# Patient Record
Sex: Male | Born: 1935 | Race: White | Hispanic: No | Smoking: Former smoker
Health system: Southern US, Community
[De-identification: ages and names within clinical notes are randomized; demographics above are authoritative.]

## PROBLEM LIST (undated history)

## (undated) DIAGNOSIS — J189 Pneumonia, unspecified organism: Secondary | ICD-10-CM

## (undated) DIAGNOSIS — Z8489 Family history of other specified conditions: Secondary | ICD-10-CM

## (undated) DIAGNOSIS — L409 Psoriasis, unspecified: Secondary | ICD-10-CM

## (undated) DIAGNOSIS — I5030 Unspecified diastolic (congestive) heart failure: Secondary | ICD-10-CM

## (undated) DIAGNOSIS — H9190 Unspecified hearing loss, unspecified ear: Secondary | ICD-10-CM

## (undated) DIAGNOSIS — J948 Other specified pleural conditions: Secondary | ICD-10-CM

## (undated) DIAGNOSIS — E440 Moderate protein-calorie malnutrition: Secondary | ICD-10-CM

## (undated) DIAGNOSIS — I639 Cerebral infarction, unspecified: Secondary | ICD-10-CM

## (undated) DIAGNOSIS — I739 Peripheral vascular disease, unspecified: Secondary | ICD-10-CM

## (undated) DIAGNOSIS — J449 Chronic obstructive pulmonary disease, unspecified: Secondary | ICD-10-CM

## (undated) DIAGNOSIS — N400 Enlarged prostate without lower urinary tract symptoms: Secondary | ICD-10-CM

## (undated) DIAGNOSIS — I10 Essential (primary) hypertension: Secondary | ICD-10-CM

## (undated) DIAGNOSIS — F329 Major depressive disorder, single episode, unspecified: Secondary | ICD-10-CM

## (undated) DIAGNOSIS — F32A Depression, unspecified: Secondary | ICD-10-CM

## (undated) DIAGNOSIS — I509 Heart failure, unspecified: Secondary | ICD-10-CM

## (undated) DIAGNOSIS — I951 Orthostatic hypotension: Secondary | ICD-10-CM

## (undated) DIAGNOSIS — E785 Hyperlipidemia, unspecified: Secondary | ICD-10-CM

## (undated) HISTORY — DX: Moderate protein-calorie malnutrition: E44.0

## (undated) HISTORY — DX: Heart failure, unspecified: I50.9

## (undated) HISTORY — DX: Other specified pleural conditions: J94.8

## (undated) HISTORY — DX: Chronic obstructive pulmonary disease, unspecified: J44.9

## (undated) HISTORY — PX: HERNIA REPAIR: SHX51

## (undated) HISTORY — DX: Peripheral vascular disease, unspecified: I73.9

## (undated) HISTORY — DX: Hyperlipidemia, unspecified: E78.5

## (undated) HISTORY — DX: Cerebral infarction, unspecified: I63.9

---

## 2005-10-25 ENCOUNTER — Ambulatory Visit: Payer: Self-pay | Admitting: Internal Medicine

## 2005-12-05 ENCOUNTER — Ambulatory Visit: Payer: Self-pay | Admitting: Vascular Surgery

## 2010-04-13 ENCOUNTER — Observation Stay: Payer: Self-pay | Admitting: Internal Medicine

## 2013-01-02 ENCOUNTER — Ambulatory Visit: Payer: Self-pay | Admitting: Ophthalmology

## 2013-01-02 LAB — CREATININE, SERUM
Creatinine: 0.84 mg/dL (ref 0.60–1.30)
EGFR (Non-African Amer.): >60

## 2017-06-03 DIAGNOSIS — F32A Depression, unspecified: Secondary | ICD-10-CM | POA: Insufficient documentation

## 2017-06-03 DIAGNOSIS — L409 Psoriasis, unspecified: Secondary | ICD-10-CM | POA: Insufficient documentation

## 2017-06-03 DIAGNOSIS — F329 Major depressive disorder, single episode, unspecified: Secondary | ICD-10-CM | POA: Insufficient documentation

## 2017-06-03 DIAGNOSIS — R350 Frequency of micturition: Secondary | ICD-10-CM

## 2017-06-03 DIAGNOSIS — K591 Functional diarrhea: Secondary | ICD-10-CM | POA: Insufficient documentation

## 2017-06-03 DIAGNOSIS — N401 Enlarged prostate with lower urinary tract symptoms: Secondary | ICD-10-CM | POA: Insufficient documentation

## 2017-08-14 ENCOUNTER — Emergency Department: Payer: Medicare PPO

## 2017-08-14 ENCOUNTER — Inpatient Hospital Stay: Payer: Medicare PPO

## 2017-08-14 ENCOUNTER — Inpatient Hospital Stay
Admission: EM | Admit: 2017-08-14 | Discharge: 2017-08-15 | DRG: 066 | Disposition: A | Discharge status: Home / Self Care | Payer: Medicare PPO | Attending: Internal Medicine | Admitting: Internal Medicine

## 2017-08-14 ENCOUNTER — Inpatient Hospital Stay (HOSPITAL_COMMUNITY)
Admit: 2017-08-14 | Discharge: 2017-08-14 | Disposition: A | Discharge status: Home / Self Care | Payer: Medicare PPO | Attending: Internal Medicine | Admitting: Internal Medicine

## 2017-08-14 ENCOUNTER — Other Ambulatory Visit: Payer: Self-pay

## 2017-08-14 DIAGNOSIS — I351 Nonrheumatic aortic (valve) insufficiency: Secondary | ICD-10-CM | POA: Diagnosis not present

## 2017-08-14 DIAGNOSIS — Z823 Family history of stroke: Secondary | ICD-10-CM | POA: Diagnosis not present

## 2017-08-14 DIAGNOSIS — I639 Cerebral infarction, unspecified: Secondary | ICD-10-CM | POA: Diagnosis present

## 2017-08-14 DIAGNOSIS — N400 Enlarged prostate without lower urinary tract symptoms: Secondary | ICD-10-CM | POA: Diagnosis present

## 2017-08-14 DIAGNOSIS — I679 Cerebrovascular disease, unspecified: Secondary | ICD-10-CM

## 2017-08-14 DIAGNOSIS — R29810 Facial weakness: Secondary | ICD-10-CM | POA: Diagnosis present

## 2017-08-14 DIAGNOSIS — I63232 Cerebral infarction due to unspecified occlusion or stenosis of left carotid arteries: Secondary | ICD-10-CM | POA: Diagnosis present

## 2017-08-14 DIAGNOSIS — L409 Psoriasis, unspecified: Secondary | ICD-10-CM | POA: Diagnosis present

## 2017-08-14 DIAGNOSIS — R4701 Aphasia: Secondary | ICD-10-CM | POA: Diagnosis present

## 2017-08-14 DIAGNOSIS — Z87891 Personal history of nicotine dependence: Secondary | ICD-10-CM | POA: Diagnosis not present

## 2017-08-14 DIAGNOSIS — I10 Essential (primary) hypertension: Secondary | ICD-10-CM | POA: Diagnosis present

## 2017-08-14 DIAGNOSIS — I6789 Other cerebrovascular disease: Secondary | ICD-10-CM | POA: Diagnosis not present

## 2017-08-14 DIAGNOSIS — E785 Hyperlipidemia, unspecified: Secondary | ICD-10-CM | POA: Diagnosis not present

## 2017-08-14 DIAGNOSIS — R29702 NIHSS score 2: Secondary | ICD-10-CM | POA: Diagnosis present

## 2017-08-14 DIAGNOSIS — R471 Dysarthria and anarthria: Secondary | ICD-10-CM | POA: Diagnosis present

## 2017-08-14 DIAGNOSIS — I6522 Occlusion and stenosis of left carotid artery: Secondary | ICD-10-CM | POA: Diagnosis not present

## 2017-08-14 HISTORY — DX: Benign prostatic hyperplasia without lower urinary tract symptoms: N40.0

## 2017-08-14 HISTORY — DX: Cerebral infarction, unspecified: I63.9

## 2017-08-14 HISTORY — DX: Psoriasis, unspecified: L40.9

## 2017-08-14 HISTORY — DX: Essential (primary) hypertension: I10

## 2017-08-14 LAB — PROTIME-INR
INR: 1.08
Prothrombin Time: 13.9 seconds (ref 11.4–15.2)

## 2017-08-14 LAB — COMPREHENSIVE METABOLIC PANEL
ALK PHOS: 88 U/L (ref 38–126)
ALT: 8 U/L — AB (ref 17–63)
AST: 13 U/L — AB (ref 15–41)
Albumin: 3.8 g/dL (ref 3.5–5.0)
Anion gap: 7 (ref 5–15)
BILIRUBIN TOTAL: 0.4 mg/dL (ref 0.3–1.2)
BUN: 17 mg/dL (ref 6–20)
CO2: 25 mmol/L (ref 22–32)
CREATININE: 0.86 mg/dL (ref 0.61–1.24)
Calcium: 8.9 mg/dL (ref 8.9–10.3)
Chloride: 104 mmol/L (ref 101–111)
GFR calc Af Amer: >60 mL/min (ref >60)
Glucose, Bld: 119 mg/dL — ABNORMAL HIGH (ref 65–99)
Potassium: 3.5 mmol/L (ref 3.5–5.1)
Sodium: 136 mmol/L (ref 135–145)
TOTAL PROTEIN: 7.6 g/dL (ref 6.5–8.1)

## 2017-08-14 LAB — URINE DRUG SCREEN, QUALITATIVE (ARMC ONLY)
Amphetamines, Ur Screen: NOT DETECTED
BENZODIAZEPINE, UR SCRN: NOT DETECTED
Barbiturates, Ur Screen: NOT DETECTED
CANNABINOID 50 NG, UR ~~LOC~~: NOT DETECTED
Cocaine Metabolite,Ur ~~LOC~~: NOT DETECTED
MDMA (Ecstasy)Ur Screen: NOT DETECTED
Methadone Scn, Ur: NOT DETECTED
Opiate, Ur Screen: NOT DETECTED
PHENCYCLIDINE (PCP) UR S: NOT DETECTED
Tricyclic, Ur Screen: NOT DETECTED

## 2017-08-14 LAB — DIFFERENTIAL
BASOS ABS: 0.1 10*3/uL (ref 0–0.1)
Basophils Relative: 1 %
EOS ABS: 0.1 10*3/uL (ref 0–0.7)
Eosinophils Relative: 2 %
LYMPHS ABS: 0.8 10*3/uL — AB (ref 1.0–3.6)
Lymphocytes Relative: 12 %
MONOS PCT: 9 %
Monocytes Absolute: 0.6 10*3/uL (ref 0.2–1.0)
NEUTROS ABS: 5.3 10*3/uL (ref 1.4–6.5)
Neutrophils Relative %: 76 %

## 2017-08-14 LAB — URINALYSIS, ROUTINE W REFLEX MICROSCOPIC
BILIRUBIN URINE: NEGATIVE
GLUCOSE, UA: NEGATIVE mg/dL
HGB URINE DIPSTICK: NEGATIVE
Ketones, ur: NEGATIVE mg/dL
Leukocytes, UA: NEGATIVE
Nitrite: NEGATIVE
PROTEIN: NEGATIVE mg/dL
SPECIFIC GRAVITY, URINE: 1.017 (ref 1.005–1.030)
pH: 6 (ref 5.0–8.0)

## 2017-08-14 LAB — TROPONIN I

## 2017-08-14 LAB — CBC
HEMATOCRIT: 36.9 % — AB (ref 40.0–52.0)
HEMOGLOBIN: 12.4 g/dL — AB (ref 13.0–18.0)
MCH: 30.6 pg (ref 26.0–34.0)
MCHC: 33.7 g/dL (ref 32.0–36.0)
MCV: 90.8 fL (ref 80.0–100.0)
Platelets: 296 10*3/uL (ref 150–440)
RBC: 4.06 MIL/uL — ABNORMAL LOW (ref 4.40–5.90)
RDW: 16.1 % — ABNORMAL HIGH (ref 11.5–14.5)
WBC: 6.9 10*3/uL (ref 3.8–10.6)

## 2017-08-14 LAB — APTT: APTT: 42 s — AB (ref 24–36)

## 2017-08-14 LAB — ETHANOL

## 2017-08-14 MED ORDER — ONDANSETRON HCL 4 MG/2ML IJ SOLN: 4.0000 mg | Freq: Four times a day (QID) | INTRAMUSCULAR | Status: DC | PRN

## 2017-08-14 MED ORDER — SODIUM CHLORIDE 0.9 % IV SOLN: 250.0000 mL | INTRAVENOUS | Status: DC | PRN

## 2017-08-14 MED ORDER — ASPIRIN 81 MG PO CHEW
324.0000 mg | CHEWABLE_TABLET | Freq: Once | ORAL | Status: AC
Start: 2017-08-14 — End: 2017-08-14
  Administered 2017-08-14: 324 mg via ORAL
  Filled 2017-08-14: qty 4

## 2017-08-14 MED ORDER — ENOXAPARIN SODIUM 40 MG/0.4ML ~~LOC~~ SOLN
40.0000 mg | SUBCUTANEOUS | Status: DC
  Filled 2017-08-14: qty 0.4

## 2017-08-14 MED ORDER — POLYETHYLENE GLYCOL 3350 17 G PO PACK: 17.0000 g | PACK | Freq: Every day | ORAL | Status: DC | PRN

## 2017-08-14 MED ORDER — HYDRALAZINE HCL 20 MG/ML IJ SOLN: 10.0000 mg | Freq: Four times a day (QID) | INTRAMUSCULAR | Status: DC | PRN

## 2017-08-14 MED ORDER — ACETAMINOPHEN 650 MG RE SUPP: 650.0000 mg | Freq: Four times a day (QID) | RECTAL | Status: DC | PRN

## 2017-08-14 MED ORDER — SODIUM CHLORIDE 0.9% FLUSH
3.0000 mL | Freq: Two times a day (BID) | INTRAVENOUS | Status: DC
  Administered 2017-08-14 – 2017-08-15 (×3): 3 mL via INTRAVENOUS

## 2017-08-14 MED ORDER — ASPIRIN EC 81 MG PO TBEC
81.0000 mg | DELAYED_RELEASE_TABLET | Freq: Every day | ORAL | Status: DC
  Administered 2017-08-15: 09:00:00 81 mg via ORAL
  Filled 2017-08-14: qty 1

## 2017-08-14 MED ORDER — ACETAMINOPHEN 325 MG PO TABS: 650.0000 mg | ORAL_TABLET | Freq: Four times a day (QID) | ORAL | Status: DC | PRN

## 2017-08-14 MED ORDER — ONDANSETRON HCL 4 MG PO TABS: 4.0000 mg | ORAL_TABLET | Freq: Four times a day (QID) | ORAL | Status: DC | PRN

## 2017-08-14 MED ORDER — ATORVASTATIN CALCIUM 20 MG PO TABS
40.0000 mg | ORAL_TABLET | Freq: Every day | ORAL | Status: DC
Start: 2017-08-14 — End: 2017-08-15
  Administered 2017-08-14 – 2017-08-15 (×2): 40 mg via ORAL
  Filled 2017-08-14 (×2): qty 2

## 2017-08-14 MED ORDER — ALBUTEROL SULFATE (2.5 MG/3ML) 0.083% IN NEBU: 2.5000 mg | INHALATION_SOLUTION | RESPIRATORY_TRACT | Status: DC | PRN

## 2017-08-14 MED ORDER — SODIUM CHLORIDE 0.9% FLUSH: 3.0000 mL | INTRAVENOUS | Status: DC | PRN

## 2017-08-14 NOTE — ED Provider Notes (Signed)
[facility] Emergency Department Provider Note       Time seen: ----------------------------------------- 7:25 AM on 08/14/2017 -----------------------------------------   I have reviewed the triage vital signs and the nursing notes.  HISTORY   Chief Complaint Aphasia   HPI Martin Bean is a 83 y.o. male with a history of BPH who presents to the ED for not feeling well with slurred speech and some facial drooping.  Patient states he has had difficulty tying his shoes.  He denies any numbness.  Patient has never had a problem like this before.  He notes he went to bed about 1030 last night feeling normal.  When he woke up he was having difficulty talking and family noticed right-sided facial droop.  Past Medical History:  Diagnosis Date  . Prostate enlargement     There are no active problems to display for this patient.   Past Surgical History:  Procedure Laterality Date  . HERNIA REPAIR      Allergies Patient has no allergy information on record.  Social History Social History   Tobacco Use  . Smoking status: Former Games developermoker  . Smokeless tobacco: Never Used  Substance Use Topics  . Alcohol use: No    Frequency: Never  . Drug use: No    Review of Systems Constitutional: Negative for fever. Cardiovascular: Negative for chest pain. Respiratory: Negative for shortness of breath. Gastrointestinal: Negative for abdominal pain, vomiting and diarrhea. Musculoskeletal: Negative for back pain. Skin: Negative for rash. Neurological: Positive for facial droop and difficulty speaking  All systems negative/normal/unremarkable except as stated in the HPI  ____________________________________________   PHYSICAL EXAM:  VITAL SIGNS: ED Triage Vitals  Enc Vitals Group     BP 08/14/17 0719 (!) 195/84     Pulse Rate 08/14/17 0719 64     Resp 08/14/17 0719 17     Temp 08/14/17 0722 (!) 97.4 F (36.3 C)     Temp Source 08/14/17 0719 Oral   SpO2 08/14/17 0719 100 %     Weight 08/14/17 0719 150 lb (68 kg)     Height 08/14/17 0719 5\' 11"  (1.803 m)     Head Circumference --      Peak Flow --      Pain Score --      Pain Loc --      Pain Edu? --      Excl. in GC? --     Constitutional: Alert and oriented. Well appearing and in no distress. Eyes: Conjunctivae are normal. Normal extraocular movements. ENT   Head: Normocephalic and atraumatic.   Nose: No congestion/rhinnorhea.   Mouth/Throat: Mucous membranes are moist.   Neck: No stridor. Cardiovascular: Normal rate, regular rhythm. No murmurs, rubs, or gallops. Respiratory: Normal respiratory effort without tachypnea nor retractions. Breath sounds are clear and equal bilaterally. No wheezes/rales/rhonchi. Gastrointestinal: Soft and nontender. Normal bowel sounds Musculoskeletal: Nontender with normal range of motion in extremities. No lower extremity tenderness nor edema. Neurologic: Dysarthria is noted, right-sided facial droop is noted that spares the forehead.  Otherwise strength, sensation and the remainder of the cranial nerves are intact.  Cerebellar function appears to be normal. Skin:  Skin is warm, dry and intact. No rash noted. Psychiatric: Mood and affect are normal. Speech and behavior are normal.  ____________________________________________  EKG: Interpreted by me.  Sinus rhythm the rate of 71 bpm, right bundle branch block, wide QRS, normal QT.  ____________________________________________  ED COURSE:  As part of my medical decision making,  I reviewed the following data within the electronic MEDICAL RECORD NUMBER History obtained from family if available, nursing notes, old chart and ekg, as well as notes from prior ED visits. Patient presented for likely CVA noticed upon awakening this morning, we will assess with labs and imaging as indicated at this time.   Procedures ____________________________________________   LABS (pertinent  positives/negatives)  Labs Reviewed  APTT - Abnormal; Notable for the following components:      Result Value   aPTT 42 (*)    All other components within normal limits  CBC - Abnormal; Notable for the following components:   RBC 4.06 (*)    Hemoglobin 12.4 (*)    HCT 36.9 (*)    RDW 16.1 (*)    All other components within normal limits  DIFFERENTIAL - Abnormal; Notable for the following components:   Lymphs Abs 0.8 (*)    All other components within normal limits  PROTIME-INR  COMPREHENSIVE METABOLIC PANEL  TROPONIN I  URINE DRUG SCREEN, QUALITATIVE ([facility] ONLY)  URINALYSIS, ROUTINE W REFLEX MICROSCOPIC  ETHANOL    RADIOLOGY Images were viewed by me  CT head  IMPRESSION: 1. Progressed small vessel disease since the 2014 brain MRI, including age indeterminate changes in the cerebral white matter and right thalamus. 2. No acute intracranial hemorrhage or acute cortically based infarct identified. ASPECTS is 10. 3. 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 ____________________________________________  CRITICAL CARE Performed by: Emily FilbertWilliams, Jonathan E   Total critical care time: 30 minutes  Critical care time was exclusive of separately billable procedures and treating other patients.  Critical care was necessary to treat or prevent imminent or life-threatening deterioration.  Critical care was time spent personally by me on the following activities: development of treatment plan with patient and/or surrogate as well as nursing, discussions with consultants, evaluation of patient's response to treatment, examination of patient, obtaining history from patient or surrogate, ordering and performing treatments and interventions, ordering and review of laboratory studies, ordering and review of radiographic studies, pulse oximetry and re-evaluation of patient's condition.   DIFFERENTIAL DIAGNOSIS   Ischemic versus hemorrhagic CVA, seizure  FINAL  ASSESSMENT AND PLAN  CVA   Plan: Patient had presented for o'clock symptoms noted upon awakening this morning. Patient's labs were reassuring. Patient's imaging revealed some small vessel disease but no other acute process.  On reexamination his dysarthria is improving but he has persistent right-sided facial drooping.  No other focal neurologic deficits are appreciated.  This likely represents a CVA that has resolved somewhat or a TIA.  He was given aspirin, and he will need a full stroke workup.  I will discussed with the hospitalist for admission.   Emily FilbertWilliams, Jonathan E, MD   Note: This note was generated in part or whole with voice recognition software. Voice recognition is usually quite accurate but there are transcription errors that can and very often do occur. I apologize for any typographical errors that were not detected and corrected.     Emily FilbertWilliams, Jonathan E, MD 08/14/17 [phone]

## 2017-08-14 NOTE — ED Notes (Signed)
Patient denies pain and is resting comfortably.  

## 2017-08-14 NOTE — ED Notes (Signed)
ED Provider at bedside. 

## 2017-08-14 NOTE — ED Notes (Signed)
Patient transported to Ultrasound 

## 2017-08-14 NOTE — ED Notes (Signed)
CODE STROKE CALLED TO 333 

## 2017-08-14 NOTE — ED Notes (Signed)
Patient transported to CT 

## 2017-08-14 NOTE — ED Notes (Signed)
Pt family asked about bringing food to pt. Doctor advised it was ok. RN said pt passed swallow screen.  Lm edt

## 2017-08-14 NOTE — H&P (Signed)
[facility] [place] [facility]   PATIENT NAME: Martin DiceJasper Bean    MR#:  [id number]  DATE OF BIRTH:  03/26/1930  DATE OF ADMISSION:  08/14/2017  PRIMARY CARE PHYSICIAN: [name], [name], MD   REQUESTING/REFERRING PHYSICIAN: Dr. Mayford KnifeWilliams  CHIEF COMPLAINT:   Chief Complaint  Patient presents with  . Aphasia    HISTORY OF PRESENT ILLNESS:  [name] is a 83 y.o. male with a history of BPH who presents to the ED for not feeling well with slurred speech and some facial drooping. No focal weakness  He denies any numbness.  Patient has never had a problem like this before.   Went to bed 1030 PM.  Family noticed right-sided facial droop and difficulty talking on walking up today morning  PAST MEDICAL HISTORY:   Past Medical History:  Diagnosis Date  . HTN (hypertension)   . Prostate enlargement   . Psoriasis     PAST SURGICAL HISTORY:   Past Surgical History:  Procedure Laterality Date  . HERNIA REPAIR      SOCIAL HISTORY:   Social History   Tobacco Use  . Smoking status: Former Games developermoker  . Smokeless tobacco: Never Used  Substance Use Topics  . Alcohol use: No    Frequency: Never    FAMILY HISTORY:   Family History  Problem Relation Age of Onset  . Stroke Mother     DRUG ALLERGIES:  Not on File  REVIEW OF SYSTEMS:   Review of Systems  Constitutional: Positive for malaise/fatigue. Negative for chills, fever and weight loss.  HENT: Negative for hearing loss and nosebleeds.   Eyes: Negative for blurred vision, double vision and pain.  Respiratory: Negative for cough, hemoptysis, sputum production, shortness of breath and wheezing.   Cardiovascular: Negative for chest pain, palpitations, orthopnea and leg swelling.  Gastrointestinal: Negative for abdominal pain, constipation, diarrhea, nausea and vomiting.  Genitourinary: Negative for dysuria and hematuria.  Musculoskeletal: Negative for back pain, falls and myalgias.  Skin: Negative  for rash.  Neurological: Positive for speech change and weakness. Negative for dizziness, tremors, sensory change, focal weakness, seizures and headaches.  Endo/Heme/Allergies: Does not bruise/bleed easily.  Psychiatric/Behavioral: Negative for depression and memory loss. The patient is not nervous/anxious.     MEDICATIONS AT HOME:   Prior to Admission medications   Medication Sig Start Date End Date Taking? Authorizing Provider  Apremilast (OTEZLA) 30 MG TABS Take 1 tablet by mouth 2 (two) times daily.   Yes [provider]  sertraline (ZOLOFT) 50 MG tablet Take 1 tablet by mouth daily. 08/02/17  Yes [provider]  tamsulosin (FLOMAX) 0.4 MG CAPS capsule Take 1 capsule by mouth daily. 06/03/17  Yes [provider]  triamcinolone cream (KENALOG) 0.1 % Apply 0.1 % topically as needed.    [provider]     VITAL SIGNS:  Blood pressure (!) 189/80, pulse 61, temperature (!) 97.4 F (36.3 C), resp. rate (!) 22, height 5\' 11"  (1.803 m), weight 68 kg (150 lb), SpO2 98 %.  PHYSICAL EXAMINATION:  Physical Exam  GENERAL:  83 y.o.-year-old patient lying in the bed with no acute distress.  EYES: Pupils equal, round, reactive to light and accommodation. No scleral icterus. Extraocular muscles intact.  HEENT: Head atraumatic, normocephalic. Oropharynx and nasopharynx clear. No oropharyngeal erythema, moist oral mucosa  NECK:  Supple, no jugular venous distention. No thyroid enlargement, no tenderness.  LUNGS: Normal breath sounds bilaterally, no wheezing, rales, rhonchi. No use of  accessory muscles of respiration.  CARDIOVASCULAR: S1, S2 normal. No murmurs, rubs, or gallops.  ABDOMEN: Soft, nontender, nondistended. Bowel sounds present. No organomegaly or mass.  EXTREMITIES: No pedal edema, cyanosis, or clubbing. + 2 pedal & radial pulses b/l.   NEUROLOGIC: Cranial nerves II through XII are intact except right facial droop. No focal Motor or sensory deficits  appreciated b/l PSYCHIATRIC: The patient is alert and oriented x 3. Good affect.  SKIN: No obvious rash, lesion, or ulcer.   LABORATORY PANEL:   CBC Recent Labs  Lab 08/14/17 0730  WBC 6.9  HGB 12.4*  HCT 36.9*  PLT 296   ------------------------------------------------------------------------------------------------------------------  Chemistries  Recent Labs  Lab 08/14/17 0730  NA 136  K 3.5  CL 104  CO2 25  GLUCOSE 119*  BUN 17  CREATININE 0.86  CALCIUM 8.9  AST 13*  ALT 8*  ALKPHOS 88  BILITOT 0.4   ------------------------------------------------------------------------------------------------------------------  Cardiac Enzymes Recent Labs  Lab 08/14/17 0730  TROPONINI <0.03   ------------------------------------------------------------------------------------------------------------------  RADIOLOGY:  Ct Head Code Stroke Wo Contrast  Addendum Date: 08/14/2017   ADDENDUM REPORT: 08/14/2017 08:05 ADDENDUM: Study discussed by telephone with Dr. Daryel NovemberJONATHAN WILLIAMS on 08/14/2017 at 0804 hours. Electronically Signed   By: Odessa FlemingH  Hall M.D.   On: 08/14/2017 08:05   Result Date: 08/14/2017 CLINICAL DATA:  Code stroke. [age] year old male who woke today with slurred speech and right facial droop. EXAM: CT HEAD WITHOUT CONTRAST TECHNIQUE: Contiguous axial images were obtained from the base of the skull through the vertex without intravenous contrast. COMPARISON:  Brain MRI 01/02/2013. FINDINGS: Brain: Chronic bilateral deep gray matter nuclei lacunar infarcts demonstrated in 2014. New hypodensity in the right thalamus since that time (series 2, image 12). Small chronic infarcts in both cerebellar hemispheres, more so the right, appear stable. No acute intracranial hemorrhage identified. No midline shift, mass effect, or evidence of intracranial mass lesion. No ventriculomegaly. Patchy bilateral cerebral white matter density appears progressed since 2014. No superimposed acute  cortically based infarct identified. Vascular: Calcified atherosclerosis at the skull base. No suspicious intracranial vascular hyperdensity. Skull: Negative. Sinuses/Orbits: Clear. Other: Visualized orbits and scalp soft tissues are within normal limits. ASPECTS [facility]([place] Stroke Program Early CT Score) - Ganglionic level infarction (caudate, lentiform nuclei, internal capsule, insula, M1-M3 cortex): 7 - Supraganglionic infarction (M4-M6 cortex): 3 Total score (0-10 with 10 being normal): 10 IMPRESSION: 1. Progressed small vessel disease since the 2014 brain MRI, including age indeterminate changes in the cerebral white matter and right thalamus. 2. No acute intracranial hemorrhage or acute cortically based infarct identified. ASPECTS is 10. 3. 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 Electronically Signed: By: Odessa FlemingH  Hall M.D. On: 08/14/2017 07:58     IMPRESSION AND PLAN:   * Acute CVA with right facial droop and dysarthria MRI/MRA head ECHO Carotid dopplers ASA, Lipitor Neurology/PT/OT/Speech consult Lipid panel Tele Beyond tPA window  * Hypertension Permissive HTN Not on home meds PRN meds for SBP>220/DBP>120  * DVT prophylaxis Lovenox  All the records are reviewed and case discussed with ED provider. Management plans discussed with the patient, family and they are in agreement.  CODE STATUS: FULL CODE  TOTAL TIME TAKING CARE OF THIS PATIENT: 40 minutes.   Orie FishermanSrikar R Mckennon Zwart M.D on 08/14/2017 at 9:37 AM  Between 7am to 6pm - Pager - [phone]  After 6pm go to [email or web address] - password [email or web address] [facility]  [facility]  Office  [phone]  CC: Primary care physician; [name], [name],  MD  Note: This dictation was prepared with Dragon dictation along with smaller phrase technology. Any transcriptional errors that result from this process are unintentional.

## 2017-08-14 NOTE — Evaluation (Signed)
Physical Therapy Evaluation Patient Details Name: Martin HaverJasper L Stancil MRN: [id number] DOB: 01/07/1930 Today's Date: 08/14/2017   History of Present Illness  Pt is an 83 y.o. male presenting to hospital with slurred speech and R facial droop.  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  PMH includes htn, prostate enlargement, hernia repair.  Clinical Impression  Prior to hospital admission, pt was independent with functional mobility.  Pt lives alone in 1 level home with 5-6 steps to enter with B railings.  Currently pt is independent with bed mobility, transfers, gait, and modified independent navigating 6 stairs with railing.  Pt scored 23/24 on DGI balance assessment (pt used railing for stairs) indicating pt is at low risk for falls.  B LE strength, light touch, proprioception, and tone WFL.  With smiling, mild decreased R elevation compared to L side (nursing notified; pt's daughter reports looking normal to her).  No acute PT needs identified during hospitalization or upon hospital discharge.  Will complete pt's current PT order and discharge pt from PT in house.  Please re-consult PT if pt's status changes and acute PT needs are identified.    Follow Up Recommendations No PT follow up    Equipment Recommendations  None recommended by PT    Recommendations for Other Services       Precautions / Restrictions Precautions Precautions: Fall Restrictions Weight Bearing Restrictions: No      Mobility  Bed Mobility Overal bed mobility: Independent             General bed mobility comments: Supine to/from sit without any difficulties.  Transfers Overall transfer level: Independent Equipment used: None             General transfer comment: steady strong transfers  Ambulation/Gait Ambulation/Gait assistance: Independent Ambulation Distance (Feet): 240 Feet Assistive device: None Gait Pattern/deviations: WFL(Within Functional Limits)   Gait  velocity interpretation: at or above normal speed for age/gender General Gait Details: steady ambulation  Stairs Stairs: Yes Stairs assistance: Modified independent (Device/Increase time) Stair Management: One rail Right;Alternating pattern;Forwards Number of Stairs: 6 General stair comments: steady safe stairs navigation  Wheelchair Mobility    Modified Rankin (Stroke Patients Only)       Balance Overall balance assessment: Independent                               Standardized Balance Assessment Standardized Balance Assessment : Dynamic Gait Index   Dynamic Gait Index Level Surface: Normal Change in Gait Speed: Normal Gait with Horizontal Head Turns: Normal Gait with Vertical Head Turns: Normal Gait and Pivot Turn: Normal Step Over Obstacle: Normal Step Around Obstacles: Normal Steps: Mild Impairment Total Score: 23       Pertinent Vitals/Pain Pain Assessment: No/denies pain  Vitals (HR and O2 on room air) stable and WFL throughout treatment session.  BP 164/74 post ambulation (nursing notified).    Home Living Family/patient expects to be discharged to:: Private residence Living Arrangements: Alone   Type of Home: House Home Access: Stairs to enter Entrance Stairs-Rails: Right;Left;Can reach both Entrance Stairs-Number of Steps: 5-6 Home Layout: One level Home Equipment: Grab bars - tub/shower      Prior Function Level of Independence: Independent         Comments: Pt reports stumbling about 1 month ago but no falls.  Independent with ADL's.     Hand Dominance  Extremity/Trunk Assessment   Upper Extremity Assessment Upper Extremity Assessment: Overall WFL for tasks assessed    Lower Extremity Assessment Lower Extremity Assessment: Overall WFL for tasks assessed(B LE strength 4+/5 hip flexion, knee flexion/extension, and DF.  Intact B LE proprioception and light touch.  Good B heel to shin.)    Cervical / Trunk  Assessment Cervical / Trunk Assessment: Normal  Communication   Communication: No difficulties  Cognition Arousal/Alertness: Awake/alert Behavior During Therapy: WFL for tasks assessed/performed Overall Cognitive Status: Within Functional Limits for tasks assessed                                        General Comments   Nursing cleared pt for participation in physical therapy.  Pt agreeable to PT session.  Pt's family member present during session.    Exercises     Assessment/Plan    PT Assessment Patent does not need any further PT services  PT Problem List         PT Treatment Interventions      PT Goals (Current goals can be found in the Care Plan section)  Acute Rehab PT Goals Patient Stated Goal: to go home PT Goal Formulation: With patient Time For Goal Achievement: 08/28/17 Potential to Achieve Goals: Good    Frequency     Barriers to discharge        Co-evaluation               AM-PAC PT "6 Clicks" Daily Activity  Outcome Measure Difficulty turning over in bed (including adjusting bedclothes, sheets and blankets)?: None Difficulty moving from lying on back to sitting on the side of the bed? : None Difficulty sitting down on and standing up from a chair with arms (e.g., wheelchair, bedside commode, etc,.)?: None Help needed moving to and from a bed to chair (including a wheelchair)?: None Help needed walking in hospital room?: None Help needed climbing 3-5 steps with a railing? : None 6 Click Score: 24    End of Session Equipment Utilized During Treatment: Gait belt Activity Tolerance: Patient tolerated treatment well Patient left: in bed;with call bell/phone within reach;with family/visitor present Nurse Communication: Mobility status;Precautions PT Visit Diagnosis: Muscle weakness (generalized) (M62.81);Other abnormalities of gait and mobility (R26.89)    Time: [id number] PT Time Calculation (min) (ACUTE ONLY): 25  min   Charges:   PT Evaluation $PT Eval Low Complexity: 1 Low     PT G Codes:   PT G-Codes **NOT FOR INPATIENT CLASS** Functional Assessment Tool Used: AM-PAC 6 Clicks Basic Mobility Functional Limitation: Mobility: Walking and moving around Mobility: Walking and Moving Around Current Status (M5784(G8978): 0 percent impaired, limited or restricted Mobility: Walking and Moving Around Goal Status (O9629(G8979): 0 percent impaired, limited or restricted Mobility: Walking and Moving Around Discharge Status (B2841(G8980): 0 percent impaired, limited or restricted    Hendricks LimesEmily Marajade Lei, PT 08/14/17, 4:43 PM [phone]

## 2017-08-14 NOTE — ED Triage Notes (Signed)
Pt states when he woke up this morning he didn't feel right, c/o slurred speech with some facial droop . States he had difficulty tying his shoes. Denies numbness. Pt ambulatory to triage without difficulty..Marland Kitchen

## 2017-08-15 ENCOUNTER — Inpatient Hospital Stay: Payer: Medicare PPO

## 2017-08-15 DIAGNOSIS — I639 Cerebral infarction, unspecified: Secondary | ICD-10-CM

## 2017-08-15 DIAGNOSIS — I6522 Occlusion and stenosis of left carotid artery: Secondary | ICD-10-CM

## 2017-08-15 DIAGNOSIS — I6789 Other cerebrovascular disease: Secondary | ICD-10-CM

## 2017-08-15 DIAGNOSIS — I10 Essential (primary) hypertension: Secondary | ICD-10-CM

## 2017-08-15 DIAGNOSIS — E785 Hyperlipidemia, unspecified: Secondary | ICD-10-CM

## 2017-08-15 LAB — LIPID PANEL
Cholesterol: 153 mg/dL (ref 0–200)
HDL: 42 mg/dL (ref >40)
LDL CALC: 100 mg/dL — AB (ref 0–99)
Total CHOL/HDL Ratio: 3.6 RATIO
Triglycerides: 55 mg/dL (ref <150)
VLDL: 11 mg/dL (ref 0–40)

## 2017-08-15 LAB — HEMOGLOBIN A1C
HEMOGLOBIN A1C: 6 % — AB (ref 4.8–5.6)
MEAN PLASMA GLUCOSE: 125.5 mg/dL

## 2017-08-15 LAB — ECHOCARDIOGRAM COMPLETE
Height: 71 in
Weight: 2300.8 oz

## 2017-08-15 MED ORDER — ENSURE ENLIVE PO LIQD: 237.0000 mL | Freq: Two times a day (BID) | ORAL | Status: DC

## 2017-08-15 MED ORDER — IOPAMIDOL (ISOVUE-370) INJECTION 76%
75.0000 mL | Freq: Once | INTRAVENOUS | Status: AC | PRN
Start: 2017-08-15 — End: 2017-08-15
  Administered 2017-08-15: 75 mL via INTRAVENOUS

## 2017-08-15 MED ORDER — CLOPIDOGREL BISULFATE 75 MG PO TABS: 75.0000 mg | ORAL_TABLET | Freq: Every day | ORAL | 0 refills | Status: DC

## 2017-08-15 MED ORDER — ATORVASTATIN CALCIUM 40 MG PO TABS: 40.0000 mg | ORAL_TABLET | Freq: Every day | ORAL | 0 refills | Status: DC

## 2017-08-15 MED ORDER — ASPIRIN 81 MG PO TBEC: 81.0000 mg | DELAYED_RELEASE_TABLET | Freq: Every day | ORAL | Status: DC

## 2017-08-15 MED ORDER — CLOPIDOGREL BISULFATE 75 MG PO TABS
75.0000 mg | ORAL_TABLET | Freq: Every day | ORAL | Status: DC
  Administered 2017-08-15: 18:00:00 75 mg via ORAL
  Filled 2017-08-15: qty 1

## 2017-08-15 NOTE — Progress Notes (Signed)
Pt D/C per patient orders to home with family. Tele removed. IV removed intact. VSS.

## 2017-08-15 NOTE — Progress Notes (Signed)
SLP Cancellation Note  Patient Details Name: Martin Bean MRN: [id number] DOB: 1929-08-25   Cancelled treatment:       Reason Eval/Treat Not Completed: SLP screened, no needs identified, will sign off(chart reviewed; NSG consulted; met w/ pt/family).Pt denied any difficulty swallowing and is currently on a regular diet; chews his foods well d/t edentulous status. He tolerates swallowing pills w/ water per NSG. Pt conversed at conversational level w/out deficits noted; pt and family denied any speech-language deficits and stated he no longer has any facial/lip droop - "it all returned by this time yesterday", per Dtr.   No further skilled ST services indicated as pt appears at his baseline. Pt agreed. NSG to reconsult if any change in status.    Orinda Kenner, MS, CCC-SLP Watson,Katherine 08/15/2017, 9:03 AM

## 2017-08-15 NOTE — Consult Note (Addendum)
Referring Physician: Sudini    Chief Complaint: Difficulty with speech  HPI: Martin Bean is an 83 y.o. male who reports going to bed at baseline on 12/25.  Awakened early on yesterday and noted that he was having trouble with simple tasks such as putting on his shoes, etc.  When he tried to speak with family words were unintelligible.  Patient was brought in for evaluation at that time. Initial NIHSS of 2.    Date last known well: Date: 08/13/2017 Time last known well: Time: 22:30 tPA Given: No: Outside time window  Past Medical History:  Diagnosis Date  . HTN (hypertension)   . Prostate enlargement   . Psoriasis     Past Surgical History:  Procedure Laterality Date  . HERNIA REPAIR      Family History  Problem Relation Age of Onset  . Stroke Mother    Social History:  reports that he has quit smoking. he has never used smokeless tobacco. He reports that he does not drink alcohol or use drugs.  Allergies: Not on File  Medications:  I have reviewed the patient's current medications. Prior to Admission:  Medications Prior to Admission  Medication Sig Dispense Refill Last Dose  . Apremilast (OTEZLA) 30 MG TABS Take 1 tablet by mouth 2 (two) times daily.   08/13/2017 at Unknown time  . sertraline (ZOLOFT) 50 MG tablet Take 1 tablet by mouth daily.   08/12/2017 at Unknown time  . tamsulosin (FLOMAX) 0.4 MG CAPS capsule Take 1 capsule by mouth daily.   08/13/2017 at Unknown time  . triamcinolone cream (KENALOG) 0.1 % Apply 0.1 % topically as needed.   prn at prn   Scheduled: . aspirin EC  81 mg Oral Daily  . atorvastatin  40 mg Oral q1800  . enoxaparin (LOVENOX) injection  40 mg Subcutaneous Q24H  . sodium chloride flush  3 mL Intravenous Q12H    ROS: History obtained from the patient  General ROS: negative for - chills, fatigue, fever, night sweats, weight gain or weight loss Psychological ROS: negative for - behavioral disorder, hallucinations, memory difficulties,  mood swings or suicidal ideation Ophthalmic ROS: negative for - blurry vision, double vision, eye pain or loss of vision ENT ROS: negative for - epistaxis, nasal discharge, oral lesions, sore throat, tinnitus or vertigo Allergy and Immunology ROS: negative for - hives or itchy/watery eyes Hematological and Lymphatic ROS: negative for - bleeding problems, bruising or swollen lymph nodes Endocrine ROS: negative for - galactorrhea, hair pattern changes, polydipsia/polyuria or temperature intolerance Respiratory ROS: negative for - cough, hemoptysis, shortness of breath or wheezing Cardiovascular ROS: negative for - chest pain, dyspnea on exertion, edema or irregular heartbeat Gastrointestinal ROS: negative for - abdominal pain, diarrhea, hematemesis, nausea/vomiting or stool incontinence Genito-Urinary ROS: intermittent hematuria Musculoskeletal ROS: negative for - joint swelling or muscular weakness Neurological ROS: as noted in HPI Dermatological ROS: negative for rash and skin lesion changes  Physical Examination: Blood pressure (!) 182/84, pulse (!) 59, temperature 98.3 F (36.8 C), temperature source Oral, resp. rate 15, height 5\' 11"  (1.803 m), weight 64 kg (141 lb 1 oz), SpO2 96 %.  HEENT-  Normocephalic, no lesions, without obvious abnormality.  Normal external eye and conjunctiva.  Normal TM's bilaterally.  Normal auditory canals and external ears. Normal external nose, mucus membranes and septum.  Normal pharynx. Cardiovascular- S1, S2 normal, pulses palpable throughout   Lungs- chest clear, no wheezing, rales, normal symmetric air entry Abdomen- soft, non-tender; bowel sounds  normal; no masses,  no organomegaly Extremities- no edema Lymph-no adenopathy palpable Musculoskeletal-no joint tenderness, deformity or swelling Skin-warm and dry, no hyperpigmentation, vitiligo, or suspicious lesions  Neurological Examination   Mental Status: Alert, oriented, thought content appropriate.   Speech fluent with some mild word finding difficulties.  Able to follow 3 step commands without difficulty. Cranial Nerves: II: Discs flat bilaterally; Visual fields grossly normal, pupils equal, round, reactive to light and accommodation III,IV, VI: ptosis not present, extra-ocular motions intact bilaterally V,VII: mild right facial droop, facial light touch sensation normal bilaterally VIII: hearing normal bilaterally IX,X: gag reflex present XI: bilateral shoulder shrug XII: midline tongue extension Motor: Right : Upper extremity   5/5    Left:     Upper extremity   5/5  Lower extremity   5/5     Lower extremity   5/5 Tone and bulk:normal tone throughout; no atrophy noted Sensory: Pinprick and light touch intact throughout, bilaterally Deep Tendon Reflexes: 2+ and symmetric with absent AJ's bilaterally Plantars: Right: downgoing   Left: downgoing Cerebellar: Normal finger-to-nose, normal rapid alternating movements and normal heel-to-shin testing bilaterally Gait: not tested due to safety concerns    Laboratory Studies:  Basic Metabolic Panel: Recent Labs  Lab 08/14/17 0730  NA 136  K 3.5  CL 104  CO2 25  GLUCOSE 119*  BUN 17  CREATININE 0.86  CALCIUM 8.9    Liver Function Tests: Recent Labs  Lab 08/14/17 0730  AST 13*  ALT 8*  ALKPHOS 88  BILITOT 0.4  PROT 7.6  ALBUMIN 3.8   No results for input(s): LIPASE, AMYLASE in the last 168 hours. No results for input(s): AMMONIA in the last 168 hours.  CBC: Recent Labs  Lab 08/14/17 0730  WBC 6.9  NEUTROABS 5.3  HGB 12.4*  HCT 36.9*  MCV 90.8  PLT 296    Cardiac Enzymes: Recent Labs  Lab 08/14/17 0730  TROPONINI <0.03    BNP: Invalid input(s): POCBNP  CBG: No results for input(s): GLUCAP in the last 168 hours.  Microbiology: No results found for this or any previous visit.  Coagulation Studies: Recent Labs    08/14/17 0730  LABPROT 13.9  INR 1.08    Urinalysis:  Recent Labs  Lab  08/14/17 0730  COLORURINE YELLOW*  LABSPEC 1.017  PHURINE 6.0  GLUCOSEU NEGATIVE  HGBUR NEGATIVE  BILIRUBINUR NEGATIVE  KETONESUR NEGATIVE  PROTEINUR NEGATIVE  NITRITE NEGATIVE  LEUKOCYTESUR NEGATIVE    Lipid Panel:    Component Value Date/Time   CHOL 153 08/15/2017 0619   TRIG 55 08/15/2017 0619   HDL 42 08/15/2017 0619   CHOLHDL 3.6 08/15/2017 0619   VLDL 11 08/15/2017 0619   LDLCALC 100 (H) 08/15/2017 0619    HgbA1C: No results found for: HGBA1C  Urine Drug Screen:      Component Value Date/Time   LABOPIA NONE DETECTED 08/14/2017 0730   COCAINSCRNUR NONE DETECTED 08/14/2017 0730   LABBENZ NONE DETECTED 08/14/2017 0730   AMPHETMU NONE DETECTED 08/14/2017 0730   THCU NONE DETECTED 08/14/2017 0730   LABBARB NONE DETECTED 08/14/2017 0730    Alcohol Level:  Recent Labs  Lab 08/14/17 0730  ETH <10    Other results: EKG: sinus rhythm at 71 bpm.  Imaging: Mr Shirlee Latch ZO Contrast  Result Date: 08/14/2017 CLINICAL DATA:  83 year old male with ataxia. Woke today with slurred speech and right facial droop small vessel disease evident on head CT today which had progressed compared to a 2014 brain  MRI. EXAM: MRI HEAD WITHOUT CONTRAST MRA HEAD WITHOUT CONTRAST TECHNIQUE: Multiplanar, multiecho pulse sequences of the brain and surrounding structures were obtained without intravenous contrast. Angiographic images of the head were obtained using MRA technique without contrast. COMPARISON:  noncontrast head CT 0744 hours today. Brain MRI 01/02/2013. FINDINGS: MRI HEAD FINDINGS Brain: There is a small focus of cortical restricted diffusion in the left superior frontal gyrus at the motor strip (series 100, image 46 and series 101, image 19). Associated patchy FLAIR hyperintensity (series 13, image 46). Possible subtle cortical restriction also in the left parietal lobe on series 100, image 42. No other convincing restricted diffusion. No contralateral right hemisphere or posterior  fossa diffusion restriction. Chronic lacunar infarcts in the right corona radiata, right basal ganglia and deep white matter capsule, and right thalamus (corresponding to the right thalamic CT finding today). Small chronic infarcts in both cerebellar hemispheres are stable since 2014, more pronounced on the right. Patchy bilateral cerebral white matter T2 and FLAIR hyperintensity has progressed since 2014, including several areas in the right hemisphere which most resemble chronic white matter lacune is a (series 13, image 35). No midline shift, mass effect, evidence of mass lesion, ventriculomegaly, extra-axial collection or acute intracranial hemorrhage. Cervicomedullary junction and pituitary are within normal limits. Vascular: Major intracranial vascular flow voids are stable since 2014. Mild to moderate generalized intracranial artery tortuosity. Skull and upper cervical spine: Degenerative appearing ligamentous hypertrophy about the odontoid. Otherwise negative cervical spine. Normal bone marrow signal. Sinuses/Orbits: Normal orbits soft tissues. Chronic left maxillary sinusitis. Mild ethmoid mucosal thickening is also stable since 2014. Other: Visible internal auditory structures appear normal. Mastoids are stable and well pneumatized. Scalp and face soft tissues appear negative. MRA HEAD FINDINGS Antegrade flow in the posterior circulation. Mildly dominant distal right vertebral artery. Bilateral PICA flow signal appears normal. There is mild distal vertebral artery irregularity but no significant stenosis. Patent vertebrobasilar junction. No basilar stenosis. Patent SCA origins. The left PCA origin arises vertically at the basilar tip and the left P1 segment is tortuous. There is mild bilateral PCA irregularity, but preserved distal PCA flow. A small right posterior communicating artery is evident, the left is diminutive or absent. Antegrade flow in both ICA siphons. Mild to moderate siphon irregularity  maximal in the left vertical petrous segment, but no significant siphon stenosis. Patent carotid termini. Mild motion artifact at the MCA and ACA origins which remain patent. Visible ACA branches are normal aside from tortuosity. The right MCA M1 segment is tortuous. Right MCA bifurcation or trifurcation and visible right MCA branches are within normal limits. The left MCA M1 segment is patent. The left MCA bifurcation appears ectatic but patent. No left MCA branch occlusion is identified. IMPRESSION: 1. Two small acute cortical infarcts are identified in the posterior Left MCA territory, one is at the left motor strip which could explain right-sided weakness. No associated hemorrhage or mass effect. 2. Intracranial MRA reveals anterior and posterior circulation atherosclerosis, but there is no high-grade stenosis and no left MCA branch occlusion is identified. 3. Underlying chronic small vessel disease with progression in the right thalamus and the bilateral cerebral white matter since 2014. Electronically Signed   By: Odessa FlemingH  Hall M.D.   On: 08/14/2017 12:45   Mr Brain Wo Contrast  Result Date: 08/14/2017 CLINICAL DATA:  83 year old male with ataxia. Woke today with slurred speech and right facial droop small vessel disease evident on head CT today which had progressed compared to a 2014 brain  MRI. EXAM: MRI HEAD WITHOUT CONTRAST MRA HEAD WITHOUT CONTRAST TECHNIQUE: Multiplanar, multiecho pulse sequences of the brain and surrounding structures were obtained without intravenous contrast. Angiographic images of the head were obtained using MRA technique without contrast. COMPARISON:  noncontrast head CT 0744 hours today. Brain MRI 01/02/2013. FINDINGS: MRI HEAD FINDINGS Brain: There is a small focus of cortical restricted diffusion in the left superior frontal gyrus at the motor strip (series 100, image 46 and series 101, image 19). Associated patchy FLAIR hyperintensity (series 13, image 46). Possible subtle  cortical restriction also in the left parietal lobe on series 100, image 42. No other convincing restricted diffusion. No contralateral right hemisphere or posterior fossa diffusion restriction. Chronic lacunar infarcts in the right corona radiata, right basal ganglia and deep white matter capsule, and right thalamus (corresponding to the right thalamic CT finding today). Small chronic infarcts in both cerebellar hemispheres are stable since 01-19-13, more pronounced on the right. Patchy bilateral cerebral white matter T2 and FLAIR hyperintensity has progressed since 01/19/2013, including several areas in the right hemisphere which most resemble chronic white matter lacune is a (series 13, image 35). No midline shift, mass effect, evidence of mass lesion, ventriculomegaly, extra-axial collection or acute intracranial hemorrhage. Cervicomedullary junction and pituitary are within normal limits. Vascular: Major intracranial vascular flow voids are stable since 01-19-2013. Mild to moderate generalized intracranial artery tortuosity. Skull and upper cervical spine: Degenerative appearing ligamentous hypertrophy about the odontoid. Otherwise negative cervical spine. Normal bone marrow signal. Sinuses/Orbits: Normal orbits soft tissues. Chronic left maxillary sinusitis. Mild ethmoid mucosal thickening is also stable since 01/19/2013. Other: Visible internal auditory structures appear normal. Mastoids are stable and well pneumatized. Scalp and face soft tissues appear negative. MRA HEAD FINDINGS Antegrade flow in the posterior circulation. Mildly dominant distal right vertebral artery. Bilateral PICA flow signal appears normal. There is mild distal vertebral artery irregularity but no significant stenosis. Patent vertebrobasilar junction. No basilar stenosis. Patent SCA origins. The left PCA origin arises vertically at the basilar tip and the left P1 segment is tortuous. There is mild bilateral PCA irregularity, but preserved distal PCA flow. A  small right posterior communicating artery is evident, the left is diminutive or absent. Antegrade flow in both ICA siphons. Mild to moderate siphon irregularity maximal in the left vertical petrous segment, but no significant siphon stenosis. Patent carotid termini. Mild motion artifact at the MCA and ACA origins which remain patent. Visible ACA branches are normal aside from tortuosity. The right MCA M1 segment is tortuous. Right MCA bifurcation or trifurcation and visible right MCA branches are within normal limits. The left MCA M1 segment is patent. The left MCA bifurcation appears ectatic but patent. No left MCA branch occlusion is identified. IMPRESSION: 1. Two small acute cortical infarcts are identified in the posterior Left MCA territory, one is at the left motor strip which could explain right-sided weakness. No associated hemorrhage or mass effect. 2. Intracranial MRA reveals anterior and posterior circulation atherosclerosis, but there is no high-grade stenosis and no left MCA branch occlusion is identified. 3. Underlying chronic small vessel disease with progression in the right thalamus and the bilateral cerebral white matter since 01/19/2013. Electronically Signed   By: Odessa Fleming M.D.   On: 08/14/2017 12:45   US Carotid Bilateral  Result Date: 08/14/2017 CLINICAL DATA:  Cerebrovascular disease. Right-sided facial droop. Former smoker. EXAM: BILATERAL CAROTID DUPLEX ULTRASOUND TECHNIQUE: Wallace Cullens scale imaging, color Doppler and duplex ultrasound were performed of bilateral carotid and vertebral arteries in  the neck. COMPARISON:  None. FINDINGS: Criteria: Quantification of carotid stenosis is based on velocity parameters that correlate the residual internal carotid diameter with NASCET-based stenosis levels, using the diameter of the distal internal carotid lumen as the denominator for stenosis measurement. The following velocity measurements were obtained: RIGHT ICA:  82/18 cm/sec CCA:  72/10 cm/sec SYSTOLIC  ICA/CCA RATIO:  1.1 DIASTOLIC ICA/CCA RATIO:  1.7 ECA:  145 cm/sec LEFT ICA:  140/27 cm/sec CCA:  87/14 cm/sec SYSTOLIC ICA/CCA RATIO:  1.6 DIASTOLIC ICA/CCA RATIO:  1.9 ECA:  115 cm/sec RIGHT CAROTID ARTERY: There is a moderate amount of atherosclerotic plaque within the right carotid bulb (images 15 and 16), extending to involve the origin and proximal aspects of the right internal carotid artery (image 23), not resulting in elevated peak systolic velocities within the interrogated course the right internal carotid artery to suggest a hemodynamically significant stenosis. RIGHT VERTEBRAL ARTERY:  Antegrade flow LEFT CAROTID ARTERY: There is a minimal amount of intimal wall thickening involving the mid and distal aspects of the left common carotid artery (images 40 and 44). There is a moderate to large amount of atherosclerotic plaque within the left carotid bulb (images 47 and 48), extending to involve the origin and proximal aspects of the left internal carotid artery (image 55), resulting in elevated peak systolic velocities within the proximal and mid aspects of the left internal carotid artery. Greatest acquired peak systolic velocity within mid left ICA measures 140 cm/sec - image 60. LEFT VERTEBRAL ARTERY:  Antegrade Flow IMPRESSION: 1. Moderate to large amount of left-sided atherosclerotic plaque results in elevated peak systolic velocities within the left internal carotid artery compatible with the 50-69% luminal narrowing range. Further evaluation with CTA could be performed as clinically indicated. 2. Moderate amount of right-sided atherosclerotic plaque, not resulting in a hemodynamically significant stenosis. Electronically Signed   By: Simonne Come M.D.   On: 08/14/2017 11:20   Ct Head Code Stroke Wo Contrast  Addendum Date: 08/14/2017   ADDENDUM REPORT: 08/14/2017 08:05 ADDENDUM: Study discussed by telephone with Dr. Daryel November on 08/14/2017 at 0804 hours. Electronically Signed   By: Odessa Fleming M.D.   On: 08/14/2017 08:05   Result Date: 08/14/2017 CLINICAL DATA:  Code stroke. 83 year old male who woke today with slurred speech and right facial droop. EXAM: CT HEAD WITHOUT CONTRAST TECHNIQUE: Contiguous axial images were obtained from the base of the skull through the vertex without intravenous contrast. COMPARISON:  Brain MRI 01/02/2013. FINDINGS: Brain: Chronic bilateral deep gray matter nuclei lacunar infarcts demonstrated in 2014. New hypodensity in the right thalamus since that time (series 2, image 12). Small chronic infarcts in both cerebellar hemispheres, more so the right, appear stable. No acute intracranial hemorrhage identified. No midline shift, mass effect, or evidence of intracranial mass lesion. No ventriculomegaly. Patchy bilateral cerebral white matter density appears progressed since 2014. No superimposed acute cortically based infarct identified. Vascular: Calcified atherosclerosis at the skull base. No suspicious intracranial vascular hyperdensity. Skull: Negative. Sinuses/Orbits: Clear. Other: Visualized orbits and scalp soft tissues are within normal limits. ASPECTS [facility] St[facility] Early CT Score) - Ganglionic level infarction (caudate, lentiform nuclei, internal capsule, insula, M1-M3 cortex): 7 - Supraganglionic infarction (M4-M6 cortex): 3 Total score (0-10 with 10 being normal): 10 IMPRESSION: 1. Progressed small vessel disease since the 2014 brain MRI, including age indeterminate changes in the cerebral white matter and right thalamus. 2. No acute intracranial hemorrhage or acute cortically based infarct identified. ASPECTS is 10. 3. Chronic small  infarcts in the cerebellar hemispheres appear stable. Electronically Signed: By: Odessa FlemingH  Hall M.D. On: 08/14/2017 07:58    Assessment: 83 y.o. male presenting with difficulty with speech and facial droop. Symptoms have improved significantly.  MRI of the brain reviewed and shows two small, acute infarcts in the the  posterior left MCA territory.  Patient on no antiplatelet therapy at home.  Etiology felt to be embolic.  Carotid dopplers shows left ICA stenosis at 50-69% and right less so but with moderate plaque.  LDL 100.    Stroke Risk Factors - hypertension  Plan: 1. HgbA1c 2. CTA of the neck 3. PT consult, OT consult, Speech consult 4. Echocardiogram pending 5. Prophylactic therapy-ASA 81mg  and Plavix 75mg  daily for the next 2 months with change to ASA alone after that time.   6. Telemetry monitoring 7. Frequent neuro checks   Thana FarrLeslie Shanaiya Bene, MD Neurology [phone] 08/15/2017, 11:43 AM

## 2017-08-15 NOTE — Discharge Instructions (Signed)
Heart healthy diet. °Activity as tolerated. °

## 2017-08-15 NOTE — Evaluation (Signed)
Occupational Therapy Evaluation Patient Details Name: Martin HaverJasper L Bean MRN: [id number] DOB: 07/21/1930 Today's Date: 08/15/2017    History of Present Illness Pt is an 83 y.o. male presenting to hospital with slurred speech and R facial droop.  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  PMH includes htn, prostate enlargement, hernia repair.   Clinical Impression   Pt seen for OT evaluation this date. Prior to admission, pt was independent, living alone, no falls in past 12 months. Currently, pt presents at baseline independence with functional mobility and ADL tasks. Family in room endorse pt is "back to normal" in terms of speech/cognition. No strength/sensory/visual/cognitive/coordination deficits noted during assessment. Pt/family verbalized understanding of education provided in signs/symptoms of a stroke and medication management. Encouraged pt/family to speak with RN/MD regarding additional questions about medications and risk factors for stroke. No additional skilled OT needs at this time. Will sign off. RNCM notified of recommendations.    Follow Up Recommendations  No OT follow up    Equipment Recommendations  None recommended by OT    Recommendations for Other Services       Precautions / Restrictions Precautions Precautions: Fall Restrictions Weight Bearing Restrictions: No      Mobility Bed Mobility Overal bed mobility: Independent                Transfers Overall transfer level: Independent Equipment used: None                  Balance Overall balance assessment: Independent                                         ADL either performed or assessed with clinical judgement   ADL Overall ADL's : At baseline;Independent                                             Vision Baseline Vision/History: Wears glasses Wears Glasses: At all times Patient Visual Report: No change from  baseline Vision Assessment?: No apparent visual deficits     Perception     Praxis      Pertinent Vitals/Pain Pain Assessment: No/denies pain     Hand Dominance Right   Extremity/Trunk Assessment Upper Extremity Assessment Upper Extremity Assessment: Overall WFL for tasks assessed(no sensory/coordination/strength deficits)   Lower Extremity Assessment Lower Extremity Assessment: Overall WFL for tasks assessed(no sensory/coordination/strength deficits)   Cervical / Trunk Assessment Cervical / Trunk Assessment: Normal   Communication Communication Communication: No difficulties   Cognition Arousal/Alertness: Awake/alert Behavior During Therapy: WFL for tasks assessed/performed Overall Cognitive Status: Within Functional Limits for tasks assessed                                     General Comments       Exercises Other Exercises Other Exercises: pt/family verbalized understanding of education provided in signs/symptoms of a stroke Other Exercises: pt/family verbalized understanding of education provided in medication mgt   Shoulder Instructions      Home Living Family/patient expects to be discharged to:: Private residence Living Arrangements: Alone   Type of Home: House Home Access: Stairs to enter Entergy CorporationEntrance Stairs-Number of Steps: 5-6 Entrance Stairs-Rails: Right;Left;Can reach  both Home Layout: One level     Bathroom Shower/Tub: Tub/shower unit;Door   Foot LockerBathroom Toilet: Standard     Home Equipment: Grab bars - tub/shower          Prior Functioning/Environment Level of Independence: Independent        Comments: Pt reports stumbling about 1 month ago but no falls.  Independent with ADL's.        OT Problem List:        OT Treatment/Interventions:      OT Goals(Current goals can be found in the care plan section) Acute Rehab OT Goals Patient Stated Goal: to go home OT Goal Formulation: All assessment and education complete, DC  therapy  OT Frequency:     Barriers to D/C:            Co-evaluation              AM-PAC PT "6 Clicks" Daily Activity     Outcome Measure Help from another person eating meals?: None Help from another person taking care of personal grooming?: None Help from another person toileting, which includes using toliet, bedpan, or urinal?: None Help from another person bathing (including washing, rinsing, drying)?: None Help from another person to put on and taking off regular upper body clothing?: None Help from another person to put on and taking off regular lower body clothing?: None 6 Click Score: 24   End of Session    Activity Tolerance: Patient tolerated treatment well Patient left: in bed;with call bell/phone within reach;with family/visitor present;with bed alarm set  OT Visit Diagnosis: Other abnormalities of gait and mobility (R26.89)                Time: [id number] OT Time Calculation (min): 17 min Charges:  OT General Charges $OT Visit: 1 Visit OT Evaluation $OT Eval Low Complexity: 1 Low OT Treatments $Self Care/Home Management : 8-22 mins G-Codes: OT G-codes **NOT FOR INPATIENT CLASS** Functional Assessment Tool Used: Clinical judgement;AM-PAC 6 Clicks Daily Activity Functional Limitation: Self care Self Care Current Status (O1308(G8987): 0 percent impaired, limited or restricted Self Care Goal Status (M5784(G8988): 0 percent impaired, limited or restricted Self Care Discharge Status (O9629(G8989): 0 percent impaired, limited or restricted   Richrd PrimeJamie Stiller, MPH, MS, OTR/L ascom [phone] 08/15/17, 9:47 AM

## 2017-08-15 NOTE — Progress Notes (Signed)
Initial Nutrition Assessment  DOCUMENTATION CODES:   Non-severe (moderate) malnutrition in context of chronic illness  INTERVENTION:  Provide Ensure Enlive po BID, each supplement provides 350 kcal and 20 grams of protein.  NUTRITION DIAGNOSIS:   Moderate Malnutrition related to chronic illness(weight loss after starting on Otezla) as evidenced by moderate fat depletion, mild muscle depletion.  GOAL:   Patient will meet greater than or equal to 90% of their needs  MONITOR:   PO intake, Supplement acceptance, Labs, Weight trends, I & O's  REASON FOR ASSESSMENT:   Malnutrition Screening Tool    ASSESSMENT:   83 year old male with PMHx of HTN, psoriasis, hx hernia repair who presented with difficulty with speech and facial droop, but symptoms have improved. Found to have two small, acute infarcts in posterior left MCA territory.   Patient reports that he has been having weight loss since he started taking Otezla for his psoriasis. He reports he discussed it with his doctor and that is a side effect of the medication. He reports his appetite is unchanged from baseline. He continues to eat well. Patient is eating 85-100% of meals here. Patient is edentulous. He does not have any dentures. He reports he just chooses softer meats and foods he can chew and does not want his diet downgraded.  He reports his UBW was 170 lbs. He has lost 28.9 lbs (17% body weight) over an unknown time period. Patient not sure exactly when he started losing weight and very limited weight history in chart.  Meal Completion: 85-100%  Medications reviewed and none pertinent.  Labs reviewed: Glucose 119.  NUTRITION - FOCUSED PHYSICAL EXAM:    Most Recent Value  Orbital Region  Moderate depletion  Upper Arm Region  Moderate depletion  Thoracic and Lumbar Region  Mild depletion  Buccal Region  Moderate depletion  Temple Region  Mild depletion  Clavicle Bone Region  Mild depletion  Clavicle and Acromion  Bone Region  Mild depletion  Scapular Bone Region  Mild depletion  Dorsal Hand  No depletion  Patellar Region  No depletion  Anterior Thigh Region  No depletion  Posterior Calf Region  No depletion  Edema (RD Assessment)  None  Hair  Reviewed  Eyes  Reviewed  Mouth  Reviewed [edentulous]  Skin  Reviewed  Nails  Reviewed     Diet Order:  Diet Heart Room service appropriate? Yes; Fluid consistency: Thin  EDUCATION NEEDS:   No education needs have been identified at this time  Skin:  Skin Assessment: Reviewed RN Assessment  Last BM:  08/14/2017  Height:   Ht Readings from Last 1 Encounters:  08/14/17 5\' 11"  (1.803 m)    Weight:   Wt Readings from Last 1 Encounters:  08/15/17 141 lb 1 oz (64 kg)    Ideal Body Weight:  78.2 kg  BMI:  Body mass index is 19.67 kg/m.  Estimated Nutritional Needs:   Kcal:  1625-1895 (MSJ x 1.2-1.4)  Protein:  75-90 grams (1.2-1.4 grams/kg)  Fluid:  1.6 L/day (25 mL/kg)  Helane RimaLeanne Quame Spratlin, MS, RD, LDN Office: [phone] Pager: [phone] After Hours/Weekend Pager: [phone]

## 2017-08-15 NOTE — Consult Note (Signed)
[facility] Vascular Consult Note  MRN : [id number]  Martin Bean is a 83 y.o. (14-Apr-1930) male who presents with chief complaint of  Chief Complaint  Patient presents with  . Aphasia   History of Present Illness:  Consulted by Dr. Toney Rakes for left carotid stenosis with CVA.   The patient is an 83 year old male with a past medical history of hypertension and past tobacco use (states he has quit for approximately two years) who presented to the [facility] ED with aphasia, right sided facial droop and difficulty walking.  The patient was asymptomatic on the evening of August 13, 2017 however woke up early the next morning experiencing these symptoms.  At that time, the patient sought medical attention at [facility] ED.  The patient was admitted and started on aspirin, statin and Plavix.  The patient states his symptoms slowly resolved over the next 24 hours.  At this time, he denies any amaurosis fugax, aphasia, or focal neurological deficits.  The patient was asymptomatic before experiencing these initial symptoms.  The patient denies any fever, nausea vomiting.  The patient underwent an ultrasound of the neck on 08/14/17: 1. Moderate to large amount of left-sided atherosclerotic plaque results in elevated peak systolic velocities within the left internal carotid artery compatible with the 50-69% luminal narrowing range. Further evaluation with CTA could be performed as clinically indicated. 2. Moderate amount of right-sided atherosclerotic plaque, not resulting in a hemodynamically significant stenosis.  CTA of the neck on 08/15/17:  1. Atherosclerosis resulting in short segment 50% stenosis LEFT ICA. 2. Severe stenosis LEFT vertebral artery origin. Patent vertebral artery's with mild to moderate stenoses bilateral V2 segments. 3. Debris within bilateral main bronchi consistent with aspiration. Aortic  Atherosclerosis (ICD10-I70.0).  Current Facility-Administered Medications  Medication Dose Route Frequency Provider Last Rate Last Dose  . 0.9 %  sodium chloride infusion  250 mL Intravenous PRN Sudini, Wardell Heath, MD      . acetaminophen (TYLENOL) tablet 650 mg  650 mg Oral Q6H PRN Milagros Loll, MD       Or  . acetaminophen (TYLENOL) suppository 650 mg  650 mg Rectal Q6H PRN Sudini, Srikar, MD      . albuterol (PROVENTIL) (2.5 MG/3ML) 0.083% nebulizer solution 2.5 mg  2.5 mg Nebulization Q2H PRN Sudini, Srikar, MD      . aspirin EC tablet 81 mg  81 mg Oral Daily Milagros Loll, MD   81 mg at 08/15/17 0848  . atorvastatin (LIPITOR) tablet 40 mg  40 mg Oral q1800 Milagros Loll, MD   40 mg at 08/14/17 1710  . clopidogrel (PLAVIX) tablet 75 mg  75 mg Oral Daily Sudini, Srikar, MD      . enoxaparin (LOVENOX) injection 40 mg  40 mg Subcutaneous Q24H Sudini, Wardell Heath, MD      . Melene Muller ON 08/16/2017] feeding supplement (ENSURE ENLIVE) (ENSURE ENLIVE) liquid 237 mL  237 mL Oral BID BM Sudini, Srikar, MD      . hydrALAZINE (APRESOLINE) injection 10 mg  10 mg Intravenous Q6H PRN Sudini, Srikar, MD      . ondansetron (ZOFRAN) tablet 4 mg  4 mg Oral Q6H PRN Sudini, Wardell Heath, MD       Or  . ondansetron (ZOFRAN) injection 4 mg  4 mg Intravenous Q6H PRN Sudini, Srikar, MD      . polyethylene glycol (MIRALAX / GLYCOLAX) packet 17 g  17 g Oral Daily PRN Milagros Loll, MD      .  sodium chloride flush (NS) 0.9 % injection 3 mL  3 mL Intravenous Q12H Milagros LollSudini, Srikar, MD   3 mL at 08/15/17 0848  . sodium chloride flush (NS) 0.9 % injection 3 mL  3 mL Intravenous PRN Milagros LollSudini, Srikar, MD       Past Medical History:  Diagnosis Date  . HTN (hypertension)   . Prostate enlargement   . Psoriasis    Past Surgical History:  Procedure Laterality Date  . HERNIA REPAIR     Social History Social History   Tobacco Use  . Smoking status: Former Games developermoker  . Smokeless tobacco: Never Used  Substance Use Topics  . Alcohol  use: No    Frequency: Never  . Drug use: No   Family History Family History  Problem Relation Age of Onset  . Stroke Mother   Patient denies any family history of peripheral artery disease, bleeding clotting disorders or renal disease.  REVIEW OF SYSTEMS (Negative unless checked)  Constitutional: [] Weight loss  [] Fever  [] Chills Cardiac: [] Chest pain   [] Chest pressure   [] Palpitations   [] Shortness of breath when laying flat   [] Shortness of breath at rest   [] Shortness of breath with exertion. Vascular:  [] Pain in legs with walking   [] Pain in legs at rest   [] Pain in legs when laying flat   [] Claudication   [] Pain in feet when walking  [] Pain in feet at rest  [] Pain in feet when laying flat   [] History of DVT   [] Phlebitis   [] Swelling in legs   [] Varicose veins   [] Non-healing ulcers Pulmonary:   [] Uses home oxygen   [] Productive cough   [] Hemoptysis   [] Wheeze  [] COPD   [] Asthma Neurologic:  [] Dizziness  [] Blackouts   [] Seizures   [x] History of stroke   [x] History of TIA  [x] Aphasia   [] Temporary blindness   [] Dysphagia   [] Weakness or numbness in arms   [] Weakness or numbness in legs Musculoskeletal:  [] Arthritis   [] Joint swelling   [] Joint pain   [] Low back pain Hematologic:  [] Easy bruising  [] Easy bleeding   [] Hypercoagulable state   [] Anemic  [] Hepatitis Gastrointestinal:  [] Blood in stool   [] Vomiting blood  [] Gastroesophageal reflux/heartburn   [] Difficulty swallowing. Genitourinary:  [] Chronic kidney disease   [] Difficult urination  [] Frequent urination  [] Burning with urination   [] Blood in urine Skin:  [] Rashes   [] Ulcers   [] Wounds Psychological:  [] History of anxiety   []  History of major depression.  Physical Examination  Vitals:   08/15/17 0500 08/15/17 0528 08/15/17 0749 08/15/17 1443  BP:  (!) 161/60 (!) 182/84 (!) 157/69  Pulse:  64 (!) 59 62  Resp:  15  14  Temp:  97.7 F (36.5 C) 98.3 F (36.8 C)   TempSrc:  Oral Oral   SpO2:  93% 96% 95%  Weight: 141 lb 1  oz (64 kg)     Height:       Body mass index is 19.67 kg/m. Gen:  WD/WN, NAD Head: [place]/AT, No temporalis wasting. Prominent temp pulse not noted. Ear/Nose/Throat: Hearing grossly intact, nares w/o erythema or drainage, oropharynx w/o Erythema/Exudate Eyes: Sclera non-icteric, conjunctiva clear Neck: Trachea midline.  No JVD.  Left carotid bruit noted on exam. Pulmonary:  Good air movement, respirations not labored, equal bilaterally.  Cardiac: RRR, normal S1, S2. Vascular:  Vessel Right Left  Radial Palpable Palpable  Ulnar Palpable Palpable  Brachial Palpable Palpable  Carotid Palpable, without bruit Palpable, without bruit  Aorta Not palpable  N/A  Femoral Palpable Palpable  Popliteal Palpable Palpable  PT Palpable Palpable  DP Palpable Palpable   Gastrointestinal: soft, non-tender/non-distended. No guarding/reflex.  Musculoskeletal: M/S 5/5 throughout.  Extremities without ischemic changes.  No deformity or atrophy. No edema. Neurologic: Sensation grossly intact in extremities.  Symmetrical.  Speech is fluent. Motor exam as listed above. Psychiatric: Judgment intact, Mood & affect appropriate for pt's clinical situation. Dermatologic: No rashes or ulcers noted.  No cellulitis or open wounds. Lymph : No Cervical, Axillary, or Inguinal lymphadenopathy.  CBC Lab Results  Component Value Date   WBC 6.9 08/14/2017   HGB 12.4 (L) 08/14/2017   HCT 36.9 (L) 08/14/2017   MCV 90.8 08/14/2017   PLT 296 08/14/2017   BMET    Component Value Date/Time   NA 136 08/14/2017 0730   K 3.5 08/14/2017 0730   CL 104 08/14/2017 0730   CO2 25 08/14/2017 0730   GLUCOSE 119 (H) 08/14/2017 0730   BUN 17 08/14/2017 0730   CREATININE 0.86 08/14/2017 0730   CREATININE 0.84 01/02/2013 0923   CALCIUM 8.9 08/14/2017 0730   GFRNONAA >60 08/14/2017 0730   GFRNONAA >60 01/02/2013 0923   GFRAA >60 08/14/2017 0730   GFRAA >60 01/02/2013 0923   Estimated Creatinine Clearance: 56.8 mL/min (by C-G  formula based on SCr of 0.86 mg/dL).  COAG Lab Results  Component Value Date   INR 1.08 08/14/2017   Radiology Ct Angio Neck W Or Wo Contrast  Result Date: 08/15/2017 CLINICAL DATA:  Woke today with speech difficulties and limited functioning. Follow-up stroke. History of hypertension. EXAM: CT ANGIOGRAPHY NECK TECHNIQUE: Multidetector CT imaging of the neck was performed using the standard protocol during bolus administration of intravenous contrast. Multiplanar CT image reconstructions and MIPs were obtained to evaluate the vascular anatomy. Carotid stenosis measurements (when applicable) are obtained utilizing NASCET criteria, using the distal internal carotid diameter as the denominator. CONTRAST:  75mL ISOVUE-370 IOPAMIDOL (ISOVUE-370) INJECTION 76% COMPARISON:  MRA of the head August 14, 2017 and carotid ultrasound August 14, 2017 FINDINGS: AORTIC ARCH: Normal appearance of the thoracic arch, normal branch pattern. Moderate intimal thickening calcific atherosclerosis. The origins of the innominate, left Common carotid artery and subclavian artery are widely patent. RIGHT CAROTID SYSTEM: Common carotid artery is widely patent, mild atherosclerosis. Moderate eccentric calcific atherosclerosis carotid bifurcation without hemodynamically significant stenosis by NASCET criteria. Patent cervical internal carotid artery with mild calcific atherosclerosis. LEFT CAROTID SYSTEM: Mild stenosis are motion artifact LEFT Common carotid artery origin. Mild intimal thickening LEFT Common carotid artery resulting in minimal tandem stenosis. Calcific atherosclerosis resulting in 5 mm segment 50% stenosis by NASCET criteria within 1 cm of the origin. Patent cervical internal carotid artery with mild calcific atherosclerosis. VERTEBRAL ARTERIES:RIGHT vertebral artery is dominant. Severe stenosis LEFT vertebral artery origin due to calcific atherosclerosis. Bilateral vertebral artery's are patent. Moderate intimal  thickening calcific atherosclerosis resulting in tandem mild-to-moderate stenoses bilateral V2 segments. SKELETON: No acute osseous process though bone windows have not been submitted. Patient is edentulous. Nose advanced degenerative changes cervical spine for age. OTHER NECK: Soft tissues of the neck are nonacute though, not tailored for evaluation. UPPER CHEST: Included lung apices are clear. Minimal apical bullous changes. Subcentimeter mediastinal lymph nodes, no specific follow-up. Debris layering in the LEFT greater than RIGHT main bronchus. IMPRESSION: 1. Atherosclerosis resulting in short segment 50% stenosis LEFT ICA. 2. Severe stenosis LEFT vertebral artery origin. Patent vertebral artery's with mild to moderate stenoses bilateral V2 segments. 3. Debris within bilateral  main bronchi consistent with aspiration. Aortic Atherosclerosis (ICD10-I70.0). Electronically Signed   By: Awilda Metro M.D.   On: 08/15/2017 14:08   Mr Maxine Glenn Head Wo Contrast  Result Date: 08/14/2017 CLINICAL DATA:  83 year old male with ataxia. Woke today with slurred speech and right facial droop small vessel disease evident on head CT today which had progressed compared to a 2014 brain MRI. EXAM: MRI HEAD WITHOUT CONTRAST MRA HEAD WITHOUT CONTRAST TECHNIQUE: Multiplanar, multiecho pulse sequences of the brain and surrounding structures were obtained without intravenous contrast. Angiographic images of the head were obtained using MRA technique without contrast. COMPARISON:  noncontrast head CT 0744 hours today. Brain MRI 01/02/2013. FINDINGS: MRI HEAD FINDINGS Brain: There is a small focus of cortical restricted diffusion in the left superior frontal gyrus at the motor strip (series 100, image 46 and series 101, image 19). Associated patchy FLAIR hyperintensity (series 13, image 46). Possible subtle cortical restriction also in the left parietal lobe on series 100, image 42. No other convincing restricted diffusion. No  contralateral right hemisphere or posterior fossa diffusion restriction. Chronic lacunar infarcts in the right corona radiata, right basal ganglia and deep white matter capsule, and right thalamus (corresponding to the right thalamic CT finding today). Small chronic infarcts in both cerebellar hemispheres are stable since 01-20-2013, more pronounced on the right. Patchy bilateral cerebral white matter T2 and FLAIR hyperintensity has progressed since 01/20/2013, including several areas in the right hemisphere which most resemble chronic white matter lacune is a (series 13, image 35). No midline shift, mass effect, evidence of mass lesion, ventriculomegaly, extra-axial collection or acute intracranial hemorrhage. Cervicomedullary junction and pituitary are within normal limits. Vascular: Major intracranial vascular flow voids are stable since Jan 20, 2013. Mild to moderate generalized intracranial artery tortuosity. Skull and upper cervical spine: Degenerative appearing ligamentous hypertrophy about the odontoid. Otherwise negative cervical spine. Normal bone marrow signal. Sinuses/Orbits: Normal orbits soft tissues. Chronic left maxillary sinusitis. Mild ethmoid mucosal thickening is also stable since 2013/01/20. Other: Visible internal auditory structures appear normal. Mastoids are stable and well pneumatized. Scalp and face soft tissues appear negative. MRA HEAD FINDINGS Antegrade flow in the posterior circulation. Mildly dominant distal right vertebral artery. Bilateral PICA flow signal appears normal. There is mild distal vertebral artery irregularity but no significant stenosis. Patent vertebrobasilar junction. No basilar stenosis. Patent SCA origins. The left PCA origin arises vertically at the basilar tip and the left P1 segment is tortuous. There is mild bilateral PCA irregularity, but preserved distal PCA flow. A small right posterior communicating artery is evident, the left is diminutive or absent. Antegrade flow in both ICA  siphons. Mild to moderate siphon irregularity maximal in the left vertical petrous segment, but no significant siphon stenosis. Patent carotid termini. Mild motion artifact at the MCA and ACA origins which remain patent. Visible ACA branches are normal aside from tortuosity. The right MCA M1 segment is tortuous. Right MCA bifurcation or trifurcation and visible right MCA branches are within normal limits. The left MCA M1 segment is patent. The left MCA bifurcation appears ectatic but patent. No left MCA branch occlusion is identified. IMPRESSION: 1. Two small acute cortical infarcts are identified in the posterior Left MCA territory, one is at the left motor strip which could explain right-sided weakness. No associated hemorrhage or mass effect. 2. Intracranial MRA reveals anterior and posterior circulation atherosclerosis, but there is no high-grade stenosis and no left MCA branch occlusion is identified. 3. Underlying chronic small vessel disease with progression in the right  thalamus and the bilateral cerebral white matter since 22-Jan-2013. Electronically Signed   By: Odessa Fleming M.D.   On: 08/14/2017 12:45   Mr Brain Wo Contrast  Result Date: 08/14/2017 CLINICAL DATA:  83 year old male with ataxia. Woke today with slurred speech and right facial droop small vessel disease evident on head CT today which had progressed compared to a 2014 brain MRI. EXAM: MRI HEAD WITHOUT CONTRAST MRA HEAD WITHOUT CONTRAST TECHNIQUE: Multiplanar, multiecho pulse sequences of the brain and surrounding structures were obtained without intravenous contrast. Angiographic images of the head were obtained using MRA technique without contrast. COMPARISON:  noncontrast head CT 0744 hours today. Brain MRI 01/02/2013. FINDINGS: MRI HEAD FINDINGS Brain: There is a small focus of cortical restricted diffusion in the left superior frontal gyrus at the motor strip (series 100, image 46 and series 101, image 19). Associated patchy FLAIR hyperintensity  (series 13, image 46). Possible subtle cortical restriction also in the left parietal lobe on series 100, image 42. No other convincing restricted diffusion. No contralateral right hemisphere or posterior fossa diffusion restriction. Chronic lacunar infarcts in the right corona radiata, right basal ganglia and deep white matter capsule, and right thalamus (corresponding to the right thalamic CT finding today). Small chronic infarcts in both cerebellar hemispheres are stable since 01-22-2013, more pronounced on the right. Patchy bilateral cerebral white matter T2 and FLAIR hyperintensity has progressed since Jan 22, 2013, including several areas in the right hemisphere which most resemble chronic white matter lacune is a (series 13, image 35). No midline shift, mass effect, evidence of mass lesion, ventriculomegaly, extra-axial collection or acute intracranial hemorrhage. Cervicomedullary junction and pituitary are within normal limits. Vascular: Major intracranial vascular flow voids are stable since 01-22-13. Mild to moderate generalized intracranial artery tortuosity. Skull and upper cervical spine: Degenerative appearing ligamentous hypertrophy about the odontoid. Otherwise negative cervical spine. Normal bone marrow signal. Sinuses/Orbits: Normal orbits soft tissues. Chronic left maxillary sinusitis. Mild ethmoid mucosal thickening is also stable since January 22, 2013. Other: Visible internal auditory structures appear normal. Mastoids are stable and well pneumatized. Scalp and face soft tissues appear negative. MRA HEAD FINDINGS Antegrade flow in the posterior circulation. Mildly dominant distal right vertebral artery. Bilateral PICA flow signal appears normal. There is mild distal vertebral artery irregularity but no significant stenosis. Patent vertebrobasilar junction. No basilar stenosis. Patent SCA origins. The left PCA origin arises vertically at the basilar tip and the left P1 segment is tortuous. There is mild bilateral PCA  irregularity, but preserved distal PCA flow. A small right posterior communicating artery is evident, the left is diminutive or absent. Antegrade flow in both ICA siphons. Mild to moderate siphon irregularity maximal in the left vertical petrous segment, but no significant siphon stenosis. Patent carotid termini. Mild motion artifact at the MCA and ACA origins which remain patent. Visible ACA branches are normal aside from tortuosity. The right MCA M1 segment is tortuous. Right MCA bifurcation or trifurcation and visible right MCA branches are within normal limits. The left MCA M1 segment is patent. The left MCA bifurcation appears ectatic but patent. No left MCA branch occlusion is identified. IMPRESSION: 1. Two small acute cortical infarcts are identified in the posterior Left MCA territory, one is at the left motor strip which could explain right-sided weakness. No associated hemorrhage or mass effect. 2. Intracranial MRA reveals anterior and posterior circulation atherosclerosis, but there is no high-grade stenosis and no left MCA branch occlusion is identified. 3. Underlying chronic small vessel disease with progression in the right  thalamus and the bilateral cerebral white matter since 2014. Electronically Signed   By: Odessa Fleming M.D.   On: 08/14/2017 12:45   US Carotid Bilateral  Result Date: 08/14/2017 CLINICAL DATA:  Cerebrovascular disease. Right-sided facial droop. Former smoker. EXAM: BILATERAL CAROTID DUPLEX ULTRASOUND TECHNIQUE: Wallace Cullens scale imaging, color Doppler and duplex ultrasound were performed of bilateral carotid and vertebral arteries in the neck. COMPARISON:  None. FINDINGS: Criteria: Quantification of carotid stenosis is based on velocity parameters that correlate the residual internal carotid diameter with NASCET-based stenosis levels, using the diameter of the distal internal carotid lumen as the denominator for stenosis measurement. The following velocity measurements were obtained: RIGHT  ICA:  82/18 cm/sec CCA:  72/10 cm/sec SYSTOLIC ICA/CCA RATIO:  1.1 DIASTOLIC ICA/CCA RATIO:  1.7 ECA:  145 cm/sec LEFT ICA:  140/27 cm/sec CCA:  87/14 cm/sec SYSTOLIC ICA/CCA RATIO:  1.6 DIASTOLIC ICA/CCA RATIO:  1.9 ECA:  115 cm/sec RIGHT CAROTID ARTERY: There is a moderate amount of atherosclerotic plaque within the right carotid bulb (images 15 and 16), extending to involve the origin and proximal aspects of the right internal carotid artery (image 23), not resulting in elevated peak systolic velocities within the interrogated course the right internal carotid artery to suggest a hemodynamically significant stenosis. RIGHT VERTEBRAL ARTERY:  Antegrade flow LEFT CAROTID ARTERY: There is a minimal amount of intimal wall thickening involving the mid and distal aspects of the left common carotid artery (images 40 and 44). There is a moderate to large amount of atherosclerotic plaque within the left carotid bulb (images 47 and 48), extending to involve the origin and proximal aspects of the left internal carotid artery (image 55), resulting in elevated peak systolic velocities within the proximal and mid aspects of the left internal carotid artery. Greatest acquired peak systolic velocity within mid left ICA measures 140 cm/sec - image 60. LEFT VERTEBRAL ARTERY:  Antegrade Flow IMPRESSION: 1. Moderate to large amount of left-sided atherosclerotic plaque results in elevated peak systolic velocities within the left internal carotid artery compatible with the 50-69% luminal narrowing range. Further evaluation with CTA could be performed as clinically indicated. 2. Moderate amount of right-sided atherosclerotic plaque, not resulting in a hemodynamically significant stenosis. Electronically Signed   By: Simonne Come M.D.   On: 08/14/2017 11:20   Ct Head Code Stroke Wo Contrast  Addendum Date: 08/14/2017   ADDENDUM REPORT: 08/14/2017 08:05 ADDENDUM: Study discussed by telephone with Dr. Daryel November on 08/14/2017  at 0804 hours. Electronically Signed   By: Odessa Fleming M.D.   On: 08/14/2017 08:05   Result Date: 08/14/2017 CLINICAL DATA:  Code stroke. 83 year old male who woke today with slurred speech and right facial droop. EXAM: CT HEAD WITHOUT CONTRAST TECHNIQUE: Contiguous axial images were obtained from the base of the skull through the vertex without intravenous contrast. COMPARISON:  Brain MRI 01/02/2013. FINDINGS: Brain: Chronic bilateral deep gray matter nuclei lacunar infarcts demonstrated in 2014. New hypodensity in the right thalamus since that time (series 2, image 12). Small chronic infarcts in both cerebellar hemispheres, more so the right, appear stable. No acute intracranial hemorrhage identified. No midline shift, mass effect, or evidence of intracranial mass lesion. No ventriculomegaly. Patchy bilateral cerebral white matter density appears progressed since 2014. No superimposed acute cortically based infarct identified. Vascular: Calcified atherosclerosis at the skull base. No suspicious intracranial vascular hyperdensity. Skull: Negative. Sinuses/Orbits: Clear. Other: Visualized orbits and scalp soft tissues are within normal limits. ASPECTS [facility] Stroke Program Early CT Score) - Ganglionic  level infarction (caudate, lentiform nuclei, internal capsule, insula, M1-M3 cortex): 7 - Supraganglionic infarction (M4-M6 cortex): 3 Total score (0-10 with 10 being normal): 10 IMPRESSION: 1. Progressed small vessel disease since the 2014 brain MRI, including age indeterminate changes in the cerebral white matter and right thalamus. 2. No acute intracranial hemorrhage or acute cortically based infarct identified. ASPECTS is 10. 3. 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 Electronically Signed: By: Odessa FlemingH  Hall M.D. On: 08/14/2017 07:58   Assessment/Plan The patient is an 83 year old male with a past medical history of hypertension and past tobacco use who [facility] with aphasia, right facial droop and weakness.  He was found to have a CVA - Stable  1. Carotid stenosis: Patient with non-hemodynamically significant stenosis to the right internal carotid artery.  Patient with approximately 75% stenosis of the left internal carotid artery.  In the setting of a CVA with significant stenosis will plan on a left carotid endarterectomy in approximately 2 weeks.  Images were reviewed with Dr. Wyn Quakerew. The patient will need cardiac clearance.  I had a long discussion with the patient, his daughter and another family member in regard to the procedure, risks and benefits.  We discussed medical management with aspirin, Plavix and statin their importance.  At this time, the patient and his family members would like to proceed. 2. Hypertension: Encouraged good control as its slows the progression of atherosclerotic disease 3. Hyperlipidemia: Encouraged good control as its slows the progression of atherosclerotic disease.  Patient is currently on aspirin, Plavix and a statin.  Discussed with Dr. Weldon Inchesew  Elleah Hemsley A Ahmadou Bolz, PA-C  08/15/2017 4:33 PM  This note was created with Dragon medical transcription system.  Any error is purely unintentional.

## 2017-08-19 ENCOUNTER — Telehealth (INDEPENDENT_AMBULATORY_CARE_PROVIDER_SITE_OTHER): Payer: Self-pay

## 2017-08-19 NOTE — Telephone Encounter (Signed)
Spoke with the patient regarding scheduling him for surgery, and he stated that he had a lot of doctor's appts scheduled and for me to call him back next week to discuss scheduling him for surgery.

## 2017-08-26 NOTE — Discharge Summary (Signed)
[facility] [place] [facility]   PATIENT NAME: Martin Bean    MR#:  [id number]  DATE OF BIRTH:  [birth year]  DATE OF ADMISSION:  08/14/2017 ADMITTING PHYSICIAN: Milagros Loll, MD  DATE OF DISCHARGE: 08/15/2017  7:03 PM  PRIMARY CARE PHYSICIAN: Gracelyn Nurse, MD   ADMISSION DIAGNOSIS:  CVD (cerebrovascular disease) [I67.9] Facial droop [R29.810] Dysarthria [R47.1]  DISCHARGE DIAGNOSIS:  Active Problems:   CVA (cerebral vascular accident) (HCC)   SECONDARY DIAGNOSIS:   Past Medical History:  Diagnosis Date  . HTN (hypertension)   . Prostate enlargement   . Psoriasis      ADMITTING HISTORY  HISTORY OF PRESENT ILLNESS:  Martin Schmieg Kimreyis a 84 y.o.malewith a history of BPHwho presents to the ED for not feeling well with slurred speech and some facial drooping. No focal weakness He denies any numbness.Patient has never had a problem like this before.  Went to bed 1030 PM. Family noticed right-sided facial droop and difficulty talking on walking up today morning     HOSPITAL COURSE:   * Acute CVA with right facial droop and dysarthria MRI/MRA head ECHO- Normal Carotid dopplers with left severe carotid stenosis ASA, Plavix, Lipitor Neurology/PT/OT/Speech consult - symptoms resolve completely and patient returned back to baseline Tele with no afib Beyond tPA window. Seen by neurology  * Left severe IC stenosis Seen by vascular and advised OP f/u in 1-2 weeks for intervention On ASA, Plavix  * Elevated BP without HTN Improving on its own. No meds started. F/U with PCP. Start if needed after 2-3 days  Stable for discharge home    CONSULTS OBTAINED:  Treatment Team:  Thana Farr, MD  DRUG ALLERGIES:  Not on File  DISCHARGE MEDICATIONS:   Allergies as of 08/15/2017   Not on File     Medication List    TAKE these medications   aspirin 81 MG EC tablet Take 1 tablet (81 mg total) by mouth daily.    atorvastatin 40 MG tablet Commonly known as:  LIPITOR Take 1 tablet (40 mg total) by mouth daily at 6 PM.   clopidogrel 75 MG tablet Commonly known as:  PLAVIX Take 1 tablet (75 mg total) by mouth daily.   OTEZLA 30 MG Tabs Generic drug:  Apremilast Take 1 tablet by mouth 2 (two) times daily.   sertraline 50 MG tablet Commonly known as:  ZOLOFT Take 1 tablet by mouth daily.   tamsulosin 0.4 MG Caps capsule Commonly known as:  FLOMAX Take 1 capsule by mouth daily.   triamcinolone cream 0.1 % Commonly known as:  KENALOG Apply 0.1 % topically as needed.       Today   VITAL SIGNS:  Blood pressure (!) 183/74, pulse 63, temperature 98.3 F (36.8 C), temperature source Oral, resp. rate 14, height 5\' 11"  (1.803 m), weight 64 kg (141 lb 1 oz), SpO2 97 %.  I/O:  No intake or output data in the 24 hours ending 08/26/17 1617  PHYSICAL EXAMINATION:  Physical Exam  GENERAL:  84 y.o.-year-old patient lying in the bed with no acute distress.  LUNGS: Normal breath sounds bilaterally, no wheezing, rales,rhonchi or crepitation. No use of accessory muscles of respiration.  CARDIOVASCULAR: S1, S2 normal. No murmurs, rubs, or gallops.  ABDOMEN: Soft, non-tender, non-distended. Bowel sounds present. No organomegaly or mass.  NEUROLOGIC: Moves all 4 extremities. PSYCHIATRIC: The patient is alert and oriented x 3.  SKIN: No obvious rash, lesion, or ulcer.   DATA REVIEW:  CBC No results for input(s): WBC, HGB, HCT, PLT in the last 168 hours.  Chemistries  No results for input(s): NA, K, CL, CO2, GLUCOSE, BUN, CREATININE, CALCIUM, MG, AST, ALT, ALKPHOS, BILITOT in the last 168 hours.  Invalid input(s): GFRCGP  Cardiac Enzymes No results for input(s): TROPONINI in the last 168 hours.  Microbiology Results  No results found for this or any previous visit.  RADIOLOGY:  No results found.  Follow up with PCP in 1 week.  Management plans discussed with the patient, family and  they are in agreement.  CODE STATUS:  Code Status History    Date Active Date Inactive Code Status Order ID Comments User Context   08/14/2017 09:35 08/15/2017 22:14 Full Code [id number]  Milagros LollSudini, Lonie Newsham, MD ED    Advance Directive Documentation     Most Recent Value  Type of Advance Directive  Healthcare Power of Attorney  Pre-existing out of facility DNR order (yellow form or pink MOST form)  No data  "MOST" Form in Place?  No data      TOTAL TIME TAKING CARE OF THIS PATIENT ON DAY OF DISCHARGE: more than 30 minutes.   Molinda BailiffSrikar R Angad Nabers M.D on 08/26/2017 at 4:17 PM  Between 7am to 6pm - Pager - [phone]  After 6pm go to [email or web address] - password [email or web address] [facility]  [facility]  Office  [phone]  CC: Primary care physician; [name], [name], MD  Note: This dictation was prepared with Dragon dictation along with smaller phrase technology. Any transcriptional errors that result from this process are unintentional.

## 2017-09-04 DIAGNOSIS — I6522 Occlusion and stenosis of left carotid artery: Secondary | ICD-10-CM | POA: Insufficient documentation

## 2017-09-10 ENCOUNTER — Encounter (INDEPENDENT_AMBULATORY_CARE_PROVIDER_SITE_OTHER): Payer: Self-pay

## 2017-09-18 ENCOUNTER — Other Ambulatory Visit (INDEPENDENT_AMBULATORY_CARE_PROVIDER_SITE_OTHER): Payer: Self-pay | Admitting: Vascular Surgery

## 2017-09-24 ENCOUNTER — Encounter
Admission: RE | Admit: 2017-09-24 | Discharge: 2017-09-24 | Disposition: A | Discharge status: Home / Self Care | Payer: Medicare PPO | Source: Ambulatory Visit | Attending: Vascular Surgery | Admitting: Vascular Surgery

## 2017-09-24 ENCOUNTER — Other Ambulatory Visit: Payer: Self-pay

## 2017-09-24 DIAGNOSIS — Z01812 Encounter for preprocedural laboratory examination: Secondary | ICD-10-CM | POA: Insufficient documentation

## 2017-09-24 DIAGNOSIS — I639 Cerebral infarction, unspecified: Secondary | ICD-10-CM | POA: Insufficient documentation

## 2017-09-24 HISTORY — DX: Pneumonia, unspecified organism: J18.9

## 2017-09-24 HISTORY — DX: Family history of other specified conditions: Z84.89

## 2017-09-24 LAB — PROTIME-INR
INR: 1.09
Prothrombin Time: 14 seconds (ref 11.4–15.2)

## 2017-09-24 LAB — BASIC METABOLIC PANEL
ANION GAP: 12 (ref 5–15)
BUN: 15 mg/dL (ref 6–20)
CO2: 24 mmol/L (ref 22–32)
Calcium: 9 mg/dL (ref 8.9–10.3)
Chloride: 93 mmol/L — ABNORMAL LOW (ref 101–111)
Creatinine, Ser: 0.99 mg/dL (ref 0.61–1.24)
GFR calc Af Amer: >60 mL/min (ref >60)
Glucose, Bld: 115 mg/dL — ABNORMAL HIGH (ref 65–99)
POTASSIUM: 4 mmol/L (ref 3.5–5.1)
Sodium: 129 mmol/L — ABNORMAL LOW (ref 135–145)

## 2017-09-24 LAB — TYPE AND SCREEN
ABO/RH(D): O POS
ANTIBODY SCREEN: NEGATIVE

## 2017-09-24 LAB — CBC WITH DIFFERENTIAL/PLATELET
BASOS ABS: 0.1 10*3/uL (ref 0–0.1)
BASOS PCT: 1 %
EOS PCT: 3 %
Eosinophils Absolute: 0.2 10*3/uL (ref 0–0.7)
HCT: 36 % — ABNORMAL LOW (ref 40.0–52.0)
Hemoglobin: 11.9 g/dL — ABNORMAL LOW (ref 13.0–18.0)
LYMPHS PCT: 8 %
Lymphs Abs: 0.6 10*3/uL — ABNORMAL LOW (ref 1.0–3.6)
MCH: 29.9 pg (ref 26.0–34.0)
MCHC: 33.2 g/dL (ref 32.0–36.0)
MCV: 90.2 fL (ref 80.0–100.0)
Monocytes Absolute: 0.5 10*3/uL (ref 0.2–1.0)
Monocytes Relative: 7 %
Neutro Abs: 6 10*3/uL (ref 1.4–6.5)
Neutrophils Relative %: 81 %
PLATELETS: 280 10*3/uL (ref 150–440)
RBC: 3.99 MIL/uL — AB (ref 4.40–5.90)
RDW: 15.3 % — ABNORMAL HIGH (ref 11.5–14.5)
WBC: 7.4 10*3/uL (ref 3.8–10.6)

## 2017-09-24 LAB — SURGICAL PCR SCREEN
MRSA, PCR: NEGATIVE
Staphylococcus aureus: NEGATIVE

## 2017-09-24 LAB — APTT: APTT: 43 s — AB (ref 24–36)

## 2017-09-24 NOTE — Patient Instructions (Signed)
Your procedure is scheduled on: Wed. 10/02/17 Report to Day Surgery. To find out your arrival time please call [phone] between 1PM - 3PM on Tues 10/01/17.  Remember: Instructions that are not followed completely may result in serious medical risk, up to and including death, or upon the discretion of your surgeon and anesthesiologist your surgery may need to be rescheduled.     _X__ 1. Do not eat food after midnight the night before your procedure.                 No gum chewing or hard candies. You may drink clear liquids up to 2 hours                 before you are scheduled to arrive for your surgery- DO not drink clear                 liquids within 2 hours of the start of your surgery.                 Clear Liquids include:  water, apple juice without pulp, clear carbohydrate                 drink such as Clearfast of Gartorade, Black Coffee or Tea (Do not add                 anything to coffee or tea).  __X__2.  On the morning of surgery brush your teeth with toothpaste and water, you may rinse your mouth with mouthwash if you wish.  Do not swallow any              toothpaste of mouthwash.     _X__ 3.  No Alcohol for 24 hours before or after surgery.   ___ 4.  Do Not Smoke or use e-cigarettes For 24 Hours Prior to Your Surgery.                 Do not use any chewable tobacco products for at least 6 hours prior to                 surgery.  ____  5.  Bring all medications with you on the day of surgery if instructed.   __x__  6.  Notify your doctor if there is any change in your medical condition      (cold, fever, infections).     Do not wear jewelry, make-up, hairpins, clips or nail polish. Do not wear lotions, powders, or perfumes. You may wear deodorant. Do not shave 48 hours prior to surgery. Men may shave face and neck. Do not bring valuables to the hospital.    [facility] not [facility] for any belongings or valuables.  Contacts, dentures  or bridgework may not be worn into surgery. Leave your suitcase in the car. After surgery it may be brought to your room. For patients admitted to the hospital, discharge time is determined by your treatment team.   Patients discharged the day of surgery will not be allowed to drive home.   Please read over the following fact sheets that you were given:   MRSA Information   __x__ Take these medicines the morning of surgery with A SIP OF WATER:    1. nione  2.   3.   4.  5.  6.  ____ Fleet Enema (as directed)   __x__ Use CHG Soap as directed  ____ Use inhalers on the day of surgery  ____ Stop metformin 2 days prior to surgery    ____ Take 1/2 of usual insulin dose the night before surgery. No insulin the morning          of surgery.   _x___ Stop Plavix on 09/27/17 Friday  Continue Aspirin  ____ Stop Anti-inflammatories on    ____ Stop supplements until after surgery.    ____ Bring C-Pap to the hospital.

## 2017-10-01 MED ORDER — CEFAZOLIN SODIUM-DEXTROSE 2-4 GM/100ML-% IV SOLN
2.0000 g | INTRAVENOUS | Status: AC
  Administered 2017-10-02: 2 g via INTRAVENOUS

## 2017-10-02 ENCOUNTER — Inpatient Hospital Stay: Payer: Medicare PPO | Admitting: Registered Nurse

## 2017-10-02 ENCOUNTER — Inpatient Hospital Stay
Admission: RE | Admit: 2017-10-02 | Discharge: 2017-10-03 | DRG: 039 | Disposition: A | Discharge status: Home / Self Care | Payer: Medicare PPO | Source: Ambulatory Visit | Attending: Vascular Surgery | Admitting: Vascular Surgery

## 2017-10-02 ENCOUNTER — Encounter
Admission: RE | Disposition: A | Discharge status: Home / Self Care | Payer: Self-pay | Source: Ambulatory Visit | Attending: Vascular Surgery

## 2017-10-02 ENCOUNTER — Other Ambulatory Visit: Payer: Self-pay

## 2017-10-02 DIAGNOSIS — Z87891 Personal history of nicotine dependence: Secondary | ICD-10-CM | POA: Diagnosis not present

## 2017-10-02 DIAGNOSIS — Z823 Family history of stroke: Secondary | ICD-10-CM

## 2017-10-02 DIAGNOSIS — I739 Peripheral vascular disease, unspecified: Secondary | ICD-10-CM

## 2017-10-02 DIAGNOSIS — I6522 Occlusion and stenosis of left carotid artery: Principal | ICD-10-CM | POA: Diagnosis present

## 2017-10-02 DIAGNOSIS — M1990 Unspecified osteoarthritis, unspecified site: Secondary | ICD-10-CM | POA: Diagnosis present

## 2017-10-02 DIAGNOSIS — N400 Enlarged prostate without lower urinary tract symptoms: Secondary | ICD-10-CM | POA: Diagnosis present

## 2017-10-02 DIAGNOSIS — Z8673 Personal history of transient ischemic attack (TIA), and cerebral infarction without residual deficits: Secondary | ICD-10-CM

## 2017-10-02 DIAGNOSIS — I779 Disorder of arteries and arterioles, unspecified: Secondary | ICD-10-CM

## 2017-10-02 DIAGNOSIS — I10 Essential (primary) hypertension: Secondary | ICD-10-CM | POA: Diagnosis present

## 2017-10-02 HISTORY — PX: ENDARTERECTOMY: SHX5162

## 2017-10-02 HISTORY — DX: Disorder of arteries and arterioles, unspecified: I77.9

## 2017-10-02 LAB — GLUCOSE, CAPILLARY: Glucose-Capillary: 163 mg/dL — ABNORMAL HIGH (ref 65–99)

## 2017-10-02 LAB — ABO/RH: ABO/RH(D): O POS

## 2017-10-02 SURGERY — ENDARTERECTOMY, CAROTID
Anesthesia: General | Laterality: Left | Wound class: Clean

## 2017-10-02 MED ORDER — ONDANSETRON HCL 4 MG/2ML IJ SOLN: 4.0000 mg | Freq: Four times a day (QID) | INTRAMUSCULAR | Status: DC | PRN

## 2017-10-02 MED ORDER — ATORVASTATIN CALCIUM 20 MG PO TABS
40.0000 mg | ORAL_TABLET | Freq: Every day | ORAL | Status: DC
  Administered 2017-10-02: 40 mg via ORAL
  Filled 2017-10-02: qty 2

## 2017-10-02 MED ORDER — ORAL CARE MOUTH RINSE: 15.0000 mL | Freq: Two times a day (BID) | OROMUCOSAL | Status: DC

## 2017-10-02 MED ORDER — ROCURONIUM BROMIDE 100 MG/10ML IV SOLN
INTRAVENOUS | Status: DC | PRN
  Administered 2017-10-02: 40 mg via INTRAVENOUS

## 2017-10-02 MED ORDER — FENTANYL CITRATE (PF) 100 MCG/2ML IJ SOLN
INTRAMUSCULAR | Status: AC
  Administered 2017-10-02: 25 ug via INTRAVENOUS
  Filled 2017-10-02: qty 2

## 2017-10-02 MED ORDER — DOCUSATE SODIUM 100 MG PO CAPS
100.0000 mg | ORAL_CAPSULE | Freq: Every day | ORAL | Status: DC
  Administered 2017-10-03: 100 mg via ORAL
  Filled 2017-10-02: qty 1

## 2017-10-02 MED ORDER — HEPARIN SODIUM (PORCINE) 10000 UNIT/ML IJ SOLN
INTRAMUSCULAR | Status: AC
  Filled 2017-10-02: qty 1

## 2017-10-02 MED ORDER — PROPOFOL 500 MG/50ML IV EMUL
INTRAVENOUS | Status: AC
  Filled 2017-10-02: qty 100

## 2017-10-02 MED ORDER — CHLORHEXIDINE GLUCONATE CLOTH 2 % EX PADS: 6.0000 | MEDICATED_PAD | Freq: Once | CUTANEOUS | Status: DC

## 2017-10-02 MED ORDER — FAMOTIDINE IN NACL 20-0.9 MG/50ML-% IV SOLN
20.0000 mg | Freq: Two times a day (BID) | INTRAVENOUS | Status: DC
  Administered 2017-10-02 – 2017-10-03 (×3): 20 mg via INTRAVENOUS
  Filled 2017-10-02 (×3): qty 50

## 2017-10-02 MED ORDER — PROMETHAZINE HCL 25 MG/ML IJ SOLN: 6.2500 mg | INTRAMUSCULAR | Status: DC | PRN

## 2017-10-02 MED ORDER — CEFAZOLIN SODIUM-DEXTROSE 2-4 GM/100ML-% IV SOLN
INTRAVENOUS | Status: AC
  Filled 2017-10-02: qty 100

## 2017-10-02 MED ORDER — CLOPIDOGREL BISULFATE 75 MG PO TABS
75.0000 mg | ORAL_TABLET | Freq: Every day | ORAL | Status: DC
  Administered 2017-10-02: 75 mg via ORAL
  Filled 2017-10-02 (×2): qty 1

## 2017-10-02 MED ORDER — HEPARIN SODIUM (PORCINE) 1000 UNIT/ML IJ SOLN
INTRAMUSCULAR | Status: DC | PRN
  Administered 2017-10-02: 5000 [IU] via INTRAVENOUS

## 2017-10-02 MED ORDER — PROPOFOL 10 MG/ML IV BOLUS
INTRAVENOUS | Status: DC | PRN
  Administered 2017-10-02: 100 mg via INTRAVENOUS

## 2017-10-02 MED ORDER — EVICEL 2 ML EX KIT
PACK | CUTANEOUS | Status: AC
  Filled 2017-10-02: qty 1

## 2017-10-02 MED ORDER — CHLORHEXIDINE GLUCONATE 0.12 % MT SOLN
15.0000 mL | Freq: Two times a day (BID) | OROMUCOSAL | Status: DC
Start: 2017-10-02 — End: 2017-10-03
  Administered 2017-10-03: 15 mL via OROMUCOSAL
  Filled 2017-10-02: qty 15

## 2017-10-02 MED ORDER — DEXAMETHASONE SODIUM PHOSPHATE 10 MG/ML IJ SOLN
INTRAMUSCULAR | Status: AC
  Filled 2017-10-02: qty 1

## 2017-10-02 MED ORDER — FAMOTIDINE 20 MG PO TABS
20.0000 mg | ORAL_TABLET | Freq: Once | ORAL | Status: AC
  Administered 2017-10-02: 20 mg via ORAL

## 2017-10-02 MED ORDER — LIDOCAINE HCL 1 % IJ SOLN
INTRAMUSCULAR | Status: DC | PRN
  Administered 2017-10-02: 10 mL

## 2017-10-02 MED ORDER — EPHEDRINE SULFATE 50 MG/ML IJ SOLN
INTRAMUSCULAR | Status: DC | PRN
  Administered 2017-10-02: 10 mg via INTRAVENOUS

## 2017-10-02 MED ORDER — FENTANYL CITRATE (PF) 100 MCG/2ML IJ SOLN
25.0000 ug | INTRAMUSCULAR | Status: DC | PRN
  Administered 2017-10-02 (×3): 25 ug via INTRAVENOUS

## 2017-10-02 MED ORDER — EVICEL 2 ML EX KIT
PACK | CUTANEOUS | Status: DC | PRN
  Administered 2017-10-02: 2 mL

## 2017-10-02 MED ORDER — NITROGLYCERIN IN D5W 200-5 MCG/ML-% IV SOLN: 5.0000 ug/min | INTRAVENOUS | Status: DC

## 2017-10-02 MED ORDER — MAGNESIUM SULFATE 2 GM/50ML IV SOLN: 2.0000 g | Freq: Every day | INTRAVENOUS | Status: DC | PRN

## 2017-10-02 MED ORDER — SODIUM CHLORIDE 0.9 % IV SOLN
INTRAVENOUS | Status: DC | PRN
  Administered 2017-10-02: 20 ug/min via INTRAVENOUS

## 2017-10-02 MED ORDER — SODIUM CHLORIDE 0.9 % IJ SOLN
INTRAMUSCULAR | Status: AC
  Filled 2017-10-02: qty 50

## 2017-10-02 MED ORDER — ONDANSETRON HCL 4 MG/2ML IJ SOLN
INTRAMUSCULAR | Status: AC
  Filled 2017-10-02: qty 2

## 2017-10-02 MED ORDER — HYDROCHLOROTHIAZIDE 12.5 MG PO CAPS
12.5000 mg | ORAL_CAPSULE | Freq: Every day | ORAL | Status: DC
  Administered 2017-10-02 – 2017-10-03 (×2): 12.5 mg via ORAL
  Filled 2017-10-02 (×2): qty 1

## 2017-10-02 MED ORDER — LIDOCAINE HCL (CARDIAC) 20 MG/ML IV SOLN
INTRAVENOUS | Status: DC | PRN
  Administered 2017-10-02: 60 mg via INTRAVENOUS

## 2017-10-02 MED ORDER — MEPERIDINE HCL 50 MG/ML IJ SOLN: 6.2500 mg | INTRAMUSCULAR | Status: DC | PRN

## 2017-10-02 MED ORDER — METOPROLOL TARTRATE 5 MG/5ML IV SOLN: 2.0000 mg | INTRAVENOUS | Status: DC | PRN

## 2017-10-02 MED ORDER — PHENOL 1.4 % MT LIQD
1.0000 | OROMUCOSAL | Status: DC | PRN
  Filled 2017-10-02: qty 177

## 2017-10-02 MED ORDER — HYDRALAZINE HCL 20 MG/ML IJ SOLN
INTRAMUSCULAR | Status: AC
  Administered 2017-10-02: 5 mg via INTRAVENOUS
  Filled 2017-10-02: qty 1

## 2017-10-02 MED ORDER — LIDOCAINE 20MG/ML (2%) 15 ML SYRINGE OPTIME
INTRAMUSCULAR | Status: DC | PRN
  Administered 2017-10-02: 1.5 mg/kg/h via INTRAVENOUS

## 2017-10-02 MED ORDER — ALUM & MAG HYDROXIDE-SIMETH 200-200-20 MG/5ML PO SUSP
15.0000 mL | ORAL | Status: DC | PRN
  Filled 2017-10-02: qty 30

## 2017-10-02 MED ORDER — ONDANSETRON HCL 4 MG/2ML IJ SOLN
INTRAMUSCULAR | Status: DC | PRN
  Administered 2017-10-02: 4 mg via INTRAVENOUS

## 2017-10-02 MED ORDER — REMIFENTANIL HCL 1 MG IV SOLR
INTRAVENOUS | Status: DC | PRN
  Administered 2017-10-02: .1 ug/kg/min via INTRAVENOUS

## 2017-10-02 MED ORDER — POTASSIUM CHLORIDE CRYS ER 20 MEQ PO TBCR: 20.0000 meq | EXTENDED_RELEASE_TABLET | Freq: Every day | ORAL | Status: DC | PRN

## 2017-10-02 MED ORDER — DEXAMETHASONE SODIUM PHOSPHATE 10 MG/ML IJ SOLN
INTRAMUSCULAR | Status: DC | PRN
  Administered 2017-10-02: 5 mg via INTRAVENOUS

## 2017-10-02 MED ORDER — APREMILAST 30 MG PO TABS: 30.0000 mg | ORAL_TABLET | Freq: Every day | ORAL | Status: DC

## 2017-10-02 MED ORDER — OXYCODONE HCL 5 MG PO TABS: 5.0000 mg | ORAL_TABLET | Freq: Once | ORAL | Status: DC | PRN

## 2017-10-02 MED ORDER — GUAIFENESIN-DM 100-10 MG/5ML PO SYRP
15.0000 mL | ORAL_SOLUTION | ORAL | Status: DC | PRN
  Filled 2017-10-02: qty 15

## 2017-10-02 MED ORDER — FENTANYL CITRATE (PF) 100 MCG/2ML IJ SOLN
INTRAMUSCULAR | Status: DC | PRN
  Administered 2017-10-02: 50 ug via INTRAVENOUS

## 2017-10-02 MED ORDER — TAMSULOSIN HCL 0.4 MG PO CAPS
0.4000 mg | ORAL_CAPSULE | Freq: Every day | ORAL | Status: DC
  Administered 2017-10-02: 0.4 mg via ORAL
  Filled 2017-10-02: qty 1

## 2017-10-02 MED ORDER — ACETAMINOPHEN 650 MG RE SUPP: 325.0000 mg | RECTAL | Status: DC | PRN

## 2017-10-02 MED ORDER — OXYCODONE HCL 5 MG/5ML PO SOLN: 5.0000 mg | Freq: Once | ORAL | Status: DC | PRN

## 2017-10-02 MED ORDER — SODIUM CHLORIDE 0.9 % IV SOLN
INTRAVENOUS | Status: DC
  Administered 2017-10-02 – 2017-10-03 (×2): via INTRAVENOUS

## 2017-10-02 MED ORDER — REMIFENTANIL HCL 1 MG IV SOLR
INTRAVENOUS | Status: AC
Start: 2017-10-02 — End: 2017-10-02
  Filled 2017-10-02: qty 1000

## 2017-10-02 MED ORDER — ASPIRIN EC 81 MG PO TBEC
81.0000 mg | DELAYED_RELEASE_TABLET | Freq: Every day | ORAL | Status: DC
  Administered 2017-10-02: 81 mg via ORAL
  Filled 2017-10-02: qty 1

## 2017-10-02 MED ORDER — SODIUM CHLORIDE 0.9 % IV SOLN
1.5000 g | Freq: Two times a day (BID) | INTRAVENOUS | Status: AC
  Administered 2017-10-02 – 2017-10-03 (×2): 1.5 g via INTRAVENOUS
  Filled 2017-10-02 (×2): qty 1.5

## 2017-10-02 MED ORDER — MORPHINE SULFATE (PF) 2 MG/ML IV SOLN: 2.0000 mg | INTRAVENOUS | Status: DC | PRN

## 2017-10-02 MED ORDER — KETAMINE HCL 50 MG/ML IJ SOLN
INTRAMUSCULAR | Status: AC
  Filled 2017-10-02: qty 10

## 2017-10-02 MED ORDER — PROPOFOL 10 MG/ML IV BOLUS
INTRAVENOUS | Status: AC
  Filled 2017-10-02: qty 20

## 2017-10-02 MED ORDER — HYDRALAZINE HCL 20 MG/ML IJ SOLN
5.0000 mg | INTRAMUSCULAR | Status: DC | PRN
  Administered 2017-10-02: 5 mg via INTRAVENOUS
  Filled 2017-10-02 (×2): qty 0.25
  Filled 2017-10-02: qty 1

## 2017-10-02 MED ORDER — LABETALOL HCL 5 MG/ML IV SOLN
10.0000 mg | INTRAVENOUS | Status: DC | PRN
  Filled 2017-10-02: qty 4

## 2017-10-02 MED ORDER — LACTATED RINGERS IV SOLN
INTRAVENOUS | Status: DC
  Administered 2017-10-02: 10:00:00 via INTRAVENOUS

## 2017-10-02 MED ORDER — ESMOLOL HCL-SODIUM CHLORIDE 2000 MG/100ML IV SOLN
25.0000 ug/kg/min | INTRAVENOUS | Status: DC
  Filled 2017-10-02: qty 100

## 2017-10-02 MED ORDER — ACETAMINOPHEN 325 MG PO TABS
325.0000 mg | ORAL_TABLET | ORAL | Status: DC | PRN
  Administered 2017-10-03: 325 mg via ORAL
  Filled 2017-10-02: qty 2

## 2017-10-02 MED ORDER — SODIUM CHLORIDE 0.9 % IV SOLN: 500.0000 mL | Freq: Once | INTRAVENOUS | Status: DC | PRN

## 2017-10-02 MED ORDER — NITROGLYCERIN 0.2 MG/ML ON CALL CATH LAB
INTRAVENOUS | Status: DC | PRN
  Administered 2017-10-02: 40 ug via INTRAVENOUS
  Administered 2017-10-02 (×2): 20 ug via INTRAVENOUS

## 2017-10-02 MED ORDER — ROCURONIUM BROMIDE 50 MG/5ML IV SOLN
INTRAVENOUS | Status: AC
  Filled 2017-10-02: qty 1

## 2017-10-02 MED ORDER — SUGAMMADEX SODIUM 200 MG/2ML IV SOLN
INTRAVENOUS | Status: DC | PRN
  Administered 2017-10-02: 125 mg via INTRAVENOUS

## 2017-10-02 MED ORDER — OXYCODONE-ACETAMINOPHEN 5-325 MG PO TABS
1.0000 | ORAL_TABLET | ORAL | Status: DC | PRN
  Administered 2017-10-02: 2 via ORAL
  Filled 2017-10-02: qty 2

## 2017-10-02 MED ORDER — LIDOCAINE HCL (PF) 1 % IJ SOLN
INTRAMUSCULAR | Status: AC
  Filled 2017-10-02: qty 30

## 2017-10-02 MED ORDER — SUCCINYLCHOLINE CHLORIDE 20 MG/ML IJ SOLN
INTRAMUSCULAR | Status: AC
  Filled 2017-10-02: qty 1

## 2017-10-02 MED ORDER — LIDOCAINE HCL (PF) 2 % IJ SOLN
INTRAMUSCULAR | Status: AC
  Filled 2017-10-02: qty 10

## 2017-10-02 MED ORDER — FENTANYL CITRATE (PF) 100 MCG/2ML IJ SOLN
INTRAMUSCULAR | Status: AC
  Filled 2017-10-02: qty 2

## 2017-10-02 MED ORDER — SERTRALINE HCL 50 MG PO TABS
50.0000 mg | ORAL_TABLET | Freq: Every day | ORAL | Status: DC
  Administered 2017-10-02: 50 mg via ORAL
  Filled 2017-10-02: qty 1

## 2017-10-02 MED ORDER — FAMOTIDINE 20 MG PO TABS
ORAL_TABLET | ORAL | Status: AC
  Administered 2017-10-02: 20 mg via ORAL
  Filled 2017-10-02: qty 1

## 2017-10-02 MED ORDER — HYDROCERIN EX CREA
1.0000 "application " | TOPICAL_CREAM | Freq: Three times a day (TID) | CUTANEOUS | Status: DC | PRN
  Filled 2017-10-02: qty 113

## 2017-10-02 MED ORDER — MIDAZOLAM HCL 2 MG/2ML IJ SOLN
INTRAMUSCULAR | Status: AC
  Filled 2017-10-02: qty 2

## 2017-10-02 SURGICAL SUPPLY — 60 items
BAG DECANTER FOR FLEXI CONT (MISCELLANEOUS) ×2 IMPLANT
BLADE SURG 15 STRL LF DISP TIS (BLADE) ×1 IMPLANT
BLADE SURG 15 STRL SS (BLADE) ×1
BLADE SURG SZ11 CARB STEEL (BLADE) ×2 IMPLANT
BOOT SUTURE AID YELLOW STND (SUTURE) ×2 IMPLANT
BRUSH SCRUB EZ  4% CHG (MISCELLANEOUS) ×1
BRUSH SCRUB EZ 4% CHG (MISCELLANEOUS) ×1 IMPLANT
CANISTER SUCT 1200ML W/VALVE (MISCELLANEOUS) ×2 IMPLANT
DERMABOND ADVANCED (GAUZE/BANDAGES/DRESSINGS) ×1
DERMABOND ADVANCED .7 DNX12 (GAUZE/BANDAGES/DRESSINGS) ×1 IMPLANT
DRAPE INCISE IOBAN 66X45 STRL (DRAPES) ×2 IMPLANT
DRAPE LAPAROTOMY 77X122 PED (DRAPES) ×2 IMPLANT
DRAPE SHEET LG 3/4 BI-LAMINATE (DRAPES) ×2 IMPLANT
DRSG TEGADERM 4X4.75 (GAUZE/BANDAGES/DRESSINGS) IMPLANT
DRSG TELFA 3X8 NADH (GAUZE/BANDAGES/DRESSINGS) IMPLANT
DURAPREP 26ML APPLICATOR (WOUND CARE) ×2 IMPLANT
ELECT CAUTERY BLADE 6.4 (BLADE) ×2 IMPLANT
ELECT REM PT RETURN 9FT ADLT (ELECTROSURGICAL) ×2
ELECTRODE REM PT RTRN 9FT ADLT (ELECTROSURGICAL) ×1 IMPLANT
GLOVE BIO SURGEON STRL SZ7 (GLOVE) ×6 IMPLANT
GLOVE INDICATOR 7.5 STRL GRN (GLOVE) ×2 IMPLANT
GOWN STRL REUS W/ TWL LRG LVL3 (GOWN DISPOSABLE) ×2 IMPLANT
GOWN STRL REUS W/ TWL XL LVL3 (GOWN DISPOSABLE) ×2 IMPLANT
GOWN STRL REUS W/TWL LRG LVL3 (GOWN DISPOSABLE) ×2
GOWN STRL REUS W/TWL XL LVL3 (GOWN DISPOSABLE) ×2
HEMOSTAT SURGICEL 2X3 (HEMOSTASIS) ×2 IMPLANT
IV NS 250ML (IV SOLUTION) ×1
IV NS 250ML BAXH (IV SOLUTION) ×1 IMPLANT
KIT TURNOVER KIT A (KITS) ×2 IMPLANT
LABEL OR SOLS (LABEL) ×2 IMPLANT
LOOP RED MAXI  1X406MM (MISCELLANEOUS) ×2
LOOP VESSEL MAXI 1X406 RED (MISCELLANEOUS) ×2 IMPLANT
LOOP VESSEL MINI 0.8X406 BLUE (MISCELLANEOUS) ×1 IMPLANT
LOOPS BLUE MINI 0.8X406MM (MISCELLANEOUS) ×1
NEEDLE FILTER BLUNT 18X 1/2SAF (NEEDLE) ×1
NEEDLE FILTER BLUNT 18X1 1/2 (NEEDLE) ×1 IMPLANT
NEEDLE HYPO 25X1 1.5 SAFETY (NEEDLE) ×2 IMPLANT
NS IRRIG 1000ML POUR BTL (IV SOLUTION) ×2 IMPLANT
PACK BASIN MAJOR ARMC (MISCELLANEOUS) ×2 IMPLANT
PATCH CAROTID ECM VASC 1X10 (Prosthesis & Implant Heart) ×2 IMPLANT
PENCIL ELECTRO HAND CTR (MISCELLANEOUS) IMPLANT
SHUNT W TPORT 9FR PRUITT F3 (SHUNT) ×2 IMPLANT
SUT MNCRL 4-0 (SUTURE) ×1
SUT MNCRL 4-0 27XMFL (SUTURE) ×1
SUT PROLENE 6 0 BV (SUTURE) ×8 IMPLANT
SUT PROLENE 7 0 BV 1 (SUTURE) ×4 IMPLANT
SUT SILK 2 0 (SUTURE) ×1
SUT SILK 2-0 18XBRD TIE 12 (SUTURE) ×1 IMPLANT
SUT SILK 3 0 (SUTURE) ×1
SUT SILK 3-0 18XBRD TIE 12 (SUTURE) ×1 IMPLANT
SUT SILK 4 0 (SUTURE) ×1
SUT SILK 4-0 18XBRD TIE 12 (SUTURE) ×1 IMPLANT
SUT VIC AB 3-0 SH 27 (SUTURE) ×2
SUT VIC AB 3-0 SH 27X BRD (SUTURE) ×2 IMPLANT
SUTURE MNCRL 4-0 27XMF (SUTURE) ×1 IMPLANT
SYR 10ML LL (SYRINGE) ×4 IMPLANT
SYR 20CC LL (SYRINGE) ×2 IMPLANT
TOWEL OR 17X26 4PK STRL BLUE (TOWEL DISPOSABLE) ×2 IMPLANT
TRAY FOLEY W/METER SILVER 16FR (SET/KITS/TRAYS/PACK) ×2 IMPLANT
TUBING CONNECTING 10 (TUBING) IMPLANT

## 2017-10-02 NOTE — Op Note (Signed)
[place] [facility]   OPERATIVE NOTE  PROCEDURE:   1.  Left carotid endarterectomy with CorMatrix arterial patch reconstruction  PRE-OPERATIVE DIAGNOSIS: 1.  Left carotid stenosis 2. Stroke about 6 weeks ago  POST-OPERATIVE DIAGNOSIS: same as above   SURGEON: Festus BarrenJason Kashmir Leedy, MD  ASSISTANT(S): none  ANESTHESIA: general  ESTIMATED BLOOD LOSS: 25 cc  FINDING(S): 1.  Left carotid plaque.  SPECIMEN(S):  Carotid plaque (sent to Pathology)  INDICATIONS:   Martin Bean is a 84 y.o. male who presents with previous stroke and significant left carotid stenosis.  I discussed with the patient the risks, benefits, and alternatives to carotid endarterectomy.  I discussed the differences between carotid stenting and carotid endarterectomy. I discussed the procedural details of carotid endarterectomy with the patient.  The patient is aware that the risks of carotid endarterectomy include but are not limited to: bleeding, infection, stroke, myocardial infarction, death, cranial nerve injuries both temporary and permanent, neck hematoma, possible airway compromise, labile blood pressure post-operatively, cerebral hyperperfusion syndrome, and possible need for additional interventions in the future. The patient is aware of the risks and agrees to proceed forward with the procedure.  DESCRIPTION: After full informed written consent was obtained from the patient, the patient was brought back to the operating room and placed supine upon the operating table.  Prior to induction, the patient received IV antibiotics.  After obtaining adequate anesthesia, the patient was placed into a modified beach chair position with a shoulder roll in place and the patient's neck slightly hyperextended and rotated away from the surgical site.  The patient was prepped in the standard fashion for a carotid endarterectomy.  I made an incision anterior to the sternocleidomastoid muscle and dissected down through the  subcutaneous tissue.  The platysmas was opened with electrocautery.  Then I dissected down to the internal jugular vein and facial vein.  The facial vein is ligated and divided between 2-0 silk ties.  This was dissected posteriorly until I obtained visualization of the common carotid artery.  This was dissected out and then a vessel loop was placed around the common carotid artery.  I then dissected in a periadventitial fashion along the common carotid artery up to the bifurcation.  I then identified the external carotid artery and the superior thyroid artery.  I placed a vessel loop around the superior thyroid artery, and I also dissected out the external carotid artery and placed a vessel loop around it. In the process of this dissection, the hypoglossal nerve was identified and protected from harm.  I then dissected out the internal carotid artery until I identified an area in the internal carotid artery clearly above the stenosis.  I dissected slightly distal to this area, and placed a vessel loop around the artery.  At this point, we gave the patient 5000 units of intravenous heparin.  After this was allowed to circulate for several minutes, I pulled up control on the vessel loops to clamp the internal carotid artery, external carotid artery, superior thyroid artery, and then the common carotid artery.  I then made an arteriotomy in the common carotid artery with a 11 blade, and extended the arteriotomy with a Potts scissor down into the common carotid artery, then I carried the arteriotomy through the bifurcation into the internal carotid artery until I reached an area that was not diseased.  At this point, I took the [place] shunt that previously been prepared and I inserted it into the internal carotid artery first, and  then into the common carotid artery taking care to flush and de-air prior to release of control. At this point, I started the endarterectomy in the common carotid artery with a  Penfield elevator and carried this dissection down into the common carotid artery circumferentially.  Then I transected the plaque at a segment where it was adherent and transected the plaque with Potts scissors.  I then carried this dissection up into the external carotid artery.  The plaque was extracted by unclamping the external carotid artery and performing an eversion endarterectomy.  The dissection was then carried into the internal carotid artery where a nice feathered end point was created with gentle traction.  I passed the plaque off the field as a specimen. At this point I removed all loose flecks and remaining disease possible.  At this point, I was satisfied that the minimal remaining disease was densely adherent to the wall and wall integrity was intact. The distal endpoint was tacked down with three 7-0 Prolene sutures.  I then fashioned a CorMatrix arterial patch for the artery and sewed it in place with two running stitch of 6-0 Prolene.  I started at the distal endpoint and ran one half the length of the arteriotomy.  I then cut and beveled the patch to an appropriate length to match the arteriotomy.  I started the second 6-0 Prolene at the proximal end point.  The medial suture line was completed and the lateral suture line was run approximately one quarter the length of the arteriotomy.  Prior to completing this patch angioplasty, I removed the shunt first from the internal carotid artery, from which there was excellent backbleeding, and clamped it.  Then I removed the shunt from the common carotid artery, from which there was excellent antegrade bleeding, and then clamped it.  At this point, I allowed the external carotid artery to backbleed, which was excellent.  Then I instilled heparinized saline in this patched artery and then completed the patch angioplasty in the usual fashion.  First, I released the clamp on the external carotid artery, then I released it on the common carotid artery.   After waiting a few seconds, I then released it on the internal carotid artery. Several minutes of pressure were held and 6-0 Prolene patch sutures were used as need for hemostasis.  At this point, I placed Surgicel and Evicel topical hemostatic agents.  There was no more active bleeding in the surgical site.  The sternocleidomastoid space was closed with three interrupted 3-0 Vicryl sutures. I then reapproximated the platysma muscle with a running stitch of 3-0 Vicryl.  The skin was then closed with a running subcuticular 4-0 Monocryl.  The skin was then cleaned, dried and Dermabond was used to reinforce the skin closure.  The patient awakened and was taken to the recovery room in stable condition, following commands and moving all four extremities without any apparent deficits.    COMPLICATIONS: none  CONDITION: stable  Festus Barren  10/02/2017, 12:37 PM    This note was created with Dragon Medical transcription system. Any errors in dictation are purely unintentional.

## 2017-10-02 NOTE — H&P (Signed)
[facility] Admission History & Physical  MRN : [id number]  Martin Bean is a 84 y.o. (1930/05/29) male who presents with chief complaint of No chief complaint on file. Marland Kitchen  History of Present Illness: Patient presents today for left carotid endarterectomy for moderate to high-grade stenosis with stroke approximately 6 weeks ago my interpretation of his CT angiogram demonstrated closer to a 75% stenosis of the left carotid artery and he had an MRI positive for stroke on the left side.  He has basically completely recovered at this point.  Speech issues were his biggest complaints initially and they resolved within a matter of days.  The right-sided weakness is also resolved.  He has had no new focal neurologic symptoms since his event. no fevers or chills.  No other complaints.  Current Facility-Administered Medications  Medication Dose Route Frequency Provider Last Rate Last Dose  . ceFAZolin (ANCEF) 2-4 GM/100ML-% IVPB           . ceFAZolin (ANCEF) IVPB 2g/100 mL premix  2 g Intravenous On Call to OR Stegmayer, Ranae Plumber, PA-C      . Chlorhexidine Gluconate Cloth 2 % PADS 6 each  6 each Topical Once Stegmayer, Kimberly A, PA-C       And  . Chlorhexidine Gluconate Cloth 2 % PADS 6 each  6 each Topical Once Stegmayer, Kimberly A, PA-C      . lactated ringers infusion   Intravenous Continuous Yevette Edwards, MD 50 mL/hr at 10/02/17 0945    . lactated ringers infusion   Intravenous Continuous Yevette Edwards, MD 50 mL/hr at 10/02/17 0945      Past Medical History:  Diagnosis Date  . Family history of adverse reaction to anesthesia   . HTN (hypertension)   . Pneumonia   . Prostate enlargement   . Psoriasis     Past Surgical History:  Procedure Laterality Date  . HERNIA REPAIR      Social History Social History   Tobacco Use  . Smoking status: Former Smoker    Types: Cigarettes    Last attempt to quit: 09/24/2013    Years since quitting: 4.0  . Smokeless  tobacco: Never Used  Substance Use Topics  . Alcohol use: No    Frequency: Never  . Drug use: No    Family History Family History  Problem Relation Age of Onset  . Stroke Mother   No bleeding disorders, clotting disorders, autoimmune diseases, or porphyria  No Known Allergies   REVIEW OF SYSTEMS (Negative unless checked)  Constitutional: [] Weight loss  [] Fever  [] Chills Cardiac: [] Chest pain   [] Chest pressure   [] Palpitations   [] Shortness of breath when laying flat   [] Shortness of breath at rest   [] Shortness of breath with exertion. Vascular:  [] Pain in legs with walking   [] Pain in legs at rest   [] Pain in legs when laying flat   [] Claudication   [] Pain in feet when walking  [] Pain in feet at rest  [] Pain in feet when laying flat   [] History of DVT   [] Phlebitis   [] Swelling in legs   [] Varicose veins   [] Non-healing ulcers Pulmonary:   [] Uses home oxygen   [] Productive cough   [] Hemoptysis   [] Wheeze  [] COPD   [] Asthma Neurologic:  [] Dizziness  [] Blackouts   [] Seizures   [x] History of stroke   [] History of TIA  [] Aphasia   [] Temporary blindness   [] Dysphagia   [] Weakness or numbness in arms   []   Weakness or numbness in legs Musculoskeletal:  [x] Arthritis   [] Joint swelling   [] Joint pain   [] Low back pain Hematologic:  [] Easy bruising  [] Easy bleeding   [] Hypercoagulable state   [] Anemic  [] Hepatitis Gastrointestinal:  [] Blood in stool   [] Vomiting blood  [] Gastroesophageal reflux/heartburn   [] Difficulty swallowing. Genitourinary:  [] Chronic kidney disease   [] Difficult urination  [x] Frequent urination  [] Burning with urination   [] Blood in urine Skin:  [] Rashes   [] Ulcers   [] Wounds Psychological:  [] History of anxiety   []  History of major depression.  Physical Examination  Vitals:   10/02/17 0923  BP: (!) 153/63  Pulse: 73  Resp: 16  Temp: (!) 97 F (36.1 C)  TempSrc: Temporal  SpO2: 98%  Weight: 67.1 kg (148 lb)   Body mass index is 20.94 kg/m. Gen: WD/WN, NAD.  Appears younger than stated age. Head: [place]/AT, No temporalis wasting. Ear/Nose/Throat: Hearing grossly intact, nares w/o erythema or drainage, oropharynx w/o Erythema/Exudate,  Eyes: Conjunctiva clear, sclera non-icteric Neck: Trachea midline.  No JVD.  Pulmonary:  Good air movement, respirations not labored, no use of accessory muscles.  Cardiac: RRR, normal S1, S2. Vascular:  Vessel Right Left  Radial Palpable Palpable          Carotid Palpable, without bruit Palpable, with bruit                       Gastrointestinal: soft, non-tender/non-distended. No guarding/reflex.  Musculoskeletal: M/S 5/5 throughout.  Extremities without ischemic changes.  No deformity or atrophy.  Neurologic: Sensation grossly intact in extremities.  Symmetrical.  Speech is fluent. Motor exam as listed above. Psychiatric: Judgment intact, Mood & affect appropriate for pt's clinical situation. Dermatologic: No rashes or ulcers noted.  No cellulitis or open wounds.      CBC Lab Results  Component Value Date   WBC 7.4 09/24/2017   HGB 11.9 (L) 09/24/2017   HCT 36.0 (L) 09/24/2017   MCV 90.2 09/24/2017   PLT 280 09/24/2017    BMET    Component Value Date/Time   NA 129 (L) 09/24/2017 1132   K 4.0 09/24/2017 1132   CL 93 (L) 09/24/2017 1132   CO2 24 09/24/2017 1132   GLUCOSE 115 (H) 09/24/2017 1132   BUN 15 09/24/2017 1132   CREATININE 0.99 09/24/2017 1132   CREATININE 0.84 01/02/2013 0923   CALCIUM 9.0 09/24/2017 1132   GFRNONAA >60 09/24/2017 1132   GFRNONAA >60 01/02/2013 0923   GFRAA >60 09/24/2017 1132   GFRAA >60 01/02/2013 0923   Estimated Creatinine Clearance: 51.8 mL/min (by C-G formula based on SCr of 0.99 mg/dL).  COAG Lab Results  Component Value Date   INR 1.09 09/24/2017   INR 1.08 08/14/2017    Radiology No results found.    Assessment/Plan 1.  Left carotid artery stenosis with stroke approximately 6 weeks ago.  Patient has basically completely recovered at this  point without focal residual deficits.  Left carotid endarterectomy to be performed today for stroke risk reduction going forward.  Risks and benefits including stroke, heart attack, bleeding, and infection discussed and patient is agreeable. 2.  Hypertension.  Stable on outpatient medications and blood pressure control important in reducing the progression of atherosclerotic disease. On appropriate oral medications.     Festus BarrenJason Dew, MD  10/02/2017 9:56 AM

## 2017-10-02 NOTE — Anesthesia Post-op Follow-up Note (Signed)
Anesthesia QCDR form completed.        

## 2017-10-02 NOTE — Anesthesia Procedure Notes (Signed)
Arterial Line Insertion Start/End2/13/2019 10:28 AM, 10/02/2017 10:33 AM Performed by: Alver FisherPenwarden, [name], MD, Malva CoganBeane, Catherine, CRNA, attending  Patient location: OR. Preanesthetic checklist: patient identified, IV checked, site marked, risks and benefits discussed, surgical consent, monitors and equipment checked, pre-op evaluation, timeout performed and anesthesia consent Lidocaine 1% used for infiltration Left, radial was placed Catheter size: 20 Fr Hand hygiene performed  and maximum sterile barriers used   Attempts: 1 Procedure performed without using ultrasound guided technique. Following insertion, dressing applied. Post procedure assessment: normal and unchanged

## 2017-10-02 NOTE — Transfer of Care (Signed)
Immediate Anesthesia Transfer of Care Note  Patient: Martin HaverJasper L Omahoney  Procedure(s) Performed: ENDARTERECTOMY CAROTID (Left )  Patient Location: PACU  Anesthesia Type:General  Level of Consciousness: awake, alert  and oriented  Airway & Oxygen Therapy: Patient Spontanous Breathing and Patient connected to face mask oxygen  Post-op Assessment: Report given to RN and Post -op Vital signs reviewed and stable  Post vital signs: Reviewed and stable  Last Vitals:  Vitals:   10/02/17 0923 10/02/17 1252  BP: (!) 153/63 (!) 148/89  Pulse: 73 81  Resp: 16 19  Temp: (!) 36.1 C 36.6 C  SpO2: 98%     Last Pain:  Vitals:   10/02/17 0923  TempSrc: Temporal         Complications: No apparent anesthesia complications

## 2017-10-02 NOTE — Progress Notes (Signed)
Pt has remained alert and oriented with minimal c/o aching to his Left lateral neck surgical incision; otherwise no complaints. Skin glue to incision. Pt remains in NSR/SB with a R radial Arterial Line- appropriate wave form, positional. BP has slowly risen, sys 150s- pt sched hydrochlorothiazide given.  No nausea noted on clear liquid diet.  Pt has remained on RA. SpO2 96%. Lung sounds clear with little rhonchi.  Family has been updated at bedside.

## 2017-10-02 NOTE — Anesthesia Procedure Notes (Signed)
Procedure Name: Intubation Date/Time: 10/02/2017 10:32 AM Performed by: Hedda Slade, CRNA Pre-anesthesia Checklist: Patient identified, Patient being monitored, Timeout performed, Emergency Drugs available and Suction available Patient Re-evaluated:Patient Re-evaluated prior to induction Oxygen Delivery Method: Circle system utilized Preoxygenation: Pre-oxygenation with 100% oxygen Induction Type: IV induction Ventilation: Mask ventilation without difficulty Laryngoscope Size: Mac and 4 Grade View: Grade I Tube type: Oral Tube size: 7.5 mm Number of attempts: 1 Airway Equipment and Method: Stylet Placement Confirmation: ETT inserted through vocal cords under direct vision,  positive ETCO2 and breath sounds checked- equal and bilateral Secured at: 22 cm Tube secured with: Tape Dental Injury: Teeth and Oropharynx as per pre-operative assessment

## 2017-10-02 NOTE — Anesthesia Preprocedure Evaluation (Signed)
Anesthesia Evaluation  Patient identified by MRN, date of birth, ID band Patient awake    Reviewed: Allergy & Precautions, NPO status , Patient's Chart, lab work & pertinent test results  History of Anesthesia Complications (+) AWARENESS UNDER ANESTHESIA and history of anesthetic complications  Airway Mallampati: III  TM Distance: >3 FB Neck ROM: Full    Dental  (+) Edentulous Upper, Edentulous Lower   Pulmonary neg sleep apnea, neg COPD, former smoker,    breath sounds clear to auscultation- rhonchi (-) wheezing      Cardiovascular hypertension, Pt. on medications (-) CAD, (-) Past MI, (-) Cardiac Stents and (-) CABG  Rhythm:Regular Rate:Normal - Systolic murmurs and - Diastolic murmurs    Neuro/Psych CVA, No Residual Symptoms negative psych ROS   GI/Hepatic negative GI ROS, Neg liver ROS,   Endo/Other  negative endocrine ROSneg diabetes  Renal/GU negative Renal ROS     Musculoskeletal negative musculoskeletal ROS (+)   Abdominal (+) - obese,   Peds  Hematology negative hematology ROS (+)   Anesthesia Other Findings Past Medical History: No date: Family history of adverse reaction to anesthesia No date: HTN (hypertension) No date: Pneumonia No date: Prostate enlargement No date: Psoriasis   Reproductive/Obstetrics                             Anesthesia Physical Anesthesia Plan  ASA: III  Anesthesia Plan: General   Post-op Pain Management:    Induction: Intravenous  PONV Risk Score and Plan: 1 and Ondansetron and Dexamethasone  Airway Management Planned: Oral ETT  Additional Equipment: Arterial line  Intra-op Plan:   Post-operative Plan: Extubation in OR  Informed Consent: I have reviewed the patients History and Physical, chart, labs and discussed the procedure including the risks, benefits and alternatives for the proposed anesthesia with the patient or authorized  representative who has indicated his/her understanding and acceptance.   Dental advisory given  Plan Discussed with: CRNA and Anesthesiologist  Anesthesia Plan Comments:         Anesthesia Quick Evaluation

## 2017-10-03 ENCOUNTER — Other Ambulatory Visit: Payer: Self-pay

## 2017-10-03 ENCOUNTER — Encounter: Payer: Self-pay | Admitting: Vascular Surgery

## 2017-10-03 LAB — BASIC METABOLIC PANEL
Anion gap: 9 (ref 5–15)
BUN: 19 mg/dL (ref 6–20)
CHLORIDE: 100 mmol/L — AB (ref 101–111)
CO2: 23 mmol/L (ref 22–32)
Calcium: 8.5 mg/dL — ABNORMAL LOW (ref 8.9–10.3)
Creatinine, Ser: 0.85 mg/dL (ref 0.61–1.24)
Glucose, Bld: 129 mg/dL — ABNORMAL HIGH (ref 65–99)
POTASSIUM: 4.3 mmol/L (ref 3.5–5.1)
SODIUM: 132 mmol/L — AB (ref 135–145)

## 2017-10-03 LAB — CBC
HCT: 28.3 % — ABNORMAL LOW (ref 40.0–52.0)
HEMOGLOBIN: 9.6 g/dL — AB (ref 13.0–18.0)
MCH: 30.3 pg (ref 26.0–34.0)
MCHC: 33.7 g/dL (ref 32.0–36.0)
MCV: 89.7 fL (ref 80.0–100.0)
PLATELETS: 220 10*3/uL (ref 150–440)
RBC: 3.16 MIL/uL — AB (ref 4.40–5.90)
RDW: 15 % — ABNORMAL HIGH (ref 11.5–14.5)
WBC: 9.7 10*3/uL (ref 3.8–10.6)

## 2017-10-03 LAB — SURGICAL PATHOLOGY

## 2017-10-03 NOTE — Discharge Instructions (Signed)
You may shower as of Friday. Please do not drive until our first follow up in our office.

## 2017-10-03 NOTE — Anesthesia Postprocedure Evaluation (Signed)
Anesthesia Post Note  Patient: Martin Bean  Procedure(s) Performed: ENDARTERECTOMY CAROTID (Left )  Patient location during evaluation: ICU Anesthesia Type: General Level of consciousness: awake, awake and alert and oriented Pain management: pain level controlled Vital Signs Assessment: post-procedure vital signs reviewed and stable Respiratory status: spontaneous breathing Cardiovascular status: blood pressure returned to baseline and stable Postop Assessment: no headache and no backache Anesthetic complications: no     Last Vitals:  Vitals:   10/03/17 0700 10/03/17 0800  BP: 114/61 (!) 134/53  Pulse: (!) 57 (!) 48  Resp: 14   Temp:  36.8 C  SpO2: 95% 98%    Last Pain:  Vitals:   10/03/17 0800  TempSrc: Oral  PainSc:                  Vernie MurdersPope,  Codie Hainer G

## 2017-10-03 NOTE — Anesthesia Post-op Follow-up Note (Signed)
Anesthesia QCDR form completed.        

## 2017-10-03 NOTE — Progress Notes (Signed)
Discharge instructions reviewed with patient and daughters. Questions from patient and family answered. Pt and family verbalized understanding. IVs removed. VS WNL. Pt taken to care via wheelchair by volunteer.

## 2017-10-03 NOTE — Progress Notes (Signed)
Paged Dr. Wyn Quakerew regarding patients HR in the low 40's. BP remains stable and patient continues to have no symptoms. Verbal order given to start a dopamine if HR drops to 35 or lower.

## 2017-10-03 NOTE — Discharge Summary (Signed)
[facility]    Discharge Summary   Patient ID:  Martin Bean MRN: [id number] DOB/AGE: [birth year] 84 y.o.  Admit date: 10/02/2017 Discharge date: 10/03/2017 Date of Surgery: 10/02/2017 Surgeon: Surgeon(s): Wyn Quaker Marlow Baars, MD  Admission Diagnosis: CAROTID ARTERY STENOSIS  Discharge Diagnoses:  CAROTID ARTERY STENOSIS  Secondary Diagnoses: Past Medical History:  Diagnosis Date  . Family history of adverse reaction to anesthesia   . HTN (hypertension)   . Pneumonia   . Prostate enlargement   . Psoriasis    Procedure(s): ENDARTERECTOMY CAROTID  Discharged Condition: good  HPI:  The patient is an 84 year old male who was initially seen as a consult at [facility] and found to have previous stroke and significant left carotid stenosis.  On October 02, 2017, the patient underwent a left carotid endarterectomy with core matrix arterial patch reconstruction.  The patient tolerated the procedure without issue was transferred to the ICU without complication.  The patient's bladder surgery was unremarkable.  Patient was discharged home the next afternoon.  During the patient's brief inpatient stay, his diet was advanced, his Foley was removed, he was ambulating independently and his pain was controlled with p.o. pain medication.  Hospital Course:  [name] is a 84 y.o. male is S/P Left  Procedure(s): ENDARTERECTOMY CAROTID  Extubated: POD # 0  Physical exam:  Alert and oriented x3, no acute distress Cardiovascular: Regular rate and rhythm Neck: Incision clean dry and intact.  No signs of swelling.  No signs of drainage.  Trachea midline. Pulmonary: Clear to auscultation bilaterally Abdomen: Soft, nontender, nondistended, positive bowel sounds Vascular: Bilateral lower extremity warm and soft Neuro: Tongue midline.  Upper extremity strength 5 out of 5.  Lower extremity strength 5 out of 5.    Post-op wounds clean, dry,  intact or healing well  Pt. Ambulating, voiding and taking PO diet without difficulty.  Pt pain controlled with PO pain meds.  Labs as below  Complications: None  Consults: None  Significant Diagnostic Studies: CBC Lab Results  Component Value Date   WBC 9.7 10/03/2017   HGB 9.6 (L) 10/03/2017   HCT 28.3 (L) 10/03/2017   MCV 89.7 10/03/2017   PLT 220 10/03/2017    BMET    Component Value Date/Time   NA 132 (L) 10/03/2017 0610   K 4.3 10/03/2017 0610   CL 100 (L) 10/03/2017 0610   CO2 23 10/03/2017 0610   GLUCOSE 129 (H) 10/03/2017 0610   BUN 19 10/03/2017 0610   CREATININE 0.85 10/03/2017 0610   CREATININE 0.84 01/02/2013 0923   CALCIUM 8.5 (L) 10/03/2017 0610   GFRNONAA >60 10/03/2017 0610   GFRNONAA >60 01/02/2013 0923   GFRAA >60 10/03/2017 0610   GFRAA >60 01/02/2013 0923   COAG Lab Results  Component Value Date   INR 1.09 09/24/2017   INR 1.08 08/14/2017     Disposition:  Discharge to :Home  Allergies as of 10/03/2017   No Known Allergies     Medication List    TAKE these medications   aspirin 81 MG EC tablet Take 1 tablet (81 mg total) by mouth daily. What changed:  when to take this   atorvastatin 40 MG tablet Commonly known as:  LIPITOR Take 1 tablet (40 mg total) by mouth daily at 6 PM.   cetaphil cream Apply 1 application topically 3 (three) times daily as needed (for psoriasis.).   clopidogrel 75 MG tablet Commonly known as:  PLAVIX Take 1  tablet (75 mg total) by mouth daily. What changed:  when to take this   hydrochlorothiazide 12.5 MG capsule Commonly known as:  MICROZIDE Take 12.5 mg by mouth daily.   OTEZLA 30 MG Tabs Generic drug:  Apremilast Take 30 mg by mouth daily with supper.   sertraline 50 MG tablet Commonly known as:  ZOLOFT Take 50 mg by mouth at bedtime.   tamsulosin 0.4 MG Caps capsule Commonly known as:  FLOMAX Take 0.4 mg by mouth daily at 2 PM.   triamcinolone cream 0.1 % Commonly known as:   KENALOG Apply 1 application topically 2 (two) times daily as needed (for psoriasis.).      Verbal and written Discharge instructions given to the patient. Wound care per Discharge AVS Follow-up Information    Kaylie Ritter, Ranae PlumberKimberly A, PA-C In 1 week.   Specialty:  Physician Assistant Why:  First Postop Check. Incision Check.- Wed Feb 20th at 300 pm with Danne BaxterKim  Contact information: [address] [place] [address] [phone]          Signed: Tonette LedererKIMBERLY A Henli Hey, PA-C  10/03/2017, 2:49 PM

## 2017-10-07 ENCOUNTER — Encounter: Payer: Self-pay | Admitting: Vascular Surgery

## 2017-10-09 ENCOUNTER — Ambulatory Visit (INDEPENDENT_AMBULATORY_CARE_PROVIDER_SITE_OTHER): Payer: Medicare PPO | Admitting: Vascular Surgery

## 2017-10-09 ENCOUNTER — Encounter (INDEPENDENT_AMBULATORY_CARE_PROVIDER_SITE_OTHER): Payer: Self-pay | Admitting: Vascular Surgery

## 2017-10-09 VITALS — BP 135/71 | HR 73 | Resp 16 | Wt 145.4 lb

## 2017-10-09 DIAGNOSIS — I6522 Occlusion and stenosis of left carotid artery: Secondary | ICD-10-CM

## 2017-10-09 DIAGNOSIS — I10 Essential (primary) hypertension: Secondary | ICD-10-CM | POA: Insufficient documentation

## 2017-10-09 DIAGNOSIS — E785 Hyperlipidemia, unspecified: Secondary | ICD-10-CM | POA: Insufficient documentation

## 2017-10-09 HISTORY — DX: Hyperlipidemia, unspecified: E78.5

## 2017-10-09 NOTE — Progress Notes (Signed)
Subjective:    Patient ID: Martin Bean, male    DOB: [birth year], 84 y.o.   MRN: [id number] Chief Complaint  Patient presents with  . Follow-up    1week Left CEA   The patient presents for his first postoperative follow-up.  The patient is status post a left carotid endarterectomy on February 13th 2019.  The patient was seen with his daughter.  The patient has experienced an uneventful postoperative course.  The patient denies any issues with his incision.  The patient denies any significant pain or swelling to the neck. The patient denies any amaurosis fugax or focal neurological symptoms.  The patient denies any fever, nausea or vomiting.   Review of Systems  Constitutional: Negative.   HENT: Negative.   Eyes: Negative.   Respiratory: Negative.   Cardiovascular: Negative.   Gastrointestinal: Negative.   Endocrine: Negative.   Genitourinary: Negative.   Musculoskeletal: Negative.   Skin: Negative.   Allergic/Immunologic: Negative.   Neurological: Negative.   Hematological: Negative.   Psychiatric/Behavioral: Negative.       Objective:   Physical Exam  Constitutional: He is oriented to person, place, and time. He appears well-developed and well-nourished. No distress.  HENT:  Head: Normocephalic and atraumatic.  Eyes: Conjunctivae are normal.  Neck: Normal range of motion.  No carotid bruits auscultated on exam. No drainage or swelling noted. Incision is healing well.  Cardiovascular: Normal rate, regular rhythm, normal heart sounds and intact distal pulses.  Pulses:      Radial pulses are 2+ on the right side, and 2+ on the left side.  Pulmonary/Chest: Effort normal and breath sounds normal.  Musculoskeletal: Normal range of motion. He exhibits no edema.  Neurological: He is alert and oriented to person, place, and time.  Skin: Skin is warm and dry. He is not diaphoretic.  Psychiatric: He has a normal mood and affect. His behavior is normal. Judgment and thought  content normal.  Vitals reviewed.  BP 135/71 (BP Location: Right Arm)   Pulse 73   Resp 16   Wt 145 lb 6.4 oz (66 kg)   BMI 20.28 kg/m   Past Medical History:  Diagnosis Date  . Family history of adverse reaction to anesthesia   . HTN (hypertension)   . Pneumonia   . Prostate enlargement   . Psoriasis    Social History   Socioeconomic History  . Marital status: Widowed    Spouse name: Not on file  . Number of children: Not on file  . Years of education: Not on file  . Highest education level: Not on file  Social Needs  . Financial resource strain: Not on file  . Food insecurity - worry: Not on file  . Food insecurity - inability: Not on file  . Transportation needs - medical: Not on file  . Transportation needs - non-medical: Not on file  Occupational History  . Not on file  Tobacco Use  . Smoking status: Former Smoker    Types: Cigarettes    Last attempt to quit: 09/24/2013    Years since quitting: 4.0  . Smokeless tobacco: Never Used  Substance and Sexual Activity  . Alcohol use: No    Frequency: Never  . Drug use: No  . Sexual activity: Not on file  Other Topics Concern  . Not on file  Social History Narrative  . Not on file   Past Surgical History:  Procedure Laterality Date  . ENDARTERECTOMY Left 10/02/2017   Procedure: ENDARTERECTOMY  CAROTID;  Surgeon: Annice Needyew, Jason S, MD;  Location: [facility] ORS;  Service: Vascular;  Laterality: Left;  . HERNIA REPAIR     Family History  Problem Relation Age of Onset  . Stroke Mother    No Known Allergies     Assessment & Plan:  The patient presents for his first postoperative follow-up.  The patient is status post a left carotid endarterectomy on February 13th 2019.  The patient was seen with his daughter.  The patient has experienced an uneventful postoperative course.  The patient denies any issues with his incision.  The patient denies any significant pain or swelling to the neck. The patient denies any amaurosis fugax  or focal neurological symptoms.  The patient denies any fever, nausea or vomiting.  1. Essential hypertension - Stable Encouraged good control as its slows the progression of atherosclerotic disease  2. Hyperlipidemia, unspecified hyperlipidemia type - Stable Encouraged good control as its slows the progression of atherosclerotic disease  3. Stenosis of left carotid artery s/p left carotid endarterectomy - New Patient presents for his first postoperative follow-up Patient's postoperative course has been unremarkable Patient's physical exam is unremarkable Patient is doing well postoperatively Patient is to follow-up in 1 month and undergo a carotid duplex  - VAS US CAROTID; Future  Current Outpatient Medications on File Prior to Visit  Medication Sig Dispense Refill  . Apremilast (OTEZLA) 30 MG TABS Take 30 mg by mouth daily with supper.     Marland Kitchen. aspirin EC 81 MG EC tablet Take 1 tablet (81 mg total) by mouth daily. (Patient taking differently: Take 81 mg by mouth at bedtime. )    . atorvastatin (LIPITOR) 40 MG tablet Take 1 tablet (40 mg total) by mouth daily at 6 PM. 30 tablet 0  . cetaphil (CETAPHIL) cream Apply 1 application topically 3 (three) times daily as needed (for psoriasis.).    Marland Kitchen. clopidogrel (PLAVIX) 75 MG tablet Take 1 tablet (75 mg total) by mouth daily. (Patient taking differently: Take 75 mg by mouth at bedtime. ) 30 tablet 0  . hydrochlorothiazide (MICROZIDE) 12.5 MG capsule Take 12.5 mg by mouth daily.    . sertraline (ZOLOFT) 50 MG tablet Take 50 mg by mouth at bedtime.     . tamsulosin (FLOMAX) 0.4 MG CAPS capsule Take 0.4 mg by mouth daily at 2 PM.     . triamcinolone cream (KENALOG) 0.1 % Apply 1 application topically 2 (two) times daily as needed (for psoriasis.).      No current facility-administered medications on file prior to visit.    There are no Patient Instructions on file for this visit. No Follow-up on file.  Brayleigh Rybacki A Zachory Mangual, PA-C

## 2017-10-10 ENCOUNTER — Telehealth: Payer: Self-pay

## 2017-10-10 NOTE — Telephone Encounter (Signed)
Flagged on EMMI report for not knowing who to call about changes in his condition.   Called and spoke with patient.  He informed me that he followed up with his surgeon on 2/20 and knows who to follow up with now for any concerns.  No further concerns at this time.

## 2017-11-01 ENCOUNTER — Encounter: Payer: Self-pay | Admitting: Emergency Medicine

## 2017-11-01 ENCOUNTER — Emergency Department: Payer: Medicare PPO

## 2017-11-01 ENCOUNTER — Other Ambulatory Visit: Payer: Self-pay

## 2017-11-01 ENCOUNTER — Inpatient Hospital Stay: Payer: Medicare PPO

## 2017-11-01 ENCOUNTER — Inpatient Hospital Stay
Admission: EM | Admit: 2017-11-01 | Discharge: 2017-11-06 | DRG: 199 | Disposition: A | Discharge status: Home Health | Payer: Medicare PPO | Attending: Internal Medicine | Admitting: Internal Medicine

## 2017-11-01 DIAGNOSIS — J189 Pneumonia, unspecified organism: Secondary | ICD-10-CM | POA: Diagnosis present

## 2017-11-01 DIAGNOSIS — J948 Other specified pleural conditions: Secondary | ICD-10-CM

## 2017-11-01 DIAGNOSIS — N400 Enlarged prostate without lower urinary tract symptoms: Secondary | ICD-10-CM | POA: Diagnosis present

## 2017-11-01 DIAGNOSIS — D62 Acute posthemorrhagic anemia: Secondary | ICD-10-CM | POA: Diagnosis present

## 2017-11-01 DIAGNOSIS — J9601 Acute respiratory failure with hypoxia: Secondary | ICD-10-CM | POA: Diagnosis present

## 2017-11-01 DIAGNOSIS — S2241XA Multiple fractures of ribs, right side, initial encounter for closed fracture: Secondary | ICD-10-CM | POA: Diagnosis present

## 2017-11-01 DIAGNOSIS — S272XXA Traumatic hemopneumothorax, initial encounter: Secondary | ICD-10-CM | POA: Diagnosis present

## 2017-11-01 DIAGNOSIS — J9811 Atelectasis: Secondary | ICD-10-CM | POA: Diagnosis present

## 2017-11-01 DIAGNOSIS — Z7902 Long term (current) use of antithrombotics/antiplatelets: Secondary | ICD-10-CM | POA: Diagnosis not present

## 2017-11-01 DIAGNOSIS — E785 Hyperlipidemia, unspecified: Secondary | ICD-10-CM | POA: Diagnosis present

## 2017-11-01 DIAGNOSIS — E440 Moderate protein-calorie malnutrition: Secondary | ICD-10-CM

## 2017-11-01 DIAGNOSIS — Z681 Body mass index (BMI) 19 or less, adult: Secondary | ICD-10-CM

## 2017-11-01 DIAGNOSIS — L409 Psoriasis, unspecified: Secondary | ICD-10-CM | POA: Diagnosis present

## 2017-11-01 DIAGNOSIS — W010XXA Fall on same level from slipping, tripping and stumbling without subsequent striking against object, initial encounter: Secondary | ICD-10-CM | POA: Diagnosis present

## 2017-11-01 DIAGNOSIS — S27329A Contusion of lung, unspecified, initial encounter: Secondary | ICD-10-CM | POA: Diagnosis present

## 2017-11-01 DIAGNOSIS — E871 Hypo-osmolality and hyponatremia: Secondary | ICD-10-CM | POA: Diagnosis present

## 2017-11-01 DIAGNOSIS — Z9889 Other specified postprocedural states: Secondary | ICD-10-CM

## 2017-11-01 DIAGNOSIS — N179 Acute kidney failure, unspecified: Secondary | ICD-10-CM | POA: Diagnosis present

## 2017-11-01 DIAGNOSIS — Z79899 Other long term (current) drug therapy: Secondary | ICD-10-CM | POA: Diagnosis not present

## 2017-11-01 DIAGNOSIS — I10 Essential (primary) hypertension: Secondary | ICD-10-CM | POA: Diagnosis present

## 2017-11-01 DIAGNOSIS — E860 Dehydration: Secondary | ICD-10-CM | POA: Diagnosis present

## 2017-11-01 DIAGNOSIS — Z7982 Long term (current) use of aspirin: Secondary | ICD-10-CM | POA: Diagnosis not present

## 2017-11-01 DIAGNOSIS — J942 Hemothorax: Secondary | ICD-10-CM

## 2017-11-01 DIAGNOSIS — J939 Pneumothorax, unspecified: Secondary | ICD-10-CM

## 2017-11-01 DIAGNOSIS — J90 Pleural effusion, not elsewhere classified: Secondary | ICD-10-CM

## 2017-11-01 DIAGNOSIS — W19XXXA Unspecified fall, initial encounter: Secondary | ICD-10-CM

## 2017-11-01 DIAGNOSIS — Z8673 Personal history of transient ischemic attack (TIA), and cerebral infarction without residual deficits: Secondary | ICD-10-CM | POA: Diagnosis not present

## 2017-11-01 DIAGNOSIS — S270XXA Traumatic pneumothorax, initial encounter: Secondary | ICD-10-CM

## 2017-11-01 DIAGNOSIS — R0602 Shortness of breath: Secondary | ICD-10-CM | POA: Diagnosis present

## 2017-11-01 DIAGNOSIS — R079 Chest pain, unspecified: Secondary | ICD-10-CM

## 2017-11-01 HISTORY — DX: Other specified pleural conditions: J94.8

## 2017-11-01 HISTORY — DX: Unspecified diastolic (congestive) heart failure: I50.30

## 2017-11-01 LAB — CBC WITH DIFFERENTIAL/PLATELET
BASOS ABS: 0 10*3/uL (ref 0–0.1)
Basophils Relative: 0 %
Eosinophils Absolute: 0 10*3/uL (ref 0–0.7)
Eosinophils Relative: 0 %
HEMATOCRIT: 27.4 % — AB (ref 40.0–52.0)
HEMOGLOBIN: 9 g/dL — AB (ref 13.0–18.0)
LYMPHS PCT: 4 %
Lymphs Abs: 0.6 10*3/uL — ABNORMAL LOW (ref 1.0–3.6)
MCH: 29.7 pg (ref 26.0–34.0)
MCHC: 32.9 g/dL (ref 32.0–36.0)
MCV: 90.1 fL (ref 80.0–100.0)
Monocytes Absolute: 0.7 10*3/uL (ref 0.2–1.0)
Monocytes Relative: 5 %
NEUTROS ABS: 13.5 10*3/uL — AB (ref 1.4–6.5)
NEUTROS PCT: 91 %
Platelets: 234 10*3/uL (ref 150–440)
RBC: 3.05 MIL/uL — AB (ref 4.40–5.90)
RDW: 15.2 % — ABNORMAL HIGH (ref 11.5–14.5)
WBC: 14.8 10*3/uL — AB (ref 3.8–10.6)

## 2017-11-01 LAB — BASIC METABOLIC PANEL
ANION GAP: 12 (ref 5–15)
BUN: 28 mg/dL — ABNORMAL HIGH (ref 6–20)
CO2: 24 mmol/L (ref 22–32)
Calcium: 8.7 mg/dL — ABNORMAL LOW (ref 8.9–10.3)
Chloride: 95 mmol/L — ABNORMAL LOW (ref 101–111)
Creatinine, Ser: 1.44 mg/dL — ABNORMAL HIGH (ref 0.61–1.24)
GFR calc Af Amer: 50 mL/min — ABNORMAL LOW (ref >60)
GFR, EST NON AFRICAN AMERICAN: 43 mL/min — AB (ref >60)
GLUCOSE: 176 mg/dL — AB (ref 65–99)
POTASSIUM: 4.1 mmol/L (ref 3.5–5.1)
Sodium: 131 mmol/L — ABNORMAL LOW (ref 135–145)

## 2017-11-01 LAB — INFLUENZA PANEL BY PCR (TYPE A & B)
INFLAPCR: NEGATIVE
Influenza B By PCR: NEGATIVE

## 2017-11-01 LAB — PROTIME-INR
INR: 1.2
Prothrombin Time: 15.1 seconds (ref 11.4–15.2)

## 2017-11-01 LAB — TROPONIN I: Troponin I: <0.03 ng/mL (ref <0.03)

## 2017-11-01 LAB — BRAIN NATRIURETIC PEPTIDE: B NATRIURETIC PEPTIDE 5: 64 pg/mL (ref 0.0–100.0)

## 2017-11-01 MED ORDER — METHYLPREDNISOLONE SODIUM SUCC 40 MG IJ SOLR
40.0000 mg | Freq: Four times a day (QID) | INTRAMUSCULAR | Status: DC
  Administered 2017-11-01 – 2017-11-02 (×3): 40 mg via INTRAVENOUS
  Filled 2017-11-01 (×3): qty 1

## 2017-11-01 MED ORDER — ADULT MULTIVITAMIN W/MINERALS CH
1.0000 | ORAL_TABLET | Freq: Every day | ORAL | Status: DC
  Administered 2017-11-01 – 2017-11-06 (×6): 1 via ORAL
  Filled 2017-11-01 (×6): qty 1

## 2017-11-01 MED ORDER — SENNOSIDES-DOCUSATE SODIUM 8.6-50 MG PO TABS: 1.0000 | ORAL_TABLET | Freq: Every evening | ORAL | Status: DC | PRN

## 2017-11-01 MED ORDER — SODIUM CHLORIDE 0.9 % IV SOLN: 250.0000 mL | INTRAVENOUS | Status: DC | PRN

## 2017-11-01 MED ORDER — HYDROCERIN EX CREA
1.0000 "application " | TOPICAL_CREAM | Freq: Three times a day (TID) | CUTANEOUS | Status: DC | PRN
  Filled 2017-11-01: qty 113

## 2017-11-01 MED ORDER — ENOXAPARIN SODIUM 40 MG/0.4ML ~~LOC~~ SOLN
40.0000 mg | SUBCUTANEOUS | Status: DC
  Administered 2017-11-01 – 2017-11-02 (×2): 40 mg via SUBCUTANEOUS
  Filled 2017-11-01 (×2): qty 0.4

## 2017-11-01 MED ORDER — ASPIRIN 81 MG PO TBEC: 81.0000 mg | DELAYED_RELEASE_TABLET | Freq: Every day | ORAL | Status: DC

## 2017-11-01 MED ORDER — ASPIRIN EC 81 MG PO TBEC
81.0000 mg | DELAYED_RELEASE_TABLET | Freq: Every day | ORAL | Status: DC
  Administered 2017-11-01 – 2017-11-06 (×6): 81 mg via ORAL
  Filled 2017-11-01 (×6): qty 1

## 2017-11-01 MED ORDER — FENTANYL CITRATE (PF) 100 MCG/2ML IJ SOLN
50.0000 ug | Freq: Once | INTRAMUSCULAR | Status: AC
  Administered 2017-11-01: 50 ug via INTRAVENOUS
  Filled 2017-11-01: qty 2

## 2017-11-01 MED ORDER — TAMSULOSIN HCL 0.4 MG PO CAPS
0.4000 mg | ORAL_CAPSULE | Freq: Every day | ORAL | Status: DC
  Administered 2017-11-01 – 2017-11-05 (×5): 0.4 mg via ORAL
  Filled 2017-11-01 (×5): qty 1

## 2017-11-01 MED ORDER — ENSURE ENLIVE PO LIQD
237.0000 mL | Freq: Two times a day (BID) | ORAL | Status: DC
  Administered 2017-11-01 – 2017-11-05 (×6): 237 mL via ORAL

## 2017-11-01 MED ORDER — IPRATROPIUM-ALBUTEROL 0.5-2.5 (3) MG/3ML IN SOLN: 3.0000 mL | Freq: Four times a day (QID) | RESPIRATORY_TRACT | Status: DC | PRN

## 2017-11-01 MED ORDER — SODIUM CHLORIDE 0.9% FLUSH
3.0000 mL | Freq: Two times a day (BID) | INTRAVENOUS | Status: DC
  Administered 2017-11-01 – 2017-11-06 (×10): 3 mL via INTRAVENOUS

## 2017-11-01 MED ORDER — SERTRALINE HCL 50 MG PO TABS
50.0000 mg | ORAL_TABLET | Freq: Every day | ORAL | Status: DC
  Administered 2017-11-01 – 2017-11-05 (×5): 50 mg via ORAL
  Filled 2017-11-01 (×5): qty 1

## 2017-11-01 MED ORDER — ONDANSETRON HCL 4 MG PO TABS: 4.0000 mg | ORAL_TABLET | Freq: Four times a day (QID) | ORAL | Status: DC | PRN

## 2017-11-01 MED ORDER — ONDANSETRON HCL 4 MG/2ML IJ SOLN
4.0000 mg | Freq: Four times a day (QID) | INTRAMUSCULAR | Status: DC | PRN
  Administered 2017-11-05: 4 mg via INTRAVENOUS
  Filled 2017-11-01: qty 2

## 2017-11-01 MED ORDER — SODIUM CHLORIDE 0.9 % IV SOLN
1.0000 g | Freq: Once | INTRAVENOUS | Status: AC
  Administered 2017-11-01: 1 g via INTRAVENOUS
  Filled 2017-11-01: qty 10

## 2017-11-01 MED ORDER — AZITHROMYCIN 500 MG IV SOLR
500.0000 mg | Freq: Once | INTRAVENOUS | Status: AC
  Administered 2017-11-01: 500 mg via INTRAVENOUS
  Filled 2017-11-01 (×2): qty 500

## 2017-11-01 MED ORDER — APREMILAST 30 MG PO TABS
30.0000 mg | ORAL_TABLET | Freq: Every day | ORAL | Status: DC
  Filled 2017-11-01 (×3): qty 1

## 2017-11-01 MED ORDER — TRIAMCINOLONE ACETONIDE 0.1 % EX CREA
1.0000 "application " | TOPICAL_CREAM | Freq: Two times a day (BID) | CUTANEOUS | Status: DC | PRN
  Filled 2017-11-01: qty 15

## 2017-11-01 MED ORDER — ATORVASTATIN CALCIUM 20 MG PO TABS
40.0000 mg | ORAL_TABLET | Freq: Every day | ORAL | Status: DC
  Administered 2017-11-01 – 2017-11-05 (×5): 40 mg via ORAL
  Filled 2017-11-01 (×5): qty 2

## 2017-11-01 MED ORDER — SODIUM CHLORIDE 0.9% FLUSH
3.0000 mL | INTRAVENOUS | Status: DC | PRN
Start: 2017-11-01 — End: 2017-11-06

## 2017-11-01 MED ORDER — ACETAMINOPHEN 650 MG RE SUPP
650.0000 mg | Freq: Four times a day (QID) | RECTAL | Status: DC | PRN
Start: 2017-11-01 — End: 2017-11-06

## 2017-11-01 MED ORDER — HYDROCODONE-ACETAMINOPHEN 5-325 MG PO TABS
1.0000 | ORAL_TABLET | ORAL | Status: DC | PRN
  Administered 2017-11-01 – 2017-11-03 (×3): 1 via ORAL
  Administered 2017-11-03: 2 via ORAL
  Administered 2017-11-04: 1 via ORAL
  Administered 2017-11-05 – 2017-11-06 (×3): 2 via ORAL
  Filled 2017-11-01 (×2): qty 2
  Filled 2017-11-01 (×4): qty 1
  Filled 2017-11-01 (×2): qty 2

## 2017-11-01 MED ORDER — ACETAMINOPHEN 325 MG PO TABS: 650.0000 mg | ORAL_TABLET | Freq: Four times a day (QID) | ORAL | Status: DC | PRN

## 2017-11-01 MED ORDER — HYDROCHLOROTHIAZIDE 25 MG PO TABS
12.5000 mg | ORAL_TABLET | Freq: Every day | ORAL | Status: DC
  Administered 2017-11-01 – 2017-11-02 (×2): 12.5 mg via ORAL
  Filled 2017-11-01 (×2): qty 1

## 2017-11-01 NOTE — ED Triage Notes (Signed)
Pt to ED via [facility] from home for fall yesterday. Pt is c/o right rib pain from fall. Pt also having shortness of breath and dizziness when standing. Pt has audible rales. Pt states that he has had a cough for "a while". Pt is currently in NAD. Per EMS pt was given 1 duoneb in transport, pt reported feeling better after treatment.

## 2017-11-01 NOTE — ED Notes (Signed)
Lab sent at this time. Report called to Brett Canales RN on 2C. Pt is in transit now to unit.

## 2017-11-01 NOTE — ED Notes (Signed)
Pt O2 sats dropped to 88% on room air. Pt was placed on 2 liters O2 via [place] with improvement in sats to 97%. EDP aware.

## 2017-11-01 NOTE — Plan of Care (Signed)
  Health Behavior/Discharge Planning: Ability to manage health-related needs will improve 11/01/2017 1524 - Progressing by Raynald BlendImhoff, Anallely Rosell M, RN Note Patient currently getting the second of three ordered CXR's at the moment to evaluate pneumothorax. Will continue to monitor patient's respiratory status. Jari FavreSteven M [facility]

## 2017-11-01 NOTE — ED Notes (Signed)
Pt offered urinal, but pt refused

## 2017-11-01 NOTE — Progress Notes (Addendum)
Initial Nutrition Assessment  DOCUMENTATION CODES:   Non-severe (moderate) malnutrition in context of chronic illness  INTERVENTION:   Ensure Enlive po BID, each supplement provides 350 kcal and 20 grams of protein  MVI daily  Magic cup TID with meals, each supplement provides 290 kcal and 9 grams of protein  Liberalize diet   NUTRITION DIAGNOSIS:   Moderate Malnutrition related to other (see comment)(chronic poor appetite, advanced age) as evidenced by moderate fat depletion, moderate muscle depletion.  GOAL:   Patient will meet greater than or equal to 90% of their needs  MONITOR:   PO intake, Supplement acceptance, Labs, Weight trends, I & O's, Skin  REASON FOR ASSESSMENT:   Malnutrition Screening Tool    ASSESSMENT:   84 y.o. male with a known history of hypertension, prostate hypertrophy, psoriasis presented to the emergency room for fall. Pt found to have rib fractures and hydropneumothorax    Met with pt and family in room today. Pt reports good appetite and oral intake pta but pt's family in room was shaking their heads that he does not eat well. Pt reports that he does drink some Ensure but per family very little. Pt reports his UBW used to be around 175lbs but he reports weight loss r/t his psoriasis medications. Pt does not have any teeth and prefers a dysphagia 3 diet. Per chart, pt has lost 7lbs(5%) over the past month; this is significant given the time frame. RD will order supplements and MVI to help pt meet his estimated needs. Pt eating 95% of meals today.    Medications reviewed and include: apremilast, aspirin, lovenox, azithromycin  Labs reviewed: Na 131(L), Cl 95(L), BUN 28(H), creat 1.44(H), Ca 8.7(L) Wbc- 14.8(H), Hgb 9.0(L), Hct 27.4(L)  NUTRITION - FOCUSED PHYSICAL EXAM:    Most Recent Value  Orbital Region  Mild depletion  Upper Arm Region  Moderate depletion  Thoracic and Lumbar Region  Moderate depletion  Buccal Region  Moderate depletion   Temple Region  Moderate depletion  Clavicle Bone Region  Severe depletion  Clavicle and Acromion Bone Region  Severe depletion  Scapular Bone Region  Moderate depletion  Dorsal Hand  Moderate depletion  Patellar Region  Moderate depletion  Anterior Thigh Region  Moderate depletion  Posterior Calf Region  Moderate depletion  Edema (RD Assessment)  None  Hair  Reviewed  Eyes  Reviewed  Mouth  Reviewed  Skin  Reviewed  Nails  Reviewed       Diet Order:  DIET DYS 3 Room service appropriate? Yes; Fluid consistency: Thin  EDUCATION NEEDS:   Education needs have been addressed  Skin:  Reviewed RN Assessment  Last BM:  3/15- type 4  Height:   Ht Readings from Last 1 Encounters:  11/01/17 5' 11"  (1.803 m)    Weight:   Wt Readings from Last 1 Encounters:  11/01/17 138 lb 8 oz (62.8 kg)    Ideal Body Weight:  78.2 kg  BMI:  Body mass index is 19.32 kg/m.  Estimated Nutritional Needs:   Kcal:  1700-2000kcal/day   Protein:  94-107g/day   Fluid:  >1.6L/day   Martin Distance MS, RD, LDN Pager #[phone] After Hours Pager: [phone]

## 2017-11-01 NOTE — Progress Notes (Signed)
Gave patient an incentive spirometer and instructed on usage and ordered frequency. Will continue to encourage use. Jari FavreSteven M [facility]

## 2017-11-01 NOTE — H&P (Signed)
[facility] [place] at [facility]   PATIENT NAME: Martin Bean Dorough    MR#:  [id number]  DATE OF BIRTH:  07/01/1930  DATE OF ADMISSION:  11/01/2017  PRIMARY CARE PHYSICIAN: [name], [name], MD   REQUESTING/REFERRING PHYSICIAN:   CHIEF COMPLAINT:   Chief Complaint  Patient presents with  . Shortness of Breath  . Fall    HISTORY OF PRESENT ILLNESS: Martin Bean Stamos  is a 84 y.o. male with a known history of hypertension, prostate hypertrophy, psoriasis presented to the emergency room for fall.  Patient yesterday evening at home lost balance and fell down on the right side.  No history of any head injury.  Has pain in the right-sided rib cage.  No loss of consciousness, and any witnessed seizures patient also had some difficulty breathing was put on oxygen via nasal cannula after arrival in the emergency room.Marland Kitchen. Was worked up with chest x-ray which revealed fractures of the ribs on the right side along with small hydropneumothorax less than 5%.  Oxygen saturation is stable greater than 93%.  No fever and chills.  He was worked up with CT head which showed no acute abnormality.  PAST MEDICAL HISTORY:   Past Medical History:  Diagnosis Date  . Family history of adverse reaction to anesthesia   . HTN (hypertension)   . Pneumonia   . Prostate enlargement   . Psoriasis     PAST SURGICAL HISTORY:  Past Surgical History:  Procedure Laterality Date  . ENDARTERECTOMY Left 10/02/2017   Procedure: ENDARTERECTOMY CAROTID;  Surgeon: Annice Needyew, Jason S, MD;  Location: [facility] ORS;  Service: Vascular;  Laterality: Left;  . HERNIA REPAIR      SOCIAL HISTORY:  Social History   Tobacco Use  . Smoking status: Former Smoker    Types: Cigarettes    Last attempt to quit: 09/24/2013    Years since quitting: 4.1  . Smokeless tobacco: Never Used  Substance Use Topics  . Alcohol use: No    Frequency: Never    FAMILY HISTORY:  Family History  Problem Relation Age of Onset  . Stroke  Mother     DRUG ALLERGIES: No Known Allergies  REVIEW OF SYSTEMS:   CONSTITUTIONAL: No fever, fatigue or weakness.  EYES: No blurred or double vision.  EARS, NOSE, AND THROAT: No tinnitus or ear pain.  RESPIRATORY: No cough, has shortness of breath,  No wheezing or hemoptysis.  CARDIOVASCULAR: No chest pain, orthopnea, edema. Right rib cage pain  GASTROINTESTINAL: No nausea, vomiting, diarrhea or abdominal pain.  GENITOURINARY: No dysuria, hematuria.  ENDOCRINE: No polyuria, nocturia,  HEMATOLOGY: No anemia, easy bruising or bleeding SKIN: No rash or lesion. MUSCULOSKELETAL: No joint pain or arthritis.   NEUROLOGIC: No tingling, numbness, weakness.  PSYCHIATRY: No anxiety or depression.   MEDICATIONS AT HOME:  Prior to Admission medications   Medication Sig Start Date End Date Taking? Authorizing Provider  Apremilast (OTEZLA) 30 MG TABS Take 30 mg by mouth daily with supper.    Yes [provider]  aspirin EC 81 MG EC tablet Take 1 tablet (81 mg total) by mouth daily. Patient taking differently: Take 81 mg by mouth at bedtime.  08/16/17  Yes Milagros LollSudini, Srikar, MD  atorvastatin (LIPITOR) 40 MG tablet Take 1 tablet (40 mg total) by mouth daily at 6 PM. 08/15/17  Yes Sudini, Srikar, MD  cetaphil (CETAPHIL) cream Apply 1 application topically 3 (three) times daily as needed (for psoriasis.).   Yes [provider]  hydrochlorothiazide (HYDRODIURIL) 12.5 MG tablet Take 12.5 mg by mouth daily.   Yes [provider]  sertraline (ZOLOFT) 50 MG tablet Take 50 mg by mouth at bedtime.  08/02/17  Yes [provider]  tamsulosin (FLOMAX) 0.4 MG CAPS capsule Take 0.4 mg by mouth daily at 2 PM.  06/03/17  Yes [provider]  triamcinolone cream (KENALOG) 0.1 % Apply 1 application topically 2 (two) times daily as needed (for psoriasis.).    Yes [provider]  clopidogrel (PLAVIX) 75 MG tablet Take 1 tablet (75 mg total) by mouth daily. Patient  taking differently: Take 75 mg by mouth at bedtime.  08/15/17   Milagros Loll, MD      PHYSICAL EXAMINATION:   VITAL SIGNS: Blood pressure 123/72, pulse 75, temperature 98.2 F (36.8 C), temperature source Oral, resp. rate 17, SpO2 97 %.  GENERAL:  84 y.o.-year-old patient lying in the bed with no acute distress.  EYES: Pupils equal, round, reactive to light and accommodation. No scleral icterus. Extraocular muscles intact.  HEENT: Head atraumatic, normocephalic. Oropharynx and nasopharynx clear.  NECK:  Supple, no jugular venous distention. No thyroid enlargement, no tenderness.  LUNGS: Decreased breath sounds right side, no wheezing, ,right sided crepitation heard. No use of accessory muscles of respiration.  Right rib cage pain CARDIOVASCULAR: S1, S2 normal. No murmurs, rubs, or gallops.  ABDOMEN: Soft, nontender, nondistended. Bowel sounds present. No organomegaly or mass.  EXTREMITIES: No pedal edema, cyanosis, or clubbing.  NEUROLOGIC: Cranial nerves II through XII are intact. Muscle strength 5/5 in all extremities. Sensation intact. Gait not checked.  PSYCHIATRIC: The patient is alert and oriented x 3.  SKIN: No obvious rash, lesion, or ulcer.   LABORATORY PANEL:   CBC Recent Labs  Lab 11/01/17 0946  WBC 14.8*  HGB 9.0*  HCT 27.4*  PLT 234  MCV 90.1  MCH 29.7  MCHC 32.9  RDW 15.2*  LYMPHSABS 0.6*  MONOABS 0.7  EOSABS 0.0  BASOSABS 0.0   ------------------------------------------------------------------------------------------------------------------  Chemistries  Recent Labs  Lab 11/01/17 0946  NA 131*  K 4.1  CL 95*  CO2 24  GLUCOSE 176*  BUN 28*  CREATININE 1.44*  CALCIUM 8.7*   ------------------------------------------------------------------------------------------------------------------ CrCl cannot be calculated (Unknown ideal  weight.). ------------------------------------------------------------------------------------------------------------------ No results for input(s): TSH, T4TOTAL, T3FREE, THYROIDAB in the last 72 hours.  Invalid input(s): FREET3   Coagulation profile No results for input(s): INR, PROTIME in the last 168 hours. ------------------------------------------------------------------------------------------------------------------- No results for input(s): DDIMER in the last 72 hours. -------------------------------------------------------------------------------------------------------------------  Cardiac Enzymes Recent Labs  Lab 11/01/17 0946  TROPONINI <0.03   ------------------------------------------------------------------------------------------------------------------ Invalid input(s): POCBNP  ---------------------------------------------------------------------------------------------------------------  Urinalysis    Component Value Date/Time   COLORURINE YELLOW (A) 08/14/2017 0730   APPEARANCEUR CLEAR (A) 08/14/2017 0730   LABSPEC 1.017 08/14/2017 0730   PHURINE 6.0 08/14/2017 0730   GLUCOSEU NEGATIVE 08/14/2017 0730   HGBUR NEGATIVE 08/14/2017 0730   BILIRUBINUR NEGATIVE 08/14/2017 0730   KETONESUR NEGATIVE 08/14/2017 0730   PROTEINUR NEGATIVE 08/14/2017 0730   NITRITE NEGATIVE 08/14/2017 0730   LEUKOCYTESUR NEGATIVE 08/14/2017 0730     RADIOLOGY: Dg Chest 2 View  Result Date: 11/01/2017 CLINICAL DATA:  Larey Seat yesterday. Right-sided rib pain. Cough and shortness of breath. EXAM: CHEST - 2 VIEW COMPARISON:  04/14/2010 FINDINGS: Heart size is normal. Chronic aortic atherosclerosis. Hydropneumothorax or hemo pneumothorax on the right. Amount of pleural air is 5% or less. Amount of pleural fluid is moderate, layering dependently with dependent pulmonary atelectasis. Multiple right  lower lateral rib fractures affecting the ninth, tenth and eleventh ribs at least. No sign of  spinal fracture. IMPRESSION: Multiple rib fractures in the lower right lateral rib region, probably ribs 9, 10 and 11 at least. Hydropneumothorax on the right. Small amount of pleural air, less than 5%. Dependent pulmonary atelectasis. Electronically Signed   By: Paulina Fusi M.D.   On: 11/01/2017 10:30   Ct Head Wo Contrast  Result Date: 11/01/2017 CLINICAL DATA:  Dizziness, fall. EXAM: CT HEAD WITHOUT CONTRAST TECHNIQUE: Contiguous axial images were obtained from the base of the skull through the vertex without intravenous contrast. COMPARISON:  CT scan of August 14, 2017. FINDINGS: Brain: Mild diffuse cortical atrophy is noted. Mild chronic ischemic white matter disease is noted. Old right cerebellar infarction is noted. No mass effect or midline shift is noted. Ventricular size is within normal limits. There is no evidence of mass lesion, hemorrhage or acute infarction. Vascular: No hyperdense vessel or unexpected calcification. Skull: Normal. Negative for fracture or focal lesion. Sinuses/Orbits: Left maxillary sinusitis is noted. Other: None. IMPRESSION: Mild diffuse cortical atrophy. Mild chronic ischemic white matter disease. Left maxillary sinusitis. No acute intracranial abnormality seen. Electronically Signed   By: Lupita Raider, M.D.   On: 11/01/2017 10:21    EKG: Orders placed or performed during the hospital encounter of 11/01/17  . ED EKG  . ED EKG  . EKG 12-Lead  . EKG 12-Lead    IMPRESSION AND PLAN:  84 year old male patient with history of hypertension, prostate hypertrophy, psoriasis presented to the emergency room with difficulty breathing and right rib cage pain after a fall.  1.Right-sided hydropneumothorax  oxygen via nasal cannula  serial chest x-rays , pulmonary consult  2.Right rib fractures Pain management with oral Percocet  3.Hypertension Resume thiazide diuretic for hypertension  4.Hyperlipidemia continue atorvastatin  5.DVT prophylaxis subcu Lovenox  40 mg daily  6.Hyponatremia Monitor electrolytes    All the records are reviewed and case discussed with ED provider. Management plans discussed with the patient, family and they are in agreement.  CODE STATUS:FULL CODE Code Status History    Date Active Date Inactive Code Status Order ID Comments User Context   10/02/2017 15:25 10/03/2017 17:56 Full Code [id number]  Annice Needy, MD Inpatient   08/14/2017 09:35 08/15/2017 22:14 Full Code [id number]  Milagros Loll, MD ED       TOTAL TIME TAKING CARE OF THIS PATIENT: 50 minutes.    Ihor Austin M.D on 11/01/2017 at 11:48 AM  Between 7am to 6pm - Pager - [phone]  After 6pm go to [email or web address] - password [email or web address] [facility]  [place] [facility]  Office  [phone]  CC: Primary care physician; Gracelyn Nurse, MD

## 2017-11-01 NOTE — Consult Note (Addendum)
[facility] Critical Care Medicine Consultation   ASSESSMENT/PLAN   Status post fall with right rib fractures. Patient most likely suffered from pulmonary contusion, most likely has pneumothorax. Chest x-ray reveals small apical pneumothorax along with right basilar effusion. Patient has been on aspirin and Plavix secondary to recent endarterectomy. He is feeling well at this time. Discussed with him about further imaging, possible drainage along with bronchodilators, incentive spirometry inadequate pain control. He does not wish to take bronchodilators, and at this point is not interested in aspirate,although patient may need IR directed aspiration at some point.  Stressed the importance of pain control with deep inspiration to prevent basilar atelectasis. Will obtain additional imaging studies to include CT scan and follow-up chest x-ray in the morning. We'll place on Solu-Medrol and will write for bronchodilators on an as-needed basis if he wishes to take some    INTAKE / OUTPUT:  Intake/Output Summary (Last 24 hours) at 11/01/2017 1623 Last data filed at 11/01/2017 1358 Gross per 24 hour  Intake 240 ml  Output -  Net 240 ml   re ---------------------------------------  ---------------------------------------   Name: Martin Bean MRN: [id number] DOB: 11/15/1929    ADMISSION DATE:  11/01/2017 CONSULTATION DATE: 11/01/2017  REFERRING MD :  Hospitalist  CHIEF COMPLAINT:  Status post fall   HISTORY OF PRESENT ILLNESS: Martin Bean is a very pleasant 84 year old Caucasian gentleman with a past medical history remarkable for hypertension, prostate hypertrophy, psoriasis, recent diagnosis of carotid stenosis, CVA, status post carotid endarterectomy on aspirin and Plavix, suffered a fall. States that he may have hit his ribs on a chair, suffered 2 broken ribs, small apical pneumothorax and some effusion noted on the right.Presently he is resting comfortably, on 2 L nasal cannula, in no  distress states he feels fine. He has a history of lifelong smoking, approximately 1 pack per day, quit 3 years ago. States he chronically wheezes but does not want to take any medication for it.  PAST MEDICAL HISTORY :  Past Medical History:  Diagnosis Date  . Family history of adverse reaction to anesthesia   . HTN (hypertension)   . Pneumonia   . Prostate enlargement   . Psoriasis    Past Surgical History:  Procedure Laterality Date  . ENDARTERECTOMY Left 10/02/2017   Procedure: ENDARTERECTOMY CAROTID;  Surgeon: Annice Bean, Martin S, MD;  Location: [facility] ORS;  Service: Vascular;  Laterality: Left;  . HERNIA REPAIR     Prior to Admission medications   Medication Sig Start Date End Date Taking? Authorizing Provider  Apremilast (OTEZLA) 30 MG TABS Take 30 mg by mouth daily with supper.    Yes [provider]  aspirin EC 81 MG EC tablet Take 1 tablet (81 mg total) by mouth daily. Patient taking differently: Take 81 mg by mouth at bedtime.  08/16/17  Yes Milagros LollSudini, Srikar, MD  atorvastatin (LIPITOR) 40 MG tablet Take 1 tablet (40 mg total) by mouth daily at 6 PM. 08/15/17  Yes Sudini, Srikar, MD  cetaphil (CETAPHIL) cream Apply 1 application topically 3 (three) times daily as needed (for psoriasis.).   Yes [provider]  hydrochlorothiazide (HYDRODIURIL) 12.5 MG tablet Take 12.5 mg by mouth daily.   Yes [provider]  sertraline (ZOLOFT) 50 MG tablet Take 50 mg by mouth at bedtime.  08/02/17  Yes [provider]  tamsulosin (FLOMAX) 0.4 MG CAPS capsule Take 0.4 mg by mouth daily at 2 PM.  06/03/17  Yes [provider]  triamcinolone cream (  KENALOG) 0.1 % Apply 1 application topically 2 (two) times daily as needed (for psoriasis.).    Yes [provider]  clopidogrel (PLAVIX) 75 MG tablet Take 1 tablet (75 mg total) by mouth daily. Patient taking differently: Take 75 mg by mouth at bedtime.  08/15/17   Milagros Loll, MD   No Known  Allergies  FAMILY HISTORY:  Family History  Problem Relation Age of Onset  . Stroke Mother    SOCIAL HISTORY:  reports that he quit smoking about 4 years ago. His smoking use included cigarettes. he has never used smokeless tobacco. He reports that he does not drink alcohol or use drugs.  REVIEW OF SYSTEMS:    The remainder of systems were reviewed and were found to be negative other than what is documented in the HPI.    VITAL SIGNS: Temp:  [97.4 F (36.3 C)-98.2 F (36.8 C)] 97.4 F (36.3 C) (03/15 1228) Pulse Rate:  [75-88] 85 (03/15 1228) Resp:  [16-21] 21 (03/15 1228) BP: (119-175)/(67-72) 175/71 (03/15 1228) SpO2:  [96 %-99 %] 99 % (03/15 1228) Weight:  [62.8 kg (138 lb 8 oz)] 62.8 kg (138 lb 8 oz) (03/15 1235) HEMODYNAMICS:  INTAKE / OUTPUT:  Intake/Output Summary (Last 24 hours) at 11/01/2017 1623 Last data filed at 11/01/2017 1358 Gross per 24 hour  Intake 240 ml  Output -  Net 240 ml    Physical Examination:   VS: BP (!) 175/71 (BP Location: Right Arm)   Pulse 85   Temp (!) 97.4 F (36.3 C) (Oral)   Resp (!) 21   Ht 5\' 11"  (1.803 m)   Wt 62.8 kg (138 lb 8 oz)   SpO2 99%   BMI 19.32 kg/m   General Appearance: No distress  Neuro:without focal findings, mental status, speech normal,. HEENT: PERRLA, EOM intact, no ptosis, no other lesions noticed;  Pulmonary: expiratory rhonchi noted bilaterally. Diminished breath sounds of his right costophrenic angle with focal pain to palpation of his rightcostal margin. CardiovascularNormal S1,S2.  No m/r/g.    Abdomen: Benign, Soft, non-tender, No masses, hepatosplenomegaly, No lymphadenopathy Renal:  No costovertebral tenderness  Extremities: normal, no cyanosis, clubbing, no edema, warm with normal capillary refill.    LABS: Reviewed   LABORATORY PANEL:   CBC Recent Labs  Lab 11/01/17 0946  WBC 14.8*  HGB 9.0*  HCT 27.4*  PLT 234    Chemistries  Recent Labs  Lab 11/01/17 0946  NA 131*  K 4.1   CL 95*  CO2 24  GLUCOSE 176*  BUN 28*  CREATININE 1.44*  CALCIUM 8.7*    No results for input(s): GLUCAP in the last 168 hours. No results for input(s): PHART, PCO2ART, PO2ART in the last 168 hours. No results for input(s): AST, ALT, ALKPHOS, BILITOT, ALBUMIN in the last 168 hours.  Cardiac Enzymes Recent Labs  Lab 11/01/17 0946  TROPONINI <0.03    RADIOLOGY:  Dg Chest 2 View  Result Date: 11/01/2017 CLINICAL DATA:  84 year old male status post fall yesterday. Right lower rib fractures and small hydropneumothorax demonstrated on chest radiographs earlier today. EXAM: CHEST - 2 VIEW COMPARISON:  1006 hours today, and earlier. FINDINGS: Comminuted and mildly displaced right lower rib fractures re-demonstrated. A small right pneumothorax persists at the lung apex. There is a moderate size right pleural effusion. These appear stable from earlier today. Stable cardiac size and mediastinal contours. Stable left lung. Calcified aortic atherosclerosis. Visualized tracheal air column is within normal limits. Osteopenia. Stable visible bowel gas  pattern. IMPRESSION: 1. Comminuted right lower rib fractures. A moderate right pleural effusion (likely Hemothorax) and small superimposed right apical pneumothorax appears stable since 1006 hours today. 2. No new cardiopulmonary abnormality identified. Electronically Signed   By: Odessa Fleming M.D.   On: 11/01/2017 15:32   Dg Chest 2 View  Result Date: 11/01/2017 CLINICAL DATA:  Larey Seat yesterday. Right-sided rib pain. Cough and shortness of breath. EXAM: CHEST - 2 VIEW COMPARISON:  04/14/2010 FINDINGS: Heart size is normal. Chronic aortic atherosclerosis. Hydropneumothorax or hemo pneumothorax on the right. Amount of pleural air is 5% or less. Amount of pleural fluid is moderate, layering dependently with dependent pulmonary atelectasis. Multiple right lower lateral rib fractures affecting the ninth, tenth and eleventh ribs at least. No sign of spinal fracture.  IMPRESSION: Multiple rib fractures in the lower right lateral rib region, probably ribs 9, 10 and 11 at least. Hydropneumothorax on the right. Small amount of pleural air, less than 5%. Dependent pulmonary atelectasis. Electronically Signed   By: Paulina Fusi M.D.   On: 11/01/2017 10:30   Ct Head Wo Contrast  Result Date: 11/01/2017 CLINICAL DATA:  Dizziness, fall. EXAM: CT HEAD WITHOUT CONTRAST TECHNIQUE: Contiguous axial images were obtained from the base of the skull through the vertex without intravenous contrast. COMPARISON:  CT scan of August 14, 2017. FINDINGS: Brain: Mild diffuse cortical atrophy is noted. Mild chronic ischemic white matter disease is noted. Old right cerebellar infarction is noted. No mass effect or midline shift is noted. Ventricular size is within normal limits. There is no evidence of mass lesion, hemorrhage or acute infarction. Vascular: No hyperdense vessel or unexpected calcification. Skull: Normal. Negative for fracture or focal lesion. Sinuses/Orbits: Left maxillary sinusitis is noted. Other: None. IMPRESSION: Mild diffuse cortical atrophy. Mild chronic ischemic white matter disease. Left maxillary sinusitis. No acute intracranial abnormality seen. Electronically Signed   By: Lupita Raider, M.D.   On: 11/01/2017 10:21    Tora Kindred, DO  11/01/2017, 4:23 PM

## 2017-11-01 NOTE — Progress Notes (Signed)
Pulmonology consult should have already been called by admitting physician as this was an urgent consult in the E.R. However, did page Dr. Tobi BastosPyreddy just to confirm. Awaiting possible response. Jari FavreSteven M Pueblo [facility] LLCmhoff

## 2017-11-01 NOTE — Progress Notes (Signed)
Advanced care plan.  Purpose of the Encounter: CODE STATUS  Parties in Attendance: patient and family members  Patient's Decision Capacity: Good  Subjective/Patient's story: Presented for fall and pain in the right rib cage    Objective/Medical story : Has right rib fractures and hydropneumothorax    Goals of care determination: Discussed cardiac resuscitation and intubation and mechanical ventilation if need arises. Patient wants everything done.    CODE STATUS: FULL CODE   Time spent discussing advanced care planning: 16 minutes

## 2017-11-01 NOTE — ED Provider Notes (Addendum)
[facility] Emergency Department Provider Note  ____________________________________________   I have reviewed the triage vital signs and the nursing notes. Where available I have reviewed prior notes and, if possible and indicated, outside hospital notes.    HISTORY  Chief Complaint Shortness of Breath and Fall    HPI Martin Bean is a 84 y.o. male who presents today complaining of cough and rib pain.  Patient has been coughing according to family for several days at least.  He states "a little bit" family feels this a lot.  In any event, he had a fall yesterday.  He states he got up and felt lightheaded and fell.  He is adamant he did not significantly hit his head although family notes that he did bump his head, he did not pass out, but he did bang the right side of his rib cage and has pain in his right ribs which makes it more painful to cough.  He states as a result he has been somewhat short of breath.  He has not had any fever.  He did get a DuoNeb from EMS on the way in which made him feel little bit better, otherwise no antecedent treatment.  He denies leg swelling or chest pain aside from the various particular spot that hurts where he fell. He is not on oxygen at home, when he arrived his room air sats were in the mid 80s.  No history of heart failure.  Positive history of pneumonia in the past.    Past Medical History:  Diagnosis Date  . Family history of adverse reaction to anesthesia   . HTN (hypertension)   . Pneumonia   . Prostate enlargement   . Psoriasis     Patient Active Problem List   Diagnosis Date Noted  . Essential hypertension 10/09/2017  . Hyperlipidemia 10/09/2017  . Carotid arterial disease (HCC) 10/02/2017  . CVA (cerebral vascular accident) (HCC) 08/14/2017    Past Surgical History:  Procedure Laterality Date  . ENDARTERECTOMY Left 10/02/2017   Procedure: ENDARTERECTOMY CAROTID;  Surgeon: Annice Needy, MD;  Location:  [facility] ORS;  Service: Vascular;  Laterality: Left;  . HERNIA REPAIR      Prior to Admission medications   Medication Sig Start Date End Date Taking? Authorizing Provider  Apremilast (OTEZLA) 30 MG TABS Take 30 mg by mouth daily with supper.     [provider]  aspirin EC 81 MG EC tablet Take 1 tablet (81 mg total) by mouth daily. Patient taking differently: Take 81 mg by mouth at bedtime.  08/16/17   Milagros Loll, MD  atorvastatin (LIPITOR) 40 MG tablet Take 1 tablet (40 mg total) by mouth daily at 6 PM. 08/15/17   Sudini, Wardell Heath, MD  cetaphil (CETAPHIL) cream Apply 1 application topically 3 (three) times daily as needed (for psoriasis.).    [provider]  clopidogrel (PLAVIX) 75 MG tablet Take 1 tablet (75 mg total) by mouth daily. Patient taking differently: Take 75 mg by mouth at bedtime.  08/15/17   Milagros Loll, MD  hydrochlorothiazide (MICROZIDE) 12.5 MG capsule Take 12.5 mg by mouth daily.    [provider]  sertraline (ZOLOFT) 50 MG tablet Take 50 mg by mouth at bedtime.  08/02/17   [provider]  tamsulosin (FLOMAX) 0.4 MG CAPS capsule Take 0.4 mg by mouth daily at 2 PM.  06/03/17   [provider]  triamcinolone cream (KENALOG) 0.1 % Apply 1 application topically 2 (two) times  daily as needed (for psoriasis.).     [provider]    Allergies Patient has no known allergies.  Family History  Problem Relation Age of Onset  . Stroke Mother     Social History Social History   Tobacco Use  . Smoking status: Former Smoker    Types: Cigarettes    Last attempt to quit: 09/24/2013    Years since quitting: 4.1  . Smokeless tobacco: Never Used  Substance Use Topics  . Alcohol use: No    Frequency: Never  . Drug use: No    Review of Systems Constitutional: No fever/chills Eyes: No visual changes. ENT: No sore throat. No stiff neck no neck pain Cardiovascular: See HPI regarding chest pain. Respiratory: See HPI  regarding shortness of breath. Gastrointestinal:   no vomiting.  No diarrhea.  No constipation. Genitourinary: Negative for dysuria. Musculoskeletal: Negative lower extremity swelling Skin: Negative for rash. Neurological: Negative for severe headaches, focal weakness or numbness.   ____________________________________________   PHYSICAL EXAM:  VITAL SIGNS: ED Triage Vitals  Enc Vitals Group     BP 11/01/17 0917 119/67     Pulse Rate 11/01/17 0917 88     Resp 11/01/17 0917 16     Temp --      Temp src --      SpO2 11/01/17 0914 96 %     Weight --      Height --      Head Circumference --      Peak Flow --      Pain Score 11/01/17 0918 4     Pain Loc --      Pain Edu? --      Excl. in GC? --     Constitutional: Alert and oriented unsure of the exact date.  Chronically ill-appearing gentleman in no acute distress at this moment but with some mild respiratory compromise Eyes: Conjunctivae are normal Head: Atraumatic HEENT: No congestion/rhinnorhea. Mucous membranes are moist.  Oropharynx non-erythematous Neck:   Nontender with no meningismus, no masses, no stridor Cardiovascular: Normal rate, regular rhythm. Grossly normal heart sounds.  Good peripheral circulation. Chest: Tender to palpation in the right chest wall no crepitus no flail chest no obvious rib fracture palpated, there is specifically no tenderness to palpation in the caudal region underneath the right rib cage with his liver is Respiratory: No increased work of breathing, diffuse rhonchi appreciated, Abdominal: Soft and nontender. No distention. No guarding no rebound Back:  There is no focal tenderness or step off.  there is no midline tenderness there are no lesions noted. there is no CVA tenderness Musculoskeletal: No lower extremity tenderness, no upper extremity tenderness. No joint effusions, no DVT signs strong distal pulses no edema Neurologic:  Normal speech and language. No gross focal neurologic  deficits are appreciated.  Skin:  Skin is warm, dry and intact.  Skin tear noted to the back of the hand, there is also slight abrasion to the bridge of the nose.Marland Kitchen Psychiatric: Mood and affect are normal. Speech and behavior are normal.  ____________________________________________   LABS (all labs ordered are listed, but only abnormal results are displayed)  Labs Reviewed  CULTURE, BLOOD (ROUTINE X 2)  CULTURE, BLOOD (ROUTINE X 2)  CBC WITH DIFFERENTIAL/PLATELET  BASIC METABOLIC PANEL  TROPONIN I  BRAIN NATRIURETIC PEPTIDE  INFLUENZA PANEL BY PCR (TYPE A & B)    Pertinent labs  results that were available during my care of the patient were reviewed by me  and considered in my medical decision making (see chart for details). ____________________________________________  EKG  I personally interpreted any EKGs ordered by me or triage Sinus rhythm rate 74 bpm no acute ST elevation or depression, flipped T waves noted right bundle branch noted, normal axis, mildly limited EKG by baseline artifact ____________________________________________  RADIOLOGY  Pertinent labs & imaging results that were available during my care of the patient were reviewed by me and considered in my medical decision making (see chart for details). If possible, patient and/or family made aware of any abnormal findings.  No results found. ____________________________________________    PROCEDURES  Procedure(s) performed: None  Procedures  Critical Care performed: CRITICAL CARE Performed by: Jeanmarie Plant   Total critical care time: 38 minutes  Critical care time was exclusive of separately billable procedures and treating other patients.  Critical care was necessary to treat or prevent imminent or life-threatening deterioration.  Critical care was time spent personally by me on the following activities: development of treatment plan with patient and/or surrogate as well as nursing, discussions  with consultants, evaluation of patient's response to treatment, examination of patient, obtaining history from patient or surrogate, ordering and performing treatments and interventions, ordering and review of laboratory studies, ordering and review of radiographic studies, pulse oximetry and re-evaluation of patient's condition.   ____________________________________________   INITIAL IMPRESSION / ASSESSMENT AND PLAN / ED COURSE  Pertinent labs & imaging results that were available during my care of the patient were reviewed by me and considered in my medical decision making (see chart for details).  Patient here after a fall which was occasioned by being lightheaded, we will obtain CT the head I do not see any evidence of obvious hip or other injury of any significance.  He does have some skin tears. , Patient has diffuse rhonchorous breath sounds, and a new oxygen requirement, most likely this is a pneumonia, this is probably actually why he fell.  Will obtain chest x-ray.  He does have rib pain which is other fracture or bruised rib.  No evidence of injury to the spleen underneath. Blood cultures and blood work will be obtained, we will keep the patient on oxygen,  Abdomen is benign  Patient likely will require admission.  ----------------------------------------- 10:38 AM on 11/01/2017 -----------------------------------------  Patient with a tiny pneumothorax which after 24 hours probably does not require acute chest tube.  There is an effusion on that side as well but again after 24 hours of not certain that a chest tube in a 84 year old male would be of great utility in terms of increasing any of his pulmonary status.  We are giving him pain medication, I will give him empiric antibiotics as I do believe clinically he has a lung infection prior to this which is only going to get worse from the splinting he has been doing, he will need to be admitted.      ----------------------------------------- 11:09 AM on 11/01/2017 -----------------------------------------  Patient in no acute distress at this time resting comfortably on oxygen.  He did fall 2020 4 hours ago.  He has a 5% pneumothorax according to radiology and possibly a small effusion possibly trauma related.  I have personally looked at the x-ray.  Even that he is 24 hours out and the pneumothorax is still trivial, I think it is likely that a chest tube will not help him especially given his hemoglobin is stable and again this is 24 hours from the accident.  There is significant  morbidity to placing a chest tube in any 84-year-old male, I did discuss all the risk benefits and alternatives with the family, I did express to him I am happy to put it in but at this time I think it would be better offer him perhaps not to have it done.  Patient and family agree they would prefer not to have this procedure performed if it can be avoided in this elderly gentleman.  I think the chances of him making him significantly better are slim and there are significant morbidities to it.  They did understand that if he decompensates he may require that tube placed later and they are comfortable with that.  I have talked as well to the hospitalist doctor, who agrees with management and will admit. ____________________________________________   FINAL CLINICAL IMPRESSION(S) / ED DIAGNOSES  Final diagnoses:  None      This chart was dictated using voice recognition software.  Despite best efforts to proofread,  errors can occur which can change meaning.      Jeanmarie PlantMcShane, Fronia Depass A, MD 11/01/17 [id number]    Jeanmarie PlantMcShane, Dalaya Suppa A, MD 11/01/17 1039    Jeanmarie PlantMcShane, Lailanie Hasley A, MD 11/01/17 1111    Jeanmarie PlantMcShane, Jamae Tison A, MD 11/01/17 1121

## 2017-11-02 ENCOUNTER — Inpatient Hospital Stay: Payer: Medicare PPO

## 2017-11-02 ENCOUNTER — Encounter: Payer: Self-pay | Admitting: Internal Medicine

## 2017-11-02 DIAGNOSIS — E440 Moderate protein-calorie malnutrition: Secondary | ICD-10-CM

## 2017-11-02 HISTORY — DX: Moderate protein-calorie malnutrition: E44.0

## 2017-11-02 LAB — BASIC METABOLIC PANEL
ANION GAP: 11 (ref 5–15)
BUN: 36 mg/dL — ABNORMAL HIGH (ref 6–20)
CHLORIDE: 96 mmol/L — AB (ref 101–111)
CO2: 25 mmol/L (ref 22–32)
Calcium: 8.9 mg/dL (ref 8.9–10.3)
Creatinine, Ser: 1.47 mg/dL — ABNORMAL HIGH (ref 0.61–1.24)
GFR calc non Af Amer: 42 mL/min — ABNORMAL LOW (ref >60)
GFR, EST AFRICAN AMERICAN: 48 mL/min — AB (ref >60)
GLUCOSE: 182 mg/dL — AB (ref 65–99)
Potassium: 4.8 mmol/L (ref 3.5–5.1)
Sodium: 132 mmol/L — ABNORMAL LOW (ref 135–145)

## 2017-11-02 LAB — CBC WITH DIFFERENTIAL/PLATELET
BASOS ABS: 0 10*3/uL (ref 0–0.1)
Basophils Relative: 0 %
Eosinophils Absolute: 0 10*3/uL (ref 0–0.7)
Eosinophils Relative: 0 %
HEMATOCRIT: 24.8 % — AB (ref 40.0–52.0)
HEMOGLOBIN: 8.1 g/dL — AB (ref 13.0–18.0)
LYMPHS ABS: 0.5 10*3/uL — AB (ref 1.0–3.6)
Lymphocytes Relative: 2 %
MCH: 29.2 pg (ref 26.0–34.0)
MCHC: 32.6 g/dL (ref 32.0–36.0)
MCV: 89.5 fL (ref 80.0–100.0)
Monocytes Absolute: 0.3 10*3/uL (ref 0.2–1.0)
Monocytes Relative: 2 %
NEUTROS ABS: 18.5 10*3/uL — AB (ref 1.4–6.5)
Neutrophils Relative %: 96 %
Platelets: 207 10*3/uL (ref 150–440)
RBC: 2.78 MIL/uL — AB (ref 4.40–5.90)
RDW: 15 % — ABNORMAL HIGH (ref 11.5–14.5)
WBC: 19.3 10*3/uL — AB (ref 3.8–10.6)

## 2017-11-02 LAB — PROTIME-INR
INR: 1.24
Prothrombin Time: 15.5 seconds — ABNORMAL HIGH (ref 11.4–15.2)

## 2017-11-02 MED ORDER — SODIUM CHLORIDE 0.9 % IV SOLN
1.0000 g | INTRAVENOUS | Status: DC
  Administered 2017-11-02 – 2017-11-06 (×5): 1 g via INTRAVENOUS
  Filled 2017-11-02 (×5): qty 10

## 2017-11-02 MED ORDER — SODIUM CHLORIDE 0.9 % IV SOLN
INTRAVENOUS | Status: AC
  Administered 2017-11-02 – 2017-11-03 (×2): via INTRAVENOUS

## 2017-11-02 MED ORDER — SODIUM CHLORIDE 0.9 % IV SOLN
500.0000 mg | INTRAVENOUS | Status: DC
  Administered 2017-11-02 – 2017-11-04 (×3): 500 mg via INTRAVENOUS
  Filled 2017-11-02 (×3): qty 500

## 2017-11-02 NOTE — Plan of Care (Signed)
Pt is A&Ox4. VSS . RA . IVF initiated per order. IV antibiotics given per order. Family at bedside. NSR on monitor. No complaints thus far. Will continue to monitor and report to oncoming RN .  Progressing Education: Knowledge of General Education information will improve 11/02/2017 1333 - Progressing by Jodie EchevariaWhite, Delbert Darley L, RN Health Behavior/Discharge Planning: Ability to manage health-related needs will improve 11/02/2017 1333 - Progressing by Jodie EchevariaWhite, Shaina Gullatt L, RN Clinical Measurements: Ability to maintain clinical measurements within normal limits will improve 11/02/2017 1333 - Progressing by Jodie EchevariaWhite, Demarrius Guerrero L, RN Will remain free from infection 11/02/2017 1333 - Progressing by Jodie EchevariaWhite, Brittane Grudzinski L, RN Diagnostic test results will improve 11/02/2017 1333 - Progressing by Jodie EchevariaWhite, Anorah Trias L, RN Respiratory complications will improve 11/02/2017 1333 - Progressing by Jodie EchevariaWhite, Kevork Joyce L, RN Cardiovascular complication will be avoided 11/02/2017 1333 - Progressing by Jodie EchevariaWhite, Hazen Brumett L, RN Activity: Risk for activity intolerance will decrease 11/02/2017 1333 - Progressing by Jodie EchevariaWhite, Minervia Osso L, RN Nutrition: Adequate nutrition will be maintained 11/02/2017 1333 - Progressing by Jodie EchevariaWhite, Nielle Duford L, RN Coping: Level of anxiety will decrease 11/02/2017 1333 - Progressing by Jodie EchevariaWhite, Valdez Brannan L, RN Elimination: Will not experience complications related to bowel motility 11/02/2017 1333 - Progressing by Jodie EchevariaWhite, Chon Buhl L, RN Will not experience complications related to urinary retention 11/02/2017 1333 - Progressing by Jodie EchevariaWhite, Kadedra Vanaken L, RN Pain Managment: General experience of comfort will improve 11/02/2017 1333 - Progressing by Jodie EchevariaWhite, Zaviyar Rahal L, RN Safety: Ability to remain free from injury will improve 11/02/2017 1333 - Progressing by Jodie EchevariaWhite, Jrue Yambao L, RN Skin Integrity: Risk for impaired skin integrity will decrease 11/02/2017 1333 - Progressing by Jodie EchevariaWhite, Kyia Rhude L, RN

## 2017-11-02 NOTE — Progress Notes (Signed)
[employer] - [place] [facility]   PATIENT NAME: Martin Bean    MR#:  [id number]  DATE OF BIRTH:  03/06/1930  SUBJECTIVE:   Patient with minimal rib pain deneis SOB  REVIEW OF SYSTEMS:    Review of Systems  Constitutional: Negative for fever, chills weight loss HENT: Negative for ear pain, nosebleeds, congestion, facial swelling, rhinorrhea, neck pain, neck stiffness and ear discharge.   Respiratory: Negative for cough, shortness of breath, wheezing  Cardiovascular: Negative for chest pain, palpitations and leg swelling.  Gastrointestinal: Negative for heartburn, abdominal pain, vomiting, diarrhea or consitpation Genitourinary: Negative for dysuria, urgency, frequency, hematuria Musculoskeletal: Negative for back pain or joint pain Neurological: Negative for dizziness, seizures, syncope, focal weakness,  numbness and headaches.  Hematological: Does not bruise/bleed easily.  Psychiatric/Behavioral: Negative for hallucinations, confusion, dysphoric mood    Tolerating Diet: yes      DRUG ALLERGIES:  No Known Allergies  VITALS:  Blood pressure 138/62, pulse 68, temperature 98.3 F (36.8 C), temperature source Oral, resp. rate 18, height 5\' 11"  (1.803 m), weight 62.8 kg (138 lb 8 oz), SpO2 98 %.  PHYSICAL EXAMINATION:  Constitutional: Appears well-developed and well-nourished. No distress. HENT: Normocephalic. Marland Kitchen. Oropharynx is clear and moist.  Eyes: Conjunctivae and EOM are normal. PERRLA, no scleral icterus.  Neck: Normal ROM. Neck supple. No JVD. No tracheal deviation. CVS: RRR, S1/S2 +, no murmurs, no gallops, no carotid bruit.  Pulmonary:  Normal respiratory effort with decreased breath sounds right base abdominal: Soft. BS +,  no distension, tenderness, rebound or guarding.  Musculoskeletal: Normal range of motion. No edema and no tenderness.  Neuro: Alert. CN 2-12 grossly intact. No focal deficits. Skin: Skin is warm and dry. No rash  noted. Psychiatric: Normal mood and affect.      LABORATORY PANEL:   CBC Recent Labs  Lab 11/02/17 0714  WBC 19.3*  HGB 8.1*  HCT 24.8*  PLT 207   ------------------------------------------------------------------------------------------------------------------  Chemistries  Recent Labs  Lab 11/02/17 0714  NA 132*  K 4.8  CL 96*  CO2 25  GLUCOSE 182*  BUN 36*  CREATININE 1.47*  CALCIUM 8.9   ------------------------------------------------------------------------------------------------------------------  Cardiac Enzymes Recent Labs  Lab 11/01/17 0946  TROPONINI <0.03   ------------------------------------------------------------------------------------------------------------------  RADIOLOGY:  Dg Chest 2 View  Result Date: 11/02/2017 CLINICAL DATA:  Known right rib fracture and hemothorax. EXAM: CHEST - 2 VIEW COMPARISON:  11/01/2017 FINDINGS: Stable less than 5% right apical pneumothorax. Stable opacity at the right base likely combination of pleural fluid and airspace consolidation. Tiny left pleural effusion. No left pneumothorax. Borderline cardiomegaly. IMPRESSION: Stable less than 5% right apical pneumothorax Stable right basilar opacity. Electronically Signed   By: Jolaine ClickArthur  Hoss M.D.   On: 11/02/2017 09:16   Dg Chest 2 View  Result Date: 11/01/2017 CLINICAL DATA:  84 year old male status post fall yesterday. Right lower rib fractures and small hydropneumothorax demonstrated on chest radiographs earlier today. EXAM: CHEST - 2 VIEW COMPARISON:  1006 hours today, and earlier. FINDINGS: Comminuted and mildly displaced right lower rib fractures re-demonstrated. A small right pneumothorax persists at the lung apex. There is a moderate size right pleural effusion. These appear stable from earlier today. Stable cardiac size and mediastinal contours. Stable left lung. Calcified aortic atherosclerosis. Visualized tracheal air column is within normal limits. Osteopenia.  Stable visible bowel gas pattern. IMPRESSION: 1. Comminuted right lower rib fractures. A moderate right pleural effusion (likely Hemothorax) and small superimposed right apical pneumothorax appears stable since  1006 hours today. 2. No new cardiopulmonary abnormality identified. Electronically Signed   By: Odessa Fleming M.D.   On: 11/01/2017 15:32   Dg Chest 2 View  Result Date: 11/01/2017 CLINICAL DATA:  Larey Seat yesterday. Right-sided rib pain. Cough and shortness of breath. EXAM: CHEST - 2 VIEW COMPARISON:  04/14/2010 FINDINGS: Heart size is normal. Chronic aortic atherosclerosis. Hydropneumothorax or hemo pneumothorax on the right. Amount of pleural air is 5% or less. Amount of pleural fluid is moderate, layering dependently with dependent pulmonary atelectasis. Multiple right lower lateral rib fractures affecting the ninth, tenth and eleventh ribs at least. No sign of spinal fracture. IMPRESSION: Multiple rib fractures in the lower right lateral rib region, probably ribs 9, 10 and 11 at least. Hydropneumothorax on the right. Small amount of pleural air, less than 5%. Dependent pulmonary atelectasis. Electronically Signed   By: Paulina Fusi M.D.   On: 11/01/2017 10:30   Ct Head Wo Contrast  Result Date: 11/01/2017 CLINICAL DATA:  Dizziness, fall. EXAM: CT HEAD WITHOUT CONTRAST TECHNIQUE: Contiguous axial images were obtained from the base of the skull through the vertex without intravenous contrast. COMPARISON:  CT scan of August 14, 2017. FINDINGS: Brain: Mild diffuse cortical atrophy is noted. Mild chronic ischemic white matter disease is noted. Old right cerebellar infarction is noted. No mass effect or midline shift is noted. Ventricular size is within normal limits. There is no evidence of mass lesion, hemorrhage or acute infarction. Vascular: No hyperdense vessel or unexpected calcification. Skull: Normal. Negative for fracture or focal lesion. Sinuses/Orbits: Left maxillary sinusitis is noted. Other: None.  IMPRESSION: Mild diffuse cortical atrophy. Mild chronic ischemic white matter disease. Left maxillary sinusitis. No acute intracranial abnormality seen. Electronically Signed   By: Lupita Raider, M.D.   On: 11/01/2017 10:21   Ct Chest Wo Contrast  Result Date: 11/02/2017 CLINICAL DATA:  Fall yesterday EXAM: CT CHEST WITHOUT CONTRAST TECHNIQUE: Multidetector CT imaging of the chest was performed following the standard protocol without IV contrast. COMPARISON:  None. FINDINGS: Cardiovascular: Prominent atherosclerotic calcifications in the aortic arch and great vessels. There is no evidence of intramural hematoma or aortic aneurysm. Moderate LAD territory coronary artery calcification. Mediastinum/Nodes: No pericardial effusion. No abnormal mediastinal adenopathy. No evidence of mediastinal hemorrhage. Thyroid is within normal limits. Esophagus is unremarkable. A tiny amount of gas adjacent to the right side of the trachea is noted. Medial and apical right pneumothorax is favored over pneumomediastinum. There is otherwise no evidence of pneumomediastinum. Lungs/Pleura: A moderate right pleural effusion is present with mixed low-density and high-density areas. This is consistent with an involving hemothorax. There is associated compressive atelectasis in the posterior right lung. A 5% anterior right pneumothorax is present. The right lower lobe lobar bronchial airway becomes occluded. See image 86 of series 3 there is mild thickening of the central and secondary airways towards the right lung base. There is similar thickening of the central airways in the left lung primarily affecting the lower lobe and lingula. Mild patchy density at the posterior left lung base. No definite left pleural effusion. No left pneumothorax. Tiny blebs at the left apex. Upper Abdomen: No acute abnormality. Musculoskeletal: Multiple posterior, lateral and lower right rib fractures are present. There are minimally displaced. There is  emphysema within the chest wall soft tissues related to the rib fractures. No obvious left-sided rib fractures. Minimal T12 wedge compression deformity has a chronic appearance. No definite acute vertebral fracture. No sternal fracture. IMPRESSION: Multiple right rib fractures is  associated with a 5% right pneumothorax and a moderate-sized right hemothorax. Right lower lobe bronchial airways occluded and may contain blood or debris. Minimal patchy airspace disease at the posterior left lung base. Aortic Atherosclerosis (ICD10-I70.0). Electronically Signed   By: Jolaine Click M.D.   On: 11/02/2017 09:26     ASSESSMENT AND PLAN:    84 year old male with history of essential hypertension and BPH who presented after a fall with shortness of breath.   1.  Moderate sized right hemothorax with atelectasis and small apical hydropneumothorax: Plan for interventional radiology to perform ultrasound-guided drainage of right pleural space Continue incentive spirometer Appreciate pulmonary consultation Continue oxygen for pneumothorax   2.  Pneumonia from atelectasis: Continue Rocephin and azithromycin  3.  Multiple right rib fractures: Continue pain management Physical therapy consultation requested for tomorrow  4.  Hyponatremia in the setting of pneumonia and hemothorax Stop HCTZ Gentle hydration Follow BMP  5.  Essential hypertension: Due to low sodium I have discontinued HCTZ Follow blood pressure  6.  BPH: Continue tamsulosin  7.  Acute kidney injury in the setting of rib fractures and dehydration Stop HCTZ Start IV fluids BMP in a.m.  8.  Acute on chronic anemia due to hydropneumothorax CBC for a.m. No indication for blood transfusion today  9.  Leukocytosis: This may be related to steroids that were given for underlying pneumonia Restart Rocephin and azithromycin Stop IV steroids as there is no indication at this time Repeat CBC in a.m.    Management plans discussed with  the patient and he is in agreement.  CODE STATUS: FULL  TOTAL TIME TAKING CARE OF THIS PATIENT: 30 minutes.     POSSIBLE D/C 2 days, DEPENDING ON CLINICAL CONDITION.   Elnora Quizon M.D on 11/02/2017 at 10:19 AM  Between 7am to 6pm - Pager - [phone] After 6pm go to [email or web address] - password [employer]  [facility]  Office  [phone]  CC: Primary care physician; Gracelyn Nurse, MD  Note: This dictation was prepared with Dragon dictation along with smaller phrase technology. Any transcriptional errors that result from this process are unintentional.

## 2017-11-02 NOTE — Plan of Care (Signed)
  Pain Managment: General experience of comfort will improve 11/02/2017 0127 - Progressing by Stefan Churchogers, Linton Stolp M, RN   Safety: Ability to remain free from injury will improve 11/02/2017 0127 - Progressing by Stefan Churchogers, Taisei Bonnette M, RN

## 2017-11-02 NOTE — Progress Notes (Addendum)
Pulmonary/critical care  Mr. Martin Bean states he feels well today. Had a good night last night. No change in shortness of breath.  Vital signs noted Cardiovascular: Regular rate and rhythm Pulmonary: Diminished breath sounds in the right lung base. Positive aeration in the rightanterior and upper lung fields Abdominal: Positive bowel sounds Extremities: Martin Bean cyanosis or edema noted  CT scan of the chest performed reveals moderate sized right hemothorax along with some compressive atelectasis in the right lung base with a small apical hydropneumothorax.  Would recommend interventional radiology ultrasound-guided drainage of right pleural space, to avoid compressive atelectasis and trapped lung. No further extension of pleural fluid or actively bleeding assessed by stability of CXR.  Discussed with patient who is agreeable. We'll place consult to IR  [name], D.O.

## 2017-11-02 NOTE — Plan of Care (Signed)
  Clinical Measurements: Respiratory complications will improve 11/02/2017 2043 - Progressing by Kalman JewelsBallentine, Dorris Pierre, RN Cardiovascular complication will be avoided 11/02/2017 2043 - Progressing by Kalman JewelsBallentine, Edd Reppert, RN

## 2017-11-02 NOTE — Consult Note (Signed)
Pharmacy Antibiotic Note  Martin Bean is a 84 y.o. male admitted on 11/01/2017 with pneumonia.  Pharmacy has been consulted for Ceftriaxone dosing. Patient is also receiving azithromycin.   Plan: Start ceftriaxone 1g IV every 24 hours  Height: 5\' 11"  (180.3 cm) Weight: 138 lb 8 oz (62.8 kg) IBW/kg (Calculated) : 75.3  Temp (24hrs), Avg:98 F (36.7 C), Min:97.4 F (36.3 C), Max:98.6 F (37 C)  Recent Labs  Lab 11/01/17 0946 11/02/17 0714  WBC 14.8* 19.3*  CREATININE 1.44* 1.47*    Estimated Creatinine Clearance: 32.6 mL/min (A) (by C-G formula based on SCr of 1.47 mg/dL (H)).    No Known Allergies  Antimicrobials this admission: 3/15 ceftriaxone >>  3/15 azithromycin >>   Dose adjustments this admission:  Microbiology results: 3/15 BCx: pending  Thank you for allowing pharmacy to be a part of this patient's care.  Gardner CandleSheema M Saharsh Sterling, PharmD, BCPS Clinical Pharmacist 11/02/2017 10:27 AM

## 2017-11-03 ENCOUNTER — Inpatient Hospital Stay: Payer: Medicare PPO

## 2017-11-03 LAB — CBC
HCT: 20.5 % — ABNORMAL LOW (ref 40.0–52.0)
HCT: 28.3 % — ABNORMAL LOW (ref 40.0–52.0)
HEMOGLOBIN: 6.6 g/dL — AB (ref 13.0–18.0)
Hemoglobin: 9.3 g/dL — ABNORMAL LOW (ref 13.0–18.0)
MCH: 29 pg (ref 26.0–34.0)
MCH: 29 pg (ref 26.0–34.0)
MCHC: 32.4 g/dL (ref 32.0–36.0)
MCHC: 32.7 g/dL (ref 32.0–36.0)
MCV: 89.3 fL (ref 80.0–100.0)
MCV: 88.6 fL (ref 80.0–100.0)
PLATELETS: 197 10*3/uL (ref 150–440)
Platelets: 186 10*3/uL (ref 150–440)
RBC: 2.29 MIL/uL — ABNORMAL LOW (ref 4.40–5.90)
RBC: 3.2 MIL/uL — ABNORMAL LOW (ref 4.40–5.90)
RDW: 15.2 % — ABNORMAL HIGH (ref 11.5–14.5)
RDW: 15.5 % — ABNORMAL HIGH (ref 11.5–14.5)
WBC: 15.7 10*3/uL — AB (ref 3.8–10.6)
WBC: 16.8 10*3/uL — AB (ref 3.8–10.6)

## 2017-11-03 LAB — BASIC METABOLIC PANEL
ANION GAP: 8 (ref 5–15)
BUN: 39 mg/dL — ABNORMAL HIGH (ref 6–20)
CHLORIDE: 99 mmol/L — AB (ref 101–111)
CO2: 26 mmol/L (ref 22–32)
Calcium: 8.5 mg/dL — ABNORMAL LOW (ref 8.9–10.3)
Creatinine, Ser: 1.18 mg/dL (ref 0.61–1.24)
GFR calc Af Amer: >60 mL/min (ref >60)
GFR, EST NON AFRICAN AMERICAN: 54 mL/min — AB (ref >60)
GLUCOSE: 127 mg/dL — AB (ref 65–99)
POTASSIUM: 4.9 mmol/L (ref 3.5–5.1)
SODIUM: 133 mmol/L — AB (ref 135–145)

## 2017-11-03 LAB — HEMOGLOBIN: HEMOGLOBIN: 9.3 g/dL — AB (ref 13.0–18.0)

## 2017-11-03 MED ORDER — IPRATROPIUM-ALBUTEROL 0.5-2.5 (3) MG/3ML IN SOLN
3.0000 mL | RESPIRATORY_TRACT | Status: DC
  Administered 2017-11-03 (×2): 3 mL via RESPIRATORY_TRACT
  Filled 2017-11-03 (×2): qty 3

## 2017-11-03 MED ORDER — CHLORHEXIDINE GLUCONATE 0.12 % MT SOLN
15.0000 mL | Freq: Two times a day (BID) | OROMUCOSAL | Status: DC
  Administered 2017-11-03 – 2017-11-06 (×6): 15 mL via OROMUCOSAL
  Filled 2017-11-03 (×6): qty 15

## 2017-11-03 MED ORDER — ORAL CARE MOUTH RINSE
15.0000 mL | Freq: Two times a day (BID) | OROMUCOSAL | Status: DC
  Administered 2017-11-03 – 2017-11-05 (×2): 15 mL via OROMUCOSAL

## 2017-11-03 MED ORDER — IPRATROPIUM-ALBUTEROL 0.5-2.5 (3) MG/3ML IN SOLN
3.0000 mL | Freq: Four times a day (QID) | RESPIRATORY_TRACT | Status: DC
  Administered 2017-11-03 – 2017-11-06 (×10): 3 mL via RESPIRATORY_TRACT
  Filled 2017-11-03 (×10): qty 3

## 2017-11-03 MED ORDER — FUROSEMIDE 10 MG/ML IJ SOLN
20.0000 mg | Freq: Once | INTRAMUSCULAR | Status: AC
  Administered 2017-11-03: 20 mg via INTRAVENOUS
  Filled 2017-11-03: qty 2

## 2017-11-03 MED ORDER — SODIUM CHLORIDE 0.9 % IV SOLN
Freq: Once | INTRAVENOUS | Status: AC
  Administered 2017-11-03: 12:00:00 via INTRAVENOUS

## 2017-11-03 MED ORDER — METHYLPREDNISOLONE SODIUM SUCC 40 MG IJ SOLR
40.0000 mg | Freq: Four times a day (QID) | INTRAMUSCULAR | Status: DC
  Administered 2017-11-03 – 2017-11-06 (×12): 40 mg via INTRAVENOUS
  Filled 2017-11-03 (×12): qty 1

## 2017-11-03 NOTE — Progress Notes (Signed)
Pt s/p 1 unit of PRBC, Dr. Juliene PinaMody ordered 2 units, per Blood bank their protocol is if Hg above 6,  patient needs to be reassessed by physician to see if pt needs a second unit of blood. Hg ordered. Dr. Juliene PinaMody made aware of protocol. Per Dr. Juliene PinaMody wants patient to received a second unit of PRBC. Will continue to monitor.

## 2017-11-03 NOTE — Progress Notes (Signed)
PT Cancellation Note  Patient Details Name: Martin Bean MRN: [id number] DOB: 07-29-1930   Cancelled Treatment:    Reason Eval/Treat Not Completed: Medical issues which prohibited therapy(=Chart reviewed, RN consulted. Pt is s/p 1u PRBC, awaiting CBC, and RN reports he is likely to need a second unit. Will hold PT eval until medically appriopriate. )  3:34 PM, 11/03/17 Rosamaria Lints, PT, DPT Physical Therapist - [facility]  [phone] (ASCOM)     Aalyssa Elderkin C 11/03/2017, 3:34 PM

## 2017-11-03 NOTE — Progress Notes (Signed)
[facility] - [place] [facility]   PATIENT NAME: Martin Bean    MR#:  [id number]  DATE OF BIRTH:  June 26, 1930  SUBJECTIVE:   Patient had some SOB this am CXR today stable but hgb dropped Denies CP or lightheadness  REVIEW OF SYSTEMS:    Review of Systems  Constitutional: Negative for fever, chills weight loss HENT: Negative for ear pain, nosebleeds, congestion, facial swelling, rhinorrhea, neck pain, neck stiffness and ear discharge.   Respiratory: Negative for cough, some SOB this am, NOwheezing  Cardiovascular: Negative for chest pain, palpitations and leg swelling.  Gastrointestinal: Negative for heartburn, abdominal pain, vomiting, diarrhea or consitpation Genitourinary: Negative for dysuria, urgency, frequency, hematuria Musculoskeletal: Negative for back pain or joint pain Neurological: Negative for dizziness, seizures, syncope, focal weakness,  numbness and headaches.  Hematological: Does not bruise/bleed easily.  Psychiatric/Behavioral: Negative for hallucinations, confusion, dysphoric mood    Tolerating Diet: yes      DRUG ALLERGIES:  No Known Allergies  VITALS:  Blood pressure (!) 141/57, pulse 63, temperature (!) 97.4 F (36.3 C), temperature source Oral, resp. rate 18, height 5\' 11"  (1.803 m), weight 65.5 kg (144 lb 8 oz), SpO2 94 %.  PHYSICAL EXAMINATION:  Constitutional: Appears well-developed and well-nourished. No distress. HENT: Normocephalic. Marland Kitchen Oropharynx is clear and moist.  Eyes: Conjunctivae and EOM are normal. PERRLA, no scleral icterus.  Neck: Normal ROM. Neck supple. No JVD. No tracheal deviation. CVS: RRR, S1/S2 +, no murmurs, no gallops, no carotid bruit.  Pulmonary:  Normal respiratory effort with rhonchii right side  Abdominal: Soft. BS +,  no distension, tenderness, rebound or guarding.  Musculoskeletal: Normal range of motion. No edema and no tenderness.  Neuro: Alert. CN 2-12 grossly intact. No focal  deficits. Skin: Skin is warm and dry. No rash noted. Psychiatric: Normal mood and affect.      LABORATORY PANEL:   CBC Recent Labs  Lab 11/03/17 0554  WBC 15.7*  HGB 6.6*  HCT 20.5*  PLT 186   ------------------------------------------------------------------------------------------------------------------  Chemistries  Recent Labs  Lab 11/03/17 0554  NA 133*  K 4.9  CL 99*  CO2 26  GLUCOSE 127*  BUN 39*  CREATININE 1.18  CALCIUM 8.5*   ------------------------------------------------------------------------------------------------------------------  Cardiac Enzymes Recent Labs  Lab 11/01/17 0946  TROPONINI <0.03   ------------------------------------------------------------------------------------------------------------------  RADIOLOGY:  Dg Chest 2 View  Result Date: 11/02/2017 CLINICAL DATA:  Known right rib fracture and hemothorax. EXAM: CHEST - 2 VIEW COMPARISON:  11/01/2017 FINDINGS: Stable less than 5% right apical pneumothorax. Stable opacity at the right base likely combination of pleural fluid and airspace consolidation. Tiny left pleural effusion. No left pneumothorax. Borderline cardiomegaly. IMPRESSION: Stable less than 5% right apical pneumothorax Stable right basilar opacity. Electronically Signed   By: Jolaine Click M.D.   On: 11/02/2017 09:16   Dg Chest 2 View  Result Date: 11/01/2017 CLINICAL DATA:  84 year old male status post fall yesterday. Right lower rib fractures and small hydropneumothorax demonstrated on chest radiographs earlier today. EXAM: CHEST - 2 VIEW COMPARISON:  1006 hours today, and earlier. FINDINGS: Comminuted and mildly displaced right lower rib fractures re-demonstrated. A small right pneumothorax persists at the lung apex. There is a moderate size right pleural effusion. These appear stable from earlier today. Stable cardiac size and mediastinal contours. Stable left lung. Calcified aortic atherosclerosis. Visualized tracheal  air column is within normal limits. Osteopenia. Stable visible bowel gas pattern. IMPRESSION: 1. Comminuted right lower rib fractures. A moderate right pleural effusion (  likely Hemothorax) and small superimposed right apical pneumothorax appears stable since 1006 hours today. 2. No new cardiopulmonary abnormality identified. Electronically Signed   By: Odessa Fleming M.D.   On: 11/01/2017 15:32   Ct Chest Wo Contrast  Result Date: 11/02/2017 CLINICAL DATA:  Fall yesterday EXAM: CT CHEST WITHOUT CONTRAST TECHNIQUE: Multidetector CT imaging of the chest was performed following the standard protocol without IV contrast. COMPARISON:  None. FINDINGS: Cardiovascular: Prominent atherosclerotic calcifications in the aortic arch and great vessels. There is no evidence of intramural hematoma or aortic aneurysm. Moderate LAD territory coronary artery calcification. Mediastinum/Nodes: No pericardial effusion. No abnormal mediastinal adenopathy. No evidence of mediastinal hemorrhage. Thyroid is within normal limits. Esophagus is unremarkable. A tiny amount of gas adjacent to the right side of the trachea is noted. Medial and apical right pneumothorax is favored over pneumomediastinum. There is otherwise no evidence of pneumomediastinum. Lungs/Pleura: A moderate right pleural effusion is present with mixed low-density and high-density areas. This is consistent with an involving hemothorax. There is associated compressive atelectasis in the posterior right lung. A 5% anterior right pneumothorax is present. The right lower lobe lobar bronchial airway becomes occluded. See image 86 of series 3 there is mild thickening of the central and secondary airways towards the right lung base. There is similar thickening of the central airways in the left lung primarily affecting the lower lobe and lingula. Mild patchy density at the posterior left lung base. No definite left pleural effusion. No left pneumothorax. Tiny blebs at the left apex.  Upper Abdomen: No acute abnormality. Musculoskeletal: Multiple posterior, lateral and lower right rib fractures are present. There are minimally displaced. There is emphysema within the chest wall soft tissues related to the rib fractures. No obvious left-sided rib fractures. Minimal T12 wedge compression deformity has a chronic appearance. No definite acute vertebral fracture. No sternal fracture. IMPRESSION: Multiple right rib fractures is associated with a 5% right pneumothorax and a moderate-sized right hemothorax. Right lower lobe bronchial airways occluded and may contain blood or debris. Minimal patchy airspace disease at the posterior left lung base. Aortic Atherosclerosis (ICD10-I70.0). Electronically Signed   By: Jolaine Click M.D.   On: 11/02/2017 09:26   Dg Chest Port 1 View  Result Date: 11/03/2017 CLINICAL DATA:  Patient with known small right apical pneumothorax. EXAM: PORTABLE CHEST 1 VIEW COMPARISON:  Chest CT 11/02/2017; chest radiograph 11/02/2017. FINDINGS: Monitoring leads overlie the patient. Stable cardiomegaly. Aortic atherosclerosis. Persistent moderate pleural effusion with underlying opacities. Tiny right apical pneumothorax, similar to slightly decreased when compared to prior. Thoracic spine degenerative changes. IMPRESSION: Stable to slight interval decrease in size of tiny right apical pneumothorax. Moderate right pleural effusion and underlying opacities. Electronically Signed   By: Annia Belt M.D.   On: 11/03/2017 10:42     ASSESSMENT AND PLAN:    84 year old male with history of essential hypertension and BPH who presented after a fall with shortness of breath.   1.  Acute hypoxic respiratory failure in the setting of moderate sized right hemothorax with atelectasis and small apical hydropneumothorax: Plan for interventional radiology to perform ultrasound-guided drainage of right pleural space Needs cardiothoracic surgery consult due to low hemoglobin Continue  incentive spirometer Continue O2 Appreciate pulmonary consultation Continue oxygen for pneumothorax Repeat chest x-ray and CBC this evening  2.  Acute blood loss anemia due to hemothorax Patient consented for blood transfusion Transfuse 2 units  3.  Pneumonia from atelectasis: Continue Rocephin and azithromycin  4.  Multiple right  rib fractures: Continue pain management Physical therapy consultation  5.  Hyponatremia in the setting of pneumonia and hemothorax HCTZ discontinued Sodium improved  6.  Essential hypertension: Due to low sodium I have discontinued HCTZ Follow blood pressure  7  BPH: Continue tamsulosin  8.  Acute kidney injury in the setting of rib fractures and dehydration Improved with IV fluids  9.  Leukocytosis: This may be related to steroids that were given for underlying pneumonia Continue Rocephin and azithromycin for pneumonia  D/w dr Lonn Georgiaconforti Paged dr Thelma Bargeoaks but he is not on call today   Management plans discussed with the patient and family and they are in agreement.  CODE STATUS: FULL  TOTAL TIME TAKING CARE OF THIS PATIENT: 29  minutes.     POSSIBLE D/C 2 days, DEPENDING ON CLINICAL CONDITION.   Andrea Ferrer M.D on 11/03/2017 at 11:11 AM  Between 7am to 6pm - Pager - [phone] After 6pm go to [email or web address] - password [email or web address]  [facility]  Office  [phone]  CC: Primary care physician; [name], [name], MD  Note: This dictation was prepared with Dragon dictation along with smaller phrase technology. Any transcriptional errors that result from this process are unintentional.

## 2017-11-03 NOTE — Progress Notes (Signed)
Pt hemoglobin came back to 9.3 after receiving one unit of PRBC. Dr. Juliene PinaMody notified. Per Dr. Juliene PinaMody repeat labs at 1900 and will decide if patient needs a second unit. Will continue to monitor.

## 2017-11-03 NOTE — Progress Notes (Signed)
Pulmonary/critical care attending  Mr. Bess KindsKimrey states he is doing okay. He had some right costal pain last night and also has complained of some expiratory rattling. He denies any hemoptysis, has had no difficulty with hemodynamics. His hemoglobin this morning did note a decrease to 6.6. Repeat stat portable chest x-ray did not show any progression of pleural effusion and patient is to be transfused. Consult to cardiothoracic surgery has been put in. Will repeat CBC and chest x-ray later on this evening to make sure no progression Will add Solu-Medrol and bronchodilators.  Tora KindredJohn Kadence Mikkelson, D.O.

## 2017-11-03 NOTE — Progress Notes (Signed)
Pulmonary/critical care addendum  Follow-up hemoglobin was 9.3 and follow-up chest x-ray does not reveal any progression of effusion.  Tora KindredJohn Cadence Minton, D.O.

## 2017-11-04 ENCOUNTER — Inpatient Hospital Stay: Payer: Medicare PPO

## 2017-11-04 DIAGNOSIS — J942 Hemothorax: Secondary | ICD-10-CM

## 2017-11-04 LAB — GLUCOSE, PLEURAL OR PERITONEAL FLUID: Glucose, Fluid: 140 mg/dL

## 2017-11-04 LAB — TYPE AND SCREEN
ABO/RH(D): O POS
ANTIBODY SCREEN: NEGATIVE
Unit division: 0

## 2017-11-04 LAB — CBC
HCT: 25.4 % — ABNORMAL LOW (ref 40.0–52.0)
Hemoglobin: 9 g/dL — ABNORMAL LOW (ref 13.0–18.0)
MCH: 30.8 pg (ref 26.0–34.0)
MCHC: 35.6 g/dL (ref 32.0–36.0)
MCV: 86.6 fL (ref 80.0–100.0)
PLATELETS: 198 10*3/uL (ref 150–440)
RBC: 2.93 MIL/uL — ABNORMAL LOW (ref 4.40–5.90)
RDW: 14.9 % — ABNORMAL HIGH (ref 11.5–14.5)
WBC: 13.6 10*3/uL — ABNORMAL HIGH (ref 3.8–10.6)

## 2017-11-04 LAB — BODY FLUID CELL COUNT WITH DIFFERENTIAL
EOS FL: 0 %
Lymphs, Fluid: 0 %
MONOCYTE-MACROPHAGE-SEROUS FLUID: 0 %
NEUTROPHIL FLUID: 100 %
OTHER CELLS FL: 0 %
WBC FLUID: 2829 uL

## 2017-11-04 LAB — BASIC METABOLIC PANEL
Anion gap: 8 (ref 5–15)
BUN: 34 mg/dL — AB (ref 6–20)
CO2: 27 mmol/L (ref 22–32)
CREATININE: 1.01 mg/dL (ref 0.61–1.24)
Calcium: 8.8 mg/dL — ABNORMAL LOW (ref 8.9–10.3)
Chloride: 98 mmol/L — ABNORMAL LOW (ref 101–111)
GFR calc Af Amer: >60 mL/min (ref >60)
GLUCOSE: 196 mg/dL — AB (ref 65–99)
POTASSIUM: 4.5 mmol/L (ref 3.5–5.1)
SODIUM: 133 mmol/L — AB (ref 135–145)

## 2017-11-04 LAB — PROTEIN, PLEURAL OR PERITONEAL FLUID: TOTAL PROTEIN, FLUID: 4.5 g/dL

## 2017-11-04 LAB — BPAM RBC
BLOOD PRODUCT EXPIRATION DATE: 201903202359
ISSUE DATE / TIME: 201903171147
UNIT TYPE AND RH: 9500

## 2017-11-04 MED ORDER — AZITHROMYCIN 250 MG PO TABS
250.0000 mg | ORAL_TABLET | Freq: Every day | ORAL | Status: AC
  Administered 2017-11-05: 250 mg via ORAL
  Filled 2017-11-04: qty 1

## 2017-11-04 NOTE — Progress Notes (Signed)
[employer] [facility] - [place] at [facility]   PATIENT NAME: Martin Bean    MR#:  [id number]  DATE OF BIRTH:  10/17/1929  SUBJECTIVE:   Patient doing well this morning. No shortness of breath this morning. REVIEW OF SYSTEMS:    Review of Systems  Constitutional: Negative for fever, chills weight loss HENT: Negative for ear pain, nosebleeds, congestion, facial swelling, rhinorrhea, neck pain, neck stiffness and ear discharge.   Respiratory: Negative for cough, no shortness of breath  NO wheezing  Cardiovascular: Negative for chest pain, palpitations and leg swelling.  Gastrointestinal: Negative for heartburn, abdominal pain, vomiting, diarrhea or consitpation Genitourinary: Negative for dysuria, urgency, frequency, hematuria Musculoskeletal: Negative for back pain or joint pain Neurological: Negative for dizziness, seizures, syncope, focal weakness,  numbness and headaches.  Hematological: Does not bruise/bleed easily.  Psychiatric/Behavioral: Negative for hallucinations, confusion, dysphoric mood    Tolerating Diet: yes      DRUG ALLERGIES:  No Known Allergies  VITALS:  Blood pressure (!) 189/83, pulse 77, temperature (!) 97.5 F (36.4 C), temperature source Oral, resp. rate (!) 26, height 5\' 11"  (1.803 m), weight 65.9 kg (145 lb 4.8 oz), SpO2 97 %.  PHYSICAL EXAMINATION:  Constitutional: Appears well-developed and well-nourished. No distress. HENT: Normocephalic. Marland Kitchen. Oropharynx is clear and moist.  Eyes: Conjunctivae and EOM are normal. PERRLA, no scleral icterus.  Neck: Normal ROM. Neck supple. No JVD. No tracheal deviation. CVS: RRR, S1/S2 +, no murmurs, no gallops, no carotid bruit.  Pulmonary:  Normal respiratory effort with rhonchii right side  Abdominal: Soft. BS +,  no distension, tenderness, rebound or guarding.  Musculoskeletal: Normal range of motion. No edema and no tenderness.  Neuro: Alert. CN 2-12 grossly intact. No focal deficits. Skin: Skin  is warm and dry. No rash noted. Psychiatric: Normal mood and affect.      LABORATORY PANEL:   CBC Recent Labs  Lab 11/04/17 0443  WBC 13.6*  HGB 9.0*  HCT 25.4*  PLT 198   ------------------------------------------------------------------------------------------------------------------  Chemistries  Recent Labs  Lab 11/04/17 0443  NA 133*  K 4.5  CL 98*  CO2 27  GLUCOSE 196*  BUN 34*  CREATININE 1.01  CALCIUM 8.8*   ------------------------------------------------------------------------------------------------------------------  Cardiac Enzymes Recent Labs  Lab 11/01/17 0946  TROPONINI <0.03   ------------------------------------------------------------------------------------------------------------------  RADIOLOGY:  Dg Chest 1 View  Result Date: 11/04/2017 CLINICAL DATA:  Hemothorax EXAM: CHEST  1 VIEW COMPARISON:  11/03/2017 FINDINGS: Moderate to large right effusion unchanged in appearance. Stable cardiopericardial silhouette with moderate aortic atherosclerosis at the arch. Stable appearance of the bony thorax without new osseous abnormality. IMPRESSION: Moderate to large right effusion with probable compressive and passive atelectasis at the right lung base. Findings are stable in appearance and unchanged. Electronically Signed   By: Tollie Ethavid  Kwon M.D.   On: 11/04/2017 03:05   Dg Chest 1 View  Result Date: 11/03/2017 CLINICAL DATA:  84 year old male with history of hemothorax. EXAM: CHEST  1 VIEW COMPARISON:  Chest x-ray 11/03/2017. FINDINGS: Moderate to large right pleural effusion. Areas of probable passive atelectasis at the base of the right lung. Left lung is well aerated. No evidence of pulmonary edema. Heart size is normal. Upper mediastinal contours are within normal limits. Aortic atherosclerosis. IMPRESSION: 1. Moderate to large right pleural effusion and probable areas of passive atelectasis in the base of the right lung, unchanged compared to the  earlier study. 2. Aortic atherosclerosis. Electronically Signed   By: Trudie Reedaniel  Entrikin M.D.   On:  11/03/2017 21:54   Dg Chest Port 1 View  Result Date: 11/03/2017 CLINICAL DATA:  Patient with known small right apical pneumothorax. EXAM: PORTABLE CHEST 1 VIEW COMPARISON:  Chest CT 11/02/2017; chest radiograph 11/02/2017. FINDINGS: Monitoring leads overlie the patient. Stable cardiomegaly. Aortic atherosclerosis. Persistent moderate pleural effusion with underlying opacities. Tiny right apical pneumothorax, similar to slightly decreased when compared to prior. Thoracic spine degenerative changes. IMPRESSION: Stable to slight interval decrease in size of tiny right apical pneumothorax. Moderate right pleural effusion and underlying opacities. Electronically Signed   By: Annia Belt M.D.   On: 11/03/2017 10:42     ASSESSMENT AND PLAN:    84 year old male with history of essential hypertension and BPH who presented after a fall with shortness of breath.   1.  Acute hypoxic respiratory failure in the setting of moderate sized right hemothorax with atelectasis and small apical hydropneumothorax: Plan for interventional radiology to perform ultrasound-guided drainage of right pleural space today Discussed with Dr Thelma Barge, plan for TPA later this week after IR drainage.  Continue incentive spirometer Appreciate pulmonary consultation Continue oxygen for pneumothorax Repeat chest x-ray shows stable effusion   2.  Acute blood loss anemia due to hemothorax Patient received 1 unit PRBC  3.  Pneumonia from atelectasis: Continue Rocephin and azithromycin D3/5  4.  Multiple right rib fractures: Continue pain management Physical therapy consultationrequested  5.  Hyponatremia in the setting of pneumonia and hemothorax HCTZ has beendiscontinued Sodium has been stable.   6.  Essential hypertension: Due to low sodium I have discontinued HCTZ Follow blood pressure  7  BPH: Continue tamsulosin  8.   Acute kidney injury in the setting of rib fractures and dehydration Improved with IV fluids  9.  Leukocytosis: This may be related to steroids that were given for underlying pneumonia Continue Rocephin and azithromycin for pneumonia  Discussed with Dr.Oaks.  Management plans discussed with the patient and family and they are in agreement.  CODE STATUS: FULL  TOTAL TIME TAKING CARE OF THIS PATIENT: 25  minutes.     POSSIBLE D/C 2 days, DEPENDING ON CLINICAL CONDITION.   Aida Lemaire M.D on 11/04/2017 at 9:29 AM  Between 7am to 6pm - Pager - [phone] After 6pm go to [email or web address] - password [employer]  [facility] [place] [facility]  Office  [phone]  CC: Primary care physician; Gracelyn Nurse, MD  Note: This dictation was prepared with Dragon dictation along with smaller phrase technology. Any transcriptional errors that result from this process are unintentional.

## 2017-11-04 NOTE — Progress Notes (Signed)
Physical Therapy Evaluation Patient Details Name: Martin Bean MRN: [id number] DOB: 11-03-1929 Today's Date: 11/04/2017   History of Present Illness  Martin Bean  is a 84 y.o. male with a known history of hypertension, prostate hypertrophy, psoriasis presented to the emergency room for fall.  Patient yesterday evening at home lost balance and fell down on the right side.  No history of any head injury.  Has pain in the right-sided rib cage.  No loss of consciousness, and any witnessed seizures patient also had some difficulty breathing was put on oxygen via nasal cannula after arrival in the emergency room.Marland Kitchen Was worked up with chest x-ray which revealed fractures of the ribs on the right side along with small hydropneumothorax less than 5%.  Oxygen saturation is stable greater than 93%.  No fever and chills.  He was worked up with CT head which showed no acute abnormality. Pt also with hemopneumothorax and is now s/p thoracentesis. Cardiothoracic surgery consult complete.   Clinical Impression  Pt admitted with above diagnosis. Pt currently with functional limitations due to the deficits listed below (see PT Problem List). Pt requires assistance to come upright to sitting due to rib pain. HOB elevated and use of bed rails. Pt provided cues for safe hand placement during transfers. Once upright in standing pt is stable without UE support on rolling walker but chooses to use walker. Vital obtained during position changes and orthostatic vitals are positive for hypotension from sitting to standing. RN notified. Pt is able to ambulate slowly to door and back to bed. Pt uses rolling walker to stabilize. SaO2 remains in the low 90's on 2L/min supplemental O2 via [place]. Pt reports fatigue and states he is unable to ambulate farther at this time. At this time recommend SNF placement at discharge. Will continue to follow and update recommendations as appropriate. Pt will benefit from PT services to address deficits  in strength, balance, and mobility in order to return to full function at home.        Follow Up Recommendations SNF    Equipment Recommendations  None recommended by PT    Recommendations for Other Services       Precautions / Restrictions Precautions Precautions: Fall Restrictions Weight Bearing Restrictions: No      Mobility  Bed Mobility Overal bed mobility: Needs Assistance Bed Mobility: Supine to Sit     Supine to sit: Mod assist     General bed mobility comments: Pt requires assistance to come upright to sitting due to rib pain. HOB elevated and use of bed rails  Transfers Overall transfer level: Needs assistance Equipment used: Rolling walker (2 wheeled) Transfers: Sit to/from Stand Sit to Stand: Min guard         General transfer comment: Pt provided cues for safe hand placement. Once upright in standing pt is stable without UE support on rolling walker  Ambulation/Gait Ambulation/Gait assistance: Min guard Ambulation Distance (Feet): 25 Feet Assistive device: Rolling walker (2 wheeled)       General Gait Details: Pt is able to ambulate slowly to door and back to bed. Pt uses rolling walker to stabilize. SaO2 remains in the low 90's on 2L/min supplemental O2 via [place]. Pt reports fatigue and states he is unable to ambulate farther at this time.   Stairs            Wheelchair Mobility    Modified Rankin (Stroke Patients Only)       Balance Overall balance assessment: Needs assistance  Sitting-balance support: No upper extremity supported Sitting balance-Leahy Scale: Good     Standing balance support: No upper extremity supported Standing balance-Leahy Scale: Fair                               Pertinent Vitals/Pain Pain Assessment: 0-10 Pain Score: 3  Pain Location: Rib pain Pain Intervention(s): Monitored during session    Home Living Family/patient expects to be discharged to:: Private residence Living Arrangements:  Alone Available Help at Discharge: Family Type of Home: House Home Access: Stairs to enter Entrance Stairs-Rails: Can reach both Entrance Stairs-Number of Steps: 4 Home Layout: One level Home Equipment: Grab bars - tub/shower;Walker - 2 wheels;Shower seat;Cane - single point      Prior Function Level of Independence: Independent         Comments: Pt states that he is independent with ADLs/IADLs. He was driving up until relatively recently     Hand Dominance   Dominant Hand: Right    Extremity/Trunk Assessment   Upper Extremity Assessment Upper Extremity Assessment: Overall WFL for tasks assessed    Lower Extremity Assessment Lower Extremity Assessment: Generalized weakness       Communication   Communication: No difficulties  Cognition Arousal/Alertness: Awake/alert Behavior During Therapy: WFL for tasks assessed/performed Overall Cognitive Status: Within Functional Limits for tasks assessed                                        General Comments      Exercises     Assessment/Plan    PT Assessment Patient needs continued PT services  PT Problem List Decreased strength;Decreased activity tolerance;Decreased balance;Decreased mobility;Cardiopulmonary status limiting activity       PT Treatment Interventions DME instruction;Gait training;Stair training;Therapeutic activities;Therapeutic exercise;Balance training;Neuromuscular re-education;Patient/family education    PT Goals (Current goals can be found in the Care Plan section)  Acute Rehab PT Goals Patient Stated Goal: Return to prior function at home PT Goal Formulation: With patient/family Time For Goal Achievement: 11/18/17 Potential to Achieve Goals: Good    Frequency Min 2X/week   Barriers to discharge Decreased caregiver support Pt lives alone    Co-evaluation               AM-PAC PT "6 Clicks" Daily Activity  Outcome Measure Difficulty turning over in bed (including  adjusting bedclothes, sheets and blankets)?: Unable Difficulty moving from lying on back to sitting on the side of the bed? : Unable Difficulty sitting down on and standing up from a chair with arms (e.g., wheelchair, bedside commode, etc,.)?: A Little Help needed moving to and from a bed to chair (including a wheelchair)?: A Little Help needed walking in hospital room?: A Little Help needed climbing 3-5 steps with a railing? : A Little 6 Click Score: 14    End of Session Equipment Utilized During Treatment: Gait belt Activity Tolerance: Patient tolerated treatment well Patient left: in bed;with call bell/phone within reach;with bed alarm set;with family/visitor present Nurse Communication: Mobility status PT Visit Diagnosis: Unsteadiness on feet (R26.81);Muscle weakness (generalized) (M62.81);History of falling (Z91.81);Difficulty in walking, not elsewhere classified (R26.2)    Time: [id number] PT Time Calculation (min) (ACUTE ONLY): 48 min   Charges:   PT Evaluation $PT Eval Moderate Complexity: 1 Mod PT Treatments $Therapeutic Activity: 8-22 mins   PT G Codes:  Lynnea Maizes PT, DPT    Raylyn Speckman 11/04/2017, 8:37 PM

## 2017-11-04 NOTE — Care Management (Addendum)
Patient lives alone and up until 6 months ago, patient very active and able to drive.  There has been a what seems to be a "pretty sudden" decline in patient's functional status .  He currently does have have any services in the home.  has a fall at home which has resulted right rib fractures with right sided hydropneumothorax.  Had thoracentesis with removal of 1.2 liters.  PT consult is in progress. Family states to CM that they feel patient should go to skilled nursing facility. 02 is acute

## 2017-11-04 NOTE — Procedures (Signed)
Pre procedural Dx: Symptomatic Pleural effusion Post procedural Dx: Same  Successful US guided rigth sided thoracentesis yielding 1.2 L of bloody pleural fluid.   Samples sent to lab for analysis.  EBL: None  Complications: None immediate.  Katherina RightJay Aviendha Azbell, MD Pager #: [phone]

## 2017-11-04 NOTE — Progress Notes (Signed)
Patient ID: Martin HaverJasper L Mars, male   DOB: 03/25/1930, 84 y.o.   MRN: [id number]  Chief Complaint  Patient presents with  . Shortness of Breath  . Fall    Referred By Dr. Maxcine HamSital Moody Reason for Referral right-sided hemothorax with rib fractures  HPI Location, Quality, Duration, Severity, Timing, Context, Modifying Factors, Associated Signs and Symptoms.  Martin Bean is a 84 y.o. male.  He has had several episodes of dizziness over the last several months and recently had an episode where he became dizzy but could not sit down in time and fell against a chair.  After he fell he was brought to the emergency room where chest x-ray was done showing a large right-sided pleural effusion.  He then underwent a CT scan showing multiple rib fractures on the lower aspect of his right hemithorax.  There was a heterogeneous right pleural effusion and small right pneumothorax.  He states that he just recently underwent a ultrasound-guided thoracentesis and removed 1.2 L of fluid.  He thinks that he feels better after that has been completed.  A chest x-ray is currently pending.  He was conscious throughout his entire event.  He did not lose consciousness and there was no head injury at the time.  This is the first episode.   Past Medical History:  Diagnosis Date  . Diastolic heart failure (HCC)   . Family history of adverse reaction to anesthesia   . HTN (hypertension)   . Pneumonia   . Prostate enlargement   . Psoriasis     Past Surgical History:  Procedure Laterality Date  . ENDARTERECTOMY Left 10/02/2017   Procedure: ENDARTERECTOMY CAROTID;  Surgeon: Annice Needyew, Jason S, MD;  Location: [facility] ORS;  Service: Vascular;  Laterality: Left;  . HERNIA REPAIR      Family History  Problem Relation Age of Onset  . Stroke Mother     Social History Social History   Tobacco Use  . Smoking status: Former Smoker    Types: Cigarettes    Last attempt to quit: 09/24/2013    Years since quitting: 4.1  .  Smokeless tobacco: Never Used  Substance Use Topics  . Alcohol use: No    Frequency: Never  . Drug use: No    No Known Allergies  Current Facility-Administered Medications  Medication Dose Route Frequency Provider Last Rate Last Dose  . 0.9 %  sodium chloride infusion  250 mL Intravenous PRN Pyreddy, Vivien RotaPavan, MD      . acetaminophen (TYLENOL) tablet 650 mg  650 mg Oral Q6H PRN Pyreddy, Vivien RotaPavan, MD       Or  . acetaminophen (TYLENOL) suppository 650 mg  650 mg Rectal Q6H PRN Pyreddy, Pavan, MD      . Apremilast TABS 30 mg  30 mg Oral Q supper Pyreddy, Pavan, MD      . aspirin EC tablet 81 mg  81 mg Oral Daily Pyreddy, Vivien RotaPavan, MD   81 mg at 11/03/17 1032  . atorvastatin (LIPITOR) tablet 40 mg  40 mg Oral q1800 Ihor AustinPyreddy, Pavan, MD   40 mg at 11/03/17 1645  . azithromycin (ZITHROMAX) 500 mg in sodium chloride 0.9 % 250 mL IVPB  500 mg Intravenous Q24H Adrian SaranMody, Sital, MD   Stopped at 11/03/17 1210  . cefTRIAXone (ROCEPHIN) 1 g in sodium chloride 0.9 % 100 mL IVPB  1 g Intravenous Q24H Gardner CandleHallaji, Sheema M, RPH   Stopped at 11/03/17 1148  . chlorhexidine (PERIDEX) 0.12 % solution 15 mL  15  mL Mouth Rinse BID Adrian Saran, MD   15 mL at 11/03/17 2205  . feeding supplement (ENSURE ENLIVE) (ENSURE ENLIVE) liquid 237 mL  237 mL Oral BID BM Pyreddy, Pavan, MD   237 mL at 11/03/17 1453  . hydrocerin (EUCERIN) cream 1 application  1 application Topical TID PRN Ihor Austin, MD      . HYDROcodone-acetaminophen (NORCO/VICODIN) 5-325 MG per tablet 1-2 tablet  1-2 tablet Oral Q4H PRN Ihor Austin, MD   2 tablet at 11/03/17 1453  . ipratropium-albuterol (DUONEB) 0.5-2.5 (3) MG/3ML nebulizer solution 3 mL  3 mL Nebulization Q6H PRN Conforti, John, DO      . ipratropium-albuterol (DUONEB) 0.5-2.5 (3) MG/3ML nebulizer solution 3 mL  3 mL Nebulization Q6H Mody, Sital, MD   3 mL at 11/04/17 0811  . MEDLINE mouth rinse  15 mL Mouth Rinse q12n4p Mody, Sital, MD   15 mL at 11/03/17 1325  . methylPREDNISolone sodium  succinate (SOLU-MEDROL) 40 mg/mL injection 40 mg  40 mg Intravenous Q6H Conforti, John, DO   40 mg at 11/04/17 0530  . multivitamin with minerals tablet 1 tablet  1 tablet Oral Daily Ihor Austin, MD   1 tablet at 11/03/17 1032  . ondansetron (ZOFRAN) tablet 4 mg  4 mg Oral Q6H PRN Pyreddy, Vivien Rota, MD       Or  . ondansetron (ZOFRAN) injection 4 mg  4 mg Intravenous Q6H PRN Pyreddy, Pavan, MD      . senna-docusate (Senokot-S) tablet 1 tablet  1 tablet Oral QHS PRN Pyreddy, Vivien Rota, MD      . sertraline (ZOLOFT) tablet 50 mg  50 mg Oral QHS Ihor Austin, MD   50 mg at 11/03/17 2205  . sodium chloride flush (NS) 0.9 % injection 3 mL  3 mL Intravenous Q12H Ihor Austin, MD   3 mL at 11/03/17 2207  . sodium chloride flush (NS) 0.9 % injection 3 mL  3 mL Intravenous PRN Pyreddy, Vivien Rota, MD      . tamsulosin (FLOMAX) capsule 0.4 mg  0.4 mg Oral Q1400 Pyreddy, Vivien Rota, MD   0.4 mg at 11/03/17 1453  . triamcinolone cream (KENALOG) 0.1 % 1 application  1 application Topical BID PRN Pyreddy, Vivien Rota, MD          Review of Systems A complete review of systems was asked and was negative except for the following positive findings shortness of breath which has improved following thoracentesis  Blood pressure (!) 159/68, pulse 79, temperature (!) 97.5 F (36.4 C), temperature source Oral, resp. rate (!) 26, height 5\' 11"  (1.803 m), weight 145 lb 4.8 oz (65.9 kg), SpO2 93 %.  Physical Exam CONSTITUTIONAL:  Pleasant, well-developed, well-nourished, and in no acute distress. EYES: Pupils equal and reactive to light, Sclera non-icteric EARS, NOSE, MOUTH AND THROAT:  The oropharynx was clear.  Dentition is good repair.  Oral mucosa pink and moist. LYMPH NODES:  Lymph nodes in the neck and axillae were normal RESPIRATORY:  Lungs were clear.  Normal respiratory effort without pathologic use of accessory muscles of respiration CARDIOVASCULAR: Heart was regular without murmurs.  There were no carotid bruits. GI:  The abdomen was soft, nontender, and nondistended. There were no palpable masses. There was no hepatosplenomegaly. There were normal bowel sounds in all quadrants. GU:  Rectal deferred.   MUSCULOSKELETAL:  Normal muscle strength and tone.  No clubbing or cyanosis.   SKIN:  There were no pathologic skin lesions.  There were no nodules on palpation.  There  was some subcutaneous emphysema over the right lateral chest wall NEUROLOGIC:  Sensation is normal.  Cranial nerves are grossly intact. PSYCH:  Oriented to person, place and time.  Mood and affect are normal.  Data Reviewed CT scan and chest x-rays  I have personally reviewed the patient's imaging, laboratory findings and medical records.    Assessment    Multiple right-sided rib fractures with pleural effusion (hemothorax    Plan    I agree with the current plan to perform a thoracentesis which is just been completed.  We will check his post thoracentesis chest x-ray.  Hopefully he will not need any additional intervention.  I will continue to follow the patient with you.       Hulda Marin, MD 11/04/2017, 1:01 PM

## 2017-11-04 NOTE — Progress Notes (Signed)
PT Hold Note  Patient Details Name: Martin HaverJasper L Bean MRN: [id number] DOB: 08/17/1930   Evaluation Hold:    Reason Eval/Treat Not Completed: Medical issues which prohibited therapy. Chart reviewed and spoke with RN. Pt is scheduled for thoracentesis today but time is yet to be determined. Consult for cardiothoracic surgery still pending. Will hold PT evaluation until after thoracentesis and ideally after cardiothoracic surgery consult is complete to determine plan of care. Will continue to follow.  Sharalyn InkJason D Lennin Osmond PT, DPT   [name] 11/04/2017, 8:32 AM

## 2017-11-05 ENCOUNTER — Inpatient Hospital Stay: Payer: Medicare PPO

## 2017-11-05 LAB — CBC
HEMATOCRIT: 25 % — AB (ref 40.0–52.0)
Hemoglobin: 8.1 g/dL — ABNORMAL LOW (ref 13.0–18.0)
MCH: 28.9 pg (ref 26.0–34.0)
MCHC: 32.5 g/dL (ref 32.0–36.0)
MCV: 89.2 fL (ref 80.0–100.0)
Platelets: 223 10*3/uL (ref 150–440)
RBC: 2.81 MIL/uL — ABNORMAL LOW (ref 4.40–5.90)
RDW: 15.2 % — AB (ref 11.5–14.5)
WBC: 14.3 10*3/uL — ABNORMAL HIGH (ref 3.8–10.6)

## 2017-11-05 LAB — PREPARE RBC (CROSSMATCH)

## 2017-11-05 LAB — PH, BODY FLUID: pH, Body Fluid: 7.3

## 2017-11-05 NOTE — Progress Notes (Signed)
Physical Therapy Treatment Patient Details Name: Martin Bean MRN: [id number] DOB: 04/08/1930 Today's Date: 11/05/2017    History of Present Illness Martin Bean  is a 84 y.o. male with a known history of hypertension, prostate hypertrophy, psoriasis presented to the emergency room for fall.  Patient yesterday evening at home lost balance and fell down on the right side.  No history of any head injury.  Has pain in the right-sided rib cage.  No loss of consciousness, and any witnessed seizures patient also had some difficulty breathing was put on oxygen via nasal cannula after arrival in the emergency room.Marland Kitchen. Was worked up with chest x-ray which revealed fractures of the ribs on the right side along with small hydropneumothorax less than 5%.  Oxygen saturation is stable greater than 93%.  No fever and chills.  He was worked up with CT head which showed no acute abnormality. Pt also with hemopneumothorax and is now s/p thoracentesis. Cardiothoracic surgery consult complete.     PT Comments    Pt in bed requesting to use bathroom.  To edge of bed with supervision and rail.  He was able to stand and ambulate to bathroom then continued around nursing unit with walker and min guard.  Remained up in recliner at end of session.  Pt has made significant progress since evaluation.  Pt with no LOB or buckling but still presents with general weakness limiting gait.  Daughter in with pt and observed session.    Pt stated he has RW and cane at home.  Stated he does not use either regularly but at times feels safer with cane (homemade walking stick).  Encouraged pt to use walker upon discharge until strength improves and daughter voiced agreement and understanding.  Pt remains somewhat resistant to regular use of AD.     Follow Up Recommendations  Home health PT;Supervision for mobility/OOB;Supervision/Assistance - 24 hour     Equipment Recommendations       Recommendations for Other Services        Precautions / Restrictions Precautions Precautions: Fall Restrictions Weight Bearing Restrictions: No    Mobility  Bed Mobility Overal bed mobility: Needs Assistance Bed Mobility: Supine to Sit     Supine to sit: Min guard     General bed mobility comments: in recliner  Transfers Overall transfer level: Needs assistance Equipment used: Rolling walker (2 wheeled) Transfers: Sit to/from Stand Sit to Stand: Min guard            Ambulation/Gait   Ambulation Distance (Feet): 160 Feet Assistive device: Rolling walker (2 wheeled) Gait Pattern/deviations: Step-through pattern;Decreased step length - right;Decreased step length - left;Narrow base of support         Stairs            Wheelchair Mobility    Modified Rankin (Stroke Patients Only)       Balance Overall balance assessment: Needs assistance Sitting-balance support: No upper extremity supported Sitting balance-Leahy Scale: Good     Standing balance support: No upper extremity supported Standing balance-Leahy Scale: Fair                              Cognition Arousal/Alertness: Awake/alert Behavior During Therapy: WFL for tasks assessed/performed Overall Cognitive Status: Within Functional Limits for tasks assessed  Exercises Other Exercises Other Exercises: to bathroom to void and BM.  ind self care in standing without LOB    General Comments        Pertinent Vitals/Pain Pain Assessment: 0-10 Pain Score: 2  Pain Location:  rib pain "I feel pretty good today" Pain Descriptors / Indicators: Sore Pain Intervention(s): Limited activity within patient's tolerance;Monitored during session    Home Living                      Prior Function            PT Goals (current goals can now be found in the care plan section) Progress towards PT goals: Progressing toward goals    Frequency    Min 2X/week       PT Plan Discharge plan needs to be updated.    Co-evaluation              AM-PAC PT "6 Clicks" Daily Activity  Outcome Measure  Difficulty turning over in bed (including adjusting bedclothes, sheets and blankets)?: A Little Difficulty moving from lying on back to sitting on the side of the bed? : A Little Difficulty sitting down on and standing up from a chair with arms (e.g., wheelchair, bedside commode, etc,.)?: A Little Help needed moving to and from a bed to chair (including a wheelchair)?: A Little Help needed walking in hospital room?: A Little Help needed climbing 3-5 steps with a railing? : A Little 6 Click Score: 18    End of Session Equipment Utilized During Treatment: Gait belt Activity Tolerance: Patient tolerated treatment well Patient left: in chair;with chair alarm set;with call bell/phone within reach;with family/visitor present         Time: 1610-9604 PT Time Calculation (min) (ACUTE ONLY): 16 min  Charges:  $Gait Training: 8-22 mins                    G Codes:      Danielle Dess, PTA 11/05/17, 2:58 PM

## 2017-11-05 NOTE — Progress Notes (Signed)
[employer] [facility] [place] [facility]   PATIENT NAME: Martin Bean    MR#:  [id number]  DATE OF BIRTH:  [birth year]  SUBJECTIVE:   Patient had 1.2 L of bloody fluid removed by thoracentesis yesterday. This morning he is complaining of right-sided pain it is worse when he is laying in bed.   REVIEW OF SYSTEMS:    Review of Systems  Constitutional: Negative for fever, chills weight loss HENT: Negative for ear pain, nosebleeds, congestion, facial swelling, rhinorrhea, neck pain, neck stiffness and ear discharge.   Respiratory: Negative for cough, no shortness of breath  NO wheezing  Cardiovascular: Right-sided chest pain Gastrointestinal: Negative for heartburn, abdominal pain, vomiting, diarrhea or consitpation Genitourinary: Negative for dysuria, urgency, frequency, hematuria Musculoskeletal: Negative for back pain or joint pain Neurological: Negative for dizziness, seizures, syncope, focal weakness,  numbness and headaches.  Hematological: Does not bruise/bleed easily.  Psychiatric/Behavioral: Negative for hallucinations, confusion, dysphoric mood    Tolerating Diet: yes      DRUG ALLERGIES:  No Known Allergies  VITALS:  Blood pressure (!) 166/67, pulse 70, temperature 97.9 F (36.6 C), resp. rate 18, height 5\' 11"  (1.803 m), weight 63.4 kg (139 lb 11.2 oz), SpO2 100 %.  PHYSICAL EXAMINATION:  Constitutional: Appears well-developed and well-nourished. No distress. HENT: Normocephalic. Marland Kitchen Oropharynx is clear and moist.  Eyes: Conjunctivae and EOM are normal. PERRLA, no scleral icterus.  Neck: Normal ROM. Neck supple. No JVD. No tracheal deviation. CVS: RRR, S1/S2 +, no murmurs, no gallops, no carotid bruit.  Pulmonary:  Normal respiratory effort with mild crackles right base  Abdominal: Soft. BS +,  no distension, tenderness, rebound or guarding.  Musculoskeletal: Normal range of motion. No edema and no tenderness.  Neuro: Alert. CN 2-12 grossly intact.  No focal deficits. Skin: Skin is warm and dry. No rash noted. Psychiatric: Normal mood and affect.      LABORATORY PANEL:   CBC Recent Labs  Lab 11/05/17 0613  WBC 14.3*  HGB 8.1*  HCT 25.0*  PLT 223   ------------------------------------------------------------------------------------------------------------------  Chemistries  Recent Labs  Lab 11/04/17 0443  NA 133*  K 4.5  CL 98*  CO2 27  GLUCOSE 196*  BUN 34*  CREATININE 1.01  CALCIUM 8.8*   ------------------------------------------------------------------------------------------------------------------  Cardiac Enzymes Recent Labs  Lab 11/01/17 0946  TROPONINI <0.03   ------------------------------------------------------------------------------------------------------------------  RADIOLOGY:  Dg Chest 1 View  Result Date: 11/05/2017 CLINICAL DATA:  Follow-up right pneumothorax EXAM: CHEST  1 VIEW COMPARISON:  11/04/17 FINDINGS: Cardiac shadow is stable. Aortic calcifications are again seen. Minimal residual right pleural effusion is again noted. No pneumothorax is seen. Persistent right basilar atelectasis is seen. No acute bony abnormality is seen. Stable rib fractures are noted. IMPRESSION: Minimal residual right pleural effusion and right basilar atelectasis. No pneumothorax is noted. Right rib fractures are again noted and stable. Electronically Signed   By: Alcide Clever M.D.   On: 11/05/2017 08:40   Dg Chest 1 View  Result Date: 11/04/2017 CLINICAL DATA:  Hemothorax EXAM: CHEST  1 VIEW COMPARISON:  11/03/2017 FINDINGS: Moderate to large right effusion unchanged in appearance. Stable cardiopericardial silhouette with moderate aortic atherosclerosis at the arch. Stable appearance of the bony thorax without new osseous abnormality. IMPRESSION: Moderate to large right effusion with probable compressive and passive atelectasis at the right lung base. Findings are stable in appearance and unchanged.  Electronically Signed   By: Tollie Eth M.D.   On: 11/04/2017 03:05   Dg Chest 1  View  Result Date: 11/03/2017 CLINICAL DATA:  84 year old male with history of hemothorax. EXAM: CHEST  1 VIEW COMPARISON:  Chest x-ray 11/03/2017. FINDINGS: Moderate to large right pleural effusion. Areas of probable passive atelectasis at the base of the right lung. Left lung is well aerated. No evidence of pulmonary edema. Heart size is normal. Upper mediastinal contours are within normal limits. Aortic atherosclerosis. IMPRESSION: 1. Moderate to large right pleural effusion and probable areas of passive atelectasis in the base of the right lung, unchanged compared to the earlier study. 2. Aortic atherosclerosis. Electronically Signed   By: Trudie Reedaniel  Entrikin M.D.   On: 11/03/2017 21:54   Dg Chest Port 1 View  Result Date: 11/04/2017 CLINICAL DATA:  History of right-sided rib fractures with associated right-sided hemothorax, post right-sided thoracentesis EXAM: PORTABLE CHEST 1 VIEW COMPARISON:  Chest radiograph-earlier same day; chest CT - 11/02/2017 FINDINGS: Grossly unchanged cardiac silhouette and mediastinal contours with atherosclerotic plaque within the thoracic aorta. Interval reduction in persistent small/trace right-sided effusion post thoracentesis. No pneumothorax. Improved aeration of the right lung base with persistent bibasilar opacities, likely atelectasis. No new focal airspace opacities. No evidence of edema. A pacer lead overlies the peripheral aspect the right upper lung. Known right-sided rib fractures not well demonstrated on the present examination IMPRESSION: 1. Interval reduction in persistent small/trace of sided effusion post thoracentesis. No pneumothorax. 2. Improved aeration of right lung base with persistent right basilar opacities, likely atelectasis. Electronically Signed   By: Simonne ComeJohn  Watts M.D.   On: 11/04/2017 12:25   Dg Chest Port 1 View  Result Date: 11/03/2017 CLINICAL DATA:  Patient  with known small right apical pneumothorax. EXAM: PORTABLE CHEST 1 VIEW COMPARISON:  Chest CT 11/02/2017; chest radiograph 11/02/2017. FINDINGS: Monitoring leads overlie the patient. Stable cardiomegaly. Aortic atherosclerosis. Persistent moderate pleural effusion with underlying opacities. Tiny right apical pneumothorax, similar to slightly decreased when compared to prior. Thoracic spine degenerative changes. IMPRESSION: Stable to slight interval decrease in size of tiny right apical pneumothorax. Moderate right pleural effusion and underlying opacities. Electronically Signed   By: Annia Beltrew  Davis M.D.   On: 11/03/2017 10:42   [place] Thoracentesis Asp Pleural Space W/img Guide  Result Date: 11/04/2017 INDICATION: History of recent fall suffering several right-sided rib fractures with associated development of right-sided hemothorax. Please perform ultrasound-guided thoracentesis for diagnostic and therapeutic purposes. EXAM: US THORACENTESIS ASP PLEURAL SPACE W/IMG GUIDE COMPARISON:  Chest radiograph - earlier same day; chest CT - 11/02/2017 MEDICATIONS: None. COMPLICATIONS: None immediate. TECHNIQUE: Informed written consent was obtained from the patient after a discussion of the risks, benefits and alternatives to treatment. A timeout was performed prior to the initiation of the procedure. Initial ultrasound scanning demonstrates a moderate to large sized right-sided anechoic pleural effusion. The lower chest was prepped and draped in the usual sterile fashion. 1% lidocaine was used for local anesthesia. An ultrasound image was saved for documentation purposes. An 8 Fr Safe-T-Centesis catheter was introduced. The thoracentesis was performed. The catheter was removed and a dressing was applied. The patient tolerated the procedure well without immediate post procedural complication. The patient was escorted to have an upright chest radiograph. FINDINGS: A total of approximately 1.2 liters of bloody fluid was removed.  Requested samples were sent to the laboratory. IMPRESSION: Successful ultrasound-guided right sided thoracentesis yielding 1.2 liters of bloody pleural fluid. Electronically Signed   By: Simonne ComeJohn  Watts M.D.   On: 11/04/2017 12:26     ASSESSMENT AND PLAN:    84 year old male with  history of essential hypertension and BPH who presented after a fall with shortness of breath.   1.  Acute hypoxic respiratory failure in the setting of moderate sized right hemothorax with atelectasis and small apical hydropneumothorax: Patient underwent thoracentesis will 1.2 L removed on March 18. Chest x-ray shows improvement in pleural effusion. Today complaining of right-sided pain likely due to atelectasis I will order chest x-ray Cardiothoracic surgery consultation is much appreciated. Continue incentive spirometer   2.  Acute blood loss anemia due to hemothorax Patient as receivedreceived 1 unit PRBC Hemoglobin at 8 this morning. No indication for transfusion and will repeat CBC in a.m.  3.  Pneumonia from atelectasis: Continue Rocephin and azithromycin D /5 Continue incentive spirometer   4.  Multiple right rib fractures: Continue pain management Physical therapy consultation Is recommending skilled nursing facility upon discharge  5.  Hyponatremia in the setting of pneumonia and hemothorax HCTZ has been discontinued Sodium has been stable.   6.  Essential hypertension: Due to low sodium I have discontinued HCTZ Following blood pressure  7  BPH: Continue tamsulosin  8.  Acute kidney injury in the setting of rib fractures and dehydration Improved with IV fluids  9.  Leukocytosis: This may be related to steroids that were given for underlying pneumonia Continue Rocephin and azithromycin for pneumonia    Management plans discussed with the patient and family and they are in agreement.  CODE STATUS: FULL  TOTAL TIME TAKING CARE OF THIS PATIENT: 24  minutes.   Physical therapy is  recommended skilled nursing facility upon discharge Social worker consult placed  POSSIBLE D/C tomorrow, DEPENDING ON CLINICAL CONDITION.   Martin Bean M.D on 11/05/2017 at 8:43 AM  Between 7am to 6pm - Pager - [phone] After 6pm go to [email or web address] - password [employer]  [facility]  Office  [phone]  CC: Primary care physician; Gracelyn Nurse, MD  Note: This dictation was prepared with Dragon dictation along with smaller phrase technology. Any transcriptional errors that result from this process are unintentional.

## 2017-11-05 NOTE — Progress Notes (Signed)
He underwent a right-sided thoracentesis yesterday which he tolerated well.  Today he was able to walk in the halls.  He seems much more stable on his feet today.  He has no specific complaints.  I have independently reviewed his chest x-ray from today.  There is blunting of the right costophrenic angle but otherwise looks good.  I do not appreciate a pneumothorax on it.  I did discuss these findings with the patient's family and reviewed with them the results.  They are interested in the patient attending a rehabilitation center for ongoing therapy.  I would be happy to follow-up with him as an outpatient.

## 2017-11-05 NOTE — Plan of Care (Signed)
  Progressing  Clinical Measurements: Ability to maintain clinical measurements within normal limits will improve 11/05/2017 1832 - Progressing by Edison PaceJackson, Maryclare Nydam D, RN Respiratory complications will improve 11/05/2017 1832 - Progressing by Edison PaceJackson, Declyn Offield D, RN Activity: Risk for activity intolerance will decrease 11/05/2017 1832 - Progressing by Jerene DillingJackson, Acen Craun D, RN Pain Managment: General experience of comfort will improve 11/05/2017 1832 - Progressing by Jerene DillingJackson, Antinette Keough D, RN Safety: Ability to remain free from injury will improve 11/05/2017 1832 - Progressing by Jerene DillingJackson, Imajean Mcdermid D, RN

## 2017-11-05 NOTE — Progress Notes (Signed)
Nutrition Follow Up Note   DOCUMENTATION CODES:   Non-severe (moderate) malnutrition in context of chronic illness  INTERVENTION:   Ensure Enlive po BID, each supplement provides 350 kcal and 20 grams of protein  MVI daily  Magic cup TID with meals, each supplement provides 290 kcal and 9 grams of protein  Dysphagia 3 diet   NUTRITION DIAGNOSIS:   Moderate Malnutrition related to other (see comment)(chronic poor appetite, advanced age) as evidenced by moderate fat depletion, moderate muscle depletion.  GOAL:   Patient will meet greater than or equal to 90% of their needs  -progressing  MONITOR:   PO intake, Supplement acceptance, Labs, Weight trends, I & O's, Skin  ASSESSMENT:   84 y.o. male with a known history of hypertension, prostate hypertrophy, psoriasis presented to the emergency room for fall. Pt found to have rib fractures and hydropneumothorax    Pt s/p paracentesis 3/18 with 1.2L fluid removal  Pt's appetite improving; eating 50-100% of meals and drinking some Ensure. Per chart, pt weight stable since admit. Continue supplements and MVI.     Medications reviewed and include: apremilast, aspirin, solu-medrol, MVI, ceftriaxone  Labs reviewed: Na 133(L), Cl 98(L), BUN 34(H), Ca 8.8(L) Wbc- 14.3(H), Hgb 8.1(L), Hct 25.0(L) cbgs- 127, 196 x 24hrs  Diet Order:  DIET DYS 3 Room service appropriate? Yes; Fluid consistency: Thin  EDUCATION NEEDS:   Education needs have been addressed  Skin:  Reviewed RN Assessment  Last BM:  3/15- type 4  Height:   Ht Readings from Last 1 Encounters:  11/01/17 5\' 11"  (1.803 m)    Weight:   Wt Readings from Last 1 Encounters:  11/05/17 139 lb 11.2 oz (63.4 kg)    Ideal Body Weight:  78.2 kg  BMI:  Body mass index is 19.48 kg/m.  Estimated Nutritional Needs:   Kcal:  1700-2000kcal/day   Protein:  94-107g/day   Fluid:  >1.6L/day   Betsey Holidayasey Greysen Devino MS, RD, LDN Pager #[phone]- [phone] After Hours Pager:  [phone]

## 2017-11-06 LAB — CULTURE, BLOOD (ROUTINE X 2)
Culture: NO GROWTH
Culture: NO GROWTH

## 2017-11-06 LAB — CBC
HCT: 24.8 % — ABNORMAL LOW (ref 40.0–52.0)
Hemoglobin: 8.2 g/dL — ABNORMAL LOW (ref 13.0–18.0)
MCH: 29.7 pg (ref 26.0–34.0)
MCHC: 33.3 g/dL (ref 32.0–36.0)
MCV: 89.3 fL (ref 80.0–100.0)
PLATELETS: 235 10*3/uL (ref 150–440)
RBC: 2.77 MIL/uL — AB (ref 4.40–5.90)
RDW: 15.3 % — ABNORMAL HIGH (ref 11.5–14.5)
WBC: 13.8 10*3/uL — AB (ref 3.8–10.6)

## 2017-11-06 MED ORDER — HYDROCODONE-ACETAMINOPHEN 5-325 MG PO TABS: 1.0000 | ORAL_TABLET | ORAL | 0 refills | Status: DC | PRN

## 2017-11-06 MED ORDER — ENSURE ENLIVE PO LIQD: 237.0000 mL | Freq: Two times a day (BID) | ORAL | 12 refills | Status: DC

## 2017-11-06 MED ORDER — CARVEDILOL 3.125 MG PO TABS: 3.1250 mg | ORAL_TABLET | Freq: Two times a day (BID) | ORAL | 11 refills | Status: DC

## 2017-11-06 NOTE — Care Management Important Message (Signed)
Important Message  Patient Details  Name: Sarita HaverJasper L Naeve MRN: [id number] Date of Birth: 04/28/1930   Medicare Important Message Given:  Yes  There is no initial IM notice scanned into Epic. Notice signed, one given to patient and the other to HIM for scanning  Eber HongGreene, Anayia Eugene R, RN 11/06/2017, 11:06 AM

## 2017-11-06 NOTE — Plan of Care (Signed)
  Progressing Clinical Measurements: Diagnostic test results will improve 11/06/2017 1126 - Progressing by Edison PaceJackson, Wentworth Edelen D, RN Respiratory complications will improve 11/06/2017 1126 - Progressing by Jerene DillingJackson, Kehaulani Fruin D, RN Cardiovascular complication will be avoided 11/06/2017 1126 - Progressing by Jerene DillingJackson, Lahari Suttles D, RN Activity: Risk for activity intolerance will decrease 11/06/2017 1126 - Progressing by Jerene DillingJackson, Becker Christopher D, RN Elimination: Will not experience complications related to bowel motility 11/06/2017 1126 - Progressing by Jerene DillingJackson, Desarae Placide D, RN Pain Managment: General experience of comfort will improve 11/06/2017 1126 - Progressing by Jerene DillingJackson, Shereen Marton D, RN

## 2017-11-06 NOTE — Clinical Social Work Note (Signed)
CSW received referral for SNF.  Case discussed with case manager and plan is to discharge home with home health.  CSW to sign off please re-consult if social work needs arise.  Gavino Fouch R. Kaidan Harpster, MSW, LCSWA [phone]  

## 2017-11-06 NOTE — Care Management (Signed)
Physical therapy has upgraded  discharge disposition to home with home health.  Obtained order for  RN PT Aide and social work.  Requested social work due to need for community resources- possible additional  in home assist, meals on wheel.  Provided med alert brochure. Home health agency preference is [facility].  Referral called and accepted.  Family to transport home.  Has a walker and cane in the home

## 2017-11-06 NOTE — Discharge Summary (Signed)
[employer] [facility] [place] [facility]   PATIENT NAME: Martin Bean    MR#:  [id number]  DATE OF BIRTH:  November 13, 1929  DATE OF ADMISSION:  11/01/2017 ADMITTING PHYSICIAN: Ihor Austin, MD  DATE OF DISCHARGE: 11/06/2017  PRIMARY CARE PHYSICIAN: Gracelyn Nurse, MD    ADMISSION DIAGNOSIS:  Pneumothorax [J93.9] Traumatic pneumothorax, initial encounter [S27.0XXA] Fall, initial encounter [id number]] Closed fracture of multiple ribs of right side, initial encounter [S22.41XA]  DISCHARGE DIAGNOSIS:  Active Problems:   Hemopneumothorax   Malnutrition of moderate degree   Hemothorax   SECONDARY DIAGNOSIS:   Past Medical History:  Diagnosis Date  . Diastolic heart failure (HCC)   . Family history of adverse reaction to anesthesia   . HTN (hypertension)   . Pneumonia   . Prostate enlargement   . Psoriasis     HOSPITAL COURSE:  84 year old male with history of essential hypertension and BPH who presented after a fall with shortness of breath.   1.  Acute hypoxic respiratory failure in the setting of moderate sized right hemothorax with atelectasis and small apical hydropneumothorax: Patient underwent thoracentesis will 1.2 L removed on March 18. Chest x-ray shows improvement in pleural effusion. He was seen by Dr Thelma Barge and does not need surgery at this time. He will follow up with Dr Thelma Barge. Continue incentive spirometer   2.  Acute blood loss anemia due to hemothorax Patient received 1 unit PRBC and Hemoglobin is stable.   3.  Pneumonia from atelectasis: HE has completed treatment with  Rocephin and azithromycin  4.  Multiple right rib fractures: Continue pain management PRN. Physical therapy consultation Is recommending HH at discharge  5.  Hyponatremia in the setting of pneumonia and hemothorax HCTZ has been discontinued Sodium has been stable.   6.  Essential hypertension: Due to low sodium I have discontinued HCTZ He was discharged on  Coreg due to persistent elevated BP.   7  BPH: Continue tamsulosin  8.  Acute kidney injury in the setting of rib fractures and dehydration This has improved with IV fluids  9.  Leukocytosis: This may be related to steroids that were given for underlying pneumonia    DISCHARGE CONDITIONS AND DIET:   Stable Regular diet  CONSULTS OBTAINED:    DRUG ALLERGIES:  No Known Allergies  DISCHARGE MEDICATIONS:   Allergies as of 11/06/2017   No Known Allergies     Medication List    STOP taking these medications   aspirin 81 MG EC tablet   clopidogrel 75 MG tablet Commonly known as:  PLAVIX     TAKE these medications   atorvastatin 40 MG tablet Commonly known as:  LIPITOR Take 1 tablet (40 mg total) by mouth daily at 6 PM.   cetaphil cream Apply 1 application topically 3 (three) times daily as needed (for psoriasis.).   feeding supplement (ENSURE ENLIVE) Liqd Take 237 mLs by mouth 2 (two) times daily between meals.   hydrochlorothiazide 12.5 MG tablet Commonly known as:  HYDRODIURIL Take 12.5 mg by mouth daily.   HYDROcodone-acetaminophen 5-325 MG tablet Commonly known as:  NORCO/VICODIN Take 1-2 tablets by mouth every 4 (four) hours as needed for moderate pain.   OTEZLA 30 MG Tabs Generic drug:  Apremilast Take 30 mg by mouth daily with supper.   sertraline 50 MG tablet Commonly known as:  ZOLOFT Take 50 mg by mouth at bedtime.   tamsulosin 0.4 MG Caps capsule Commonly known as:  FLOMAX Take 0.4 mg by mouth  daily at 2 PM.   triamcinolone cream 0.1 % Commonly known as:  KENALOG Apply 1 application topically 2 (two) times daily as needed (for psoriasis.).         Today   CHIEF COMPLAINT:  Doing well this am   VITAL SIGNS:  Blood pressure (!) 169/85, pulse 73, temperature 97.9 F (36.6 C), temperature source Oral, resp. rate 18, height 5\' 11"  (1.803 m), weight 64.2 kg (141 lb 8 oz), SpO2 94 %.   REVIEW OF SYSTEMS:  Review of Systems   Constitutional: Negative.  Negative for chills, fever and malaise/fatigue.  HENT: Negative.  Negative for ear discharge, ear pain, hearing loss, nosebleeds and sore throat.   Eyes: Negative.  Negative for blurred vision and pain.  Respiratory: Negative.  Negative for cough, hemoptysis, shortness of breath and wheezing.   Cardiovascular: Negative.  Negative for chest pain, palpitations and leg swelling.  Gastrointestinal: Negative.  Negative for abdominal pain, blood in stool, diarrhea, nausea and vomiting.  Genitourinary: Negative.  Negative for dysuria.  Musculoskeletal: Negative.  Negative for back pain.  Skin: Negative.   Neurological: Negative for dizziness, tremors, speech change, focal weakness, seizures and headaches.  Endo/Heme/Allergies: Negative.  Does not bruise/bleed easily.  Psychiatric/Behavioral: Negative.  Negative for depression, hallucinations and suicidal ideas.     PHYSICAL EXAMINATION:  GENERAL:  84 y.o.-year-old patient lying in the bed with no acute distress.  NECK:  Supple, no jugular venous distention. No thyroid enlargement, no tenderness.  LUNGS: Normal breath sounds bilaterally, no wheezing, rales,rhonchi  No use of accessory muscles of respiration.  CARDIOVASCULAR: S1, S2 normal. No murmurs, rubs, or gallops.  ABDOMEN: Soft, non-tender, non-distended. Bowel sounds present. No organomegaly or mass.  EXTREMITIES: No pedal edema, cyanosis, or clubbing.  PSYCHIATRIC: The patient is alert and oriented x 3.  SKIN: No obvious rash, lesion, or ulcer.   DATA REVIEW:   CBC Recent Labs  Lab 11/06/17 0453  WBC 13.8*  HGB 8.2*  HCT 24.8*  PLT 235    Chemistries  Recent Labs  Lab 11/04/17 0443  NA 133*  K 4.5  CL 98*  CO2 27  GLUCOSE 196*  BUN 34*  CREATININE 1.01  CALCIUM 8.8*    Cardiac Enzymes Recent Labs  Lab 11/01/17 0946  TROPONINI <0.03    Microbiology Results  @[email or web address] @  RADIOLOGY:  Dg Chest 1 View  Result Date:  11/05/2017 CLINICAL DATA:  Follow-up right pneumothorax EXAM: CHEST  1 VIEW COMPARISON:  11/04/17 FINDINGS: Cardiac shadow is stable. Aortic calcifications are again seen. Minimal residual right pleural effusion is again noted. No pneumothorax is seen. Persistent right basilar atelectasis is seen. No acute bony abnormality is seen. Stable rib fractures are noted. IMPRESSION: Minimal residual right pleural effusion and right basilar atelectasis. No pneumothorax is noted. Right rib fractures are again noted and stable. Electronically Signed   By: Alcide Clever M.D.   On: 11/05/2017 08:40   Dg Chest Port 1 View  Result Date: 11/04/2017 CLINICAL DATA:  History of right-sided rib fractures with associated right-sided hemothorax, post right-sided thoracentesis EXAM: PORTABLE CHEST 1 VIEW COMPARISON:  Chest radiograph-earlier same day; chest CT - 11/02/2017 FINDINGS: Grossly unchanged cardiac silhouette and mediastinal contours with atherosclerotic plaque within the thoracic aorta. Interval reduction in persistent small/trace right-sided effusion post thoracentesis. No pneumothorax. Improved aeration of the right lung base with persistent bibasilar opacities, likely atelectasis. No new focal airspace opacities. No evidence of edema. A pacer lead overlies the peripheral aspect the right upper  lung. Known right-sided rib fractures not well demonstrated on the present examination IMPRESSION: 1. Interval reduction in persistent small/trace of sided effusion post thoracentesis. No pneumothorax. 2. Improved aeration of right lung base with persistent right basilar opacities, likely atelectasis. Electronically Signed   By: Simonne ComeJohn  Watts M.D.   On: 11/04/2017 12:25   [place] Thoracentesis Asp Pleural Space W/img Guide  Result Date: 11/04/2017 INDICATION: History of recent fall suffering several right-sided rib fractures with associated development of right-sided hemothorax. Please perform ultrasound-guided thoracentesis for  diagnostic and therapeutic purposes. EXAM: US THORACENTESIS ASP PLEURAL SPACE W/IMG GUIDE COMPARISON:  Chest radiograph - earlier same day; chest CT - 11/02/2017 MEDICATIONS: None. COMPLICATIONS: None immediate. TECHNIQUE: Informed written consent was obtained from the patient after a discussion of the risks, benefits and alternatives to treatment. A timeout was performed prior to the initiation of the procedure. Initial ultrasound scanning demonstrates a moderate to large sized right-sided anechoic pleural effusion. The lower chest was prepped and draped in the usual sterile fashion. 1% lidocaine was used for local anesthesia. An ultrasound image was saved for documentation purposes. An 8 Fr Safe-T-Centesis catheter was introduced. The thoracentesis was performed. The catheter was removed and a dressing was applied. The patient tolerated the procedure well without immediate post procedural complication. The patient was escorted to have an upright chest radiograph. FINDINGS: A total of approximately 1.2 liters of bloody fluid was removed. Requested samples were sent to the laboratory. IMPRESSION: Successful ultrasound-guided right sided thoracentesis yielding 1.2 liters of bloody pleural fluid. Electronically Signed   By: Simonne ComeJohn  Watts M.D.   On: 11/04/2017 12:26      Allergies as of 11/06/2017   No Known Allergies     Medication List    STOP taking these medications   aspirin 81 MG EC tablet   clopidogrel 75 MG tablet Commonly known as:  PLAVIX     TAKE these medications   atorvastatin 40 MG tablet Commonly known as:  LIPITOR Take 1 tablet (40 mg total) by mouth daily at 6 PM.   cetaphil cream Apply 1 application topically 3 (three) times daily as needed (for psoriasis.).   feeding supplement (ENSURE ENLIVE) Liqd Take 237 mLs by mouth 2 (two) times daily between meals.   hydrochlorothiazide 12.5 MG tablet Commonly known as:  HYDRODIURIL Take 12.5 mg by mouth daily.    HYDROcodone-acetaminophen 5-325 MG tablet Commonly known as:  NORCO/VICODIN Take 1-2 tablets by mouth every 4 (four) hours as needed for moderate pain.   OTEZLA 30 MG Tabs Generic drug:  Apremilast Take 30 mg by mouth daily with supper.   sertraline 50 MG tablet Commonly known as:  ZOLOFT Take 50 mg by mouth at bedtime.   tamsulosin 0.4 MG Caps capsule Commonly known as:  FLOMAX Take 0.4 mg by mouth daily at 2 PM.   triamcinolone cream 0.1 % Commonly known as:  KENALOG Apply 1 application topically 2 (two) times daily as needed (for psoriasis.).           Management plans discussed with the patient and he is in agreement. [facility]  Patient should follow up with PCP  CODE STATUS:     Code Status Orders  (From admission, onward)        Start     Ordered   11/01/17 1226  Full code  Continuous     11/01/17 1225    Code Status History    Date Active Date Inactive Code Status Order  ID Comments User Context   10/02/2017 15:25 10/03/2017 17:56 Full Code [id number]  Annice Needy, MD Inpatient   08/14/2017 09:35 08/15/2017 22:14 Full Code [id number]  Milagros Loll, MD ED    Advance Directive Documentation     Most Recent Value  Type of Advance Directive  Healthcare Power of Attorney, Living will  Pre-existing out of facility DNR order (yellow form or pink MOST form)  No data  "MOST" Form in Place?  No data      TOTAL TIME TAKING CARE OF THIS PATIENT: 38 minutes.    Note: This dictation was prepared with Dragon dictation along with smaller phrase technology. Any transcriptional errors that result from this process are unintentional.  Mohamadou Maciver M.D on 11/06/2017 at 11:11 AM  Between 7am to 6pm - Pager - [phone] After 6pm go to [email or web address] - password [email or web address]  [facility]  Office  [phone]  CC: Primary care physician; Gracelyn Nurse, MD

## 2017-11-07 LAB — BODY FLUID CULTURE
Culture: NO GROWTH
Gram Stain: NONE SEEN

## 2017-11-08 ENCOUNTER — Telehealth (INDEPENDENT_AMBULATORY_CARE_PROVIDER_SITE_OTHER): Payer: Self-pay

## 2017-11-08 NOTE — Telephone Encounter (Signed)
Called stating that the patient had a bad fall recently and he was hospitalized with a cracked rib. At the time of his hospital stay, they tool the patient off of his blood thinner.   She would like to know if this is temporary, or should the patient resume the blood thinners today or when?

## 2017-11-11 NOTE — Telephone Encounter (Signed)
Called back to let Ms. Walker know that per Dr. Wyn Quakerew, it's okay for Martin Bean to just take one Aspirin 81mg  daily until he follows up here in the office, and at that point they can revisit the patients' medications pertaining to the blood thinners to see if he would need to restart those. Had to leave a message.

## 2017-11-12 ENCOUNTER — Encounter (INDEPENDENT_AMBULATORY_CARE_PROVIDER_SITE_OTHER): Payer: Medicare PPO

## 2017-11-12 ENCOUNTER — Ambulatory Visit (INDEPENDENT_AMBULATORY_CARE_PROVIDER_SITE_OTHER): Payer: Medicare PPO | Admitting: Vascular Surgery

## 2017-11-13 ENCOUNTER — Other Ambulatory Visit: Payer: Self-pay

## 2017-11-14 ENCOUNTER — Other Ambulatory Visit: Payer: Self-pay

## 2017-11-14 DIAGNOSIS — Z9889 Other specified postprocedural states: Secondary | ICD-10-CM

## 2017-11-14 NOTE — Progress Notes (Signed)
Chest xray order placed at this time.

## 2017-11-15 ENCOUNTER — Ambulatory Visit
Admission: RE | Admit: 2017-11-15 | Discharge: 2017-11-15 | Disposition: A | Discharge status: Home / Self Care | Payer: Medicare PPO | Source: Ambulatory Visit | Attending: Cardiothoracic Surgery | Admitting: Cardiothoracic Surgery

## 2017-11-15 ENCOUNTER — Encounter: Payer: Self-pay | Admitting: Cardiothoracic Surgery

## 2017-11-15 ENCOUNTER — Ambulatory Visit
Admission: RE | Admit: 2017-11-15 | Discharge: 2017-11-15 | Disposition: A | Discharge status: Home / Self Care | Payer: Medicare PPO | Source: Ambulatory Visit | Attending: Radiology | Admitting: Radiology

## 2017-11-15 ENCOUNTER — Ambulatory Visit (INDEPENDENT_AMBULATORY_CARE_PROVIDER_SITE_OTHER): Payer: Medicare PPO | Admitting: Cardiothoracic Surgery

## 2017-11-15 VITALS — BP 129/72 | HR 58 | Temp 97.8°F | Ht 70.0 in | Wt 149.0 lb

## 2017-11-15 DIAGNOSIS — J9811 Atelectasis: Secondary | ICD-10-CM | POA: Diagnosis not present

## 2017-11-15 DIAGNOSIS — X58XXXA Exposure to other specified factors, initial encounter: Secondary | ICD-10-CM | POA: Insufficient documentation

## 2017-11-15 DIAGNOSIS — Z09 Encounter for follow-up examination after completed treatment for conditions other than malignant neoplasm: Secondary | ICD-10-CM | POA: Diagnosis not present

## 2017-11-15 DIAGNOSIS — J90 Pleural effusion, not elsewhere classified: Secondary | ICD-10-CM

## 2017-11-15 DIAGNOSIS — S2241XA Multiple fractures of ribs, right side, initial encounter for closed fracture: Secondary | ICD-10-CM | POA: Diagnosis not present

## 2017-11-15 DIAGNOSIS — Z9889 Other specified postprocedural states: Secondary | ICD-10-CM | POA: Insufficient documentation

## 2017-11-15 NOTE — Procedures (Signed)
Ultrasound-guided therapeutic right thoracentesis performed yielding 900 cc of dark bloody fluid. No immediate complications. Follow-up chest x-ray pending.

## 2017-11-15 NOTE — Progress Notes (Signed)
  Patient ID: Martin Bean, male   DOB: 12/01/1929, 84 y.o.   MRN: [id number]  HISTORY: This patient returns today in follow-up.  He was discharged after suffering multiple right-sided rib fractures and a pleural effusion.  He was discharged from the hospital about a week ago to home.  He states that he is able to get up from walk in the home with his walker.  He does get short of breath however.  He has no other complaints today.   Vitals:   11/15/17 0856  BP: 129/72  Pulse: (!) 58  Temp: 97.8 F (36.6 C)  SpO2: 96%     EXAM:    Resp: Lungs are clear but with markedly diminished breath sounds at the right base.  No respiratory distress, normal effort. Heart:  Regular with short systolic murmurs Abd:  Abdomen is soft, non distended and non tender. No masses are palpable.  There is no rebound and no guarding.  Neurological: Alert and oriented to person, place, and time. Coordination normal.  Skin: Skin is warm and dry. No rash noted. No diaphoretic. No erythema. No pallor.  Psychiatric: Normal mood and affect. Normal behavior. Judgment and thought content normal.    ASSESSMENT: I have independently reviewed the patient's chest x-ray.  Chest x-ray today shows a moderate size right pleural effusion.   PLAN:   I had a long discussion today with the patient and his daughter.  I have recommended that he undergo ultrasound-guided thoracentesis.  We will attempt to get that arranged for today.  Otherwise this will be performed next week and I will follow-up with him in 1 week.    Hulda Marinimothy Daliana Leverett, MD

## 2017-11-18 ENCOUNTER — Other Ambulatory Visit: Payer: Self-pay

## 2017-11-18 DIAGNOSIS — J90 Pleural effusion, not elsewhere classified: Secondary | ICD-10-CM

## 2017-11-20 ENCOUNTER — Telehealth: Payer: Self-pay | Admitting: Cardiothoracic Surgery

## 2017-11-20 NOTE — Telephone Encounter (Signed)
Patients daughter is calling said Dr. Laural BenesJohnson did a chest xray today on her dad, patients daughter said due to Dr. Laural BenesJohnson doing a chest xray per you and Dr. Thelma Bargeaks her father didn't need to come in for this appointment. Please advise.

## 2017-11-21 NOTE — Telephone Encounter (Signed)
Spoke with patient's daughter Alvis LemmingsDawn and she stated that yesterday in the afternoon they went to see Dr. Laural BenesJohnson (Patient's PCP). [name] stated that Dr. Laural BenesJohnson heard her dad's lungs and that it sounded like he had fluid in his lungs. He then ordered for patient to have a chest X-Ray but we do not have results yet. I told her that we would want to see her dad tomorrow just in case he needs a thoracentesis again. [name] stated that she will bring him tomorrow morning. [name] had no further questions.

## 2017-11-22 ENCOUNTER — Ambulatory Visit (INDEPENDENT_AMBULATORY_CARE_PROVIDER_SITE_OTHER): Payer: Medicare PPO | Admitting: Cardiothoracic Surgery

## 2017-11-22 ENCOUNTER — Encounter: Payer: Self-pay | Admitting: Cardiothoracic Surgery

## 2017-11-22 VITALS — BP 118/68 | HR 66 | Temp 97.9°F | Ht 71.0 in | Wt 150.0 lb

## 2017-11-22 DIAGNOSIS — J90 Pleural effusion, not elsewhere classified: Secondary | ICD-10-CM | POA: Diagnosis not present

## 2017-11-22 NOTE — Progress Notes (Signed)
  Patient ID: Martin Bean, male   DOB: 10/02/1929, 84 y.o.   MRN: [id number]  HISTORY: Martin Bean comes in in follow-up of his right pleural effusion.  He was seen here last week and underwent a thoracentesis of 900 cc.  His post thoracentesis chest x-ray showed only minimal pleural effusion remaining.  He states that he went to see his primary care physician at the [facility] 2 days ago where a repeat chest x-ray was performed.  This revealed a small pleural effusion.  I was able to obtain those x-rays and reviewed those.  The patient tells me that he does not feel any worse today than he did immediately after his last thoracentesis although his daughter who accompanies him today thinks that he may be more short of breath now than he was right after his thoracentesis.  In any event he does not complain of any significant pain at the present time.  He is not short of breath right now.  Despite his advanced age he still was pretty active and was able to walk in here.   Vitals:   11/22/17 0919  BP: 118/68  Pulse: 66  Temp: 97.9 F (36.6 C)  SpO2: 94%     EXAM:    Resp: Lungs are clear with the exception of decreased breath sounds at the right base.  No respiratory distress, normal effort. Heart:  Regular without murmurs Abd:  Abdomen is soft, non distended and non tender. No masses are palpable.  There is no rebound and no guarding.  Neurological: Alert and oriented to person, place, and time. Coordination normal.  Skin: Skin is warm and dry. No rash noted. No diaphoretic. No erythema. No pallor.  There is a large ecchymotic area over the lower aspect of the right hemithorax  Psychiatric: Normal mood and affect. Normal behavior. Judgment and thought content normal.    ASSESSMENT: I have independently reviewed his chest x-ray.  There is a small pleural effusion.   PLAN:   At the present time the patient does not appear to be significantly impaired by his pleural effusion.  After  extensive discussion with he and his daughter we have elected to have him follow-up with his primary care physician next week.  He does not want to come back to see me if we are not going to provide any surgical services.  I explained to him that I would be happy to see him if he were to need anything further done.    Hulda Marinimothy Lynard Postlewait, MD

## 2017-11-22 NOTE — Patient Instructions (Addendum)
Please follow up with Dr. Letitia LibraJohnston in a week.

## 2017-12-03 ENCOUNTER — Ambulatory Visit (INDEPENDENT_AMBULATORY_CARE_PROVIDER_SITE_OTHER): Payer: Self-pay | Admitting: Internal Medicine

## 2017-12-03 DIAGNOSIS — R0602 Shortness of breath: Secondary | ICD-10-CM

## 2017-12-03 DIAGNOSIS — I272 Pulmonary hypertension, unspecified: Secondary | ICD-10-CM

## 2017-12-13 ENCOUNTER — Ambulatory Visit (INDEPENDENT_AMBULATORY_CARE_PROVIDER_SITE_OTHER): Payer: Medicare PPO

## 2017-12-13 ENCOUNTER — Ambulatory Visit (INDEPENDENT_AMBULATORY_CARE_PROVIDER_SITE_OTHER): Payer: Medicare PPO | Admitting: Vascular Surgery

## 2017-12-13 ENCOUNTER — Encounter (INDEPENDENT_AMBULATORY_CARE_PROVIDER_SITE_OTHER): Payer: Self-pay | Admitting: Vascular Surgery

## 2017-12-13 VITALS — BP 156/68 | HR 55 | Resp 16 | Wt 142.0 lb

## 2017-12-13 DIAGNOSIS — I6522 Occlusion and stenosis of left carotid artery: Secondary | ICD-10-CM

## 2017-12-13 DIAGNOSIS — I10 Essential (primary) hypertension: Secondary | ICD-10-CM

## 2017-12-13 DIAGNOSIS — E785 Hyperlipidemia, unspecified: Secondary | ICD-10-CM

## 2017-12-13 NOTE — Progress Notes (Signed)
Subjective:    Patient ID: Martin HaverJasper L Bean, male    DOB: 07/08/1930, 84 y.o.   MRN: [id number] Chief Complaint  Patient presents with  . Follow-up    1 month Carotid U/S   Patient presents for his second postoperative follow-up.  The patient is status post a left carotid endarterectomy on October 02, 2017.  The patient was seen with his daughter.  The patient continues to have an uneventful postoperative course with the exception of some "swallowing difficulties".  The daughter notes that a few weeks ago the patient was having some issues swallowing liquid.  The patient states that this has improved.  The patient denies any coughing or choking after ingesting liquid or solid food.  Patient denies any shortness of breath or hoarseness.  The patient has discussed this with his primary care physician who did not feel a referral to ENT/gastroenterology was warranted.  The patient underwent a bilateral carotid duplex which was notable for a patent left carotid endarterectomy with no hemodynamically significant stenosis.  Right internal carotid artery with 40 to 59% stenosis.  Bilateral vertebral arteries demonstrate antegrade flow.  Normal flow to hemodynamics were seen in the bilateral subclavian arteries.  Review of Systems  Constitutional: Negative.   HENT: Negative.   Eyes: Negative.   Respiratory: Negative.   Cardiovascular: Negative.   Gastrointestinal: Negative.   Endocrine: Negative.   Genitourinary: Negative.   Musculoskeletal: Negative.   Skin: Negative.   Allergic/Immunologic: Negative.   Neurological: Negative.   Hematological: Negative.   Psychiatric/Behavioral: Negative.       Objective:   Physical Exam  Constitutional: He is oriented to person, place, and time. He appears well-developed and well-nourished. No distress.  HENT:  Head: Normocephalic and atraumatic.  Right Ear: External ear normal.  Left Ear: External ear normal.  Eyes: Pupils are equal, round, and reactive  to light. Conjunctivae and EOM are normal.  Neck: Normal range of motion.  Left endarterectomy site: Lung well.  No carotid bruit noted. No right carotid bruit noted.  Cardiovascular: Normal rate, regular rhythm, normal heart sounds and intact distal pulses.  Pulses:      Radial pulses are 2+ on the right side, and 2+ on the left side.  Pulmonary/Chest: Effort normal and breath sounds normal.  Musculoskeletal: Normal range of motion. He exhibits no edema.  Neurological: He is alert and oriented to person, place, and time.  Skin: Skin is warm and dry. He is not diaphoretic.  Psychiatric: He has a normal mood and affect. His behavior is normal. Judgment and thought content normal.  Vitals reviewed.  BP (!) 156/68 (BP Location: Right Arm, Patient Position: Sitting)   Pulse (!) 55   Resp 16   Wt 142 lb (64.4 kg)   BMI 19.80 kg/m   Past Medical History:  Diagnosis Date  . Carotid arterial disease (HCC) 10/02/2017  . CVA (cerebral vascular accident) (HCC) 08/14/2017  . Diastolic heart failure (HCC)   . Family history of adverse reaction to anesthesia   . HTN (hypertension)   . Hydropneumothorax 11/01/2017  . Hyperlipidemia 10/09/2017  . Malnutrition of moderate degree 11/02/2017  . Pneumonia   . Prostate enlargement   . Psoriasis    Social History   Socioeconomic History  . Marital status: Widowed    Spouse name: Not on file  . Number of children: Not on file  . Years of education: Not on file  . Highest education level: Not on file  Occupational History  .  Occupation: retired  Engineer, production  . Financial resource strain: Not on file  . Food insecurity:    Worry: Not on file    Inability: Not on file  . Transportation needs:    Medical: Not on file    Non-medical: Not on file  Tobacco Use  . Smoking status: Former Smoker    Types: Cigarettes    Last attempt to quit: 09/24/2013    Years since quitting: 4.2  . Smokeless tobacco: Never Used  Substance and Sexual Activity  .  Alcohol use: No    Frequency: Never  . Drug use: No  . Sexual activity: Not on file  Lifestyle  . Physical activity:    Days per week: Not on file    Minutes per session: Not on file  . Stress: Not on file  Relationships  . Social connections:    Talks on phone: Not on file    Gets together: Not on file    Attends religious service: Not on file    Active member of club or organization: Not on file    Attends meetings of clubs or organizations: Not on file    Relationship status: Not on file  . Intimate partner violence:    Fear of current or ex partner: Not on file    Emotionally abused: Not on file    Physically abused: Not on file    Forced sexual activity: Not on file  Other Topics Concern  . Not on file  Social History Narrative  . Not on file   Past Surgical History:  Procedure Laterality Date  . ENDARTERECTOMY Left 10/02/2017   Procedure: ENDARTERECTOMY CAROTID;  Surgeon: Annice Needy, MD;  Location: [facility] ORS;  Service: Vascular;  Laterality: Left;  . HERNIA REPAIR     Family History  Problem Relation Age of Onset  . Stroke Mother    No Known Allergies     Assessment & Plan:  Patient presents for his second postoperative follow-up.  The patient is status post a left carotid endarterectomy on October 02, 2017.  The patient was seen with his daughter.  The patient continues to have an uneventful postoperative course with the exception of some "swallowing difficulties".  The daughter notes that a few weeks ago the patient was having some issues swallowing liquid.  The patient states that this has improved.  The patient denies any coughing or choking after ingesting liquid or solid food.  Patient denies any shortness of breath or hoarseness.  The patient has discussed this with his primary care physician who did not feel a referral to ENT/gastroenterology was warranted.  The patient underwent a bilateral carotid duplex which was notable for a patent left carotid  endarterectomy with no hemodynamically significant stenosis.  Right internal carotid artery with 40 to 59% stenosis.  Bilateral vertebral arteries demonstrate antegrade flow.  Normal flow to hemodynamics were seen in the bilateral subclavian arteries.  1. Stenosis of left carotid artery - Stable Patient presents for second postoperative follow-up Patient with a patent left carotid endarterectomy site with no evidence of restenosis Patient was having some dysphasia few weeks ago after a fall and a brief stay in the hospital.  The patient notes that this has improved.  I had a long discussion with the patient and his daughter that if he continues to have issues swallowing it is very important that he contact our office and I will refer him to gastroenterology to assess his swallowing.  We discussed the  risk of aspiration pneumonia.  The patient expresses his understanding and at this point does not want to move forward with any referrals. It is okay for the patient to move forward and have cataract surgery I have discussed with the patient at length the risk factors for and pathogenesis of atherosclerotic disease and encouraged a healthy diet, regular exercise regimen and blood pressure / glucose control.  Patient was instructed to contact our office in the interim with problems such as arm / leg weakness or numbness, speech / swallowing difficulty or temporary monocular blindness. The patient expresses their understanding.   - VAS US CAROTID; Future  2. Hyperlipidemia, unspecified hyperlipidemia type - Stable Encouraged good control as its slows the progression of atherosclerotic disease  3. Essential hypertension - Stable Encouraged good control as its slows the progression of atherosclerotic disease  Current Outpatient Medications on File Prior to Visit  Medication Sig Dispense Refill  . aspirin EC 81 MG tablet Take by mouth.    Marland Kitchen atorvastatin (LIPITOR) 40 MG tablet Take 1 tablet (40 mg total)  by mouth daily at 6 PM. 30 tablet 0  . carvedilol (COREG) 3.125 MG tablet Take 1 tablet (3.125 mg total) by mouth 2 (two) times daily. (Patient not taking: Reported on 11/22/2017) 60 tablet 11  . clopidogrel (PLAVIX) 75 MG tablet Take by mouth.    . feeding supplement, ENSURE ENLIVE, (ENSURE ENLIVE) LIQD Take 237 mLs by mouth 2 (two) times daily between meals. 237 mL 12  . HYDROcodone-acetaminophen (NORCO/VICODIN) 5-325 MG tablet Take 1-2 tablets by mouth every 4 (four) hours as needed for moderate pain. 30 tablet 0  . sertraline (ZOLOFT) 50 MG tablet Take 50 mg by mouth at bedtime.     . tamsulosin (FLOMAX) 0.4 MG CAPS capsule Take 0.4 mg by mouth daily at 2 PM.      No current facility-administered medications on file prior to visit.    There are no Patient Instructions on file for this visit. No follow-ups on file.  Tzion Wedel A Priscila Bean, PA-C

## 2018-02-18 ENCOUNTER — Encounter: Payer: Self-pay | Admitting: *Deleted

## 2018-02-25 ENCOUNTER — Encounter
Admission: RE | Disposition: A | Discharge status: Home / Self Care | Payer: Self-pay | Source: Ambulatory Visit | Attending: Ophthalmology

## 2018-02-25 ENCOUNTER — Ambulatory Visit
Admission: RE | Admit: 2018-02-25 | Discharge: 2018-02-25 | Disposition: A | Discharge status: Home / Self Care | Payer: Medicare PPO | Source: Ambulatory Visit | Attending: Ophthalmology | Admitting: Ophthalmology

## 2018-02-25 ENCOUNTER — Encounter: Payer: Self-pay | Admitting: Anesthesiology

## 2018-02-25 ENCOUNTER — Ambulatory Visit: Payer: Medicare PPO | Admitting: Anesthesiology

## 2018-02-25 DIAGNOSIS — Z79899 Other long term (current) drug therapy: Secondary | ICD-10-CM | POA: Diagnosis not present

## 2018-02-25 DIAGNOSIS — Z7982 Long term (current) use of aspirin: Secondary | ICD-10-CM | POA: Diagnosis not present

## 2018-02-25 DIAGNOSIS — F329 Major depressive disorder, single episode, unspecified: Secondary | ICD-10-CM | POA: Diagnosis not present

## 2018-02-25 DIAGNOSIS — H2511 Age-related nuclear cataract, right eye: Secondary | ICD-10-CM | POA: Diagnosis not present

## 2018-02-25 DIAGNOSIS — E7800 Pure hypercholesterolemia, unspecified: Secondary | ICD-10-CM | POA: Insufficient documentation

## 2018-02-25 DIAGNOSIS — N400 Enlarged prostate without lower urinary tract symptoms: Secondary | ICD-10-CM | POA: Insufficient documentation

## 2018-02-25 DIAGNOSIS — Z87891 Personal history of nicotine dependence: Secondary | ICD-10-CM | POA: Insufficient documentation

## 2018-02-25 HISTORY — PX: CATARACT EXTRACTION W/PHACO: SHX586

## 2018-02-25 HISTORY — DX: Depression, unspecified: F32.A

## 2018-02-25 HISTORY — DX: Major depressive disorder, single episode, unspecified: F32.9

## 2018-02-25 HISTORY — DX: Unspecified hearing loss, unspecified ear: H91.90

## 2018-02-25 SURGERY — PHACOEMULSIFICATION, CATARACT, WITH IOL INSERTION
Anesthesia: Monitor Anesthesia Care | Site: Eye | Laterality: Right | Wound class: "Clean "

## 2018-02-25 MED ORDER — HYDRALAZINE HCL 20 MG/ML IJ SOLN
INTRAMUSCULAR | Status: AC
  Filled 2018-02-25: qty 1

## 2018-02-25 MED ORDER — NA CHONDROIT SULF-NA HYALURON 40-17 MG/ML IO SOLN
INTRAOCULAR | Status: DC | PRN
  Administered 2018-02-25: 1 mL via INTRAOCULAR

## 2018-02-25 MED ORDER — POVIDONE-IODINE 5 % OP SOLN
OPHTHALMIC | Status: AC
  Filled 2018-02-25: qty 30

## 2018-02-25 MED ORDER — PHENYLEPHRINE HCL 10 % OP SOLN
OPHTHALMIC | Status: AC
  Administered 2018-02-25 (×2): 1 [drp]
  Administered 2018-02-25: 09:00:00
  Administered 2018-02-25: 1 [drp]
  Filled 2018-02-25: qty 5

## 2018-02-25 MED ORDER — HYDRALAZINE HCL 20 MG/ML IJ SOLN
INTRAMUSCULAR | Status: DC | PRN
  Administered 2018-02-25 (×3): 5 mg via INTRAVENOUS

## 2018-02-25 MED ORDER — NA CHONDROIT SULF-NA HYALURON 40-17 MG/ML IO SOLN
INTRAOCULAR | Status: AC
  Filled 2018-02-25: qty 1

## 2018-02-25 MED ORDER — FENTANYL CITRATE (PF) 100 MCG/2ML IJ SOLN
INTRAMUSCULAR | Status: AC
  Filled 2018-02-25: qty 2

## 2018-02-25 MED ORDER — SODIUM CHLORIDE 0.9 % IJ SOLN
INTRAMUSCULAR | Status: AC
  Filled 2018-02-25: qty 10

## 2018-02-25 MED ORDER — ONDANSETRON HCL 4 MG/2ML IJ SOLN
INTRAMUSCULAR | Status: AC
  Filled 2018-02-25: qty 2

## 2018-02-25 MED ORDER — CARBACHOL 0.01 % IO SOLN
INTRAOCULAR | Status: DC | PRN
  Administered 2018-02-25: 0.5 mL via INTRAOCULAR

## 2018-02-25 MED ORDER — MOXIFLOXACIN HCL 0.5 % OP SOLN
OPHTHALMIC | Status: AC
  Filled 2018-02-25: qty 3

## 2018-02-25 MED ORDER — POVIDONE-IODINE 5 % OP SOLN
OPHTHALMIC | Status: DC | PRN
  Administered 2018-02-25: 1 via OPHTHALMIC

## 2018-02-25 MED ORDER — TETRACAINE HCL 0.5 % OP SOLN
OPHTHALMIC | Status: AC
  Administered 2018-02-25: 1 [drp] via OPHTHALMIC
  Filled 2018-02-25: qty 4

## 2018-02-25 MED ORDER — GLYCOPYRROLATE 0.2 MG/ML IJ SOLN
INTRAMUSCULAR | Status: DC | PRN
  Administered 2018-02-25: 0.1 mg via INTRAVENOUS

## 2018-02-25 MED ORDER — PHENYLEPHRINE HCL 10 % OP SOLN: 1.0000 [drp] | OPHTHALMIC | Status: AC

## 2018-02-25 MED ORDER — CYCLOPENTOLATE HCL 2 % OP SOLN
OPHTHALMIC | Status: AC
  Administered 2018-02-25 (×4): 1 [drp]
  Filled 2018-02-25: qty 2

## 2018-02-25 MED ORDER — FENTANYL CITRATE (PF) 100 MCG/2ML IJ SOLN
INTRAMUSCULAR | Status: DC | PRN
  Administered 2018-02-25: 50 ug via INTRAVENOUS

## 2018-02-25 MED ORDER — GLYCOPYRROLATE 0.2 MG/ML IJ SOLN
INTRAMUSCULAR | Status: AC
  Filled 2018-02-25: qty 1

## 2018-02-25 MED ORDER — EPINEPHRINE PF 1 MG/ML IJ SOLN
INTRAMUSCULAR | Status: AC
  Filled 2018-02-25: qty 2

## 2018-02-25 MED ORDER — LIDOCAINE HCL (PF) 4 % IJ SOLN
INTRAMUSCULAR | Status: AC
  Filled 2018-02-25: qty 5

## 2018-02-25 MED ORDER — MOXIFLOXACIN HCL 0.5 % OP SOLN
OPHTHALMIC | Status: DC | PRN
  Administered 2018-02-25: 0.2 mL via OPHTHALMIC

## 2018-02-25 MED ORDER — TETRACAINE HCL 0.5 % OP SOLN
1.0000 [drp] | OPHTHALMIC | Status: AC | PRN
  Administered 2018-02-25 (×5): 1 [drp] via OPHTHALMIC

## 2018-02-25 MED ORDER — LIDOCAINE HCL (PF) 4 % IJ SOLN
INTRAOCULAR | Status: DC | PRN
  Administered 2018-02-25: 4 mL via OPHTHALMIC

## 2018-02-25 MED ORDER — SODIUM CHLORIDE 0.9 % IV SOLN
INTRAVENOUS | Status: DC
  Administered 2018-02-25: 09:00:00 via INTRAVENOUS

## 2018-02-25 MED ORDER — ONDANSETRON HCL 4 MG/2ML IJ SOLN
INTRAMUSCULAR | Status: DC | PRN
  Administered 2018-02-25: 4 mg via INTRAVENOUS

## 2018-02-25 MED ORDER — CYCLOPENTOLATE HCL 2 % OP SOLN: 1.0000 [drp] | OPHTHALMIC | Status: AC

## 2018-02-25 MED ORDER — EPINEPHRINE PF 1 MG/ML IJ SOLN
INTRAOCULAR | Status: DC | PRN
  Administered 2018-02-25: 10:00:00 via OPHTHALMIC

## 2018-02-25 MED ORDER — MOXIFLOXACIN HCL 0.5 % OP SOLN: 1.0000 [drp] | OPHTHALMIC | Status: DC | PRN

## 2018-02-25 SURGICAL SUPPLY — 16 items
GLOVE BIO SURGEON STRL SZ8 (GLOVE) ×3 IMPLANT
GLOVE BIOGEL M 6.5 STRL (GLOVE) ×3 IMPLANT
GLOVE SURG LX 8.0 MICRO (GLOVE) ×2
GLOVE SURG LX STRL 8.0 MICRO (GLOVE) ×1 IMPLANT
GOWN STRL REUS W/ TWL LRG LVL3 (GOWN DISPOSABLE) ×2 IMPLANT
GOWN STRL REUS W/TWL LRG LVL3 (GOWN DISPOSABLE) ×4
LABEL CATARACT MEDS ST (LABEL) ×3 IMPLANT
LENS IOL TECNIS ITEC 20.0 (Intraocular Lens) ×2 IMPLANT
PACK CATARACT (MISCELLANEOUS) ×3 IMPLANT
PACK CATARACT BRASINGTON LX (MISCELLANEOUS) ×3 IMPLANT
PACK EYE AFTER SURG (MISCELLANEOUS) ×3 IMPLANT
SOL BSS BAG (MISCELLANEOUS) ×3
SOLUTION BSS BAG (MISCELLANEOUS) ×1 IMPLANT
SYR 5ML LL (SYRINGE) ×3 IMPLANT
WATER STERILE IRR 250ML POUR (IV SOLUTION) ×3 IMPLANT
WIPE NON LINTING 3.25X3.25 (MISCELLANEOUS) ×3 IMPLANT

## 2018-02-25 NOTE — Discharge Instructions (Signed)
Eye Surgery Discharge Instructions    Expect mild scratchy sensation or mild soreness. DO NOT RUB YOUR EYE!  The day of surgery:  Minimal physical activity, but bed rest is not required  No reading, computer work, or close hand work  No bending, lifting, or straining.  May watch TV  For 24 hours:  No driving, legal decisions, or alcoholic beverages  Safety precautions  Eat anything you prefer: It is better to start with liquids, then soup then solid foods.  _____ Eye patch should be worn until postoperative exam tomorrow.  ____ Solar shield eyeglasses should be worn for comfort in the sunlight/patch while sleeping  Resume all regular medications including aspirin or Coumadin if these were discontinued prior to surgery. You may shower, bathe, shave, or wash your hair. Tylenol may be taken for mild discomfort.  Call your doctor if you experience significant pain, nausea, or vomiting, fever > 101 or other signs of infection. [phone] or [phone] Specific instructions:  Follow-up Information    Galen ManilaPorfilio, William, MD Follow up.   Specialty:  Ophthalmology Why:  July 9 at 9:45am Contact information: [address] [place] [address] [phone]

## 2018-02-25 NOTE — H&P (Signed)
All labs reviewed. Abnormal studies sent to patients PCP when indicated.  Previous H&P reviewed, patient examined, there are NO CHANGES.  Gagan Dillion Porfilio7/9/20199:33 AM

## 2018-02-25 NOTE — Transfer of Care (Signed)
Immediate Anesthesia Transfer of Care Note  Patient: Martin Bean  Procedure(s) Performed: CATARACT EXTRACTION PHACO AND INTRAOCULAR LENS PLACEMENT (IOC) (Right Eye)  Patient Location: PACU and Short Stay  Anesthesia Type:MAC  Level of Consciousness: awake, alert , oriented and patient cooperative  Airway & Oxygen Therapy: Patient Spontanous Breathing  Post-op Assessment: Report given to RN, Post -op Vital signs reviewed and stable and Patient moving all extremities  Post vital signs: Reviewed and stable  Last Vitals:  Vitals Value Taken Time  BP    Temp    Pulse    Resp    SpO2      Last Pain:  Vitals:   02/25/18 0831  TempSrc: Tympanic  PainSc: 0-No pain         Complications: No apparent anesthesia complications

## 2018-02-25 NOTE — Anesthesia Post-op Follow-up Note (Signed)
Anesthesia QCDR form completed.        

## 2018-02-25 NOTE — Op Note (Signed)
PREOPERATIVE DIAGNOSIS:  Nuclear sclerotic cataract of the right eye.   POSTOPERATIVE DIAGNOSIS:  NUCLEAR SCLEROTIC CATARACT RIGHT EYE   OPERATIVE PROCEDURE: Procedure(s): CATARACT EXTRACTION PHACO AND INTRAOCULAR LENS PLACEMENT (IOC)   SURGEON:  Galen ManilaWilliam Essence Merle, MD.   ANESTHESIA:  Anesthesiologist: Lenard SimmerKarenz, Andrew, MD CRNA: Sherol DadeMacMang, Josephine H, CRNA  1.      Managed anesthesia care. 2.      0.521ml of Shugarcaine was instilled in the eye following the paracentesis.   COMPLICATIONS:  None.   TECHNIQUE:   Stop and chop   DESCRIPTION OF PROCEDURE:  The patient was examined and consented in the preoperative holding area where the aforementioned topical anesthesia was applied to the right eye and then brought back to the Operating Room where the right eye was prepped and draped in the usual sterile ophthalmic fashion and a lid speculum was placed. A paracentesis was created with the side port blade and the anterior chamber was filled with viscoelastic. A near clear corneal incision was performed with the steel keratome. A continuous curvilinear capsulorrhexis was performed with a cystotome followed by the capsulorrhexis forceps. Hydrodissection and hydrodelineation were carried out with BSS on a blunt cannula. The lens was removed in a stop and chop  technique and the remaining cortical material was removed with the irrigation-aspiration handpiece. The capsular bag was inflated with viscoelastic and the Technis [id number]  lens was placed in the capsular bag without complication. The remaining viscoelastic was removed from the eye with the irrigation-aspiration handpiece. The wounds were hydrated. The anterior chamber was flushed with Miostat and the eye was inflated to physiologic pressure. 0.561ml of Vigamox was placed in the anterior chamber. The wounds were found to be water tight. The eye was dressed with Vigamox. The patient was given protective glasses to wear throughout the day and a shield with  which to sleep tonight. The patient was also given drops with which to begin a drop regimen today and will follow-up with me in one day. Implant Name Type Inv. Item Serial No. Manufacturer Lot No. LRB No. Used  LENS IOL DIOP 20.0 - [id number] Intraocular Lens LENS IOL DIOP 20.0 [id number]  Right 1   Procedure(s) with comments: CATARACT EXTRACTION PHACO AND INTRAOCULAR LENS PLACEMENT (IOC) (Right) - US 01:03.2 AP% 17.1 CDE 10.83 Fluid Pack lot #[id number]  Electronically signed: Galen ManilaWilliam Jacklyn Branan 02/25/2018 9:55 AM

## 2018-02-25 NOTE — Anesthesia Postprocedure Evaluation (Signed)
Anesthesia Post Note  Patient: Martin Bean  Procedure(s) Performed: CATARACT EXTRACTION PHACO AND INTRAOCULAR LENS PLACEMENT (IOC) (Right Eye)  Patient location during evaluation: Short Stay Anesthesia Type: MAC Level of consciousness: awake and alert, oriented and patient cooperative Pain management: satisfactory to patient Vital Signs Assessment: post-procedure vital signs reviewed and stable Respiratory status: spontaneous breathing and respiratory function stable Cardiovascular status: blood pressure returned to baseline and stable Postop Assessment: no headache, no backache, patient able to bend at knees, no apparent nausea or vomiting, adequate PO intake and able to ambulate Anesthetic complications: no     Last Vitals:  Vitals:   02/25/18 0838 02/25/18 0957  BP: (!) 229/81 (!) 138/49  Pulse:  61  Resp:  16  Temp:  36.5 C  SpO2:  99%    Last Pain:  Vitals:   02/25/18 0957  TempSrc: Oral  PainSc:                  Martin Bean

## 2018-02-25 NOTE — Anesthesia Preprocedure Evaluation (Signed)
Anesthesia Evaluation  Patient identified by MRN, date of birth, ID band Patient awake    Reviewed: Allergy & Precautions, NPO status , Patient's Chart, lab work & pertinent test results  History of Anesthesia Complications (+) AWARENESS UNDER ANESTHESIA and history of anesthetic complications  Airway Mallampati: III  TM Distance: >3 FB Neck ROM: Full    Dental  (+) Edentulous Upper, Edentulous Lower   Pulmonary neg sleep apnea, neg COPD, former smoker,    breath sounds clear to auscultation- rhonchi (-) wheezing      Cardiovascular hypertension, Pt. on medications (-) CAD, (-) Past MI, (-) Cardiac Stents and (-) CABG  Rhythm:Regular Rate:Normal - Systolic murmurs and - Diastolic murmurs    Neuro/Psych CVA, No Residual Symptoms negative psych ROS   GI/Hepatic negative GI ROS, Neg liver ROS,   Endo/Other  negative endocrine ROSneg diabetes  Renal/GU negative Renal ROS     Musculoskeletal negative musculoskeletal ROS (+)   Abdominal (+) - obese,   Peds  Hematology negative hematology ROS (+)   Anesthesia Other Findings Past Medical History: No date: Family history of adverse reaction to anesthesia No date: HTN (hypertension) No date: Pneumonia No date: Prostate enlargement No date: Psoriasis   Reproductive/Obstetrics                             Anesthesia Physical  Anesthesia Plan  ASA: III  Anesthesia Plan: MAC   Post-op Pain Management:    Induction: Intravenous  PONV Risk Score and Plan:   Airway Management Planned: Nasal Cannula  Additional Equipment:   Intra-op Plan:   Post-operative Plan:   Informed Consent: I have reviewed the patients History and Physical, chart, labs and discussed the procedure including the risks, benefits and alternatives for the proposed anesthesia with the patient or authorized representative who has indicated his/her understanding and  acceptance.   Dental advisory given  Plan Discussed with: CRNA and Anesthesiologist  Anesthesia Plan Comments:         Anesthesia Quick Evaluation

## 2018-03-11 ENCOUNTER — Encounter: Payer: Self-pay | Admitting: *Deleted

## 2018-03-14 ENCOUNTER — Ambulatory Visit (INDEPENDENT_AMBULATORY_CARE_PROVIDER_SITE_OTHER): Payer: Medicare PPO

## 2018-03-14 ENCOUNTER — Encounter (INDEPENDENT_AMBULATORY_CARE_PROVIDER_SITE_OTHER): Payer: Self-pay | Admitting: Vascular Surgery

## 2018-03-14 ENCOUNTER — Ambulatory Visit (INDEPENDENT_AMBULATORY_CARE_PROVIDER_SITE_OTHER): Payer: Medicare PPO | Admitting: Vascular Surgery

## 2018-03-14 VITALS — BP 210/73 | HR 55 | Resp 14 | Ht 71.0 in | Wt 145.0 lb

## 2018-03-14 DIAGNOSIS — E785 Hyperlipidemia, unspecified: Secondary | ICD-10-CM | POA: Diagnosis not present

## 2018-03-14 DIAGNOSIS — I10 Essential (primary) hypertension: Secondary | ICD-10-CM | POA: Diagnosis not present

## 2018-03-14 DIAGNOSIS — I6522 Occlusion and stenosis of left carotid artery: Secondary | ICD-10-CM

## 2018-03-14 NOTE — Assessment & Plan Note (Signed)
blood pressure control important in reducing the progression of atherosclerotic disease. On appropriate oral medications.  

## 2018-03-14 NOTE — Progress Notes (Signed)
MRN : [id number]  Martin Bean is a 84 y.o. (August 12, 1930) male who presents with chief complaint of  Chief Complaint  Patient presents with  . Follow-up    carotid ultrsound  .  History of Present Illness: Patient returns in follow-up of his carotid disease.  He is about 5 months status post left carotid endarterectomy for high-grade stenosis.  He is doing well.  He denies any focal neurologic symptoms since his last visit. Specifically, the patient denies amaurosis fugax, speech or swallowing difficulties, or arm or leg weakness or numbness.  His carotid duplex today shows stable, mild right carotid artery stenosis in the 1-39% range and a widely patent left carotid endarterectomy.  Current Outpatient Medications  Medication Sig Dispense Refill  . aspirin EC 81 MG tablet Take 81 mg by mouth daily.     Marland Kitchen atorvastatin (LIPITOR) 40 MG tablet Take 1 tablet (40 mg total) by mouth daily at 6 PM. 30 tablet 0  . carvedilol (COREG) 3.125 MG tablet Take 1 tablet (3.125 mg total) by mouth 2 (two) times daily. 60 tablet 11  . cyanocobalamin (,VITAMIN B-12,) 1000 MCG/ML injection Inject into the muscle.    . feeding supplement, ENSURE ENLIVE, (ENSURE ENLIVE) LIQD Take 237 mLs by mouth 2 (two) times daily between meals. 237 mL 12  . finasteride (PROSCAR) 5 MG tablet Take by mouth.    . sertraline (ZOLOFT) 50 MG tablet Take 50 mg by mouth at bedtime.     . tamsulosin (FLOMAX) 0.4 MG CAPS capsule Take 0.4 mg by mouth daily.     Marland Kitchen HYDROcodone-acetaminophen (NORCO/VICODIN) 5-325 MG tablet Take 1-2 tablets by mouth every 4 (four) hours as needed for moderate pain. (Patient not taking: Reported on 02/18/2018) 30 tablet 0   No current facility-administered medications for this visit.     Past Medical History:  Diagnosis Date  . Carotid arterial disease (HCC) 10/02/2017  . CVA (cerebral vascular accident) (HCC) 08/14/2017  . Depression   . Diastolic heart failure (HCC)   . Family history of adverse  reaction to anesthesia   . HOH (hard of hearing)   . HTN (hypertension)   . Hydropneumothorax 11/01/2017  . Hyperlipidemia 10/09/2017  . Malnutrition of moderate degree 11/02/2017  . Pneumonia   . Prostate enlargement   . Psoriasis     Past Surgical History:  Procedure Laterality Date  . CATARACT EXTRACTION W/PHACO Right 02/25/2018   Procedure: CATARACT EXTRACTION PHACO AND INTRAOCULAR LENS PLACEMENT (IOC);  Surgeon: Galen Manila, MD;  Location: [facility] ORS;  Service: Ophthalmology;  Laterality: Right;  [place] 01:03.2 AP% 17.1 CDE 10.83 Fluid Pack lot #[id number]  . ENDARTERECTOMY Left 10/02/2017   Procedure: ENDARTERECTOMY CAROTID;  Surgeon: Annice Needy, MD;  Location: [facility] ORS;  Service: Vascular;  Laterality: Left;  . HERNIA REPAIR      Social History Social History   Tobacco Use  . Smoking status: Former Smoker    Types: Cigarettes    Last attempt to quit: 09/24/2013    Years since quitting: 4.4  . Smokeless tobacco: Never Used  Substance Use Topics  . Alcohol use: No    Frequency: Never  . Drug use: No    Family History Family History  Problem Relation Age of Onset  . Stroke Mother     No Known Allergies   REVIEW OF SYSTEMS (Negative unless checked)  Constitutional: [] Weight loss  [] Fever  [] Chills Cardiac: [] Chest pain   [] Chest pressure   [] Palpitations   [] Shortness  of breath when laying flat   [] Shortness of breath at rest   [] Shortness of breath with exertion. Vascular:  [] Pain in legs with walking   [] Pain in legs at rest   [] Pain in legs when laying flat   [] Claudication   [] Pain in feet when walking  [] Pain in feet at rest  [] Pain in feet when laying flat   [] History of DVT   [] Phlebitis   [] Swelling in legs   [] Varicose veins   [] Non-healing ulcers Pulmonary:   [] Uses home oxygen   [] Productive cough   [] Hemoptysis   [] Wheeze  [] COPD   [] Asthma Neurologic:  [] Dizziness  [] Blackouts   [] Seizures   [x] History of stroke   [] History of TIA  [] Aphasia   [] Temporary  blindness   [] Dysphagia   [] Weakness or numbness in arms   [] Weakness or numbness in legs Musculoskeletal:  [x] Arthritis   [] Joint swelling   [] Joint pain   [] Low back pain Hematologic:  [] Easy bruising  [] Easy bleeding   [] Hypercoagulable state   [] Anemic  [] Hepatitis Gastrointestinal:  [] Blood in stool   [] Vomiting blood  [] Gastroesophageal reflux/heartburn   [] Difficulty swallowing. Genitourinary:  [] Chronic kidney disease   [] Difficult urination  [] Frequent urination  [] Burning with urination   [] Blood in urine Skin:  [x] Rashes   [] Ulcers   [] Wounds Psychological:  [] History of anxiety   []  History of major depression.  Physical Examination  Vitals:   03/14/18 1350 03/14/18 1351  BP: (!) 189/79 (!) 210/73  Pulse: (!) 55   Resp: 14   Weight: 145 lb (65.8 kg)   Height: 5\' 11"  (1.803 m)    Body mass index is 20.22 kg/m. Gen: Thin elderly male, NAD Head: [place]/AT, No temporalis wasting. Ear/Nose/Throat: Hearing decreased, nares w/o erythema or drainage, trachea midline Eyes: Conjunctiva clear. Sclera non-icteric Neck: Supple.  no bruit  Pulmonary:  Good air movement, equal and clear to auscultation bilaterally.  Cardiac: RRR, No JVD Vascular:  Vessel Right Left  Radial Palpable Palpable                                    Musculoskeletal: M/S 5/5 throughout.  No deformity or atrophy.  Neurologic: CN 2-12 intact. Sensation grossly intact in extremities.  Symmetrical.  Speech is fluent. Motor exam as listed above. Psychiatric: Judgment intact, Mood & affect appropriate for pt's clinical situation. Dermatologic: No rashes or ulcers noted.  No cellulitis or open wounds.      CBC Lab Results  Component Value Date   WBC 13.8 (H) 11/06/2017   HGB 8.2 (L) 11/06/2017   HCT 24.8 (L) 11/06/2017   MCV 89.3 11/06/2017   PLT 235 11/06/2017    BMET    Component Value Date/Time   NA 133 (L) 11/04/2017 0443   K 4.5 11/04/2017 0443   CL 98 (L) 11/04/2017 0443   CO2 27  11/04/2017 0443   GLUCOSE 196 (H) 11/04/2017 0443   BUN 34 (H) 11/04/2017 0443   CREATININE 1.01 11/04/2017 0443   CREATININE 0.84 01/02/2013 0923   CALCIUM 8.8 (L) 11/04/2017 0443   GFRNONAA >60 11/04/2017 0443   GFRNONAA >60 01/02/2013 0923   GFRAA >60 11/04/2017 0443   GFRAA >60 01/02/2013 0923   CrCl cannot be calculated (Patient's most recent lab result is older than the maximum 21 days allowed.).  COAG Lab Results  Component Value Date   INR 1.24 11/02/2017   INR 1.20  11/01/2017   INR 1.09 09/24/2017    Radiology No results found.    Assessment/Plan Hyperlipidemia lipid control important in reducing the progression of atherosclerotic disease. Continue statin therapy   Essential hypertension blood pressure control important in reducing the progression of atherosclerotic disease. On appropriate oral medications.   Carotid arterial disease (HCC) His carotid duplex today shows stable, mild right carotid artery stenosis in the 1-39% range and a widely patent left carotid endarterectomy. He is doing well.  He will continue his aspirin and Lipitor.  We can now follow him on an annual basis with duplex    Festus BarrenJason Rusty Glodowski, MD  03/14/2018 2:18 PM    This note was created with Dragon medical transcription system.  Any errors from dictation are purely unintentional

## 2018-03-14 NOTE — Assessment & Plan Note (Signed)
His carotid duplex today shows stable, mild right carotid artery stenosis in the 1-39% range and a widely patent left carotid endarterectomy. He is doing well.  He will continue his aspirin and Lipitor.  We can now follow him on an annual basis with duplex

## 2018-03-14 NOTE — Patient Instructions (Signed)
Carotid Artery Disease The carotid arteries are arteries on both sides of the neck. They carry blood to the brain. Carotid artery disease is when the arteries get smaller (narrow) or get blocked. If these arteries get smaller or get blocked, you are more likely to have a stroke or warning stroke (transient ischemic attack). Follow these instructions at home:  Take medicines as told by your doctor. Make sure you understand all your medicine instructions. Do not stop your medicines without talking to your doctor first.  Follow your doctor's diet instructions. It is important to eat a healthy diet that includes plenty of: ? Fresh fruits. ? Vegetables. ? Lean meats.  Avoid: ? High-fat foods. ? High-sodium foods. ? Foods that are fried, overly processed, or have poor nutritional value.  Stay a healthy weight.  Stay active. Get at least 30 minutes of activity every day.  Do not smoke.  Limit alcohol use to: ? No more than 2 drinks a day for men. ? No more than 1 drink a day for women who are not pregnant.  Do not use illegal drugs.  Keep all doctor visits as told. Get help right away if:  You have sudden weakness or loss of feeling (numbness) on one side of the body, such as the face, arm, or leg.  You have sudden confusion.  You have trouble speaking (aphasia) or understanding.  You have sudden trouble seeing out of one or both eyes.  You have sudden trouble walking.  You have dizziness or feel like you might pass out (faint).  You have a loss of balance or your movements are not steady (uncoordinated).  You have a sudden, severe headache with no known cause.  You have trouble swallowing (dysphagia). Call your local emergency services (911 in [place] Do notdrive yourself to the clinic or hospital. This information is not intended to replace advice given to you by your health care provider. Make sure you discuss any questions you have with your health care  provider. Document Released: 07/23/2012 Document Revised: 01/12/2016 Document Reviewed: 02/04/2013 Elsevier Interactive Patient Education  2018 Elsevier Inc.  

## 2018-03-14 NOTE — Assessment & Plan Note (Signed)
lipid control important in reducing the progression of atherosclerotic disease. Continue statin therapy  

## 2018-03-18 ENCOUNTER — Other Ambulatory Visit: Payer: Self-pay

## 2018-03-18 ENCOUNTER — Encounter
Admission: RE | Disposition: A | Discharge status: Home / Self Care | Payer: Self-pay | Source: Ambulatory Visit | Attending: Ophthalmology

## 2018-03-18 ENCOUNTER — Ambulatory Visit: Payer: Medicare PPO | Admitting: Anesthesiology

## 2018-03-18 ENCOUNTER — Ambulatory Visit
Admission: RE | Admit: 2018-03-18 | Discharge: 2018-03-18 | Disposition: A | Discharge status: Home / Self Care | Payer: Medicare PPO | Source: Ambulatory Visit | Attending: Ophthalmology | Admitting: Ophthalmology

## 2018-03-18 ENCOUNTER — Encounter: Payer: Self-pay | Admitting: *Deleted

## 2018-03-18 DIAGNOSIS — Z7982 Long term (current) use of aspirin: Secondary | ICD-10-CM | POA: Diagnosis not present

## 2018-03-18 DIAGNOSIS — F329 Major depressive disorder, single episode, unspecified: Secondary | ICD-10-CM | POA: Insufficient documentation

## 2018-03-18 DIAGNOSIS — E7800 Pure hypercholesterolemia, unspecified: Secondary | ICD-10-CM | POA: Diagnosis not present

## 2018-03-18 DIAGNOSIS — I69328 Other speech and language deficits following cerebral infarction: Secondary | ICD-10-CM | POA: Diagnosis not present

## 2018-03-18 DIAGNOSIS — I739 Peripheral vascular disease, unspecified: Secondary | ICD-10-CM | POA: Insufficient documentation

## 2018-03-18 DIAGNOSIS — Z87891 Personal history of nicotine dependence: Secondary | ICD-10-CM | POA: Insufficient documentation

## 2018-03-18 DIAGNOSIS — H2512 Age-related nuclear cataract, left eye: Secondary | ICD-10-CM | POA: Insufficient documentation

## 2018-03-18 DIAGNOSIS — Z79899 Other long term (current) drug therapy: Secondary | ICD-10-CM | POA: Insufficient documentation

## 2018-03-18 DIAGNOSIS — I10 Essential (primary) hypertension: Secondary | ICD-10-CM | POA: Insufficient documentation

## 2018-03-18 HISTORY — PX: CATARACT EXTRACTION W/PHACO: SHX586

## 2018-03-18 SURGERY — PHACOEMULSIFICATION, CATARACT, WITH IOL INSERTION
Anesthesia: Monitor Anesthesia Care | Site: Eye | Laterality: Left | Wound class: Clean

## 2018-03-18 MED ORDER — TETRACAINE HCL 0.5 % OP SOLN
1.0000 [drp] | Freq: Once | OPHTHALMIC | Status: AC
  Administered 2018-03-18: 1 [drp] via OPHTHALMIC

## 2018-03-18 MED ORDER — SODIUM CHLORIDE 0.9 % IV SOLN
INTRAVENOUS | Status: DC
  Administered 2018-03-18: 11:00:00 via INTRAVENOUS

## 2018-03-18 MED ORDER — LIDOCAINE HCL (PF) 4 % IJ SOLN
INTRAOCULAR | Status: DC | PRN
  Administered 2018-03-18: 2 mL via OPHTHALMIC

## 2018-03-18 MED ORDER — PHENYLEPHRINE HCL 10 % OP SOLN
1.0000 [drp] | OPHTHALMIC | Status: AC
  Administered 2018-03-18 (×4): 1 [drp] via OPHTHALMIC

## 2018-03-18 MED ORDER — NA CHONDROIT SULF-NA HYALURON 40-17 MG/ML IO SOLN
INTRAOCULAR | Status: AC
  Filled 2018-03-18: qty 1

## 2018-03-18 MED ORDER — NA CHONDROIT SULF-NA HYALURON 40-17 MG/ML IO SOLN
INTRAOCULAR | Status: DC | PRN
  Administered 2018-03-18: 1 mL via INTRAOCULAR

## 2018-03-18 MED ORDER — EPINEPHRINE PF 1 MG/ML IJ SOLN
INTRAOCULAR | Status: DC | PRN
  Administered 2018-03-18: 1 mL via OPHTHALMIC

## 2018-03-18 MED ORDER — LIDOCAINE HCL (PF) 4 % IJ SOLN
INTRAMUSCULAR | Status: AC
  Filled 2018-03-18: qty 5

## 2018-03-18 MED ORDER — MOXIFLOXACIN HCL 0.5 % OP SOLN: 1.0000 [drp] | OPHTHALMIC | Status: DC | PRN

## 2018-03-18 MED ORDER — FENTANYL CITRATE (PF) 100 MCG/2ML IJ SOLN
INTRAMUSCULAR | Status: DC | PRN
  Administered 2018-03-18: 25 ug via INTRAVENOUS

## 2018-03-18 MED ORDER — CARBACHOL 0.01 % IO SOLN
INTRAOCULAR | Status: DC | PRN
  Administered 2018-03-18: .5 mL via INTRAOCULAR

## 2018-03-18 MED ORDER — PHENYLEPHRINE HCL 10 % OP SOLN
OPHTHALMIC | Status: AC
  Administered 2018-03-18: 1 [drp] via OPHTHALMIC
  Filled 2018-03-18: qty 5

## 2018-03-18 MED ORDER — HYDRALAZINE HCL 20 MG/ML IJ SOLN
INTRAMUSCULAR | Status: DC | PRN
  Administered 2018-03-18: 6 mg via INTRAVENOUS

## 2018-03-18 MED ORDER — POVIDONE-IODINE 5 % OP SOLN
OPHTHALMIC | Status: DC | PRN
  Administered 2018-03-18: 1 via OPHTHALMIC

## 2018-03-18 MED ORDER — ONDANSETRON HCL 4 MG/2ML IJ SOLN: 4.0000 mg | Freq: Once | INTRAMUSCULAR | Status: DC | PRN

## 2018-03-18 MED ORDER — CYCLOPENTOLATE HCL 2 % OP SOLN
1.0000 [drp] | OPHTHALMIC | Status: AC
  Administered 2018-03-18 (×4): 1 [drp] via OPHTHALMIC

## 2018-03-18 MED ORDER — MOXIFLOXACIN HCL 0.5 % OP SOLN
OPHTHALMIC | Status: DC | PRN
  Administered 2018-03-18: .2 mL via OPHTHALMIC

## 2018-03-18 MED ORDER — POVIDONE-IODINE 5 % OP SOLN
OPHTHALMIC | Status: AC
  Filled 2018-03-18: qty 30

## 2018-03-18 MED ORDER — TETRACAINE HCL 0.5 % OP SOLN
OPHTHALMIC | Status: AC
  Administered 2018-03-18: 1 [drp] via OPHTHALMIC
  Filled 2018-03-18: qty 4

## 2018-03-18 MED ORDER — MOXIFLOXACIN HCL 0.5 % OP SOLN
OPHTHALMIC | Status: AC
  Filled 2018-03-18: qty 3

## 2018-03-18 MED ORDER — CYCLOPENTOLATE HCL 2 % OP SOLN
OPHTHALMIC | Status: AC
  Administered 2018-03-18: 1 [drp] via OPHTHALMIC
  Filled 2018-03-18: qty 2

## 2018-03-18 MED ORDER — EPINEPHRINE PF 1 MG/ML IJ SOLN
INTRAMUSCULAR | Status: AC
  Filled 2018-03-18: qty 1

## 2018-03-18 SURGICAL SUPPLY — 16 items
GLOVE BIO SURGEON STRL SZ8 (GLOVE) ×3 IMPLANT
GLOVE BIOGEL M 6.5 STRL (GLOVE) ×3 IMPLANT
GLOVE SURG LX 8.0 MICRO (GLOVE) ×2
GLOVE SURG LX STRL 8.0 MICRO (GLOVE) ×1 IMPLANT
GOWN STRL REUS W/ TWL LRG LVL3 (GOWN DISPOSABLE) ×2 IMPLANT
GOWN STRL REUS W/TWL LRG LVL3 (GOWN DISPOSABLE) ×4
LABEL CATARACT MEDS ST (LABEL) ×3 IMPLANT
LENS IOL TECNIS ITEC 21.0 (Intraocular Lens) ×3 IMPLANT
PACK CATARACT (MISCELLANEOUS) ×3 IMPLANT
PACK CATARACT BRASINGTON LX (MISCELLANEOUS) ×3 IMPLANT
PACK EYE AFTER SURG (MISCELLANEOUS) ×3 IMPLANT
SOL BSS BAG (MISCELLANEOUS) ×3
SOLUTION BSS BAG (MISCELLANEOUS) ×1 IMPLANT
SYR 5ML LL (SYRINGE) ×3 IMPLANT
WATER STERILE IRR 250ML POUR (IV SOLUTION) ×3 IMPLANT
WIPE NON LINTING 3.25X3.25 (MISCELLANEOUS) ×3 IMPLANT

## 2018-03-18 NOTE — H&P (Signed)
All labs reviewed. Abnormal studies sent to patients PCP when indicated.  Previous H&P reviewed, patient examined, there are NO CHANGES.  Martin Corso Porfilio7/30/201911:27 AM

## 2018-03-18 NOTE — Anesthesia Preprocedure Evaluation (Signed)
Anesthesia Evaluation  Patient identified by MRN, date of birth, ID band Patient awake    Reviewed: Allergy & Precautions, NPO status , Patient's Chart, lab work & pertinent test results  History of Anesthesia Complications Negative for: history of anesthetic complications  Airway Mallampati: II       Dental  (+) Edentulous Upper, Edentulous Lower   Pulmonary neg sleep apnea, neg COPD, former smoker,           Cardiovascular hypertension, Pt. on medications and Pt. on home beta blockers + Peripheral Vascular Disease  (-) CHF (-) dysrhythmias (-) Valvular Problems/Murmurs     Neuro/Psych neg Seizures Depression CVA (speech difficulties)    GI/Hepatic Neg liver ROS, neg GERD  ,  Endo/Other  neg diabetes  Renal/GU negative Renal ROS     Musculoskeletal   Abdominal   Peds  Hematology   Anesthesia Other Findings   Reproductive/Obstetrics                             Anesthesia Physical Anesthesia Plan  ASA: III  Anesthesia Plan: MAC   Post-op Pain Management:    Induction: Intravenous  PONV Risk Score and Plan:   Airway Management Planned:   Additional Equipment:   Intra-op Plan:   Post-operative Plan:   Informed Consent: I have reviewed the patients History and Physical, chart, labs and discussed the procedure including the risks, benefits and alternatives for the proposed anesthesia with the patient or authorized representative who has indicated his/her understanding and acceptance.     Plan Discussed with:   Anesthesia Plan Comments:         Anesthesia Quick Evaluation

## 2018-03-18 NOTE — Discharge Instructions (Addendum)
Eye Surgery Discharge Instructions    Expect mild scratchy sensation or mild soreness. DO NOT RUB YOUR EYE!  The day of surgery:  Minimal physical activity, but bed rest is not required  No reading, computer work, or close hand work  No bending, lifting, or straining.  May watch TV  For 24 hours:  No driving, legal decisions, or alcoholic beverages  Safety precautions  Eat anything you prefer: It is better to start with liquids, then soup then solid foods.  Solar shield eyeglasses should be worn for comfort in the sunlight/patch while sleeping  Resume all regular medications including aspirin or Coumadin if these were discontinued prior to surgery. You may shower, bathe, shave, or wash your hair. Tylenol may be taken for mild discomfort. FOLLOW EYE DROP INSTRUCTION SHEET AS REVIEWED.  Call your doctor if you experience significant pain, nausea, or vomiting, fever > 101 or other signs of infection. [phone] or [phone] Specific instructions:  Follow-up Information    Galen ManilaPorfilio, William, MD Follow up.   Specialty:  Ophthalmology Why:  03/19/18 @ 10:20 am Contact information: [address] [place] [id number] [phone]

## 2018-03-18 NOTE — Anesthesia Postprocedure Evaluation (Signed)
Anesthesia Post Note  Patient: Martin Bean  Procedure(s) Performed: CATARACT EXTRACTION PHACO AND INTRAOCULAR LENS PLACEMENT ([place]) (Left Eye)  Patient location during evaluation: PACU Anesthesia Type: MAC Level of consciousness: awake and alert Pain management: pain level controlled Vital Signs Assessment: post-procedure vital signs reviewed and stable Anesthetic complications: no     Last Vitals:  Vitals:   03/18/18 1037 03/18/18 1153  BP: (!) 195/74 (!) 179/67  Pulse: (!) 58 (!) 57  Resp: 16 16  Temp: (!) 35.6 C (!) 36.2 C  SpO2: 96% 100%    Last Pain:  Vitals:   03/18/18 1153  TempSrc: Temporal  PainSc: 0-No pain                 Bernardo Heater

## 2018-03-18 NOTE — Transfer of Care (Signed)
Immediate Anesthesia Transfer of Care Note  Patient: Martin Bean  Procedure(s) Performed: CATARACT EXTRACTION PHACO AND INTRAOCULAR LENS PLACEMENT ([facility]) (Left Eye)  Patient Location: PACU  Anesthesia Type:MAC  Level of Consciousness: awake, alert , oriented and patient cooperative  Airway & Oxygen Therapy: Patient Spontanous Breathing  Post-op Assessment: Report given to RN and Post -op Vital signs reviewed and stable  Post vital signs: Reviewed and stable  Last Vitals:  Vitals Value Taken Time  BP    Temp 36.2 C 03/18/2018 11:53 AM  Pulse 57 03/18/2018 11:53 AM  Resp 16 03/18/2018 11:53 AM  SpO2 100 % 03/18/2018 11:53 AM    Last Pain:  Vitals:   03/18/18 1153  TempSrc: Temporal  PainSc: 0-No pain         Complications: No apparent anesthesia complications

## 2018-03-18 NOTE — Anesthesia Post-op Follow-up Note (Signed)
Anesthesia QCDR form completed.        

## 2018-03-18 NOTE — Op Note (Signed)
PREOPERATIVE DIAGNOSIS:  Nuclear sclerotic cataract of the left eye.   POSTOPERATIVE DIAGNOSIS:  Nuclear sclerotic cataract of the left eye.   OPERATIVE PROCEDURE: Procedure(s): CATARACT EXTRACTION PHACO AND INTRAOCULAR LENS PLACEMENT ([facility])   SURGEON:  Galen ManilaWilliam Yentl Verge, MD.   ANESTHESIA:  Anesthesiologist: Naomie DeanKephart, Connie Hilgert K, MD CRNA: Darrol JumpMarion, David, CRNA  1.      Managed anesthesia care. 2.     0.371ml of Shugarcaine was instilled following the paracentesis   COMPLICATIONS:  None.   TECHNIQUE:   Stop and chop   DESCRIPTION OF PROCEDURE:  The patient was examined and consented in the preoperative holding area where the aforementioned topical anesthesia was applied to the left eye and then brought back to the Operating Room where the left eye was prepped and draped in the usual sterile ophthalmic fashion and a lid speculum was placed. A paracentesis was created with the side port blade and the anterior chamber was filled with viscoelastic. A near clear corneal incision was performed with the steel keratome. A continuous curvilinear capsulorrhexis was performed with a cystotome followed by the capsulorrhexis forceps. Hydrodissection and hydrodelineation were carried out with BSS on a blunt cannula. The lens was removed in a stop and chop  technique and the remaining cortical material was removed with the irrigation-aspiration handpiece. The capsular bag was inflated with viscoelastic and the Technis [id number] lens was placed in the capsular bag without complication. The remaining viscoelastic was removed from the eye with the irrigation-aspiration handpiece. The wounds were hydrated. The anterior chamber was flushed with Miostat and the eye was inflated to physiologic pressure. 0.661ml Vigamox was placed in the anterior chamber. The wounds were found to be water tight. The eye was dressed with Vigamox. The patient was given protective glasses to wear throughout the day and a shield with which to sleep  tonight. The patient was also given drops with which to begin a drop regimen today and will follow-up with me in one day. Implant Name Type Inv. Item Serial No. Manufacturer Lot No. LRB No. Used  LENS IOL DIOP 21.0 - [id number] Intraocular Lens LENS IOL DIOP 21.0 [id number]  Left 1    Procedure(s) with comments: CATARACT EXTRACTION PHACO AND INTRAOCULAR LENS PLACEMENT ([facility]) (Left) - US 1.11 AP% 16.7 CDE 11.90 Fluid pack lot # [id number]  Electronically signed: Galen ManilaWilliam Willeen Novak 03/18/2018 11:52 AM

## 2018-07-14 ENCOUNTER — Other Ambulatory Visit: Payer: Self-pay | Admitting: Cardiothoracic Surgery

## 2018-07-14 ENCOUNTER — Ambulatory Visit
Admission: RE | Admit: 2018-07-14 | Discharge: 2018-07-14 | Disposition: A | Discharge status: Home / Self Care | Payer: Self-pay | Source: Ambulatory Visit | Attending: Cardiothoracic Surgery | Admitting: Cardiothoracic Surgery

## 2018-07-14 DIAGNOSIS — J939 Pneumothorax, unspecified: Secondary | ICD-10-CM

## 2018-12-28 ENCOUNTER — Inpatient Hospital Stay
Admission: EM | Admit: 2018-12-28 | Discharge: 2018-12-30 | DRG: 194 | Disposition: A | Discharge status: Home / Self Care | Payer: Medicare HMO | Attending: Family Medicine | Admitting: Family Medicine

## 2018-12-28 ENCOUNTER — Other Ambulatory Visit: Payer: Self-pay

## 2018-12-28 ENCOUNTER — Encounter: Payer: Self-pay | Admitting: Emergency Medicine

## 2018-12-28 ENCOUNTER — Emergency Department: Payer: Medicare HMO

## 2018-12-28 DIAGNOSIS — Z961 Presence of intraocular lens: Secondary | ICD-10-CM | POA: Diagnosis present

## 2018-12-28 DIAGNOSIS — J189 Pneumonia, unspecified organism: Principal | ICD-10-CM

## 2018-12-28 DIAGNOSIS — I5032 Chronic diastolic (congestive) heart failure: Secondary | ICD-10-CM | POA: Diagnosis present

## 2018-12-28 DIAGNOSIS — Z20828 Contact with and (suspected) exposure to other viral communicable diseases: Secondary | ICD-10-CM | POA: Diagnosis present

## 2018-12-28 DIAGNOSIS — F329 Major depressive disorder, single episode, unspecified: Secondary | ICD-10-CM | POA: Diagnosis present

## 2018-12-28 DIAGNOSIS — N179 Acute kidney failure, unspecified: Secondary | ICD-10-CM

## 2018-12-28 DIAGNOSIS — I2510 Atherosclerotic heart disease of native coronary artery without angina pectoris: Secondary | ICD-10-CM | POA: Diagnosis present

## 2018-12-28 DIAGNOSIS — N400 Enlarged prostate without lower urinary tract symptoms: Secondary | ICD-10-CM | POA: Diagnosis present

## 2018-12-28 DIAGNOSIS — Z9842 Cataract extraction status, left eye: Secondary | ICD-10-CM | POA: Diagnosis not present

## 2018-12-28 DIAGNOSIS — I951 Orthostatic hypotension: Secondary | ICD-10-CM | POA: Diagnosis present

## 2018-12-28 DIAGNOSIS — Z9841 Cataract extraction status, right eye: Secondary | ICD-10-CM | POA: Diagnosis not present

## 2018-12-28 DIAGNOSIS — E860 Dehydration: Secondary | ICD-10-CM | POA: Diagnosis present

## 2018-12-28 DIAGNOSIS — Z8673 Personal history of transient ischemic attack (TIA), and cerebral infarction without residual deficits: Secondary | ICD-10-CM

## 2018-12-28 DIAGNOSIS — H9190 Unspecified hearing loss, unspecified ear: Secondary | ICD-10-CM | POA: Diagnosis present

## 2018-12-28 DIAGNOSIS — Z87891 Personal history of nicotine dependence: Secondary | ICD-10-CM

## 2018-12-28 DIAGNOSIS — L409 Psoriasis, unspecified: Secondary | ICD-10-CM | POA: Diagnosis present

## 2018-12-28 DIAGNOSIS — W19XXXA Unspecified fall, initial encounter: Secondary | ICD-10-CM | POA: Diagnosis present

## 2018-12-28 DIAGNOSIS — Z79899 Other long term (current) drug therapy: Secondary | ICD-10-CM | POA: Diagnosis not present

## 2018-12-28 DIAGNOSIS — E785 Hyperlipidemia, unspecified: Secondary | ICD-10-CM | POA: Diagnosis present

## 2018-12-28 DIAGNOSIS — Z823 Family history of stroke: Secondary | ICD-10-CM | POA: Diagnosis not present

## 2018-12-28 DIAGNOSIS — Z7982 Long term (current) use of aspirin: Secondary | ICD-10-CM

## 2018-12-28 DIAGNOSIS — R0602 Shortness of breath: Secondary | ICD-10-CM | POA: Diagnosis present

## 2018-12-28 DIAGNOSIS — R627 Adult failure to thrive: Secondary | ICD-10-CM | POA: Diagnosis present

## 2018-12-28 DIAGNOSIS — I110 Hypertensive heart disease with heart failure: Secondary | ICD-10-CM | POA: Diagnosis present

## 2018-12-28 LAB — COMPREHENSIVE METABOLIC PANEL
ALT: 10 U/L (ref 0–44)
AST: 24 U/L (ref 15–41)
Albumin: 3.1 g/dL — ABNORMAL LOW (ref 3.5–5.0)
Alkaline Phosphatase: 94 U/L (ref 38–126)
Anion gap: 9 (ref 5–15)
BUN: 27 mg/dL — ABNORMAL HIGH (ref 8–23)
CO2: 26 mmol/L (ref 22–32)
Calcium: 8.6 mg/dL — ABNORMAL LOW (ref 8.9–10.3)
Chloride: 103 mmol/L (ref 98–111)
Creatinine, Ser: 1.36 mg/dL — ABNORMAL HIGH (ref 0.61–1.24)
GFR calc Af Amer: 54 mL/min — ABNORMAL LOW (ref >60)
GFR calc non Af Amer: 47 mL/min — ABNORMAL LOW (ref >60)
Glucose, Bld: 235 mg/dL — ABNORMAL HIGH (ref 70–99)
Potassium: 3.8 mmol/L (ref 3.5–5.1)
Sodium: 138 mmol/L (ref 135–145)
Total Bilirubin: 1.2 mg/dL (ref 0.3–1.2)
Total Protein: 7 g/dL (ref 6.5–8.1)

## 2018-12-28 LAB — CBC WITH DIFFERENTIAL/PLATELET
Abs Immature Granulocytes: 0.46 10*3/uL — ABNORMAL HIGH (ref 0.00–0.07)
Basophils Absolute: 0.1 10*3/uL (ref 0.0–0.1)
Basophils Relative: 0 %
Eosinophils Absolute: 0.1 10*3/uL (ref 0.0–0.5)
Eosinophils Relative: 0 %
HCT: 32.7 % — ABNORMAL LOW (ref 39.0–52.0)
Hemoglobin: 10.4 g/dL — ABNORMAL LOW (ref 13.0–17.0)
Immature Granulocytes: 2 %
Lymphocytes Relative: 2 %
Lymphs Abs: 0.4 10*3/uL — ABNORMAL LOW (ref 0.7–4.0)
MCH: 28.4 pg (ref 26.0–34.0)
MCHC: 31.8 g/dL (ref 30.0–36.0)
MCV: 89.3 fL (ref 80.0–100.0)
Monocytes Absolute: 1.1 10*3/uL — ABNORMAL HIGH (ref 0.1–1.0)
Monocytes Relative: 5 %
Neutro Abs: 19.2 10*3/uL — ABNORMAL HIGH (ref 1.7–7.7)
Neutrophils Relative %: 91 %
Platelets: 173 10*3/uL (ref 150–400)
RBC: 3.66 MIL/uL — ABNORMAL LOW (ref 4.22–5.81)
RDW: 16 % — ABNORMAL HIGH (ref 11.5–15.5)
WBC: 21.3 10*3/uL — ABNORMAL HIGH (ref 4.0–10.5)
nRBC: 0 % (ref 0.0–0.2)

## 2018-12-28 LAB — SARS CORONAVIRUS 2 BY RT PCR (HOSPITAL ORDER, PERFORMED IN ~~LOC~~ HOSPITAL LAB): SARS Coronavirus 2: NEGATIVE

## 2018-12-28 LAB — TROPONIN I: Troponin I: <0.03 ng/mL (ref <0.03)

## 2018-12-28 MED ORDER — ENSURE ENLIVE PO LIQD
237.0000 mL | Freq: Two times a day (BID) | ORAL | Status: DC
  Administered 2018-12-29: 237 mL via ORAL

## 2018-12-28 MED ORDER — ATORVASTATIN CALCIUM 20 MG PO TABS
40.0000 mg | ORAL_TABLET | Freq: Every day | ORAL | Status: DC
  Administered 2018-12-29: 40 mg via ORAL
  Filled 2018-12-28: qty 2

## 2018-12-28 MED ORDER — SERTRALINE HCL 50 MG PO TABS
50.0000 mg | ORAL_TABLET | Freq: Every day | ORAL | Status: DC
  Administered 2018-12-28 – 2018-12-29 (×2): 50 mg via ORAL
  Filled 2018-12-28 (×2): qty 1

## 2018-12-28 MED ORDER — HEPARIN SODIUM (PORCINE) 5000 UNIT/ML IJ SOLN
5000.0000 [IU] | Freq: Three times a day (TID) | INTRAMUSCULAR | Status: DC
  Administered 2018-12-28 – 2018-12-30 (×4): 5000 [IU] via SUBCUTANEOUS
  Filled 2018-12-28 (×4): qty 1

## 2018-12-28 MED ORDER — FINASTERIDE 5 MG PO TABS
5.0000 mg | ORAL_TABLET | Freq: Every day | ORAL | Status: DC
  Administered 2018-12-28 – 2018-12-30 (×3): 5 mg via ORAL
  Filled 2018-12-28 (×3): qty 1

## 2018-12-28 MED ORDER — ASPIRIN EC 81 MG PO TBEC
81.0000 mg | DELAYED_RELEASE_TABLET | Freq: Every day | ORAL | Status: DC
  Administered 2018-12-28 – 2018-12-30 (×3): 81 mg via ORAL
  Filled 2018-12-28 (×3): qty 1

## 2018-12-28 MED ORDER — LEVOFLOXACIN IN D5W 750 MG/150ML IV SOLN: 750.0000 mg | INTRAVENOUS | Status: DC

## 2018-12-28 MED ORDER — LEVOFLOXACIN IN D5W 750 MG/150ML IV SOLN
750.0000 mg | Freq: Once | INTRAVENOUS | Status: AC
  Administered 2018-12-28: 18:00:00 750 mg via INTRAVENOUS
  Filled 2018-12-28 (×2): qty 150

## 2018-12-28 MED ORDER — MENTHOL 3 MG MT LOZG
1.0000 | LOZENGE | OROMUCOSAL | Status: DC | PRN
  Filled 2018-12-28: qty 9

## 2018-12-28 MED ORDER — TAMSULOSIN HCL 0.4 MG PO CAPS
0.4000 mg | ORAL_CAPSULE | Freq: Every day | ORAL | Status: DC
  Administered 2018-12-28 – 2018-12-30 (×3): 0.4 mg via ORAL
  Filled 2018-12-28 (×3): qty 1

## 2018-12-28 MED ORDER — SODIUM CHLORIDE 0.9 % IV SOLN
INTRAVENOUS | Status: AC
  Administered 2018-12-28: 20:00:00 via INTRAVENOUS

## 2018-12-28 MED ORDER — DOCUSATE SODIUM 100 MG PO CAPS: 100.0000 mg | ORAL_CAPSULE | Freq: Two times a day (BID) | ORAL | Status: DC | PRN

## 2018-12-28 NOTE — ED Provider Notes (Signed)
[facility] Emergency Department Provider Note       Time seen: ----------------------------------------- 4:49 PM on 12/28/2018 -----------------------------------------   I have reviewed the triage vital signs and the nursing notes.  HISTORY   Chief Complaint Cough; Failure To Thrive; Fall; and Shortness of Breath   HPI Martin Bean is a 85 y.o. male with a history of coronary artery disease, CVA, heart failure, depression, hyperlipidemia, pneumonia who presents to the ED for productive cough.  Patient states he has productive cough with creamy colored sputum.  He also has some shortness of breath.  Recently had a fall last week and has had some back soreness.  He also has had some fatigue, dizziness and diarrhea.  He lives at his home alone.  Sats are 91% on room air.  Past Medical History:  Diagnosis Date  . Carotid arterial disease (HCC) 10/02/2017  . CVA (cerebral vascular accident) (HCC) 08/14/2017  . Depression   . Diastolic heart failure (HCC)   . Family history of adverse reaction to anesthesia   . HOH (hard of hearing)   . HTN (hypertension)   . Hydropneumothorax 11/01/2017  . Hyperlipidemia 10/09/2017  . Malnutrition of moderate degree 11/02/2017  . Pneumonia   . Prostate enlargement   . Psoriasis     Patient Active Problem List   Diagnosis Date Noted  . Hemothorax   . Malnutrition of moderate degree 11/02/2017  . Hydropneumothorax 11/01/2017  . Essential hypertension 10/09/2017  . Hyperlipidemia 10/09/2017  . Carotid arterial disease (HCC) 10/02/2017  . Carotid artery stenosis, unilateral, left 09/04/2017  . CVA (cerebral vascular accident) (HCC) 08/14/2017  . Benign prostatic hyperplasia with urinary frequency 06/03/2017  . Depression, prolonged 06/03/2017  . 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 Psoriasis, unspecified 06/03/2017    Past Surgical History:  Procedure Laterality Date  . CATARACT EXTRACTION W/PHACO Right 02/25/2018   Procedure: CATARACT EXTRACTION PHACO AND INTRAOCULAR LENS PLACEMENT ([facility]);  Surgeon: Galen Manila, MD;  Location: [facility] ORS;  Service: Ophthalmology;  Laterality: Right;  [place] 01:03.2 AP% 17.1 CDE 10.83 Fluid Pack lot #[id number]  . CATARACT EXTRACTION W/PHACO Left 03/18/2018   Procedure: CATARACT EXTRACTION PHACO AND INTRAOCULAR LENS PLACEMENT ([facility]);  Surgeon: Galen Manila, MD;  Location: [facility] ORS;  Service: Ophthalmology;  Laterality: Left;  [place] 1.11 AP% 16.7 CDE 11.90 Fluid pack lot # [id number]  . ENDARTERECTOMY Left 10/02/2017   Procedure: ENDARTERECTOMY CAROTID;  Surgeon: Annice Needy, MD;  Location: [facility] ORS;  Service: Vascular;  Laterality: Left;  . HERNIA REPAIR      Allergies Patient has no known allergies.  Social History Social History   Tobacco Use  . Smoking status: Former Smoker    Types: Cigarettes    Last attempt to quit: 09/24/2013    Years since quitting: 5.2  . Smokeless tobacco: Never Used  Substance Use Topics  . Alcohol use: No    Frequency: Never  . Drug use: No   Review of Systems Constitutional: Negative for fever. Cardiovascular: Negative for chest pain. Respiratory: Positive for cough, shortness of breath with sputum production Gastrointestinal: Negative for abdominal pain, vomiting and diarrhea. Musculoskeletal: Positive for back pain Skin: Negative for rash. Neurological: Positive for weakness  All systems negative/normal/unremarkable except as stated in the HPI  ____________________________________________   PHYSICAL EXAM:  VITAL SIGNS: ED Triage Vitals  Enc Vitals Group     BP 12/28/18 1559 (!) 106/53     Pulse Rate 12/28/18 1559 80     Resp 12/28/18 1559  20     Temp 12/28/18 1559 97.6 F (36.4 C)     Temp Source 12/28/18 1559 Oral     SpO2 12/28/18 1559 91 %     Weight 12/28/18 1558 150 lb (68 kg)     Height 12/28/18 1558 5\' 11"  (1.803 m)     Head Circumference --      Peak Flow --      Pain Score 12/28/18 1558 0     Pain  Loc --      Pain Edu? --      Excl. in GC? --    Constitutional: Alert and oriented. Well appearing and in no distress. Eyes: Conjunctivae are normal. Normal extraocular movements. ENT      Head: Normocephalic and atraumatic.      Nose: No congestion/rhinnorhea.      Mouth/Throat: Mucous membranes are moist.      Neck: No stridor. Cardiovascular: Normal rate, regular rhythm. No murmurs, rubs, or gallops. Respiratory: Normal respiratory effort without tachypnea nor retractions.  Scattered rhonchi are noted Gastrointestinal: Soft and nontender. Normal bowel sounds Musculoskeletal: Nontender with normal range of motion in extremities. No lower extremity tenderness nor edema. Neurologic:  Normal speech and language. No gross focal neurologic deficits are appreciated.  Skin:  Skin is warm, dry and intact. No rash noted. Psychiatric: Mood and affect are normal. Speech and behavior are normal.  ____________________________________________  EKG: Interpreted by me.  Sinus rhythm the rate of 81 bpm, right bundle branch block, T wave abnormalities, normal QT  ____________________________________________  ED COURSE:  As part of my medical decision making, I reviewed the following data within the electronic MEDICAL RECORD NUMBER History obtained from family if available, nursing notes, old chart and ekg, as well as notes from prior ED visits. Patient presented for cough, we will assess with labs and imaging as indicated at this time.   Procedures  Martin Bean was evaluated in Emergency Department on 12/28/2018 for the symptoms described in the history of present illness. He was evaluated in the context of the global COVID-19 pandemic, which necessitated consideration that the patient might be at risk for infection with the SARS-CoV-2 virus that causes COVID-19. Institutional protocols and algorithms that pertain to the evaluation of patients at risk for COVID-19 are in a state of rapid change based  on information released by regulatory bodies including the CDC and federal and state organizations. These policies and algorithms were followed during the patient's care in the ED.  ____________________________________________   LABS (pertinent positives/negatives)  Labs Reviewed  CBC WITH DIFFERENTIAL/PLATELET - Abnormal; Notable for the following components:      Result Value   WBC 21.3 (*)    RBC 3.66 (*)    Hemoglobin 10.4 (*)    HCT 32.7 (*)    RDW 16.0 (*)    Neutro Abs 19.2 (*)    Lymphs Abs 0.4 (*)    Monocytes Absolute 1.1 (*)    Abs Immature Granulocytes 0.46 (*)    All other components within normal limits  COMPREHENSIVE METABOLIC PANEL - Abnormal; Notable for the following components:   Glucose, Bld 235 (*)    BUN 27 (*)    Creatinine, Ser 1.36 (*)    Calcium 8.6 (*)    Albumin 3.1 (*)    GFR calc non Af Amer 47 (*)    GFR calc Af Amer 54 (*)    All other components within normal limits  SARS CORONAVIRUS 2 (HOSPITAL ORDER, PERFORMED  IN [facility] LAB)  TROPONIN I  URINALYSIS, COMPLETE (UACMP) WITH MICROSCOPIC  CBG MONITORING, ED    RADIOLOGY Images were viewed by me  Chest x-ray Reveals a right lower lobe pneumonia ____________________________________________   DIFFERENTIAL DIAGNOSIS   Dehydration, electrolyte abnormality, community-acquired pneumonia, coronavirus, sepsis  FINAL ASSESSMENT AND PLAN  Community acquired pneumonia   Plan: The patient had presented for cough. Patient's labs did reveal significant leukocytosis and mild hyperglycemia. Patient's imaging revealed a patchy right basilar infiltrate.  I have started him on IV Levaquin and feel he would be best served with observation in the hospital.  He is weak and lives alone.  Oxygen and blood pressure are borderline at this time.   Ulice Dash, MD    Note: This note was generated in part or whole with voice recognition software. Voice recognition is usually quite accurate  but there are transcription errors that can and very often do occur. I apologize for any typographical errors that were not detected and corrected.     Emily Filbert, MD 12/28/18 Windy Fast

## 2018-12-28 NOTE — ED Notes (Signed)
ED TO INPATIENT HANDOFF REPORT  ED Nurse Name and Phone #: Victorino Dike [phone]  S Name/Age/Gender Martin Bean 85 y.o. male Room/Bed: ED30A/ED30A  Code Status   Code Status: Prior  Home/SNF/Other Home Patient oriented to: self, place, time and situation Is this baseline? Yes   Triage Complete: Triage complete  Chief Complaint Failure To Thrive  Triage Note Pt presents to ED via POV with c/o cough, SOB, fall on Friday with back soreness, fatigue, dizziness, diarrhea. Pt's daughter reports that he lives at home alone, reports that on Friday he fell and c/o soreness to his lower back. Pt with noted cough in triage that is strong and congested. Pt reports feeling SOB, RA O2 sats 91%.    Allergies No Known Allergies  Level of Care/Admitting Diagnosis ED Disposition    ED Disposition Condition Comment   Admit  Hospital Area: [facility] [[id number]]  Level of Care: Med-Surg [16]  Covid Evaluation: N/A  Diagnosis: Pneumonia [227785]  Admitting Physician: Altamese Dilling [phone]  Attending Physician: Altamese Dilling [phone]  Estimated length of stay: past midnight tomorrow  Certification:: I certify this patient will need inpatient services for at least 2 midnights  PT Class (Do Not Modify): Inpatient [101]  PT Acc Code (Do Not Modify): Private [1]       B Medical/Surgery History Past Medical History:  Diagnosis Date  . Carotid arterial disease (HCC) 10/02/2017  . CVA (cerebral vascular accident) (HCC) 08/14/2017  . Depression   . Diastolic heart failure (HCC)   . Family history of adverse reaction to anesthesia   . HOH (hard of hearing)   . HTN (hypertension)   . Hydropneumothorax 11/01/2017  . Hyperlipidemia 10/09/2017  . Malnutrition of moderate degree 11/02/2017  . Pneumonia   . Prostate enlargement   . Psoriasis    Past Surgical History:  Procedure Laterality Date  . CATARACT EXTRACTION W/PHACO Right 02/25/2018   Procedure:  CATARACT EXTRACTION PHACO AND INTRAOCULAR LENS PLACEMENT (IOC);  Surgeon: Galen Manila, MD;  Location: [facility] ORS;  Service: Ophthalmology;  Laterality: Right;  [place] 01:03.2 AP% 17.1 CDE 10.83 Fluid Pack lot #[id number]  . CATARACT EXTRACTION W/PHACO Left 03/18/2018   Procedure: CATARACT EXTRACTION PHACO AND INTRAOCULAR LENS PLACEMENT (IOC);  Surgeon: Galen Manila, MD;  Location: [facility] ORS;  Service: Ophthalmology;  Laterality: Left;  [place] 1.11 AP% 16.7 CDE 11.90 Fluid pack lot # [id number]  . ENDARTERECTOMY Left 10/02/2017   Procedure: ENDARTERECTOMY CAROTID;  Surgeon: Annice Needy, MD;  Location: [facility] ORS;  Service: Vascular;  Laterality: Left;  . HERNIA REPAIR       A IV Location/Drains/Wounds Patient Lines/Drains/Airways Status   Active Line/Drains/Airways    Name:   Placement date:   Placement time:   Site:   Days:   Peripheral IV 11/01/17 Left Antecubital   11/01/17    0914    Antecubital   422   Peripheral IV 12/28/18 Left Antecubital   12/28/18    1641    Antecubital   less than 1   Airway   02/25/18    0938     306   Incision (Closed) 10/02/17 Neck Left   10/02/17    1115     452   Incision (Closed) 02/25/18 Eye Right   02/25/18    0803     306   Incision (Closed) 03/18/18 Eye Left   03/18/18    1133     285  Intake/Output Last 24 hours No intake or output data in the 24 hours ending 12/28/18 1827  Labs/Imaging Results for orders placed or performed during the hospital encounter of 12/28/18 (from the past 48 hour(s))  CBC with Differential     Status: Abnormal   Collection Time: 12/28/18  4:36 PM  Result Value Ref Range   WBC 21.3 (H) 4.0 - 10.5 K/uL   RBC 3.66 (L) 4.22 - 5.81 MIL/uL   Hemoglobin 10.4 (L) 13.0 - 17.0 g/dL   HCT 09.3 (L) 23.5 - 57.3 %   MCV 89.3 80.0 - 100.0 fL   MCH 28.4 26.0 - 34.0 pg   MCHC 31.8 30.0 - 36.0 g/dL   RDW 22.0 (H) 25.4 - 27.0 %   Platelets 173 150 - 400 K/uL   nRBC 0.0 0.0 - 0.2 %   Neutrophils Relative % 91 %   Neutro Abs  19.2 (H) 1.7 - 7.7 K/uL   Lymphocytes Relative 2 %   Lymphs Abs 0.4 (L) 0.7 - 4.0 K/uL   Monocytes Relative 5 %   Monocytes Absolute 1.1 (H) 0.1 - 1.0 K/uL   Eosinophils Relative 0 %   Eosinophils Absolute 0.1 0.0 - 0.5 K/uL   Basophils Relative 0 %   Basophils Absolute 0.1 0.0 - 0.1 K/uL   Immature Granulocytes 2 %   Abs Immature Granulocytes 0.46 (H) 0.00 - 0.07 K/uL    Comment: Performed at [facility], [address] [place], [place] [address]  Comprehensive metabolic panel     Status: Abnormal   Collection Time: 12/28/18  4:36 PM  Result Value Ref Range   Sodium 138 135 - 145 mmol/L   Potassium 3.8 3.5 - 5.1 mmol/L   Chloride 103 98 - 111 mmol/L   CO2 26 22 - 32 mmol/L   Glucose, Bld 235 (H) 70 - 99 mg/dL   BUN 27 (H) 8 - 23 mg/dL   Creatinine, Ser 2.83 (H) 0.61 - 1.24 mg/dL   Calcium 8.6 (L) 8.9 - 10.3 mg/dL   Total Protein 7.0 6.5 - 8.1 g/dL   Albumin 3.1 (L) 3.5 - 5.0 g/dL   AST 24 15 - 41 U/L   ALT 10 0 - 44 U/L   Alkaline Phosphatase 94 38 - 126 U/L   Total Bilirubin 1.2 0.3 - 1.2 mg/dL   GFR calc non Af Amer 47 (L) >60 mL/min   GFR calc Af Amer 54 (L) >60 mL/min   Anion gap 9 5 - 15    Comment: Performed at [facility] [address] [place], [place] [address]  Troponin I - ONCE - STAT     Status: None   Collection Time: 12/28/18  4:36 PM  Result Value Ref Range   Troponin I <0.03 <0.03 ng/mL    Comment: Performed at [facility], [address], [place], [place] [address]  SARS Coronavirus 2 (CEPHEID - Performed in [facility] hospital lab), Hosp Order     Status: None   Collection Time: 12/28/18  4:36 PM  Result Value Ref Range   SARS Coronavirus 2 NEGATIVE NEGATIVE    Comment: (NOTE) If result is NEGATIVE SARS-CoV-2 target nucleic acids are NOT DETECTED. The SARS-CoV-2 RNA is generally detectable in upper and lower  respiratory specimens during the acute phase of infection. The lowest  concentration of SARS-CoV-2 viral  copies this assay can detect is 250  copies / mL. A negative result does not preclude SARS-CoV-2 infection  and should  not be used as the sole basis for treatment or other  patient management decisions.  A negative result may occur with  improper specimen collection / handling, submission of specimen other  than nasopharyngeal swab, presence of viral mutation(s) within the  areas targeted by this assay, and inadequate number of viral copies  (<250 copies / mL). A negative result must be combined with clinical  observations, patient history, and epidemiological information. If result is POSITIVE SARS-CoV-2 target nucleic acids are DETECTED. The SARS-CoV-2 RNA is generally detectable in upper and lower  respiratory specimens dur ing the acute phase of infection.  Positive  results are indicative of active infection with SARS-CoV-2.  Clinical  correlation with patient history and other diagnostic information is  necessary to determine patient infection status.  Positive results do  not rule out bacterial infection or co-infection with other viruses. If result is PRESUMPTIVE POSTIVE SARS-CoV-2 nucleic acids MAY BE PRESENT.   A presumptive positive result was obtained on the submitted specimen  and confirmed on repeat testing.  While 2019 novel coronavirus  (SARS-CoV-2) nucleic acids may be present in the submitted sample  additional confirmatory testing may be necessary for epidemiological  and / or clinical management purposes  to differentiate between  SARS-CoV-2 and other Sarbecovirus currently known to infect humans.  If clinically indicated additional testing with an alternate test  methodology [phone]([id number]) is advised. The SARS-CoV-2 RNA is generally  detectable in upper and lower respiratory sp ecimens during the acute  phase of infection. The expected result is Negative. Fact Sheet for Patients:  BoilerBrush.com.cyhttps://www.fda.gov/media/136312/download Fact Sheet for Healthcare  Providers: https://pope.com/https://www.fda.gov/media/136313/download This test is not yet approved or cleared by the [place] FDA and has been authorized for detection and/or diagnosis of SARS-CoV-2 by FDA under an Emergency Use Authorization (EUA).  This EUA will remain in effect (meaning this test can be used) for the duration of the COVID-19 declaration under Section 564(b)(1) of the Act, 21 U.S.C. section 360bbb-3(b)(1), unless the authorization is terminated or revoked sooner. Performed at [facility], [address] [place], [place] [address]    Dg Chest 1 View  Result Date: 12/28/2018 CLINICAL DATA:  Cough and shortness of breath EXAM: CHEST  1 VIEW COMPARISON:  11/20/2017 FINDINGS: Cardiac shadow is mildly enlarged but stable. Aortic calcifications are seen. The lungs are well aerated bilaterally. Patchy right basilar infiltrate is seen. Resolution of previously seen right-sided effusion is noted. No bony abnormality is noted. IMPRESSION: Patchy right basilar infiltrate. Electronically Signed   By: Alcide CleverMark  Lukens M.D.   On: 12/28/2018 17:18    Pending Labs Unresulted Labs (From admission, onward)    Start     Ordered   12/28/18 1623  Urinalysis, Complete w Microscopic  (ALOC)  ONCE - STAT,   STAT     12/28/18 1622   Signed and Held  Basic metabolic panel  Tomorrow morning,   R     Signed and Held   Signed and Held  CBC  Tomorrow morning,   R     Signed and Held   Signed and Held  CBC  (heparin)  Once,   R    Comments:  Baseline for heparin therapy IF NOT ALREADY DRAWN.  Notify MD if PLT < 100 K.    Signed and Held   Signed and Held  Creatinine, serum  (heparin)  Once,   R    Comments:  Baseline for heparin therapy IF NOT ALREADY DRAWN.    Signed  and Held          Vitals/Pain Today's Vitals   12/28/18 1558 12/28/18 1559  BP:  (!) 106/53  Pulse:  80  Resp:  20  Temp:  97.6 F (36.4 C)  TempSrc:  Oral  SpO2:  91%  Weight: 68 kg   Height:  (1.803 m)    PainSc: 0-No pain     Isolation Precautions No active isolations  Medications Medications  levofloxacin (LEVAQUIN) IVPB 750 mg (750 mg Intravenous New Bag/Given 12/28/18 1746)  0.9 %  sodium chloride infusion (has no administration in time range)  menthol-cetylpyridinium (CEPACOL) lozenge 3 mg (has no administration in time range)    Mobility walks with person assist High fall risk   Focused Assessments Pulmonary Assessment Handoff:  Lung sounds: Bilateral Breath Sounds: Expiratory wheezes O2 Device: Room Air        R Recommendations: See Admitting Provider Note  Report given to:   Additional Notes:

## 2018-12-28 NOTE — Progress Notes (Signed)
Family Meeting Note  Advance Directive:yes  Today a meeting took place with the Patient and daughter.  The following clinical team members were present during this meeting:MD  The following were discussed:Patient's diagnosis:Pneumonia, Old age, ac renal failure , Patient's progosis: Unable to determine and Goals for treatment: Full Code  Additional follow-up to be provided: PMD  Time spent during discussion:20 minutes  Altamese Dilling, MD

## 2018-12-28 NOTE — ED Notes (Signed)
Transport to floor room 106.AS

## 2018-12-28 NOTE — H&P (Signed)
[facility] [place] [facility]   PATIENT NAME: Martin Bean Vallance    MR#:  [id number]  DATE OF BIRTH:  [facility]2/10/1929  DATE OF ADMISSION:  12/28/2018  PRIMARY CARE PHYSICIAN: [name], [name], MD   REQUESTING/REFERRING PHYSICIAN: [email or web address]  CHIEF COMPLAINT:   Chief Complaint  Patient presents with  . Cough  . Failure To Thrive  . Fall  . Shortness of Breath    HISTORY OF PRESENT ILLNESS: Martin Bean [place]  is a 85 y.o. male with a known history of cerebrovascular accident, depression, diastolic heart failure, hypertension, hydropneumothorax, hyperlipidemia, pneumonia, prostatic enlargement-lives alone but able to walk without any support.  For last 1 week he is feeling weak.  He also complained to daughter that he does not feel good.  He had a fall 2 to 3 days ago after feeling dizzy.  Yesterday he did not eat at all all day so finally family insisted him to go to emergency room.  They denies any complaint of fever or chills.  He is noted to have elevated white cell count and right lower lobe pneumonia on chest x-ray.  His COVID-19 test is negative.  ER physician suggested to admit to hospitalist team because of his age decreased oral intake slight dehydration and functional decline.  PAST MEDICAL HISTORY:   Past Medical History:  Diagnosis Date  . Carotid arterial disease (HCC) 10/02/2017  . CVA (cerebral vascular accident) (HCC) 08/14/2017  . Depression   . Diastolic heart failure (HCC)   . Family history of adverse reaction to anesthesia   . HOH (hard of hearing)   . HTN (hypertension)   . Hydropneumothorax 11/01/2017  . Hyperlipidemia 10/09/2017  . Malnutrition of moderate degree 11/02/2017  . Pneumonia   . Prostate enlargement   . Psoriasis     PAST SURGICAL HISTORY:  Past Surgical History:  Procedure Laterality Date  . CATARACT EXTRACTION W/PHACO Right 02/25/2018   Procedure: CATARACT EXTRACTION PHACO AND INTRAOCULAR LENS PLACEMENT (IOC);  Surgeon: Galen ManilaPorfilio,  William, MD;  Location: [facility] ORS;  Service: Ophthalmology;  Laterality: Right;  US 01:03.2 AP% 17.1 CDE 10.83 Fluid Pack lot #[id number]  . CATARACT EXTRACTION W/PHACO Left 03/18/2018   Procedure: CATARACT EXTRACTION PHACO AND INTRAOCULAR LENS PLACEMENT (IOC);  Surgeon: Galen ManilaPorfilio, William, MD;  Location: [facility] ORS;  Service: Ophthalmology;  Laterality: Left;  US 1.11 AP% 16.7 CDE 11.90 Fluid pack lot # [id number]  . ENDARTERECTOMY Left 10/02/2017   Procedure: ENDARTERECTOMY CAROTID;  Surgeon: Annice Needyew, Jason S, MD;  Location: [facility] ORS;  Service: Vascular;  Laterality: Left;  . HERNIA REPAIR      SOCIAL HISTORY:  Social History   Tobacco Use  . Smoking status: Former Smoker    Types: Cigarettes    Last attempt to quit: 09/24/2013    Years since quitting: 5.2  . Smokeless tobacco: Never Used  Substance Use Topics  . Alcohol use: No    Frequency: Never    FAMILY HISTORY:  Family History  Problem Relation Age of Onset  . Stroke Mother     DRUG ALLERGIES: No Known Allergies  REVIEW OF SYSTEMS:   CONSTITUTIONAL: No fever, have fatigue or weakness.  EYES: No blurred or double vision.  EARS, NOSE, AND THROAT: No tinnitus or ear pain.  RESPIRATORY: No cough, shortness of breath, wheezing or hemoptysis.  CARDIOVASCULAR: No chest pain, orthopnea, edema.  GASTROINTESTINAL: No nausea, vomiting, diarrhea or abdominal pain.  GENITOURINARY: No dysuria, hematuria.  ENDOCRINE: No polyuria, nocturia,  HEMATOLOGY: No anemia, easy  bruising or bleeding SKIN: No rash or lesion. MUSCULOSKELETAL: No joint pain or arthritis.   NEUROLOGIC: No tingling, numbness, weakness.  PSYCHIATRY: No anxiety or depression.   MEDICATIONS AT HOME:  Prior to Admission medications   Medication Sig Start Date End Date Taking? Authorizing Provider  aspirin EC 81 MG tablet Take 81 mg by mouth daily.  08/16/17   [provider]  atorvastatin (LIPITOR) 40 MG tablet Take 1 tablet (40 mg total) by mouth daily at 6  PM. 08/15/17   Milagros Loll, MD  carvedilol (COREG) 3.125 MG tablet Take 1 tablet (3.125 mg total) by mouth 2 (two) times daily. Patient not taking: Reported on 03/18/2018 11/06/17 11/06/18  Adrian Saran, MD  cyanocobalamin (,VITAMIN B-12,) 1000 MCG/ML injection Inject into the muscle. 02/26/18   [provider]  feeding supplement, ENSURE ENLIVE, (ENSURE ENLIVE) LIQD Take 237 mLs by mouth 2 (two) times daily between meals. 11/06/17   Adrian Saran, MD  finasteride (PROSCAR) 5 MG tablet Take by mouth. 02/26/18 02/26/19  [provider]  HYDROcodone-acetaminophen (NORCO/VICODIN) 5-325 MG tablet Take 1-2 tablets by mouth every 4 (four) hours as needed for moderate pain. Patient not taking: Reported on 02/18/2018 11/06/17   Adrian Saran, MD  sertraline (ZOLOFT) 50 MG tablet Take 50 mg by mouth at bedtime.  08/02/17   [provider]  tamsulosin (FLOMAX) 0.4 MG CAPS capsule Take 0.4 mg by mouth daily.  06/03/17   [provider]      PHYSICAL EXAMINATION:   VITAL SIGNS: Blood pressure (!) 106/53, pulse 80, temperature 97.6 F (36.4 C), temperature source Oral, resp. rate 20, height 5\' 11"  (1.803 m), weight 68 kg, SpO2 91 %.  GENERAL:  85 y.o.-year-old patient lying in the bed with no acute distress.  EYES: Pupils equal, round, reactive to light and accommodation. No scleral icterus. Extraocular muscles intact.  HEENT: Head atraumatic, normocephalic. Oropharynx and nasopharynx clear.  NECK:  Supple, no jugular venous distention. No thyroid enlargement, no tenderness.  LUNGS: Normal breath sounds bilaterally, some decreased on right lower lobe, no wheezing, some crepitation. No use of accessory muscles of respiration.  CARDIOVASCULAR: S1, S2 normal. No murmurs, rubs, or gallops.  ABDOMEN: Soft, nontender, nondistended. Bowel sounds present. No organomegaly or mass.  EXTREMITIES: No pedal edema, cyanosis, or clubbing.  NEUROLOGIC: Cranial nerves II through XII are intact.  Muscle strength hydrogen 4.  /5 in all extremities. Sensation intact. Gait not checked.  PSYCHIATRIC: The patient is alert and oriented x 3.  SKIN: No obvious rash, lesion, or ulcer.   LABORATORY PANEL:   CBC Recent Labs  Lab 12/28/18 1636  WBC 21.3*  HGB 10.4*  HCT 32.7*  PLT 173  MCV 89.3  MCH 28.4  MCHC 31.8  RDW 16.0*  LYMPHSABS 0.4*  MONOABS 1.1*  EOSABS 0.1  BASOSABS 0.1   ------------------------------------------------------------------------------------------------------------------  Chemistries  Recent Labs  Lab 12/28/18 1636  NA 138  K 3.8  CL 103  CO2 26  GLUCOSE 235*  BUN 27*  CREATININE 1.36*  CALCIUM 8.6*  AST 24  ALT 10  ALKPHOS 94  BILITOT 1.2   ------------------------------------------------------------------------------------------------------------------ estimated creatinine clearance is 37.5 mL/min (A) (by C-G formula based on SCr of 1.36 mg/dL (H)). ------------------------------------------------------------------------------------------------------------------ No results for input(s): TSH, T4TOTAL, T3FREE, THYROIDAB in the last 72 hours.  Invalid input(s): FREET3   Coagulation profile No results for input(s): INR, PROTIME in the last 168 hours. ------------------------------------------------------------------------------------------------------------------- No results for input(s): DDIMER in the last 72 hours. -------------------------------------------------------------------------------------------------------------------  Cardiac Enzymes Recent Labs  Lab 12/28/18 1636  TROPONINI <0.03   ------------------------------------------------------------------------------------------------------------------ Invalid input(s): POCBNP  ---------------------------------------------------------------------------------------------------------------  Urinalysis    Component Value Date/Time   COLORURINE YELLOW (A) 08/14/2017 0730    APPEARANCEUR CLEAR (A) 08/14/2017 0730   LABSPEC 1.017 08/14/2017 0730   PHURINE 6.0 08/14/2017 0730   GLUCOSEU NEGATIVE 08/14/2017 0730   HGBUR NEGATIVE 08/14/2017 0730   BILIRUBINUR NEGATIVE 08/14/2017 0730   KETONESUR NEGATIVE 08/14/2017 0730   PROTEINUR NEGATIVE 08/14/2017 0730   NITRITE NEGATIVE 08/14/2017 0730   LEUKOCYTESUR NEGATIVE 08/14/2017 0730     RADIOLOGY: Dg Chest 1 View  Result Date: 12/28/2018 CLINICAL DATA:  Cough and shortness of breath EXAM: CHEST  1 VIEW COMPARISON:  11/20/2017 FINDINGS: Cardiac shadow is mildly enlarged but stable. Aortic calcifications are seen. The lungs are well aerated bilaterally. Patchy right basilar infiltrate is seen. Resolution of previously seen right-sided effusion is noted. No bony abnormality is noted. IMPRESSION: Patchy right basilar infiltrate. Electronically Signed   By: Alcide Clever M.D.   On: 12/28/2018 17:18    EKG: Orders placed or performed during the hospital encounter of 12/28/18  . EKG 12-Lead  . EKG 12-Lead    IMPRESSION AND PLAN:  * Pneumonia   IV levaquin, Incentive spirometer  * Ac renal failure   IV fluids, monitor.  * Dizziness and a fall\   Likely hypotensive due to PNA and dehydration   Check orthostatic vitals   IV fluids, PT eval  * BPH   Cont flomax  * depression   Cont sertraline  * Htn   As per daughter- HE DOES NOT TAKE ANY MEDS AT HOME FOR LAST ONE YEAR. Coreg stopped.  All the records are reviewed and case discussed with ED provider. Management plans discussed with the patient, family and they are in agreement.  CODE STATUS:Full. Code Status History    Date Active Date Inactive Code Status Order ID Comments User Context   11/01/2017 1225 11/06/2017 1702 Full Code [id number]  Ihor Austin, MD Inpatient   10/02/2017 1525 10/03/2017 1756 Full Code [id number]  Annice Needy, MD Inpatient   08/14/2017 0935 08/15/2017 2214 Full Code [id number]  Milagros Loll, MD ED    Advance Directive  Documentation     Most Recent Value  Type of Advance Directive  Healthcare Power of Attorney, Living will  Pre-existing out of facility DNR order (yellow form or pink MOST form)  -  "MOST" Form in Place?  -       TOTAL TIME TAKING CARE OF THIS PATIENT: 55 minutes.  Spoke to daughter on phone.  Altamese Dilling M.D on 12/28/2018   Between 7am to 6pm - Pager - [phone]  After 6pm go to [email or web address] - password [email or web address]  [facility]  Office  [phone]  CC: Primary care physician; Gracelyn Nurse, MD   Note: This dictation was prepared with Dragon dictation along with smaller phrase technology. Any transcriptional errors that result from this process are unintentional.

## 2018-12-28 NOTE — Consult Note (Signed)
Pharmacy Antibiotic Note  Martin Bean is a 85 y.o. male admitted on 12/28/2018 with pneumonia.  Pharmacy has been consulted for Levaquin dosing.  Plan: Patient received Levaquin 750mg  dose in the ED. Levaquin 750mg  IV Q 48 hrs will be ordered to begin on 5/12 due to patient's current CrCl of 37.5 ml/min.  Height: 5\' 11"  (180.3 cm) Weight: 150 lb (68 kg) IBW/kg (Calculated) : 75.3  Temp (24hrs), Avg:98.1 F (36.7 C), Min:97.6 F (36.4 C), Max:98.6 F (37 C)  Recent Labs  Lab 12/28/18 1636  WBC 21.3*  CREATININE 1.36*    Estimated Creatinine Clearance: 37.5 mL/min (A) (by C-G formula based on SCr of 1.36 mg/dL (H)).    No Known Allergies  Antimicrobials this admission: Levaquin 5/10 >>    Dose adjustments this admission:   Microbiology results: 5/10 UA: pending 5/10 COVID: negative  Thank you for allowing pharmacy to be a part of this patient's care.  Bettey Costa 12/28/2018 7:49 PM

## 2018-12-28 NOTE — ED Triage Notes (Signed)
Pt presents to ED via POV with c/o cough, SOB, fall on Friday with back soreness, fatigue, dizziness, diarrhea. Pt's daughter reports that he lives at home alone, reports that on Friday he fell and c/o soreness to his lower back. Pt with noted cough in triage that is strong and congested. Pt reports feeling SOB, RA O2 sats 91%.

## 2018-12-29 LAB — BASIC METABOLIC PANEL
Anion gap: 8 (ref 5–15)
BUN: 27 mg/dL — ABNORMAL HIGH (ref 8–23)
CO2: 24 mmol/L (ref 22–32)
Calcium: 8.2 mg/dL — ABNORMAL LOW (ref 8.9–10.3)
Chloride: 109 mmol/L (ref 98–111)
Creatinine, Ser: 0.98 mg/dL (ref 0.61–1.24)
GFR calc Af Amer: >60 mL/min (ref >60)
GFR calc non Af Amer: >60 mL/min (ref >60)
Glucose, Bld: 113 mg/dL — ABNORMAL HIGH (ref 70–99)
Potassium: 3.5 mmol/L (ref 3.5–5.1)
Sodium: 141 mmol/L (ref 135–145)

## 2018-12-29 LAB — CBC
HCT: 28.8 % — ABNORMAL LOW (ref 39.0–52.0)
Hemoglobin: 9.5 g/dL — ABNORMAL LOW (ref 13.0–17.0)
MCH: 28.4 pg (ref 26.0–34.0)
MCHC: 33 g/dL (ref 30.0–36.0)
MCV: 86.2 fL (ref 80.0–100.0)
Platelets: 173 10*3/uL (ref 150–400)
RBC: 3.34 MIL/uL — ABNORMAL LOW (ref 4.22–5.81)
RDW: 15.7 % — ABNORMAL HIGH (ref 11.5–15.5)
WBC: 18.7 10*3/uL — ABNORMAL HIGH (ref 4.0–10.5)
nRBC: 0 % (ref 0.0–0.2)

## 2018-12-29 MED ORDER — LEVOFLOXACIN 750 MG PO TABS
750.0000 mg | ORAL_TABLET | Freq: Every day | ORAL | Status: DC
  Administered 2018-12-29: 18:00:00 750 mg via ORAL
  Filled 2018-12-29: qty 1

## 2018-12-29 MED ORDER — CARVEDILOL 3.125 MG PO TABS
3.1250 mg | ORAL_TABLET | Freq: Two times a day (BID) | ORAL | Status: DC
  Administered 2018-12-29 – 2018-12-30 (×2): 3.125 mg via ORAL
  Filled 2018-12-29 (×2): qty 1

## 2018-12-29 NOTE — Evaluation (Signed)
Physical Therapy Evaluation Patient Details Name: Martin Bean MRN: [id number] DOB: [birth year] Today's Date: 12/29/2018   History of Present Illness  presented to ER secondary to progressive weakness and SOB, intermittent dizziness with fall 2-3 days prior to admission; admitted for management of PNA.  Covid testing negative at admission.  Clinical Impression  Upon evaluation, patient alert and oriented; follows commands and demonstrates good effort with all mobility tasks.  Bilat UE/LE strength and ROM grossly symmetrical and WFL; no focal weakness appreciated.  Demonstrates ability to complete bed mobility with mod indep; sit/stand, basic transfers and gait (30') with RW, cga/min assist.  Multiple attempts and use of bilat UEs required for sit/stand from basic surfaces, but with increased time, able to complete without significant physical assist from helper.  Fair walker management without significant buckling, LOB or safety management; do recommend continued use of RW for optimal safety, energy conservation at this time. Orthostatic assessment does reveal significant drop in BP measurements from sitting to standing (patient slightly symptomatic), but appears to recover/rebound with accommodation to position (see vitals flowsheet for details).  Sats >90% on RA at rest and with activity.  Will continue to monitor both BP and SaO2 with activity in subsequent sessions. Would benefit from skilled PT to address above deficits and promote optimal return to PLOF; Recommend transition to HHPT upon discharge from acute hospitalization.     Follow Up Recommendations Home health PT    Equipment Recommendations       Recommendations for Other Services       Precautions / Restrictions Precautions Precautions: Fall Restrictions Weight Bearing Restrictions: No      Mobility  Bed Mobility Overal bed mobility: Modified Independent                Transfers Overall transfer level: Needs  assistance Equipment used: Rolling walker (2 wheeled) Transfers: Sit to/from Stand Sit to Stand: Min guard         General transfer comment: multiple attempts, requires use of UEs to complete lift off from standard seating surface  Ambulation/Gait Ambulation/Gait assistance: Min guard Gait Distance (Feet): 30 Feet Assistive device: Rolling walker (2 wheeled)       General Gait Details: reciprocal stepping pattern, decreased step height/length; increased turning radius; mod reliance on RW for external support.  Self-limiting distance due to fatigue/weakness; sats >90% on RA throughout.  Stairs            Wheelchair Mobility    Modified Rankin (Stroke Patients Only)       Balance Overall balance assessment: Needs assistance Sitting-balance support: No upper extremity supported;Feet supported Sitting balance-Leahy Scale: Good     Standing balance support: Bilateral upper extremity supported Standing balance-Leahy Scale: Fair                               Pertinent Vitals/Pain Pain Assessment: No/denies pain    Home Living Family/patient expects to be discharged to:: Private residence Living Arrangements: Alone Available Help at Discharge: Family;Available PRN/intermittently(family provides check in and assist with meals, errands several times a week as needed) Type of Home: House Home Access: Stairs to enter Entrance Stairs-Rails: Right;Left;Can reach both Entrance Stairs-Number of Steps: 5 Home Layout: One level Home Equipment: Walker - 2 wheels;Cane - single point      Prior Function Level of Independence: Independent with assistive device(s)         Comments: Mod indep with SPC for ADLs,  household and limited community distances; recent use of RW due to weakness/fall.  + driving.  No home O2.     Hand Dominance        Extremity/Trunk Assessment   Upper Extremity Assessment Upper Extremity Assessment: Overall WFL for tasks  assessed(grossly at least 4-/5 throughout)    Lower Extremity Assessment Lower Extremity Assessment: Overall WFL for tasks assessed(grossly at least 4-/5 throughout)       Communication   Communication: No difficulties  Cognition Arousal/Alertness: Awake/alert Behavior During Therapy: WFL for tasks assessed/performed Overall Cognitive Status: Within Functional Limits for tasks assessed                                        General Comments      Exercises     Assessment/Plan    PT Assessment Patient needs continued PT services  PT Problem List Decreased strength;Decreased activity tolerance;Decreased balance;Decreased mobility;Cardiopulmonary status limiting activity       PT Treatment Interventions Gait training;Stair training;Functional mobility training;DME instruction;Therapeutic activities;Therapeutic exercise;Balance training;Cognitive remediation    PT Goals (Current goals can be found in the Care Plan section)  Acute Rehab PT Goals Patient Stated Goal: to return home PT Goal Formulation: With patient Time For Goal Achievement: 01/12/19 Potential to Achieve Goals: Good    Frequency Min 2X/week   Barriers to discharge        Co-evaluation               AM-PAC PT "6 Clicks" Mobility  Outcome Measure Help needed turning from your back to your side while in a flat bed without using bedrails?: None Help needed moving from lying on your back to sitting on the side of a flat bed without using bedrails?: None Help needed moving to and from a bed to a chair (including a wheelchair)?: A Little Help needed standing up from a chair using your arms (e.g., wheelchair or bedside chair)?: A Little Help needed to walk in hospital room?: A Little Help needed climbing 3-5 steps with a railing? : A Little 6 Click Score: 20    End of Session Equipment Utilized During Treatment: Gait belt Activity Tolerance: Patient tolerated treatment well Patient  left: in chair;with call bell/phone within reach;with chair alarm set Nurse Communication: Mobility status PT Visit Diagnosis: Muscle weakness (generalized) (M62.81);Difficulty in walking, not elsewhere classified (R26.2);History of falling (Z91.81)    Time: 1018-1050 PT Time Calculation (min) (ACUTE ONLY): 32 min   Charges:   PT Evaluation $PT Eval Moderate Complexity: 1 Mod          Preslee Regas H. Manson PasseyBrown, PT, DPT, NCS 12/29/18, 3:02 PM [phone]

## 2018-12-29 NOTE — Consult Note (Signed)
Pharmacy Antibiotic Note  EWELL DEFUSCO is a 85 y.o. male admitted on 12/28/2018 with pneumonia.  Pharmacy has been consulted for Levaquin dosing.  Plan: Patient received Levaquin IV 750mg  dose in the ED.  Levaquin 750mg  IV Q 48 hrs will be ordered to begin on 5/12 due to patient's current CrCl of 37.5 ml/min  Addendum - will dose Levaquin 750mg  q24h per improved estimated CrCl of 51.68mL/min  Will adjust dosing to po per protocol.   Height: 5\' 11"  (180.3 cm) Weight: 148 lb 5.9 oz (67.3 kg) IBW/kg (Calculated) : 75.3  Temp (24hrs), Avg:98.1 F (36.7 C), Min:97.6 F (36.4 C), Max:98.6 F (37 C)  Recent Labs  Lab 12/28/18 1636 12/29/18 0448  WBC 21.3* 18.7*  CREATININE 1.36* 0.98    Estimated Creatinine Clearance: 51.5 mL/min (by C-G formula based on SCr of 0.98 mg/dL).    No Known Allergies  Antimicrobials this admission:  Levaquin 5/10 >>    Dose adjustments this admission: Levaquin originally dosed q 48 now q24 vs improved CrCl outlined above  Microbiology results: 5/10 COVID: negative  Thank you for allowing pharmacy to be a part of this patient's care.  Albina Billet, PharmD, BCPS Clinical Pharmacist 12/29/2018 9:30 AM

## 2018-12-29 NOTE — Progress Notes (Signed)
[facility] - [place] [facility]   PATIENT NAME: Martin Bean    MR#:  [id number]  DATE OF BIRTH:  30-Jan-1930  SUBJECTIVE:  CHIEF COMPLAINT: Clinically feeling better shortness of breath and cough are better than yesterday  REVIEW OF SYSTEMS:  CONSTITUTIONAL: No fever, fatigue or weakness.  EYES: No blurred or double vision.  EARS, NOSE, AND THROAT: No tinnitus or ear pain.  RESPIRATORY: Improving cough, shortness of breath, denies wheezing or hemoptysis.  CARDIOVASCULAR: No chest pain, orthopnea, edema.  GASTROINTESTINAL: No nausea, vomiting, diarrhea or abdominal pain.  GENITOURINARY: No dysuria, hematuria.  ENDOCRINE: No polyuria, nocturia,  HEMATOLOGY: No anemia, easy bruising or bleeding SKIN: No rash or lesion. MUSCULOSKELETAL: No joint pain or arthritis.   NEUROLOGIC: No tingling, numbness, weakness.  PSYCHIATRY: No anxiety or depression.   DRUG ALLERGIES:  No Known Allergies  VITALS:  Blood pressure (!) 179/74, pulse 79, temperature 98 F (36.7 C), temperature source Oral, resp. rate 20, height 5\' 11"  (1.803 m), weight 67.3 kg, SpO2 90 %.  PHYSICAL EXAMINATION:  GENERAL:  85 y.o.-year-old patient lying in the bed with no acute distress.  EYES: Pupils equal, round, reactive to light and accommodation. No scleral icterus. Extraocular muscles intact.  HEENT: Head atraumatic, normocephalic. Oropharynx and nasopharynx clear.  NECK:  Supple, no jugular venous distention. No thyroid enlargement, no tenderness.  LUNGS: Moderate breath sounds bilaterally, positive crackles, no wheezing, rales,rhonchi  No use of accessory muscles of respiration.  CARDIOVASCULAR: S1, S2 normal. No murmurs, rubs, or gallops.  ABDOMEN: Soft, nontender, nondistended. Bowel sounds present. No organomegaly or mass.  EXTREMITIES: No pedal edema, cyanosis, or clubbing.  NEUROLOGIC: Cranial nerves II through XII are intact.Sensation intact. Gait not checked.  PSYCHIATRIC:  The patient is alert and oriented x 3.  SKIN: No obvious rash, lesion, or ulcer.    LABORATORY PANEL:   CBC Recent Labs  Lab 12/29/18 0448  WBC 18.7*  HGB 9.5*  HCT 28.8*  PLT 173   ------------------------------------------------------------------------------------------------------------------  Chemistries  Recent Labs  Lab 12/28/18 1636 12/29/18 0448  NA 138 141  K 3.8 3.5  CL 103 109  CO2 26 24  GLUCOSE 235* 113*  BUN 27* 27*  CREATININE 1.36* 0.98  CALCIUM 8.6* 8.2*  AST 24  --   ALT 10  --   ALKPHOS 94  --   BILITOT 1.2  --    ------------------------------------------------------------------------------------------------------------------  Cardiac Enzymes Recent Labs  Lab 12/28/18 1636  TROPONINI <0.03   ------------------------------------------------------------------------------------------------------------------  RADIOLOGY:  Dg Chest 1 View  Result Date: 12/28/2018 CLINICAL DATA:  Cough and shortness of breath EXAM: CHEST  1 VIEW COMPARISON:  11/20/2017 FINDINGS: Cardiac shadow is mildly enlarged but stable. Aortic calcifications are seen. The lungs are well aerated bilaterally. Patchy right basilar infiltrate is seen. Resolution of previously seen right-sided effusion is noted. No bony abnormality is noted. IMPRESSION: Patchy right basilar infiltrate. Electronically Signed   By: Alcide Clever M.D.   On: 12/28/2018 17:18    EKG:   Orders placed or performed during the hospital encounter of 12/28/18  . EKG 12-Lead  . EKG 12-Lead    ASSESSMENT AND PLAN:   * Pneumonia Clinically feeling better.  Continue IV levofloxacin Breathing treatments as needed   , Incentive spirometer  * Ac renal failure Improved with IV fluids avoid nephrotoxins continue monitor     * Dizziness and a fall\-from orthostatic hypotension   Likely hypotensive due to PNA and dehydration   Patient is  orthostatic   IV fluids PT eval-recommending home health PT  *  BPH   Cont flomax  * depression   Cont sertraline  * Htn   As per daughter- HE DOES NOT TAKE ANY MEDS AT HOME FOR LAST ONE YEAR. Coreg stopped. Blood pressure is elevated resume Coreg     All the records are reviewed and case discussed with Care Management/Social Workerr. Management plans discussed with the patient, family and they are in agreement.  CODE STATUS: fc   TOTAL TIME TAKING CARE OF THIS PATIENT: 36 minutes.   POSSIBLE D/C IN 1-2 DAYS, DEPENDING ON CLINICAL CONDITION.  Note: This dictation was prepared with Dragon dictation along with smaller phrase technology. Any transcriptional errors that result from this process are unintentional.   Ramonita LabAruna Lydie Stammen M.D on 12/29/2018 at 3:28 PM  Between 7am to 6pm - Pager - [phone] After 6pm go to [email or web address] - password [email or web address] [facility]  [facility]  Office  [phone]  CC: Primary care physician; [name], [name], MD

## 2018-12-30 LAB — POTASSIUM: Potassium: 3.3 mmol/L — ABNORMAL LOW (ref 3.5–5.1)

## 2018-12-30 LAB — MAGNESIUM: Magnesium: 1.8 mg/dL (ref 1.7–2.4)

## 2018-12-30 MED ORDER — POTASSIUM CHLORIDE CRYS ER 20 MEQ PO TBCR: 40.0000 meq | EXTENDED_RELEASE_TABLET | Freq: Once | ORAL | Status: DC

## 2018-12-30 MED ORDER — POTASSIUM CHLORIDE CRYS ER 20 MEQ PO TBCR
40.0000 meq | EXTENDED_RELEASE_TABLET | Freq: Once | ORAL | Status: AC
  Administered 2018-12-30: 04:00:00 40 meq via ORAL
  Filled 2018-12-30: qty 2

## 2018-12-30 MED ORDER — AMLODIPINE BESYLATE 5 MG PO TABS
5.0000 mg | ORAL_TABLET | Freq: Every day | ORAL | Status: DC
  Administered 2018-12-30: 13:00:00 5 mg via ORAL
  Filled 2018-12-30: qty 1

## 2018-12-30 MED ORDER — GUAIFENESIN ER 600 MG PO TB12: 600.0000 mg | ORAL_TABLET | Freq: Two times a day (BID) | ORAL | 0 refills | Status: AC

## 2018-12-30 MED ORDER — HYDRALAZINE HCL 20 MG/ML IJ SOLN: 10.0000 mg | INTRAMUSCULAR | Status: DC | PRN

## 2018-12-30 MED ORDER — LEVOFLOXACIN 750 MG PO TABS: 750.0000 mg | ORAL_TABLET | Freq: Every day | ORAL | 0 refills | Status: DC

## 2018-12-30 MED ORDER — AMLODIPINE BESYLATE 5 MG PO TABS: 5.0000 mg | ORAL_TABLET | Freq: Every day | ORAL | 0 refills | Status: DC

## 2018-12-30 NOTE — TOC Progression Note (Signed)
Transition of Care [facility]) - Progression Note    Patient Details  Name: JENNINGS CORADO MRN: [id number] Date of Birth: 30-Nov-1929  Transition of Care [facility]) CM/SW Contact  Shade Flood, LCSW Phone Number: 12/30/2018, 1:40 PM  Clinical Narrative:     MD ordered [facility], RN, Aide and MSW for dc. Met with pt to discuss [facility] services and provided CMS choice list. Pt states that he does not want HH at this time. Reviewed the benefits of [facility] with pt who continues to refuse offer to set this up for him. Updated MD. Pt states he does not have any TOC needs for dc.       Expected Discharge Plan and Services           Expected Discharge Date: 12/30/18                                     Social Determinants of Health (SDOH) Interventions    Readmission Risk Interventions No flowsheet data found.

## 2018-12-30 NOTE — Progress Notes (Signed)
Patient discharged. Nedim Oki S, RN  

## 2018-12-30 NOTE — Progress Notes (Signed)
Physical Therapy Treatment Patient Details Name: Martin HaverJasper L Snuffer MRN: [id number] DOB: 11/17/1929 Today's Date: 12/30/2018    History of Present Illness 85 y/o male presented to ER secondary to progressive weakness and SOB, intermittent dizziness with fall 2-3 days prior to admission; admitted for management of PNA.  Covid testing negative at admission.    PT Comments    Pt is able to ambulate ~100 ft with confident ambulation using walker.  He was not overly reliant on it and was able to maintain consistent speed.  His biggest issues was mild shortness of breath, with O2 sats in the high 80s on room air at rest, mid 80s with activity, quickly returning to high 80s post ambulation.  Pt confident with children helping with errands, etc and is eager to get home.  Follow Up Recommendations  Home health PT     Equipment Recommendations  None recommended by PT    Recommendations for Other Services       Precautions / Restrictions Precautions Precautions: Fall Restrictions Weight Bearing Restrictions: No    Mobility  Bed Mobility Overal bed mobility: Modified Independent                Transfers Overall transfer level: Modified independent Equipment used: Rolling walker (2 wheeled) Transfers: Sit to/from Stand Sit to Stand: Supervision         General transfer comment: Pt was able to rise to standing w/o hesitation, using walker   Ambulation/Gait Ambulation/Gait assistance: Min guard Gait Distance (Feet): 100 Feet Assistive device: Rolling walker (2 wheeled)       General Gait Details: Pt with confident, consistent cadence, did have some fatigue.  O2 on room air high 80s at rest, down to mid 80s with amb, back to high 80s quicly post sitting   Stairs             Wheelchair Mobility    Modified Rankin (Stroke Patients Only)       Balance Overall balance assessment: Modified Independent   Sitting balance-Leahy Scale: Good     Standing balance  support: Bilateral upper extremity supported Standing balance-Leahy Scale: Fair                              Cognition Arousal/Alertness: Awake/alert Behavior During Therapy: WFL for tasks assessed/performed Overall Cognitive Status: Within Functional Limits for tasks assessed                                        Exercises      General Comments        Pertinent Vitals/Pain Pain Assessment: No/denies pain    Home Living                      Prior Function            PT Goals (current goals can now be found in the care plan section) Progress towards PT goals: Progressing toward goals    Frequency    Min 2X/week      PT Plan Current plan remains appropriate    Co-evaluation              AM-PAC PT "6 Clicks" Mobility   Outcome Measure  Help needed turning from your back to your side while in a flat bed without using bedrails?: None Help needed  moving from lying on your back to sitting on the side of a flat bed without using bedrails?: None Help needed moving to and from a bed to a chair (including a wheelchair)?: A Little Help needed standing up from a chair using your arms (e.g., wheelchair or bedside chair)?: A Little Help needed to walk in hospital room?: A Little Help needed climbing 3-5 steps with a railing? : A Little 6 Click Score: 20    End of Session Equipment Utilized During Treatment: Gait belt Activity Tolerance: Patient tolerated treatment well;Patient limited by fatigue Patient left: with bed alarm set;with call bell/phone within reach Nurse Communication: Mobility status PT Visit Diagnosis: Muscle weakness (generalized) (M62.81);Difficulty in walking, not elsewhere classified (R26.2);History of falling (Z91.81)     Time: 3818-4037 PT Time Calculation (min) (ACUTE ONLY): 15 min  Charges:  $Gait Training: 8-22 mins                     Malachi Pro, DPT 12/30/2018, 1:14 PM

## 2018-12-30 NOTE — Discharge Summary (Signed)
[facility] [place] at [facility]   PATIENT NAME: Martin Bean    MR#:  [id number]  DATE OF BIRTH:  31-Dec-1929  DATE OF ADMISSION:  12/28/2018 ADMITTING PHYSICIAN: Altamese Dilling, MD  DATE OF DISCHARGE: No discharge date for patient encounter.  PRIMARY CARE PHYSICIAN: Gracelyn Nurse, MD    ADMISSION DIAGNOSIS:  Community acquired pneumonia of right lower lobe of lung (HCC) [J18.1]  DISCHARGE DIAGNOSIS:  Principal Problem:   Community acquired pneumonia Active Problems:   Acute renal failure (ARF) (HCC)   Pneumonia   SECONDARY DIAGNOSIS:   Past Medical History:  Diagnosis Date  . Carotid arterial disease (HCC) 10/02/2017  . CVA (cerebral vascular accident) (HCC) 08/14/2017  . Depression   . Diastolic heart failure (HCC)   . Family history of adverse reaction to anesthesia   . HOH (hard of hearing)   . HTN (hypertension)   . Hydropneumothorax 11/01/2017  . Hyperlipidemia 10/09/2017  . Malnutrition of moderate degree 11/02/2017  . Pneumonia   . Prostate enlargement   . Psoriasis     HOSPITAL COURSE:  * Acute CAP Resolving  Treated on pneumonia protocol, provided empiric Levaquin, successfully weaned off oxygen, patient demanded to go home on day of discharge, patient follow-up primary care provider in 2 3 days for reevaluation   *AKI  Resolved with IV fluids for rehydration   *Acute dizziness w/fall Likely secondary to orthostasis Resolved with IV fluids for rehydration Physical therapy did see patient while in house-recommended home health PT status post discharge  *Chronic BPH Stable  * depression Stable  * Htn Patient typically does not take blood pressure medication at home over the last year or so, blood pressure typically uncontrolled Started on Coreg, Norvasc while in house, patient to follow-up with primary care provider status post discharge for reevaluation in 2 to 3 days  DISCHARGE CONDITIONS:    stable  CONSULTS OBTAINED:    DRUG ALLERGIES:  No Known Allergies  DISCHARGE MEDICATIONS:   Allergies as of 12/30/2018   No Known Allergies     Medication List    TAKE these medications   amLODipine 5 MG tablet Commonly known as:  NORVASC Take 1 tablet (5 mg total) by mouth daily.   aspirin EC 81 MG tablet Take 81 mg by mouth daily.   atorvastatin 40 MG tablet Commonly known as:  LIPITOR Take 1 tablet (40 mg total) by mouth daily at 6 PM.   carvedilol 3.125 MG tablet Commonly known as:  Coreg Take 1 tablet (3.125 mg total) by mouth 2 (two) times daily.   cyanocobalamin 1000 MCG/ML injection Commonly known as:  (VITAMIN B-12) Inject into the muscle.   feeding supplement (ENSURE ENLIVE) Liqd Take 237 mLs by mouth 2 (two) times daily between meals.   guaiFENesin 600 MG 12 hr tablet Commonly known as:  Mucinex Take 1 tablet (600 mg total) by mouth 2 (two) times daily.   HYDROcodone-acetaminophen 5-325 MG tablet Commonly known as:  NORCO/VICODIN Take 1-2 tablets by mouth every 4 (four) hours as needed for moderate pain.   levofloxacin 750 MG tablet Commonly known as:  LEVAQUIN Take 1 tablet (750 mg total) by mouth daily.   sertraline 100 MG tablet Commonly known as:  ZOLOFT Take 100 mg by mouth daily.   tamsulosin 0.4 MG Caps capsule Commonly known as:  FLOMAX Take 0.4 mg by mouth daily.        DISCHARGE INSTRUCTIONS:    If you experience worsening of  your admission symptoms, develop shortness of breath, life threatening emergency, suicidal or homicidal thoughts you must seek medical attention immediately by calling 911 or calling your MD immediately  if symptoms less severe.  You Must read complete instructions/literature along with all the possible adverse reactions/side effects for all the Medicines you take and that have been prescribed to you. Take any new Medicines after you have completely understood and accept all the possible adverse  reactions/side effects.   Please note  You were cared for by a hospitalist during your hospital stay. If you have any questions about your discharge medications or the care you received while you were in the hospital after you are discharged, you can call the unit and asked to speak with the hospitalist on call if the hospitalist that took care of you is not available. Once you are discharged, your primary care physician will handle any further medical issues. Please note that NO REFILLS for any discharge medications will be authorized once you are discharged, as it is imperative that you return to your primary care physician (or establish a relationship with a primary care physician if you do not have one) for your aftercare needs so that they can reassess your need for medications and monitor your lab values.    Today   CHIEF COMPLAINT:   Chief Complaint  Patient presents with  . Cough  . Failure To Thrive  . Fall  . Shortness of Breath    HISTORY OF PRESENT ILLNESS:    85 y.o. male with a known history of cerebrovascular accident, depression, diastolic heart failure, hypertension, hydropneumothorax, hyperlipidemia, pneumonia, prostatic enlargement-lives alone but able to walk without any support.  For last 1 week he is feeling weak.  He also complained to daughter that he does not feel good.  He had a fall 2 to 3 days ago after feeling dizzy.  Yesterday he did not eat at all all day so finally family insisted him to go to emergency room.  They denies any complaint of fever or chills.  He is noted to have elevated white cell count and right lower lobe pneumonia on chest x-ray.  His COVID-19 test is negative.  ER physician suggested to admit to hospitalist team because of his age decreased oral intake slight dehydration and functional decline.   VITAL SIGNS:  Blood pressure (!) 171/74, pulse 77, temperature 98 F (36.7 C), temperature source Oral, resp. rate 19, height  (1.803 m),  weight 67.3 kg, SpO2 90 %.  I/O:    Intake/Output Summary (Last 24 hours) at 12/30/2018 1144 Last data filed at 12/30/2018 4098 Gross per 24 hour  Intake 1277.66 ml  Output 2 ml  Net 1275.66 ml    PHYSICAL EXAMINATION:  GENERAL:  85 y.o.-year-old patient lying in the bed with no acute distress.  EYES: Pupils equal, round, reactive to light and accommodation. No scleral icterus. Extraocular muscles intact.  HEENT: Head atraumatic, normocephalic. Oropharynx and nasopharynx clear.  NECK:  Supple, no jugular venous distention. No thyroid enlargement, no tenderness.  LUNGS: Normal breath sounds bilaterally, no wheezing, rales,rhonchi or crepitation. No use of accessory muscles of respiration.  CARDIOVASCULAR: S1, S2 normal. No murmurs, rubs, or gallops.  ABDOMEN: Soft, non-tender, non-distended. Bowel sounds present. No organomegaly or mass.  EXTREMITIES: No pedal edema, cyanosis, or clubbing.  NEUROLOGIC: Cranial nerves II through XII are intact. Muscle strength 5/5 in all extremities. Sensation intact. Gait not checked.  PSYCHIATRIC: The patient is alert and oriented x 3.  SKIN: No obvious rash, lesion, or ulcer.   DATA REVIEW:   CBC Recent Labs  Lab 12/29/18 0448  WBC 18.7*  HGB 9.5*  HCT 28.8*  PLT 173    Chemistries  Recent Labs  Lab 12/28/18 1636 12/29/18 0448 12/30/18 0148  NA 138 141  --   K 3.8 3.5 3.3*  CL 103 109  --   CO2 26 24  --   GLUCOSE 235* 113*  --   BUN 27* 27*  --   CREATININE 1.36* 0.98  --   CALCIUM 8.6* 8.2*  --   MG  --   --  1.8  AST 24  --   --   ALT 10  --   --   ALKPHOS 94  --   --   BILITOT 1.2  --   --     Cardiac Enzymes Recent Labs  Lab 12/28/18 1636  TROPONINI <0.03    Microbiology Results  Results for orders placed or performed during the hospital encounter of 12/28/18  SARS Coronavirus 2 (CEPHEID - Performed in [facility] hospital lab), Hosp Order     Status: None   Collection Time: 12/28/18  4:36 PM  Result Value Ref  Range Status   SARS Coronavirus 2 NEGATIVE NEGATIVE Final    Comment: (NOTE) If result is NEGATIVE SARS-CoV-2 target nucleic acids are NOT DETECTED. The SARS-CoV-2 RNA is generally detectable in upper and lower  respiratory specimens during the acute phase of infection. The lowest  concentration of SARS-CoV-2 viral copies this assay can detect is 250  copies / mL. A negative result does not preclude SARS-CoV-2 infection  and should not be used as the sole basis for treatment or other  patient management decisions.  A negative result may occur with  improper specimen collection / handling, submission of specimen other  than nasopharyngeal swab, presence of viral mutation(s) within the  areas targeted by this assay, and inadequate number of viral copies  (<250 copies / mL). A negative result must be combined with clinical  observations, patient history, and epidemiological information. If result is POSITIVE SARS-CoV-2 target nucleic acids are DETECTED. The SARS-CoV-2 RNA is generally detectable in upper and lower  respiratory specimens dur ing the acute phase of infection.  Positive  results are indicative of active infection with SARS-CoV-2.  Clinical  correlation with patient history and other diagnostic information is  necessary to determine patient infection status.  Positive results do  not rule out bacterial infection or co-infection with other viruses. If result is PRESUMPTIVE POSTIVE SARS-CoV-2 nucleic acids MAY BE PRESENT.   A presumptive positive result was obtained on the submitted specimen  and confirmed on repeat testing.  While 2019 novel coronavirus  (SARS-CoV-2) nucleic acids may be present in the submitted sample  additional confirmatory testing may be necessary for epidemiological  and / or clinical management purposes  to differentiate between  SARS-CoV-2 and other Sarbecovirus currently known to infect humans.  If clinically indicated additional testing with an  alternate test  methodology [phone]([id number]) is advised. The SARS-CoV-2 RNA is generally  detectable in upper and lower respiratory sp ecimens during the acute  phase of infection. The expected result is Negative. Fact Sheet for Patients:  BoilerBrush.com.cyhttps://www.fda.gov/media/136312/download Fact Sheet for Healthcare Providers: https://pope.com/https://www.fda.gov/media/136313/download This test is not yet approved or cleared by the [place] FDA and has been authorized for detection and/or diagnosis of SARS-CoV-2 by FDA under an Emergency Use Authorization (EUA).  This EUA will remain in  effect (meaning this test can be used) for the duration of the COVID-19 declaration under Section 564(b)(1) of the Act, 21 U.S.C. section 360bbb-3(b)(1), unless the authorization is terminated or revoked sooner. Performed at [facility], [address] [place], [place] [address]     RADIOLOGY:  Dg Chest 1 View  Result Date: 12/28/2018 CLINICAL DATA:  Cough and shortness of breath EXAM: CHEST  1 VIEW COMPARISON:  11/20/2017 FINDINGS: Cardiac shadow is mildly enlarged but stable. Aortic calcifications are seen. The lungs are well aerated bilaterally. Patchy right basilar infiltrate is seen. Resolution of previously seen right-sided effusion is noted. No bony abnormality is noted. IMPRESSION: Patchy right basilar infiltrate. Electronically Signed   By: Alcide Clever M.D.   On: 12/28/2018 17:18    EKG:   Orders placed or performed during the hospital encounter of 12/28/18  . EKG 12-Lead  . EKG 12-Lead      Management plans discussed with the patient, family and they are in agreement.  CODE STATUS:     Code Status Orders  (From admission, onward)         Start     Ordered   12/28/18 1841  Full code  Continuous     12/28/18 1840        Code Status History    Date Active Date Inactive Code Status Order ID Comments User Context   11/01/2017 1225 11/06/2017 1702 Full Code [id number]  Ihor Austin, MD  Inpatient   10/02/2017 1525 10/03/2017 1756 Full Code [id number]  Annice Needy, MD Inpatient   08/14/2017 0935 08/15/2017 2214 Full Code [id number]  Milagros Loll, MD ED    Advance Directive Documentation     Most Recent Value  Type of Advance Directive  Healthcare Power of Attorney  Pre-existing out of facility DNR order (yellow form or pink MOST form)  -  "MOST" Form in Place?  -      TOTAL TIME TAKING CARE OF THIS PATIENT: 40 minutes.    Evelena Asa Sumire Halbleib M.D on 12/30/2018 at 11:44 AM  Between 7am to 6pm - Pager - [phone]  After 6pm go to [email or web address] - password [email or web address]  [facility]  Office  [phone]  CC: Primary care physician; Gracelyn Nurse, MD   Note: This dictation was prepared with Dragon dictation along with smaller phrase technology. Any transcriptional errors that result from this process are unintentional.

## 2019-01-17 ENCOUNTER — Encounter: Payer: Self-pay | Admitting: Emergency Medicine

## 2019-01-17 ENCOUNTER — Inpatient Hospital Stay
Admission: EM | Admit: 2019-01-17 | Discharge: 2019-01-20 | DRG: 194 | Disposition: A | Discharge status: Home Health | Payer: Medicare HMO | Attending: Internal Medicine | Admitting: Internal Medicine

## 2019-01-17 ENCOUNTER — Other Ambulatory Visit: Payer: Self-pay

## 2019-01-17 ENCOUNTER — Emergency Department: Payer: Medicare HMO

## 2019-01-17 DIAGNOSIS — Z8673 Personal history of transient ischemic attack (TIA), and cerebral infarction without residual deficits: Secondary | ICD-10-CM | POA: Diagnosis not present

## 2019-01-17 DIAGNOSIS — I5032 Chronic diastolic (congestive) heart failure: Secondary | ICD-10-CM | POA: Diagnosis present

## 2019-01-17 DIAGNOSIS — E785 Hyperlipidemia, unspecified: Secondary | ICD-10-CM | POA: Diagnosis present

## 2019-01-17 DIAGNOSIS — Z20828 Contact with and (suspected) exposure to other viral communicable diseases: Secondary | ICD-10-CM | POA: Diagnosis present

## 2019-01-17 DIAGNOSIS — I110 Hypertensive heart disease with heart failure: Secondary | ICD-10-CM | POA: Diagnosis present

## 2019-01-17 DIAGNOSIS — Z7982 Long term (current) use of aspirin: Secondary | ICD-10-CM

## 2019-01-17 DIAGNOSIS — R0902 Hypoxemia: Secondary | ICD-10-CM

## 2019-01-17 DIAGNOSIS — L409 Psoriasis, unspecified: Secondary | ICD-10-CM | POA: Diagnosis present

## 2019-01-17 DIAGNOSIS — J189 Pneumonia, unspecified organism: Principal | ICD-10-CM

## 2019-01-17 DIAGNOSIS — N400 Enlarged prostate without lower urinary tract symptoms: Secondary | ICD-10-CM | POA: Diagnosis present

## 2019-01-17 DIAGNOSIS — Z87891 Personal history of nicotine dependence: Secondary | ICD-10-CM

## 2019-01-17 DIAGNOSIS — Y95 Nosocomial condition: Secondary | ICD-10-CM | POA: Diagnosis present

## 2019-01-17 DIAGNOSIS — Z823 Family history of stroke: Secondary | ICD-10-CM | POA: Diagnosis not present

## 2019-01-17 DIAGNOSIS — R0602 Shortness of breath: Secondary | ICD-10-CM | POA: Diagnosis not present

## 2019-01-17 DIAGNOSIS — H9190 Unspecified hearing loss, unspecified ear: Secondary | ICD-10-CM | POA: Diagnosis present

## 2019-01-17 LAB — COMPREHENSIVE METABOLIC PANEL
ALT: 17 U/L (ref 0–44)
AST: 21 U/L (ref 15–41)
Albumin: 2.8 g/dL — ABNORMAL LOW (ref 3.5–5.0)
Alkaline Phosphatase: 143 U/L — ABNORMAL HIGH (ref 38–126)
Anion gap: 9 (ref 5–15)
BUN: 14 mg/dL (ref 8–23)
CO2: 23 mmol/L (ref 22–32)
Calcium: 8.1 mg/dL — ABNORMAL LOW (ref 8.9–10.3)
Chloride: 104 mmol/L (ref 98–111)
Creatinine, Ser: 0.81 mg/dL (ref 0.61–1.24)
GFR calc Af Amer: >60 mL/min (ref >60)
GFR calc non Af Amer: >60 mL/min (ref >60)
Glucose, Bld: 192 mg/dL — ABNORMAL HIGH (ref 70–99)
Potassium: 3.9 mmol/L (ref 3.5–5.1)
Sodium: 136 mmol/L (ref 135–145)
Total Bilirubin: 1 mg/dL (ref 0.3–1.2)
Total Protein: 6.7 g/dL (ref 6.5–8.1)

## 2019-01-17 LAB — CBC WITH DIFFERENTIAL/PLATELET
Abs Immature Granulocytes: 0.42 10*3/uL — ABNORMAL HIGH (ref 0.00–0.07)
Basophils Absolute: 0.1 10*3/uL (ref 0.0–0.1)
Basophils Relative: 0 %
Eosinophils Absolute: 0 10*3/uL (ref 0.0–0.5)
Eosinophils Relative: 0 %
HCT: 31.7 % — ABNORMAL LOW (ref 39.0–52.0)
Hemoglobin: 10 g/dL — ABNORMAL LOW (ref 13.0–17.0)
Immature Granulocytes: 2 %
Lymphocytes Relative: 3 %
Lymphs Abs: 0.6 10*3/uL — ABNORMAL LOW (ref 0.7–4.0)
MCH: 28 pg (ref 26.0–34.0)
MCHC: 31.5 g/dL (ref 30.0–36.0)
MCV: 88.8 fL (ref 80.0–100.0)
Monocytes Absolute: 1.3 10*3/uL — ABNORMAL HIGH (ref 0.1–1.0)
Monocytes Relative: 5 %
Neutro Abs: 21.7 10*3/uL — ABNORMAL HIGH (ref 1.7–7.7)
Neutrophils Relative %: 90 %
Platelets: 347 10*3/uL (ref 150–400)
RBC: 3.57 MIL/uL — ABNORMAL LOW (ref 4.22–5.81)
RDW: 17.3 % — ABNORMAL HIGH (ref 11.5–15.5)
WBC: 24.1 10*3/uL — ABNORMAL HIGH (ref 4.0–10.5)
nRBC: 0 % (ref 0.0–0.2)

## 2019-01-17 LAB — LACTIC ACID, PLASMA: Lactic Acid, Venous: 2 mmol/L (ref 0.5–1.9)

## 2019-01-17 LAB — TROPONIN I: Troponin I: <0.03 ng/mL (ref <0.03)

## 2019-01-17 LAB — PROTIME-INR
INR: 1.3 — ABNORMAL HIGH (ref 0.8–1.2)
Prothrombin Time: 16 seconds — ABNORMAL HIGH (ref 11.4–15.2)

## 2019-01-17 LAB — SARS CORONAVIRUS 2 BY RT PCR (HOSPITAL ORDER, PERFORMED IN ~~LOC~~ HOSPITAL LAB): SARS Coronavirus 2: NEGATIVE

## 2019-01-17 LAB — CULTURE, BLOOD (ROUTINE X 2)

## 2019-01-17 LAB — LIPASE, BLOOD: Lipase: 19 U/L (ref 11–51)

## 2019-01-17 MED ORDER — SERTRALINE HCL 50 MG PO TABS
100.0000 mg | ORAL_TABLET | Freq: Every day | ORAL | Status: DC
  Administered 2019-01-17 – 2019-01-20 (×4): 100 mg via ORAL
  Filled 2019-01-17 (×3): qty 2

## 2019-01-17 MED ORDER — ENOXAPARIN SODIUM 40 MG/0.4ML ~~LOC~~ SOLN
40.0000 mg | SUBCUTANEOUS | Status: DC
  Administered 2019-01-17 – 2019-01-19 (×3): 40 mg via SUBCUTANEOUS
  Filled 2019-01-17 (×2): qty 0.4

## 2019-01-17 MED ORDER — VANCOMYCIN HCL 1.25 G IV SOLR
1250.0000 mg | INTRAVENOUS | Status: DC
  Administered 2019-01-18: 1250 mg via INTRAVENOUS
  Filled 2019-01-17 (×2): qty 1250

## 2019-01-17 MED ORDER — VANCOMYCIN HCL 1.5 G IV SOLR
1500.0000 mg | Freq: Once | INTRAVENOUS | Status: AC
  Administered 2019-01-17: 18:00:00 1500 mg via INTRAVENOUS
  Filled 2019-01-17 (×3): qty 1500

## 2019-01-17 MED ORDER — VANCOMYCIN HCL IN DEXTROSE 1-5 GM/200ML-% IV SOLN: 1000.0000 mg | Freq: Once | INTRAVENOUS | Status: DC

## 2019-01-17 MED ORDER — HYDROCODONE-ACETAMINOPHEN 5-325 MG PO TABS: 1.0000 | ORAL_TABLET | ORAL | Status: DC | PRN

## 2019-01-17 MED ORDER — SODIUM CHLORIDE 0.9 % IV SOLN
2.0000 g | Freq: Three times a day (TID) | INTRAVENOUS | Status: DC
  Administered 2019-01-18 – 2019-01-20 (×7): 2 g via INTRAVENOUS
  Filled 2019-01-17 (×11): qty 2

## 2019-01-17 MED ORDER — TAMSULOSIN HCL 0.4 MG PO CAPS
0.4000 mg | ORAL_CAPSULE | Freq: Every day | ORAL | Status: DC
  Administered 2019-01-17 – 2019-01-20 (×4): 0.4 mg via ORAL
  Filled 2019-01-17 (×3): qty 1

## 2019-01-17 MED ORDER — SODIUM CHLORIDE 0.9 % IV SOLN
INTRAVENOUS | Status: DC
  Administered 2019-01-17 – 2019-01-19 (×3): via INTRAVENOUS

## 2019-01-17 MED ORDER — AMLODIPINE BESYLATE 5 MG PO TABS
5.0000 mg | ORAL_TABLET | Freq: Every day | ORAL | Status: DC
  Administered 2019-01-18 – 2019-01-20 (×3): 5 mg via ORAL
  Filled 2019-01-17 (×3): qty 1

## 2019-01-17 MED ORDER — ATORVASTATIN CALCIUM 20 MG PO TABS
40.0000 mg | ORAL_TABLET | Freq: Every day | ORAL | Status: DC
  Administered 2019-01-17 – 2019-01-19 (×3): 40 mg via ORAL
  Filled 2019-01-17 (×2): qty 2

## 2019-01-17 MED ORDER — SODIUM CHLORIDE 0.9 % IV SOLN
2.0000 g | Freq: Once | INTRAVENOUS | Status: AC
  Administered 2019-01-17: 2 g via INTRAVENOUS
  Filled 2019-01-17: qty 2

## 2019-01-17 MED ORDER — ASPIRIN EC 81 MG PO TBEC
81.0000 mg | DELAYED_RELEASE_TABLET | Freq: Every day | ORAL | Status: DC
  Administered 2019-01-18 – 2019-01-20 (×3): 81 mg via ORAL
  Filled 2019-01-17 (×3): qty 1

## 2019-01-17 MED ORDER — ENSURE ENLIVE PO LIQD
237.0000 mL | Freq: Two times a day (BID) | ORAL | Status: DC
  Administered 2019-01-18 – 2019-01-20 (×4): 237 mL via ORAL

## 2019-01-17 MED ORDER — GUAIFENESIN ER 600 MG PO TB12
600.0000 mg | ORAL_TABLET | Freq: Two times a day (BID) | ORAL | Status: DC
  Administered 2019-01-18 – 2019-01-20 (×5): 600 mg via ORAL
  Filled 2019-01-17 (×5): qty 1

## 2019-01-17 NOTE — H&P (Addendum)
[facility] [place] at [facility]   PATIENT NAME: Martin Bean    MR#:  [id number]  DATE OF BIRTH:  23-Jun-1930  DATE OF ADMISSION:  01/17/2019  PRIMARY CARE PHYSICIAN: Gracelyn Nurse, MD   REQUESTING/REFERRING PHYSICIAN: Sharyn Creamer  CHIEF COMPLAINT:   Chief Complaint  Patient presents with   Shortness of Breath    HISTORY OF PRESENT ILLNESS:  Martin Bean  is a 85 y.o. male with a known history of CVA, chronic diastolic CHF, hypertension, hyperlipidemia and enlarged prostate who was recently admitted and treated for pneumonia about 2 weeks ago.  Presented to the emergency room today with worsening shortness of breath and cough over the last 24 hours.  Denies any fevers.  Patient was evaluated in the emergency room and COVID test was negative.  Laboratory studies done significant for leukocytosis with white count of 24,000 which is slightly worse than previously.  Chest x-ray done revealed new left lower lobe airspace disease compatible with pneumonia.  Also revealed continued right lower lobe infiltrate when compared to prior study.  Patient diagnosed with hospital-acquired pneumonia, bilateral.  Started on broad-spectrum IV antibiotics with vancomycin and cefepime.  Medical service called to admit patient for further evaluation and management.  PAST MEDICAL HISTORY:   Past Medical History:  Diagnosis Date   Carotid arterial disease (HCC) 10/02/2017   CVA (cerebral vascular accident) (HCC) 08/14/2017   Depression    Diastolic heart failure (HCC)    Family history of adverse reaction to anesthesia    HOH (hard of hearing)    HTN (hypertension)    Hydropneumothorax 11/01/2017   Hyperlipidemia 10/09/2017   Malnutrition of moderate degree 11/02/2017   Pneumonia    Prostate enlargement    Psoriasis     PAST SURGICAL HISTORY:   Past Surgical History:  Procedure Laterality Date   CATARACT EXTRACTION W/PHACO Right 02/25/2018   Procedure:  CATARACT EXTRACTION PHACO AND INTRAOCULAR LENS PLACEMENT (IOC);  Surgeon: Galen Manila, MD;  Location: [facility] ORS;  Service: Ophthalmology;  Laterality: Right;  [place] 01:03.2 AP% 17.1 CDE 10.83 Fluid Pack lot #[id number]   CATARACT EXTRACTION W/PHACO Left 03/18/2018   Procedure: CATARACT EXTRACTION PHACO AND INTRAOCULAR LENS PLACEMENT (IOC);  Surgeon: Galen Manila, MD;  Location: [facility] ORS;  Service: Ophthalmology;  Laterality: Left;  [place] 1.11 AP% 16.7 CDE 11.90 Fluid pack lot # [id number]   ENDARTERECTOMY Left 10/02/2017   Procedure: ENDARTERECTOMY CAROTID;  Surgeon: Annice Needy, MD;  Location: [facility] ORS;  Service: Vascular;  Laterality: Left;   HERNIA REPAIR      SOCIAL HISTORY:   Social History   Tobacco Use   Smoking status: Former Smoker    Types: Cigarettes    Last attempt to quit: 09/24/2013    Years since quitting: 5.3   Smokeless tobacco: Never Used  Substance Use Topics   Alcohol use: No    Frequency: Never    FAMILY HISTORY:   Family History  Problem Relation Age of Onset   Stroke Mother     DRUG ALLERGIES:  No Known Allergies  REVIEW OF SYSTEMS:   Review of Systems  Constitutional: Negative for chills and fever.  HENT: Negative for ear pain and hearing loss.        Decreased hearing  Eyes: Negative for blurred vision and double vision.  Respiratory: Positive for cough, sputum production and shortness of breath.   Cardiovascular: Negative for chest pain and palpitations.  Gastrointestinal: Negative for heartburn and nausea.  Genitourinary:  Negative for dysuria and urgency.  Musculoskeletal: Negative for myalgias.  Skin: Negative for rash.  Neurological: Negative for dizziness and headaches.  Psychiatric/Behavioral: Negative for depression and hallucinations.    MEDICATIONS AT HOME:   Prior to Admission medications   Medication Sig Start Date End Date Taking? Authorizing Provider  aspirin EC 81 MG tablet Take 81 mg by mouth daily.  08/16/17  Yes  [provider]  atorvastatin (LIPITOR) 40 MG tablet Take 1 tablet (40 mg total) by mouth daily at 6 PM. 08/15/17  Yes Sudini, Srikar, MD  cyanocobalamin (,VITAMIN B-12,) 1000 MCG/ML injection Inject into the muscle. 02/26/18  Yes [provider]  guaiFENesin (MUCINEX) 600 MG 12 hr tablet Take 1 tablet (600 mg total) by mouth 2 (two) times daily. 12/30/18 12/30/19 Yes Salary, Evelena AsaMontell D, MD  sertraline (ZOLOFT) 100 MG tablet Take 100 mg by mouth daily. 10/16/18 10/16/19 Yes [provider]  tamsulosin (FLOMAX) 0.4 MG CAPS capsule Take 0.4 mg by mouth daily.  06/03/17  Yes [provider]  amLODipine (NORVASC) 5 MG tablet Take 1 tablet (5 mg total) by mouth daily. 12/30/18   Salary, Evelena AsaMontell D, MD  carvedilol (COREG) 3.125 MG tablet Take 1 tablet (3.125 mg total) by mouth 2 (two) times daily. Patient not taking: Reported on 03/18/2018 11/06/17 11/06/18  Adrian SaranMody, Sital, MD  feeding supplement, ENSURE ENLIVE, (ENSURE ENLIVE) LIQD Take 237 mLs by mouth 2 (two) times daily between meals. 11/06/17   Adrian SaranMody, Sital, MD  HYDROcodone-acetaminophen (NORCO/VICODIN) 5-325 MG tablet Take 1-2 tablets by mouth every 4 (four) hours as needed for moderate pain. Patient not taking: Reported on 02/18/2018 11/06/17   Adrian SaranMody, Sital, MD  levofloxacin (LEVAQUIN) 750 MG tablet Take 1 tablet (750 mg total) by mouth daily. Patient not taking: Reported on 01/17/2019 12/30/18   Salary, Jetty DuhamelMontell D, MD      VITAL SIGNS:  Blood pressure (!) 148/67, pulse 75, temperature 99.6 F (37.6 C), temperature source Oral, resp. rate 20, height 5\' 11"  (1.803 m), weight 67.3 kg, SpO2 98 %.  PHYSICAL EXAMINATION:  Physical Exam  GENERAL:  85 y.o.-year-old patient lying in the bed with no acute distress.  EYES: Pupils equal, round, reactive to light and accommodation. No scleral icterus. Extraocular muscles intact.  HEENT: Head atraumatic, normocephalic. Oropharynx and nasopharynx clear.  NECK:  Supple, no jugular venous  distention. No thyroid enlargement, no tenderness.  LUNGS: Rhonchi bilaterally.  No wheezing.  No use of accessory muscles of respiration.  CARDIOVASCULAR: S1, S2 normal. No murmurs, rubs, or gallops.  ABDOMEN: Soft, nontender, nondistended. Bowel sounds present. No organomegaly or mass.  EXTREMITIES: No pedal edema, cyanosis, or clubbing.  NEUROLOGIC: Cranial nerves II through XII are intact. Muscle strength 5/5 in all extremities. Sensation intact. Gait not checked.  PSYCHIATRIC: The patient is alert and oriented x 3.  SKIN: No obvious rash, lesion, or ulcer.   LABORATORY PANEL:   CBC Recent Labs  Lab 01/17/19 1414  WBC 24.1*  HGB 10.0*  HCT 31.7*  PLT 347   ------------------------------------------------------------------------------------------------------------------  Chemistries  Recent Labs  Lab 01/17/19 1414  NA 136  K 3.9  CL 104  CO2 23  GLUCOSE 192*  BUN 14  CREATININE 0.81  CALCIUM 8.1*  AST 21  ALT 17  ALKPHOS 143*  BILITOT 1.0   ------------------------------------------------------------------------------------------------------------------  Cardiac Enzymes Recent Labs  Lab 01/17/19 1414  TROPONINI <0.03   ------------------------------------------------------------------------------------------------------------------  RADIOLOGY:  Dg Chest Port 1 View  Result Date: 01/17/2019 CLINICAL DATA:  Cough  and dyspnea EXAM: PORTABLE CHEST 1 VIEW COMPARISON:  12/28/2018 FINDINGS: Chronic cardiomegaly. Stable mediastinal contours. Coarse lung markings and reticulonodular opacity at the bases, worse on the left. IMPRESSION: 1. New left lower lobe airspace disease compatible with pneumonia in this setting. 2. Continued right lower lobe infiltrate when compared to 12/28/2018. 3. Recommend follow-up after treatment. Electronically Signed   By: Marnee Spring M.D.   On: 01/17/2019 15:03      IMPRESSION AND PLAN:  Patient is an 85 year old male with history  of CVA, chronic diastolic CHF, hypertension, hyperlipidemia and enlarged prostate who was recently admitted and treated for pneumonia about 2 weeks ago.  Presented to the emergency room today with worsening shortness of breath and cough over the last 24 hours.  Patient diagnosed with hospital-acquired pneumonia.  1.  Hospital-acquired pneumonia.  Bilateral Recently admitted and treated for pneumonia within the last 2 weeks. Started on broad-spectrum IV antibiotics with IV vancomycin and cefepime with pharmacy to assist with renal dosing. IV fluid hydration.  Clinically patient does not appear septic.  Follow-up on cultures.  2.  Hypoxia secondary to bilateral pneumonia. Patient reported to have O2 sat of 88% in the emergency room on room air.  Was started on supplemental oxygen.  Continue the same and wean off as tolerated.  3.  Chronic diastolic CHF. Stable.  Monitor cardiopulmonary status closely.  4.  Hypertension. Resume home blood pressure medications.  Monitor.  5. DVT prophylaxis; Lovenox     All the records are reviewed and case discussed with ED provider. Management plans discussed with the patient, he is in agreement.  CODE STATUS: Full code  TOTAL TIME TAKING CARE OF THIS PATIENT: 56 minutes.    Ala Capri M.D on 01/17/2019 at 4:46 PM  Between 7am to 6pm - Pager - [phone]  After 6pm go to [email or web address] - Social research officer, government  [facility] [place]  Office  [phone]  CC: Primary care physician; Gracelyn Nurse, MD   Note: This dictation was prepared with Dragon dictation along with smaller phrase technology. Any transcriptional errors that result from this process are unintentional.

## 2019-01-17 NOTE — ED Notes (Signed)
Patient assisted using urinal at [facility]

## 2019-01-17 NOTE — ED Notes (Signed)
Daughter [name] will be available by phone-phone number is in chart.

## 2019-01-17 NOTE — Progress Notes (Signed)
Care Alignment Note  Advanced Directives Documents (Living Will, Power of Attorney) currently in the EHR no advanced directives documents available .  Has the patient discussed their wishes with their family/healthcare power of attorney no.  What does the patient/decision maker understand about their medical condition and the natural course of their disease.  Bilateral pneumonia.  Hypertension.  Chronic diastolic CHF.  Hypoxia secondary to pneumonia  What is the patient/decision maker's biggest fear or concern for the future pain and suffering   What is the most important goal for this patient should their health condition worsen maintenance of function.  Current   Code Status: Full Code  Current code status has been reviewed/updated.  Time spent:20 minutes

## 2019-01-17 NOTE — Consult Note (Signed)
Pharmacy Antibiotic Note  Martin Bean is a 85 y.o. male admitted on 01/17/2019 with HAP .  Pharmacy has been consulted for Vancomycin/Cefepime dosing. Cefepime and Vancomycin were ordered in the ED.   Baseline Scr (12/29/2018): 0.98  Plan: 1) Will start Cefepime 2g Q8H @ 0000 on 5/31.  2)Patient was given a loading dose of 1500 mg X1. Will start maintenance dose of 1250 mg Q24 hours @ 1700 on 5/31.  AUC goal 400-550 Scr Used: 0.81 Expected AUC 459.7 Cssmin 9.9  Height: 5\' 11"  (180.3 cm) Weight: 148 lb 5.9 oz (67.3 kg) IBW/kg (Calculated) : 75.3  Temp (24hrs), Avg:99.6 F (37.6 C), Min:99.6 F (37.6 C), Max:99.6 F (37.6 C)  Recent Labs  Lab 01/17/19 1414  WBC 24.1*  CREATININE 0.81  LATICACIDVEN 2.0*    Estimated Creatinine Clearance: 62.3 mL/min (by C-G formula based on SCr of 0.81 mg/dL).    No Known Allergies  Antimicrobials this admission: 5/30 Cefepime >> 5/30 Vancomycin  >>   Dose adjustments this admission: N/A  Microbiology results: 5/30 BCx: pending    Thank you for allowing pharmacy to be a part of this patient's care.  Katha Cabal 01/17/2019 4:06 PM

## 2019-01-17 NOTE — ED Provider Notes (Signed)
[facility] Emergency Department Provider Note   ____________________________________________   First MD Initiated Contact with Patient 01/17/19 1400     (approximate)  I have reviewed the triage vital signs and the nursing notes.   HISTORY  Chief Complaint Shortness of Breath    HPI Sarita HaverJasper L Ruggiero is a 85 y.o. male here for evaluation of return of shortness of breath  Patient tells me he was treated for pneumonia out of the hospital about a week and a half ago, this morning he started noticing he is getting short of breath again.  Is been a little bit progressive last couple days but really this morning he started noticed that he is having a dry cough.  No exposure to coronavirus.  Just feels fatigued weak short of breath, somewhat better on oxygen allergies been placed on it.  Does not use oxygen at home.  No chest pain.  No leg pain.  No rash.  No headache.  Was on antibiotics for pneumonia.   Past Medical History:  Diagnosis Date  . Carotid arterial disease (HCC) 10/02/2017  . CVA (cerebral vascular accident) (HCC) 08/14/2017  . Depression   . Diastolic heart failure (HCC)   . Family history of adverse reaction to anesthesia   . HOH (hard of hearing)   . HTN (hypertension)   . Hydropneumothorax 11/01/2017  . Hyperlipidemia 10/09/2017  . Malnutrition of moderate degree 11/02/2017  . Pneumonia   . Prostate enlargement   . Psoriasis     Patient Active Problem List   Diagnosis Date Noted  . Community acquired pneumonia 12/28/2018  . Acute renal failure (ARF) (HCC) 12/28/2018  . Pneumonia 12/28/2018  . Hemothorax   . Malnutrition of moderate degree 11/02/2017  . Hydropneumothorax 11/01/2017  . Essential hypertension 10/09/2017  . Hyperlipidemia 10/09/2017  . Carotid arterial disease (HCC) 10/02/2017  . Carotid artery stenosis, unilateral, left 09/04/2017  . CVA (cerebral vascular accident) (HCC) 08/14/2017  . Benign prostatic hyperplasia  with urinary frequency 06/03/2017  . Depression, prolonged 06/03/2017  . 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 Psoriasis, unspecified 06/03/2017    Past Surgical History:  Procedure Laterality Date  . CATARACT EXTRACTION W/PHACO Right 02/25/2018   Procedure: CATARACT EXTRACTION PHACO AND INTRAOCULAR LENS PLACEMENT (IOC);  Surgeon: Galen ManilaPorfilio, William, MD;  Location: [facility] ORS;  Service: Ophthalmology;  Laterality: Right;  US 01:03.2 AP% 17.1 CDE 10.83 Fluid Pack lot #[id number]  . CATARACT EXTRACTION W/PHACO Left 03/18/2018   Procedure: CATARACT EXTRACTION PHACO AND INTRAOCULAR LENS PLACEMENT (IOC);  Surgeon: Galen ManilaPorfilio, William, MD;  Location: [facility] ORS;  Service: Ophthalmology;  Laterality: Left;  US 1.11 AP% 16.7 CDE 11.90 Fluid pack lot # [id number] H  . ENDARTERECTOMY Left 10/02/2017   Procedure: ENDARTERECTOMY CAROTID;  Surgeon: Annice Needyew, Jason S, MD;  Location: [facility] ORS;  Service: Vascular;  Laterality: Left;  . HERNIA REPAIR      Prior to Admission medications   Medication Sig Start Date End Date Taking? Authorizing Provider  amLODipine (NORVASC) 5 MG tablet Take 1 tablet (5 mg total) by mouth daily. 12/30/18   Salary, Jetty DuhamelMontell D, MD  aspirin EC 81 MG tablet Take 81 mg by mouth daily.  08/16/17   [provider]  atorvastatin (LIPITOR) 40 MG tablet Take 1 tablet (40 mg total) by mouth daily at 6 PM. 08/15/17   Milagros LollSudini, Srikar, MD  carvedilol (COREG) 3.125 MG tablet Take 1 tablet (3.125 mg total) by mouth 2 (two) times daily. Patient not taking: Reported on 03/18/2018 11/06/17 11/06/18  Adrian Saran, MD  cyanocobalamin (,VITAMIN B-12,) 1000 MCG/ML injection Inject into the muscle. 02/26/18   [provider]  feeding supplement, ENSURE ENLIVE, (ENSURE ENLIVE) LIQD Take 237 mLs by mouth 2 (two) times daily between meals. 11/06/17   Adrian Saran, MD  guaiFENesin (MUCINEX) 600 MG 12 hr tablet Take 1 tablet (600 mg total) by mouth 2 (two) times daily. 12/30/18 12/30/19  Salary, Evelena Asa, MD   HYDROcodone-acetaminophen (NORCO/VICODIN) 5-325 MG tablet Take 1-2 tablets by mouth every 4 (four) hours as needed for moderate pain. Patient not taking: Reported on 02/18/2018 11/06/17   Adrian Saran, MD  levofloxacin (LEVAQUIN) 750 MG tablet Take 1 tablet (750 mg total) by mouth daily. 12/30/18   Salary, Evelena Asa, MD  sertraline (ZOLOFT) 100 MG tablet Take 100 mg by mouth daily. 10/16/18 10/16/19  [provider]  tamsulosin (FLOMAX) 0.4 MG CAPS capsule Take 0.4 mg by mouth daily.  06/03/17   [provider]    Allergies Patient has no known allergies.  Family History  Problem Relation Age of Onset  . Stroke Mother     Social History Social History   Tobacco Use  . Smoking status: Former Smoker    Types: Cigarettes    Last attempt to quit: 09/24/2013    Years since quitting: 5.3  . Smokeless tobacco: Never Used  Substance Use Topics  . Alcohol use: No    Frequency: Never  . Drug use: No    Review of Systems Constitutional: No fever/chills but having some fatigue Eyes: No visual changes. ENT: No sore throat. Cardiovascular: Denies chest pain. Respiratory: Cough and shortness of breath Gastrointestinal: No abdominal pain.   Genitourinary: Negative for dysuria. Musculoskeletal: Negative for back pain. Skin: Negative for rash. Neurological: Negative for headaches, areas of focal weakness or numbness.    ____________________________________________   PHYSICAL EXAM:  VITAL SIGNS: ED Triage Vitals  Enc Vitals Group     BP 01/17/19 1351 (!) 108/46     Pulse Rate 01/17/19 1351 84     Resp 01/17/19 1351 20     Temp 01/17/19 1351 99.6 F (37.6 C)     Temp Source 01/17/19 1351 Oral     SpO2 01/17/19 1351 (!) 87 %     Weight 01/17/19 1353 148 lb 5.9 oz (67.3 kg)     Height 01/17/19 1353  (1.803 m)     Head Circumference --      Peak Flow --      Pain Score 01/17/19 1353 0     Pain Loc --      Pain Edu? --      Excl. in GC? --      Constitutional: Alert and oriented.  Mildly ill-appearing, just slightly dyspneic in appearance. Eyes: Conjunctivae are normal. Head: Atraumatic. Nose: No congestion/rhinnorhea.  Very hard of hearing Mouth/Throat: Mucous membranes are moist. Neck: No stridor.  Cardiovascular: Normal rate, regular rhythm. Grossly normal heart sounds.  Good peripheral circulation. Respiratory: Normal respiratory effort for just slight tachypnea.  No retractions. Lungs CTAB. Gastrointestinal: Soft and nontender. No distention. Musculoskeletal: No lower extremity tenderness nor edema. Neurologic:  Normal speech and language. No gross focal neurologic deficits are appreciated.  Skin:  Skin is warm, dry and intact. No rash noted. Psychiatric: Mood and affect are normal. Speech and behavior are normal.  ____________________________________________   LABS (all labs ordered are listed, but only abnormal results are displayed)  Labs Reviewed  LACTIC ACID, PLASMA - Abnormal; Notable for the  following components:      Result Value   Lactic Acid, Venous 2.0 (*)    All other components within normal limits  COMPREHENSIVE METABOLIC PANEL - Abnormal; Notable for the following components:   Glucose, Bld 192 (*)    Calcium 8.1 (*)    Albumin 2.8 (*)    Alkaline Phosphatase 143 (*)    All other components within normal limits  CBC WITH DIFFERENTIAL/PLATELET - Abnormal; Notable for the following components:   WBC 24.1 (*)    RBC 3.57 (*)    Hemoglobin 10.0 (*)    HCT 31.7 (*)    RDW 17.3 (*)    Neutro Abs 21.7 (*)    Lymphs Abs 0.6 (*)    Monocytes Absolute 1.3 (*)    Abs Immature Granulocytes 0.42 (*)    All other components within normal limits  PROTIME-INR - Abnormal; Notable for the following components:   Prothrombin Time 16.0 (*)    INR 1.3 (*)    All other components within normal limits  SARS CORONAVIRUS 2 (HOSPITAL ORDER, PERFORMED IN [facility])  CULTURE, BLOOD (ROUTINE X 2)   CULTURE, BLOOD (ROUTINE X 2)  LIPASE, BLOOD  TROPONIN I   ____________________________________________  EKG   ____________________________________________  RADIOLOGY  Dg Chest Port 1 View  Result Date: 01/17/2019 CLINICAL DATA:  Cough and dyspnea EXAM: PORTABLE CHEST 1 VIEW COMPARISON:  12/28/2018 FINDINGS: Chronic cardiomegaly. Stable mediastinal contours. Coarse lung markings and reticulonodular opacity at the bases, worse on the left. IMPRESSION: 1. New left lower lobe airspace disease compatible with pneumonia in this setting. 2. Continued right lower lobe infiltrate when compared to 12/28/2018. 3. Recommend follow-up after treatment. Electronically Signed   By: Marnee Spring M.D.   On: 01/17/2019 15:03    Chest x-ray, concerning for bilateral infiltrates ____________________________________________   PROCEDURES  Procedure(s) performed: None  Procedures  Critical Care performed: No  ____________________________________________   INITIAL IMPRESSION / ASSESSMENT AND PLAN / ED COURSE  Pertinent labs & imaging results that were available during my care of the patient were reviewed by me and considered in my medical decision making (see chart for details).   Patient presents for recurrent shortness of breath and cough.  Recently treated for pneumonia.  Chest x-ray concerning for bilateral infiltrative pneumonia.  Additionally, elevated white count, he also had oxygen requirement with some very mild hypoxia on room air.  Discussed with patient, will be admitting, starting on broad-spectrum coverage for H CAP.  Will rule out COVID via testing, though it does not appear to be high risk for COVID but rather is most consistent with recurrent pneumonia, healthcare associated.  No cardiac symptoms.  Ongoing care assigned to Dr. Scotty Court, patient admitting to the hospitalist service, Dr. Peter Minium admitting      ____________________________________________   FINAL CLINICAL  IMPRESSION(S) / ED DIAGNOSES  Final diagnoses:  HCAP (healthcare-associated pneumonia)  Hypoxia        Note:  This document was prepared using Dragon voice recognition software and may include unintentional dictation errors       Sharyn Creamer, MD 01/17/19 1546

## 2019-01-17 NOTE — ED Notes (Addendum)
Patient given ED sandwich tray and cola. Patient was assisted to room commode. Patient had steady gait. Patient had increased dyspnea with exertion.

## 2019-01-17 NOTE — ED Triage Notes (Signed)
Pt arrived via POV from home with family member with reports of cough and shortness of breath. Pt reports when he woke up this morning he was short of breath walking around the house. Pt was admitted to hospital earlier this month. Pt had a fall on 5/8. Pt has been taking motrin and mucinex since discharge.  Pt also reports dyspnea at rest.

## 2019-01-18 LAB — URINALYSIS, COMPLETE (UACMP) WITH MICROSCOPIC
Bacteria, UA: NONE SEEN
Bilirubin Urine: NEGATIVE
Glucose, UA: NEGATIVE mg/dL
Hgb urine dipstick: NEGATIVE
Ketones, ur: NEGATIVE mg/dL
Leukocytes,Ua: NEGATIVE
Nitrite: NEGATIVE
Protein, ur: NEGATIVE mg/dL
Specific Gravity, Urine: 1.01 (ref 1.005–1.030)
Squamous Epithelial / HPF: NONE SEEN (ref 0–5)
pH: 6 (ref 5.0–8.0)

## 2019-01-18 LAB — BLOOD CULTURE ID PANEL (REFLEXED)

## 2019-01-18 LAB — BASIC METABOLIC PANEL
Anion gap: 7 (ref 5–15)
BUN: 14 mg/dL (ref 8–23)
CO2: 24 mmol/L (ref 22–32)
Calcium: 7.8 mg/dL — ABNORMAL LOW (ref 8.9–10.3)
Chloride: 108 mmol/L (ref 98–111)
Creatinine, Ser: 0.72 mg/dL (ref 0.61–1.24)
GFR calc Af Amer: >60 mL/min (ref >60)
GFR calc non Af Amer: >60 mL/min (ref >60)
Glucose, Bld: 116 mg/dL — ABNORMAL HIGH (ref 70–99)
Potassium: 3.6 mmol/L (ref 3.5–5.1)
Sodium: 139 mmol/L (ref 135–145)

## 2019-01-18 LAB — CBC
HCT: 26.8 % — ABNORMAL LOW (ref 39.0–52.0)
Hemoglobin: 8.8 g/dL — ABNORMAL LOW (ref 13.0–17.0)
MCH: 28.6 pg (ref 26.0–34.0)
MCHC: 32.8 g/dL (ref 30.0–36.0)
MCV: 87 fL (ref 80.0–100.0)
Platelets: 281 10*3/uL (ref 150–400)
RBC: 3.08 MIL/uL — ABNORMAL LOW (ref 4.22–5.81)
RDW: 17.2 % — ABNORMAL HIGH (ref 11.5–15.5)
WBC: 20.8 10*3/uL — ABNORMAL HIGH (ref 4.0–10.5)
nRBC: 0 % (ref 0.0–0.2)

## 2019-01-18 LAB — STREP PNEUMONIAE URINARY ANTIGEN: Strep Pneumo Urinary Antigen: NEGATIVE

## 2019-01-18 LAB — MAGNESIUM: Magnesium: 2 mg/dL (ref 1.7–2.4)

## 2019-01-18 MED ORDER — HYDRALAZINE HCL 20 MG/ML IJ SOLN
10.0000 mg | Freq: Four times a day (QID) | INTRAMUSCULAR | Status: DC | PRN
  Administered 2019-01-18: 10 mg via INTRAVENOUS
  Filled 2019-01-18: qty 1

## 2019-01-18 NOTE — ED Notes (Signed)
Report given to Julie,RN. 

## 2019-01-18 NOTE — ED Notes (Signed)
Lab called and stated HIV test needed two tiger top tubes- will collect second when antibiotic is done running

## 2019-01-18 NOTE — ED Notes (Signed)
Patient reminded to keep right arm straight so medications and fluids can infused.

## 2019-01-18 NOTE — ED Notes (Signed)
Spoke with daughter, [name], and gave her an update on pt condition

## 2019-01-18 NOTE — ED Notes (Signed)
Patient resting quietly with eyes closed in no acute distress.  

## 2019-01-18 NOTE — ED Notes (Signed)
Daughter, [name], given update on pt going upstairs

## 2019-01-18 NOTE — ED Notes (Signed)
Pt gave verbal permission to speak with daughter, [name], and give her an update

## 2019-01-18 NOTE — ED Notes (Signed)
Pt eating breakfast tray 

## 2019-01-18 NOTE — ED Notes (Signed)
Patient used urinal at bedside

## 2019-01-18 NOTE — ED Notes (Signed)
Pt O2 increased to 6L 

## 2019-01-18 NOTE — Progress Notes (Addendum)
[employer] [facility] [place] [facility]   PATIENT NAME: Martin Bean    MR#:  [id number]  DATE OF BIRTH:  04/04/1930  SUBJECTIVE:  CHIEF COMPLAINT:   Chief Complaint  Patient presents with  . Shortness of Breath   -Feels short of breath.  Recent discharge for pneumonia as well.  Currently needing oxygen acutely  REVIEW OF SYSTEMS:  Review of Systems  Constitutional: Positive for malaise/fatigue. Negative for chills and fever.  HENT: Negative for congestion, ear discharge, hearing loss and nosebleeds.   Eyes: Negative for blurred vision and double vision.  Respiratory: Positive for shortness of breath. Negative for cough and wheezing.   Cardiovascular: Negative for chest pain and palpitations.  Gastrointestinal: Negative for abdominal pain, constipation, diarrhea, nausea and vomiting.  Genitourinary: Negative for dysuria.  Musculoskeletal: Negative for myalgias.  Neurological: Negative for dizziness, focal weakness, seizures, weakness and headaches.  Psychiatric/Behavioral: Negative for depression.    DRUG ALLERGIES:  No Known Allergies  VITALS:  Blood pressure (!) 184/80, pulse 73, temperature 97.6 F (36.4 C), temperature source Oral, resp. rate 16, height 5\' 11"  (1.803 m), weight 67.3 kg, SpO2 100 %.  PHYSICAL EXAMINATION:  Physical Exam   GENERAL:  85 y.o.-year-old elderly patient lying in the bed with no acute distress.  EYES: Pupils equal, round, reactive to light and accommodation. No scleral icterus. Extraocular muscles intact.  HEENT: Head atraumatic, normocephalic. Oropharynx and nasopharynx clear.  NECK:  Supple, no jugular venous distention. No thyroid enlargement, no tenderness.  LUNGS: Coarse rhonchorous breath sounds bilaterally, no wheezing, rales  or crepitation. No use of accessory muscles of respiration.  CARDIOVASCULAR: S1, S2 normal. No  rubs, or gallops.  3/6 systolic murmur is present ABDOMEN: Soft, nontender, nondistended. Bowel  sounds present. No organomegaly or mass.  EXTREMITIES: No pedal edema, cyanosis, or clubbing.  NEUROLOGIC: Cranial nerves II through XII are intact. Muscle strength 5/5 in all extremities. Sensation intact. Gait not checked.  Overall weakness noted PSYCHIATRIC: The patient is alert and oriented x 3.  SKIN: No obvious rash, lesion, or ulcer.    LABORATORY PANEL:   CBC Recent Labs  Lab 01/18/19 0431  WBC 20.8*  HGB 8.8*  HCT 26.8*  PLT 281   ------------------------------------------------------------------------------------------------------------------  Chemistries  Recent Labs  Lab 01/17/19 1414 01/18/19 0431  NA 136 139  K 3.9 3.6  CL 104 108  CO2 23 24  GLUCOSE 192* 116*  BUN 14 14  CREATININE 0.81 0.72  CALCIUM 8.1* 7.8*  MG  --  2.0  AST 21  --   ALT 17  --   ALKPHOS 143*  --   BILITOT 1.0  --    ------------------------------------------------------------------------------------------------------------------  Cardiac Enzymes Recent Labs  Lab 01/17/19 1414  TROPONINI <0.03   ------------------------------------------------------------------------------------------------------------------  RADIOLOGY:  Dg Chest Port 1 View  Result Date: 01/17/2019 CLINICAL DATA:  Cough and dyspnea EXAM: PORTABLE CHEST 1 VIEW COMPARISON:  12/28/2018 FINDINGS: Chronic cardiomegaly. Stable mediastinal contours. Coarse lung markings and reticulonodular opacity at the bases, worse on the left. IMPRESSION: 1. New left lower lobe airspace disease compatible with pneumonia in this setting. 2. Continued right lower lobe infiltrate when compared to 12/28/2018. 3. Recommend follow-up after treatment. Electronically Signed   By: Marnee SpringJonathon  Watts M.D.   On: 01/17/2019 15:03    EKG:   Orders placed or performed during the hospital encounter of 01/17/19  . ED EKG 12-Lead  . ED EKG 12-Lead    ASSESSMENT AND PLAN:   85 year old  male with past medical history significant for stroke,  hypertension, BPH, diastolic CHF who was in the hospital 2 weeks ago for pneumonia got discharged and comes back with worsening shortness of breath and cough.  1.  Healthcare acquired pneumonia-chest x-ray showing new left-sided pneumonia and continued right-sided infiltrate from last admission. -Follow blood cultures.  Continue vancomycin and cefepime for now -Gentle hydration.  Oxygen support as needed.  Wean off as tolerated.  2.  Hypertension-continue Norvasc.  PRN hydralazine for elevated blood pressure  3.  Hyperlipidemia-on statin  4.  BPH-Flomax  5.  DVT prophylaxis-Lovenox  Physical therapy consult requested Updated the daughters over the phone today   All the records are reviewed and case discussed with Care Management/Social Workerr. Management plans discussed with the patient, family and they are in agreement.  CODE STATUS: Full code  TOTAL TIME TAKING CARE OF THIS PATIENT: 39 minutes.   POSSIBLE D/C IN 2 DAYS, DEPENDING ON CLINICAL CONDITION.   Enid Baas M.D on 01/18/2019 at 2:01 PM  Between 7am to 6pm - Pager - [phone]  After 6pm go to [email or web address] - password [email or web address]  [facility]  Office  [phone]  CC: Primary care physician; Gracelyn Nurse, MD

## 2019-01-18 NOTE — ED Notes (Signed)
Patient given water at this time.  

## 2019-01-18 NOTE — ED Notes (Signed)
ED TO INPATIENT HANDOFF REPORT  ED Nurse Name and Phone #: Cordera Stineman [phone]  S Name/Age/Gender Martin Bean 85 y.o. male Room/Bed: ED24A/ED24A  Code Status   Code Status: Full Code  Home/SNF/Other Home Patient oriented to: self, place, time and situation Is this baseline? Yes   Triage Complete: Triage complete  Chief Complaint sob  Triage Note Pt arrived via POV from home with family member with reports of cough and shortness of breath. Pt reports when he woke up this morning he was short of breath walking around the house. Pt was admitted to hospital earlier this month. Pt had a fall on 5/8. Pt has been taking motrin and mucinex since discharge.  Pt also reports dyspnea at rest.    Allergies No Known Allergies  Level of Care/Admitting Diagnosis ED Disposition    ED Disposition Condition Comment   Admit  Hospital Area: [facility] [[id number]]  Level of Care: Med-Surg [16]  Covid Evaluation: Confirmed COVID Negative  Diagnosis: Bilateral pneumonia [130865]  Admitting Physician: Jama Flavors [[id number]]  Attending Physician: Jama Flavors [[id number]]  Estimated length of stay: past midnight tomorrow  Certification:: I certify this patient will need inpatient services for at least 2 midnights  PT Class (Do Not Modify): Inpatient [101]  PT Acc Code (Do Not Modify): Private [1]       B Medical/Surgery History Past Medical History:  Diagnosis Date  . Carotid arterial disease (HCC) 10/02/2017  . CVA (cerebral vascular accident) (HCC) 08/14/2017  . Depression   . Diastolic heart failure (HCC)   . Family history of adverse reaction to anesthesia   . HOH (hard of hearing)   . HTN (hypertension)   . Hydropneumothorax 11/01/2017  . Hyperlipidemia 10/09/2017  . Malnutrition of moderate degree 11/02/2017  . Pneumonia   . Prostate enlargement   . Psoriasis    Past Surgical History:  Procedure Laterality Date  . CATARACT EXTRACTION W/PHACO Right 02/25/2018   Procedure:  CATARACT EXTRACTION PHACO AND INTRAOCULAR LENS PLACEMENT (IOC);  Surgeon: Galen Manila, MD;  Location: [facility] ORS;  Service: Ophthalmology;  Laterality: Right;  [place] 01:03.2 AP% 17.1 CDE 10.83 Fluid Pack lot #[id number]  . CATARACT EXTRACTION W/PHACO Left 03/18/2018   Procedure: CATARACT EXTRACTION PHACO AND INTRAOCULAR LENS PLACEMENT (IOC);  Surgeon: Galen Manila, MD;  Location: [facility] ORS;  Service: Ophthalmology;  Laterality: Left;  [place] 1.11 AP% 16.7 CDE 11.90 Fluid pack lot # [id number]  . ENDARTERECTOMY Left 10/02/2017   Procedure: ENDARTERECTOMY CAROTID;  Surgeon: Annice Needy, MD;  Location: [facility] ORS;  Service: Vascular;  Laterality: Left;  . HERNIA REPAIR       A IV Location/Drains/Wounds Patient Lines/Drains/Airways Status   Active Line/Drains/Airways    Name:   Placement date:   Placement time:   Site:   Days:   Peripheral IV 01/17/19 Right Antecubital   01/17/19    1550    Antecubital   1   Incision (Closed) 10/02/17 Neck Left   10/02/17    1115     473   Incision (Closed) 02/25/18 Eye Right   02/25/18    0803     327   Incision (Closed) 03/18/18 Eye Left   03/18/18    1133     306          Intake/Output Last 24 hours  Intake/Output Summary (Last 24 hours) at 01/18/2019 0925 Last data filed at 01/18/2019 0829 Gross per 24 hour  Intake 650 ml  Output 375 ml  Net 275 ml    Labs/Imaging Results for orders placed or performed during the hospital encounter of 01/17/19 (from the past 48 hour(s))  Lactic acid, plasma     Status: Abnormal   Collection Time: 01/17/19  2:14 PM  Result Value Ref Range   Lactic Acid, Venous 2.0 (HH) 0.5 - 1.9 mmol/L    Comment: CRITICAL RESULT CALLED TO, READ BACK BY AND VERIFIED WITH HUNTER ORE  ON 01/17/2019 BY FMW Performed at [facility], [address] [place], [place] [address]   Comprehensive metabolic panel     Status: Abnormal   Collection Time: 01/17/19  2:14 PM  Result Value Ref Range   Sodium 136 135 - 145  mmol/L   Potassium 3.9 3.5 - 5.1 mmol/L   Chloride 104 98 - 111 mmol/L   CO2 23 22 - 32 mmol/L   Glucose, Bld 192 (H) 70 - 99 mg/dL   BUN 14 8 - 23 mg/dL   Creatinine, Ser 8.46 0.61 - 1.24 mg/dL   Calcium 8.1 (L) 8.9 - 10.3 mg/dL   Total Protein 6.7 6.5 - 8.1 g/dL   Albumin 2.8 (L) 3.5 - 5.0 g/dL   AST 21 15 - 41 U/L   ALT 17 0 - 44 U/L   Alkaline Phosphatase 143 (H) 38 - 126 U/L   Total Bilirubin 1.0 0.3 - 1.2 mg/dL   GFR calc non Af Amer >60 >60 mL/min   GFR calc Af Amer >60 >60 mL/min   Anion gap 9 5 - 15    Comment: Performed at [facility], [address] [place], [place] [address]  Lipase, blood     Status: None   Collection Time: 01/17/19  2:14 PM  Result Value Ref Range   Lipase 19 11 - 51 U/L    Comment: Performed at [facility], [address] [place], [place] [address]  CBC WITH DIFFERENTIAL     Status: Abnormal   Collection Time: 01/17/19  2:14 PM  Result Value Ref Range   WBC 24.1 (H) 4.0 - 10.5 K/uL   RBC 3.57 (L) 4.22 - 5.81 MIL/uL   Hemoglobin 10.0 (L) 13.0 - 17.0 g/dL   HCT 24.4 (L) 01.0 - 27.2 %   MCV 88.8 80.0 - 100.0 fL   MCH 28.0 26.0 - 34.0 pg   MCHC 31.5 30.0 - 36.0 g/dL   RDW 53.6 (H) 64.4 - 03.4 %   Platelets 347 150 - 400 K/uL   nRBC 0.0 0.0 - 0.2 %   Neutrophils Relative % 90 %   Neutro Abs 21.7 (H) 1.7 - 7.7 K/uL   Lymphocytes Relative 3 %   Lymphs Abs 0.6 (L) 0.7 - 4.0 K/uL   Monocytes Relative 5 %   Monocytes Absolute 1.3 (H) 0.1 - 1.0 K/uL   Eosinophils Relative 0 %   Eosinophils Absolute 0.0 0.0 - 0.5 K/uL   Basophils Relative 0 %   Basophils Absolute 0.1 0.0 - 0.1 K/uL   Immature Granulocytes 2 %   Abs Immature Granulocytes 0.42 (H) 0.00 - 0.07 K/uL    Comment: Performed at [facility], [address] [place], [place] [address]  Protime-INR     Status: Abnormal   Collection Time: 01/17/19  2:14 PM  Result Value Ref Range   Prothrombin Time 16.0 (H) 11.4 - 15.2 seconds   INR 1.3 (H) 0.8 - 1.2     Comment: (NOTE) INR goal varies based on device and disease states.  Performed at [facility], [address] [place], [place] [address]   Blood Culture (routine x 2)     Status: None (Preliminary result)   Collection Time: 01/17/19  2:14 PM  Result Value Ref Range   Specimen Description BLOOD RAC    Special Requests      BOTTLES DRAWN AEROBIC AND ANAEROBIC Blood Culture adequate volume   Culture      NO GROWTH < 24 HOURS Performed at [facility], [address], [place], [place] [address]    Report Status PENDING   Blood Culture (routine x 2)     Status: None (Preliminary result)   Collection Time: 01/17/19  2:14 PM  Result Value Ref Range   Specimen Description BLOOD LAC    Special Requests      BOTTLES DRAWN AEROBIC AND ANAEROBIC Blood Culture adequate volume   Culture      NO GROWTH < 24 HOURS Performed at [facility], [address], [place], [place] [address]    Report Status PENDING   Troponin I - ONCE - STAT     Status: None   Collection Time: 01/17/19  2:14 PM  Result Value Ref Range   Troponin I <0.03 <0.03 ng/mL    Comment: Performed at [facility], [address] [place], [place] [address]  SARS Coronavirus 2 (CEPHEID- Performed in [facility] hospital lab), Hosp Order     Status: None   Collection Time: 01/17/19  2:14 PM  Result Value Ref Range   SARS Coronavirus 2 NEGATIVE NEGATIVE    Comment: (NOTE) If result is NEGATIVE SARS-CoV-2 target nucleic acids are NOT DETECTED. The SARS-CoV-2 RNA is generally detectable in upper and lower  respiratory specimens during the acute phase of infection. The lowest  concentration of SARS-CoV-2 viral copies this assay can detect is 250  copies / mL. A negative result does not preclude SARS-CoV-2 infection  and should not be used as the sole basis for treatment or other  patient management decisions.  A negative result may occur with  improper specimen collection / handling,  submission of specimen other  than nasopharyngeal swab, presence of viral mutation(s) within the  areas targeted by this assay, and inadequate number of viral copies  (<250 copies / mL). A negative result must be combined with clinical  observations, patient history, and epidemiological information. If result is POSITIVE SARS-CoV-2 target nucleic acids are DETECTED. The SARS-CoV-2 RNA is generally detectable in upper and lower  respiratory specimens dur ing the acute phase of infection.  Positive  results are indicative of active infection with SARS-CoV-2.  Clinical  correlation with patient history and other diagnostic information is  necessary to determine patient infection status.  Positive results do  not rule out bacterial infection or co-infection with other viruses. If result is PRESUMPTIVE POSTIVE SARS-CoV-2 nucleic acids MAY BE PRESENT.   A presumptive positive result was obtained on the submitted specimen  and confirmed on repeat testing.  While 2019 novel coronavirus  (SARS-CoV-2) nucleic acids may be present in the submitted sample  additional confirmatory testing may be necessary for epidemiological  and / or clinical management purposes  to differentiate between  SARS-CoV-2 and other Sarbecovirus currently known to infect humans.  If clinically indicated additional testing with an alternate test  methodology [phone]) is advised. The SARS-CoV-2 RNA is generally  detectable in upper and lower respiratory sp ecimens during the acute  phase of infection. The expected result is Negative. Fact  Sheet for Patients:  BoilerBrush.com.cy Fact Sheet for Healthcare Providers: https://pope.com/ This test is not yet approved or cleared by the [place] FDA and has been authorized for detection and/or diagnosis of SARS-CoV-2 by FDA under an Emergency Use Authorization (EUA).  This EUA will remain in effect (meaning this test can be  used) for the duration of the COVID-19 declaration under Section 564(b)(1) of the Act, 21 U.S.C. section 360bbb-3(b)(1), unless the authorization is terminated or revoked sooner. Performed at [facility], [address] [place], [place] [address]   Blood Culture ID Panel (Reflexed)     Status: None   Collection Time: 01/17/19  2:14 PM  Result Value Ref Range   Enterococcus species NOT DETECTED NOT DETECTED   Listeria monocytogenes NOT DETECTED NOT DETECTED   Staphylococcus species NOT DETECTED NOT DETECTED   Staphylococcus aureus (BCID) NOT DETECTED NOT DETECTED   Streptococcus species NOT DETECTED NOT DETECTED   Streptococcus agalactiae NOT DETECTED NOT DETECTED   Streptococcus pneumoniae NOT DETECTED NOT DETECTED   Streptococcus pyogenes NOT DETECTED NOT DETECTED   Acinetobacter baumannii NOT DETECTED NOT DETECTED   Enterobacteriaceae species NOT DETECTED NOT DETECTED   Enterobacter cloacae complex NOT DETECTED NOT DETECTED   Escherichia coli NOT DETECTED NOT DETECTED   Klebsiella oxytoca NOT DETECTED NOT DETECTED   Klebsiella pneumoniae NOT DETECTED NOT DETECTED   Proteus species NOT DETECTED NOT DETECTED   Serratia marcescens NOT DETECTED NOT DETECTED   Haemophilus influenzae NOT DETECTED NOT DETECTED   Neisseria meningitidis NOT DETECTED NOT DETECTED   Pseudomonas aeruginosa NOT DETECTED NOT DETECTED   Candida albicans NOT DETECTED NOT DETECTED   Candida glabrata NOT DETECTED NOT DETECTED   Candida krusei NOT DETECTED NOT DETECTED   Candida parapsilosis NOT DETECTED NOT DETECTED   Candida tropicalis NOT DETECTED NOT DETECTED    Comment: Performed at [facility], [address], [place], [place] [address]  Basic metabolic panel     Status: Abnormal   Collection Time: 01/18/19  4:31 AM  Result Value Ref Range   Sodium 139 135 - 145 mmol/L   Potassium 3.6 3.5 - 5.1 mmol/L   Chloride 108 98 - 111 mmol/L   CO2 24 22 - 32 mmol/L   Glucose, Bld 116 (H) 70 -  99 mg/dL   BUN 14 8 - 23 mg/dL   Creatinine, Ser 9.21 0.61 - 1.24 mg/dL   Calcium 7.8 (L) 8.9 - 10.3 mg/dL   GFR calc non Af Amer >60 >60 mL/min   GFR calc Af Amer >60 >60 mL/min   Anion gap 7 5 - 15    Comment: Performed at [facility], [address] [place], [place] [address]  CBC     Status: Abnormal   Collection Time: 01/18/19  4:31 AM  Result Value Ref Range   WBC 20.8 (H) 4.0 - 10.5 K/uL   RBC 3.08 (L) 4.22 - 5.81 MIL/uL   Hemoglobin 8.8 (L) 13.0 - 17.0 g/dL   HCT 40.8 (L) 14.4 - 81.8 %   MCV 87.0 80.0 - 100.0 fL   MCH 28.6 26.0 - 34.0 pg   MCHC 32.8 30.0 - 36.0 g/dL   RDW 56.3 (H) 14.9 - 70.2 %   Platelets 281 150 - 400 K/uL   nRBC 0.0 0.0 - 0.2 %    Comment: Performed at [facility], [address] [place], [place] [address]  Magnesium     Status: None   Collection Time: 01/18/19  4:31 AM  Result Value Ref Range   Magnesium 2.0 1.7 - 2.4 mg/dL    Comment: Performed at [facility], [address] [place], [place] [address]   Dg Chest Port 1 View  Result Date: 01/17/2019 CLINICAL DATA:  Cough and dyspnea EXAM: PORTABLE CHEST 1 VIEW COMPARISON:  12/28/2018 FINDINGS: Chronic cardiomegaly. Stable mediastinal contours. Coarse lung markings and reticulonodular opacity at the bases, worse on the left. IMPRESSION: 1. New left lower lobe airspace disease compatible with pneumonia in this setting. 2. Continued right lower lobe infiltrate when compared to 12/28/2018. 3. Recommend follow-up after treatment. Electronically Signed   By: Marnee SpringJonathon  Watts M.D.   On: 01/17/2019 15:03    Pending Labs Unresulted Labs (From admission, onward)    Start     Ordered   01/24/19 0500  Creatinine, serum  (enoxaparin (LOVENOX)    CrCl >/= 30 ml/min)  Weekly,   STAT    Comments:  while on enoxaparin therapy    01/17/19 1644   01/17/19 1643  Culture, sputum-assessment  Once,   STAT     01/17/19 1644   01/17/19 1643  Gram stain  Once,   STAT     01/17/19 1644    01/17/19 1643  HIV antibody (Routine Screening)  Once,   STAT     01/17/19 1644   01/17/19 1643  Strep pneumoniae urinary antigen  Once,   STAT     01/17/19 1644          Vitals/Pain Today's Vitals   01/18/19 0600 01/18/19 0800 01/18/19 0852 01/18/19 0857  BP: (!) 172/62 (!) 171/63    Pulse: 64 66 78 73  Resp:      Temp:      TempSrc:      SpO2: 97% 96% (!) 87% 93%  Weight:      Height:      PainSc:        Isolation Precautions Droplet and Contact precautions  Medications Medications  vancomycin (VANCOCIN) 1,250 mg in sodium chloride 0.9 % 250 mL IVPB (has no administration in time range)  ceFEPIme (MAXIPIME) 2 g in sodium chloride 0.9 % 100 mL IVPB (0 g Intravenous Stopped 01/18/19 0911)  HYDROcodone-acetaminophen (NORCO/VICODIN) 5-325 MG per tablet 1-2 tablet (has no administration in time range)  amLODipine (NORVASC) tablet 5 mg (has no administration in time range)  aspirin EC tablet 81 mg (has no administration in time range)  atorvastatin (LIPITOR) tablet 40 mg (40 mg Oral Given 01/17/19 1919)  sertraline (ZOLOFT) tablet 100 mg (100 mg Oral Given 01/17/19 1919)  tamsulosin (FLOMAX) capsule 0.4 mg (0.4 mg Oral Given 01/17/19 1919)  feeding supplement (ENSURE ENLIVE) (ENSURE ENLIVE) liquid 237 mL (has no administration in time range)  guaiFENesin (MUCINEX) 12 hr tablet 600 mg (0 mg Oral Hold 01/17/19 2242)  enoxaparin (LOVENOX) injection 40 mg (40 mg Subcutaneous Given 01/17/19 1918)  0.9 %  sodium chloride infusion ( Intravenous New Bag/Given 01/17/19 1745)  ceFEPIme (MAXIPIME) 2 g in sodium chloride 0.9 % 100 mL IVPB (0 g Intravenous Stopped 01/17/19 1622)  vancomycin (VANCOCIN) 1,500 mg in sodium chloride 0.9 % 500 mL IVPB (0 mg Intravenous Stopped 01/17/19 1949)    Mobility walks with person assist High fall risk   Focused Assessments Cardiac Assessment Handoff:    Lab Results  Component Value Date   TROPONINI <0.03 01/17/2019   No results found for:  DDIMER Does the Patient currently have chest pain? No     R  Recommendations: See Admitting Provider Note  Report given to:   Additional Notes:

## 2019-01-19 NOTE — TOC Initial Note (Signed)
Transition of Care [facility]) - Initial/Assessment Note    Patient Details  Name: Martin Bean MRN: [id number] Date of Birth: October 05, 1929  [facility]) CM/SW Contact:    Virgel Manifold, RN Phone Number: 01/19/2019, 2:58 PM  Clinical Narrative:  [facility] team consult to assist with disposition after PT has recommended home health. Patient currently lives alone but has supports from daughter [name]. Patient uses a rolling walker in the home. [name] is able to provide transportation as patient is not been able to drive for 3-4 weeks now. Patient has been independent but has lost some of his independence after the last couple of hospitalizations that have occurred per [facility]. Patient refused home health on his last admission. Patient now agreeable and his daughter [name] also highly encourages home health particularly with a social worker included to assist with discharge resources in the community. CMS Medicare.gov Compare Post Acute Care list reviewed with patient and they have no preference of agency. Referral placed with [name] at [place]. Dc orders likely tomorrow per MD. No DME needs. PCP is johnston. Uses [facility]                  Expected Discharge Plan: Home w Home Health Services Barriers to Discharge: Continued Medical Work up   Patient Goals and CMS Choice Patient states their goals for this hospitalization and ongoing recovery are:: to go home and stay out of the hospital CMS Medicare.gov Compare Post Acute Care list provided to:: Patient Choice offered to / list presented to : Patient  Expected Discharge Plan and Services Expected Discharge Plan: Home w Home Health Services   Discharge Planning Services: CM Consult Post Acute Care Choice: Home Health Living arrangements for the past 2 months: Single Family Home                             HH Agency: [facility] Date [facility] Agency Contacted: 01/19/19 Time HH Agency Contacted: 1457 Representative spoke  with at [facility] Agency: cheryl  Prior Living Arrangements/Services Living arrangements for the past 2 months: Single Family Home Lives with:: Self Patient language and need for interpreter reviewed:: Yes Do you feel safe going back to the place where you live?: Yes      Need for Family Participation in Patient Care: No (Comment)   Current home services: DME Criminal Activity/Legal Involvement Pertinent to Current Situation/Hospitalization: Yes - Comment as needed  Activities of Daily Living Home Assistive Devices/Equipment: Walker (specify type) ADL Screening (condition at time of admission) Patient's cognitive ability adequate to safely complete daily activities?: Yes Is the patient deaf or have difficulty hearing?: Yes Does the patient have difficulty seeing, even when wearing glasses/contacts?: No Does the patient have difficulty concentrating, remembering, or making decisions?: No Patient able to express need for assistance with ADLs?: Yes Does the patient have difficulty dressing or bathing?: Yes Independently performs ADLs?: Yes (appropriate for developmental age) Does the patient have difficulty walking or climbing stairs?: Yes Weakness of Legs: Both Weakness of Arms/Hands: Both  Permission Sought/Granted Permission sought to share information with : Case Manager                Emotional Assessment   Attitude/Demeanor/Rapport: Engaged, Ambitious Affect (typically observed): Accepting Orientation: : Oriented to Self, Oriented to Place, Oriented to  Time, Oriented to Situation      Admission diagnosis:  Hypoxia [R09.02] HCAP (healthcare-associated pneumonia) [J18.9] Patient Active Problem  List   Diagnosis Date Noted  . Bilateral pneumonia 01/17/2019  . Community acquired pneumonia 12/28/2018  . Acute renal failure (ARF) (HCC) 12/28/2018  . Pneumonia 12/28/2018  . Hemothorax   . Malnutrition of moderate degree 11/02/2017  . Hydropneumothorax 11/01/2017  . Essential  hypertension 10/09/2017  . Hyperlipidemia 10/09/2017  . Carotid arterial disease (HCC) 10/02/2017  . Carotid artery stenosis, unilateral, left 09/04/2017  . CVA (cerebral vascular accident) (HCC) 08/14/2017  . Benign prostatic hyperplasia with urinary frequency 06/03/2017  . Depression, prolonged 06/03/2017  . 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 Psoriasis, unspecified 06/03/2017   PCP:  [name], [name], MD Pharmacy:   [facility] [address] - [place], [place] - [address] [place] [address] Phone: [phone] Fax: [phone]     Social Determinants of Health (SDOH) Interventions    Readmission Risk Interventions Readmission Risk Prevention Plan 01/19/2019  Transportation Screening Complete  PCP or Specialist Appt within 5-7 Days Complete  Home Care Screening Complete  Medication Review (RN CM) Complete  Some recent data might be hidden

## 2019-01-19 NOTE — Progress Notes (Signed)
[facility] [place] [facility]   PATIENT NAME: Martin Bean    MR#:  [id number]  DATE OF BIRTH:  1930-05-20  SUBJECTIVE:  CHIEF COMPLAINT:   Chief Complaint  Patient presents with  . Shortness of Breath   -Weaned off oxygen now.  Still feels short of breath with minimal exertion.  REVIEW OF SYSTEMS:  Review of Systems  Constitutional: Positive for malaise/fatigue. Negative for chills and fever.  HENT: Negative for congestion, ear discharge, hearing loss and nosebleeds.   Eyes: Negative for blurred vision and double vision.  Respiratory: Positive for shortness of breath. Negative for cough and wheezing.   Cardiovascular: Negative for chest pain and palpitations.  Gastrointestinal: Negative for abdominal pain, constipation, diarrhea, nausea and vomiting.  Genitourinary: Negative for dysuria.  Musculoskeletal: Negative for myalgias.  Neurological: Negative for dizziness, focal weakness, seizures, weakness and headaches.  Psychiatric/Behavioral: Negative for depression.    DRUG ALLERGIES:  No Known Allergies  VITALS:  Blood pressure 140/71, pulse 69, temperature 98.4 F (36.9 C), temperature source Oral, resp. rate 16, height 5\' 11"  (1.803 m), weight 67.3 kg, SpO2 96 %.  PHYSICAL EXAMINATION:  Physical Exam   GENERAL:  85 y.o.-year-old elderly patient lying in the bed with no acute distress.  EYES: Pupils equal, round, reactive to light and accommodation. No scleral icterus. Extraocular muscles intact.  HEENT: Head atraumatic, normocephalic. Oropharynx and nasopharynx clear.  NECK:  Supple, no jugular venous distention. No thyroid enlargement, no tenderness.  LUNGS: Improved rhonchorous breath sounds bilaterally, no wheezing, rales  or crepitation. No use of accessory muscles of respiration.  Decreased breath sounds at the bases CARDIOVASCULAR: S1, S2 normal. No  rubs, or gallops.  3/6 systolic murmur is present ABDOMEN: Soft, nontender,  nondistended. Bowel sounds present. No organomegaly or mass.  EXTREMITIES: No pedal edema, cyanosis, or clubbing.  NEUROLOGIC: Cranial nerves II through XII are intact. Muscle strength 5/5 in all extremities. Sensation intact. Gait not checked.  Overall weakness noted PSYCHIATRIC: The patient is alert and oriented x 3.  SKIN: No obvious rash, lesion, or ulcer.    LABORATORY PANEL:   CBC Recent Labs  Lab 01/18/19 0431  WBC 20.8*  HGB 8.8*  HCT 26.8*  PLT 281   ------------------------------------------------------------------------------------------------------------------  Chemistries  Recent Labs  Lab 01/17/19 1414 01/18/19 0431  NA 136 139  K 3.9 3.6  CL 104 108  CO2 23 24  GLUCOSE 192* 116*  BUN 14 14  CREATININE 0.81 0.72  CALCIUM 8.1* 7.8*  MG  --  2.0  AST 21  --   ALT 17  --   ALKPHOS 143*  --   BILITOT 1.0  --    ------------------------------------------------------------------------------------------------------------------  Cardiac Enzymes Recent Labs  Lab 01/17/19 1414  TROPONINI <0.03   ------------------------------------------------------------------------------------------------------------------  RADIOLOGY:  Dg Chest Port 1 View  Result Date: 01/17/2019 CLINICAL DATA:  Cough and dyspnea EXAM: PORTABLE CHEST 1 VIEW COMPARISON:  12/28/2018 FINDINGS: Chronic cardiomegaly. Stable mediastinal contours. Coarse lung markings and reticulonodular opacity at the bases, worse on the left. IMPRESSION: 1. New left lower lobe airspace disease compatible with pneumonia in this setting. 2. Continued right lower lobe infiltrate when compared to 12/28/2018. 3. Recommend follow-up after treatment. Electronically Signed   By: Marnee Spring M.D.   On: 01/17/2019 15:03    EKG:   Orders placed or performed during the hospital encounter of 01/17/19  . ED EKG 12-Lead  . ED EKG 12-Lead    ASSESSMENT AND PLAN:  85 year old male with past medical history  significant for stroke, hypertension, BPH, diastolic CHF who was in the hospital 2 weeks ago for pneumonia got discharged and comes back with worsening shortness of breath and cough.  1.  Healthcare acquired pneumonia-chest x-ray showing new left-sided pneumonia and continued right-sided infiltrate from last admission. -Follow blood cultures.  On vancomycin and cefepime for now -Gentle hydration.   -Weaned off oxygen.  DC vancomycin -Incentive spirometer  2.  Hypertension-continue Norvasc.  PRN hydralazine for elevated blood pressure  3.  Hyperlipidemia-on statin  4.  BPH-Flomax  5.  DVT prophylaxis-Lovenox  Physical therapy consult -patient will need home health at discharge Updated the daughters over the phone yesterday   All the records are reviewed and case discussed with Care Management/Social Workerr. Management plans discussed with the patient, family and they are in agreement.  CODE STATUS: Full code  TOTAL TIME TAKING CARE OF THIS PATIENT: 33 minutes.   POSSIBLE D/C IN 1-2 DAYS, DEPENDING ON CLINICAL CONDITION.   Enid Baasadhika Ruie Sendejo M.D on 01/19/2019 at 12:01 PM  Between 7am to 6pm - Pager - [phone]  After 6pm go to [email or web address] - password [email or web address]  [facility]  Office  [phone]  CC: Primary care physician; [name], [name], MD

## 2019-01-19 NOTE — Progress Notes (Signed)
Daughter called for additional update. Update given.   Madie Reno, RN

## 2019-01-19 NOTE — Evaluation (Signed)
Physical Therapy Evaluation Patient Details Name: Martin Bean MRN: [id number] DOB: 22-Aug-1929 Today's Date: 01/19/2019   History of Present Illness  Pt is an 85 y.o. male presenting to hospital 01/17/19 with SOB and cough; recent treatment for PNA about 2 weeks prior. (-) COVID test.  Pt admitted with hospital acquired PNA B with hypoxia.  PMH includes CVA, depression, HOH, htn, psoriasis, diastolic heart failure, hemothorax.  Clinical Impression  Prior to hospital admission, pt was modified independent ambulating with SPC (use of RW as needed).  Pt lives alone in 1 level home with 5 steps (B rails) to enter; has family support.  Currently pt is modified independent semi-supine to sit; CGA to SBA with transfers; and CGA to ambulate 120 feet with RW.  Overall pt steady with RW but limited distance d/t SOB.  O2 sats 91% or greater on room air with activity during session.  Pt would benefit from skilled PT to address noted impairments and functional limitations (see below for any additional details).  Upon hospital discharge, recommend pt discharge to HHPT to improve strength and activity tolerance.    Follow Up Recommendations Home health PT    Equipment Recommendations  (pt has own walker at home already)    Recommendations for Other Services       Precautions / Restrictions Precautions Precautions: Fall Restrictions Weight Bearing Restrictions: No      Mobility  Bed Mobility Overal bed mobility: Modified Independent             General bed mobility comments: Semi-supine to sit without any noted difficulties  Transfers Overall transfer level: Needs assistance Equipment used: Rolling walker (2 wheeled) Transfers: Sit to/from Stand Sit to Stand: Min guard;Supervision         General transfer comment: x1 trial from bed; x1 trial from toilet (use of grab bar in bathroom); x1 trial from recliner; fairly strong stand noted with use of RW  Ambulation/Gait Ambulation/Gait  assistance: Min guard Gait Distance (Feet): (20 feet x2 (to bathroom and back); 120 feet) Assistive device: Rolling walker (2 wheeled) Gait Pattern/deviations: Step-through pattern Gait velocity: mildly decreased   General Gait Details: steady ambulation with RW  Stairs            Wheelchair Mobility    Modified Rankin (Stroke Patients Only)       Balance Overall balance assessment: Needs assistance Sitting-balance support: No upper extremity supported;Feet supported Sitting balance-Leahy Scale: Normal Sitting balance - Comments: steady sitting reaching outside BOS   Standing balance support: No upper extremity supported Standing balance-Leahy Scale: Good Standing balance comment: steady standing washing hands at sink                             Pertinent Vitals/Pain Pain Assessment: No/denies pain  HR stable and WFL throughout treatment session.    Home Living Family/patient expects to be discharged to:: Private residence Living Arrangements: Alone Available Help at Discharge: Family;Available PRN/intermittently(Family checks in on pt and assists pt with meals and errands several times a week as needed.) Type of Home: House Home Access: Stairs to enter Entrance Stairs-Rails: Right;Left;Can reach both Entrance Stairs-Number of Steps: 5 Home Layout: One level Home Equipment: Walker - 2 wheels;Cane - single point      Prior Function Level of Independence: Independent with assistive device(s)         Comments: Modified independent with SPC; used RW as needed.  H/o fall 3-4 weeks ago (  pt was dizzy and legs gave out when going to get water in kitchen)     Hand Dominance        Extremity/Trunk Assessment   Upper Extremity Assessment Upper Extremity Assessment: Generalized weakness    Lower Extremity Assessment Lower Extremity Assessment: Generalized weakness    Cervical / Trunk Assessment Cervical / Trunk Assessment: Normal   Communication   Communication: No difficulties  Cognition Arousal/Alertness: Awake/alert Behavior During Therapy: WFL for tasks assessed/performed Overall Cognitive Status: Within Functional Limits for tasks assessed                                        General Comments   Nursing cleared pt for participation in physical therapy.  Pt agreeable to PT session.    Exercises  Ambulation   Assessment/Plan    PT Assessment Patient needs continued PT services  PT Problem List Decreased strength;Decreased activity tolerance;Decreased balance;Decreased mobility;Cardiopulmonary status limiting activity       PT Treatment Interventions DME instruction;Gait training;Stair training;Functional mobility training;Therapeutic activities;Therapeutic exercise;Balance training;Patient/family education    PT Goals (Current goals can be found in the Care Plan section)  Acute Rehab PT Goals Patient Stated Goal: to return home PT Goal Formulation: With patient Time For Goal Achievement: 01/26/19 Potential to Achieve Goals: Good    Frequency Min 2X/week   Barriers to discharge        Co-evaluation               AM-PAC PT "6 Clicks" Mobility  Outcome Measure Help needed turning from your back to your side while in a flat bed without using bedrails?: None Help needed moving from lying on your back to sitting on the side of a flat bed without using bedrails?: None Help needed moving to and from a bed to a chair (including a wheelchair)?: A Little Help needed standing up from a chair using your arms (e.g., wheelchair or bedside chair)?: A Little Help needed to walk in hospital room?: A Little Help needed climbing 3-5 steps with a railing? : A Little 6 Click Score: 20    End of Session Equipment Utilized During Treatment: Gait belt Activity Tolerance: Patient tolerated treatment well Patient left: in chair;with call bell/phone within reach;with chair alarm set Nurse  Communication: Mobility status;Precautions;Other (comment)(pt's O2 sats during session) PT Visit Diagnosis: Muscle weakness (generalized) (M62.81);History of falling (Z91.81);Difficulty in walking, not elsewhere classified (R26.2)    Time: [id number] PT Time Calculation (min) (ACUTE ONLY): 40 min   Charges:   PT Evaluation $PT Eval Low Complexity: 1 Low PT Treatments $Therapeutic Exercise: 8-22 mins       Hendricks LimesEmily Camry Theiss, PT 01/19/19, 11:07 AM [phone]

## 2019-01-19 NOTE — Progress Notes (Signed)
Daughter [name] called for update. Update given.   Madie Reno, RN

## 2019-01-19 NOTE — Progress Notes (Signed)
Educated patient how to properly use the incentive spirometer. Patient demonstrated very well. Patient was able to do 10 times up to 750. Encouraged patient to repeat 10 times per hour.  Madie Reno, RN

## 2019-01-20 LAB — CBC
HCT: 27.2 % — ABNORMAL LOW (ref 39.0–52.0)
Hemoglobin: 8.8 g/dL — ABNORMAL LOW (ref 13.0–17.0)
MCH: 27.8 pg (ref 26.0–34.0)
MCHC: 32.4 g/dL (ref 30.0–36.0)
MCV: 86.1 fL (ref 80.0–100.0)
Platelets: 280 10*3/uL (ref 150–400)
RBC: 3.16 MIL/uL — ABNORMAL LOW (ref 4.22–5.81)
RDW: 17.1 % — ABNORMAL HIGH (ref 11.5–15.5)
WBC: 11.6 10*3/uL — ABNORMAL HIGH (ref 4.0–10.5)
nRBC: 0 % (ref 0.0–0.2)

## 2019-01-20 LAB — HIV ANTIBODY (ROUTINE TESTING W REFLEX): HIV Screen 4th Generation wRfx: NONREACTIVE

## 2019-01-20 LAB — BASIC METABOLIC PANEL
Anion gap: 6 (ref 5–15)
BUN: 11 mg/dL (ref 8–23)
CO2: 24 mmol/L (ref 22–32)
Calcium: 7.7 mg/dL — ABNORMAL LOW (ref 8.9–10.3)
Chloride: 107 mmol/L (ref 98–111)
Creatinine, Ser: 0.65 mg/dL (ref 0.61–1.24)
GFR calc Af Amer: >60 mL/min (ref >60)
GFR calc non Af Amer: >60 mL/min (ref >60)
Glucose, Bld: 120 mg/dL — ABNORMAL HIGH (ref 70–99)
Potassium: 3.5 mmol/L (ref 3.5–5.1)
Sodium: 137 mmol/L (ref 135–145)

## 2019-01-20 MED ORDER — AMOXICILLIN-POT CLAVULANATE 875-125 MG PO TABS: 1.0000 | ORAL_TABLET | Freq: Two times a day (BID) | ORAL | 0 refills | Status: AC

## 2019-01-20 NOTE — Care Management Important Message (Signed)
Important Message  Patient Details  Name: Martin Bean MRN: [id number] Date of Birth: August 12, 1930   Medicare Important Message Given:  Other (see comment)  Initial Medicare IM given by Patient Access Associate on 01/19/19 at 1043.  Still valid.  [name] 01/20/2019, 8:35 AM

## 2019-01-20 NOTE — Progress Notes (Signed)
01/20/2019 3:55 PM  Martin Bean to be D/C'd Home per MD order.  Discussed prescriptions and follow up appointments with the patient. Prescriptions given to patient, medication list explained in detail. Pt verbalized understanding.  Allergies as of 01/20/2019   No Known Allergies     Medication List    STOP taking these medications   carvedilol 3.125 MG tablet Commonly known as:  Coreg   HYDROcodone-acetaminophen 5-325 MG tablet Commonly known as:  NORCO/VICODIN   levofloxacin 750 MG tablet Commonly known as:  LEVAQUIN     TAKE these medications   amLODipine 5 MG tablet Commonly known as:  NORVASC Take 1 tablet (5 mg total) by mouth daily. Notes to patient:  Morning 01/21/19   amoxicillin-clavulanate 875-125 MG tablet Commonly known as:  Augmentin Take 1 tablet by mouth every 12 (twelve) hours for 10 days. Notes to patient:  Before bed 01/20/19   aspirin EC 81 MG tablet Take 81 mg by mouth daily. Notes to patient:  Morning 01/21/19   atorvastatin 40 MG tablet Commonly known as:  LIPITOR Take 1 tablet (40 mg total) by mouth daily at 6 PM. Notes to patient:  6PM 01/20/19   cyanocobalamin 1000 MCG/ML injection Commonly known as:  (VITAMIN B-12) Inject into the muscle.   feeding supplement (ENSURE ENLIVE) Liqd Take 237 mLs by mouth 2 (two) times daily between meals. Notes to patient:  Morning 01/21/19   guaiFENesin 600 MG 12 hr tablet Commonly known as:  Mucinex Take 1 tablet (600 mg total) by mouth 2 (two) times daily. Notes to patient:  Before bed 01/20/19   sertraline 100 MG tablet Commonly known as:  ZOLOFT Take 100 mg by mouth daily. Notes to patient:  Morning 01/21/19   tamsulosin 0.4 MG Caps capsule Commonly known as:  FLOMAX Take 0.4 mg by mouth daily. Notes to patient:  Morning 01/21/19       Vitals:   01/20/19 0440 01/20/19 1226  BP: (!) 154/79 (!) 169/67  Pulse: 72 70  Resp: 20 17  Temp: 98.4 F (36.9 C) 98.3 F (36.8 C)  SpO2: 93% 97%    Skin  clean, dry and intact without evidence of skin break down, no evidence of skin tears noted. IV catheter discontinued intact. Site without signs and symptoms of complications. Dressing and pressure applied. Pt denies pain at this time. No complaints noted.  An After Visit Summary was printed and given to the patient. Patient escorted via WC, and D/C home via private auto.  Bradly Chris

## 2019-01-20 NOTE — Discharge Summary (Signed)
[employer] [facility] [place] at [facility]   PATIENT NAME: Martin Bean    MR#:  [id number]  DATE OF BIRTH:  [birth year]  DATE OF ADMISSION:  01/17/2019   ADMITTING PHYSICIAN: Jama Flavors, MD  DATE OF DISCHARGE:  01/20/19  PRIMARY CARE PHYSICIAN: Gracelyn Nurse, MD   ADMISSION DIAGNOSIS:   Hypoxia [R09.02] HCAP (healthcare-associated pneumonia) [J18.9]  DISCHARGE DIAGNOSIS:   Active Problems:   Bilateral pneumonia   SECONDARY DIAGNOSIS:   Past Medical History:  Diagnosis Date   Carotid arterial disease (HCC) 10/02/2017   CVA (cerebral vascular accident) (HCC) 08/14/2017   Depression    Diastolic heart failure (HCC)    Family history of adverse reaction to anesthesia    HOH (hard of hearing)    HTN (hypertension)    Hydropneumothorax 11/01/2017   Hyperlipidemia 10/09/2017   Malnutrition of moderate degree 11/02/2017   Pneumonia    Prostate enlargement    Psoriasis     HOSPITAL COURSE:   85 year old male with past medical history significant for stroke, hypertension, BPH, diastolic CHF who was in the hospital 2 weeks ago for pneumonia got discharged and comes back with worsening shortness of breath and cough.  1.  Healthcare acquired pneumonia-chest x-ray showing new left-sided pneumonia and continued right-sided infiltrate from last admission. -His WBC has significantly improved from 20.8K to 11.6K -blood cultures negative.  On vancomycin and cefepime here in the hospital. -Received gentle hydration.  Off oxygen now. -Incentive spirometer -Being discharged on Augmentin  2.  Hypertension-continue Norvasc.   3.  Hyperlipidemia-on statin  4.  BPH-Flomax   Physical therapy consulted, will be discharged today with home health  DISCHARGE CONDITIONS:   Guarded  CONSULTS OBTAINED:   None  DRUG ALLERGIES:   No Known Allergies DISCHARGE MEDICATIONS:   Allergies as of 01/20/2019   No Known Allergies     Medication List      STOP taking these medications   carvedilol 3.125 MG tablet Commonly known as:  Coreg   HYDROcodone-acetaminophen 5-325 MG tablet Commonly known as:  NORCO/VICODIN   levofloxacin 750 MG tablet Commonly known as:  LEVAQUIN     TAKE these medications   amLODipine 5 MG tablet Commonly known as:  NORVASC Take 1 tablet (5 mg total) by mouth daily.   amoxicillin-clavulanate 875-125 MG tablet Commonly known as:  Augmentin Take 1 tablet by mouth every 12 (twelve) hours for 10 days.   aspirin EC 81 MG tablet Take 81 mg by mouth daily.   atorvastatin 40 MG tablet Commonly known as:  LIPITOR Take 1 tablet (40 mg total) by mouth daily at 6 PM.   cyanocobalamin 1000 MCG/ML injection Commonly known as:  (VITAMIN B-12) Inject into the muscle.   feeding supplement (ENSURE ENLIVE) Liqd Take 237 mLs by mouth 2 (two) times daily between meals.   guaiFENesin 600 MG 12 hr tablet Commonly known as:  Mucinex Take 1 tablet (600 mg total) by mouth 2 (two) times daily.   sertraline 100 MG tablet Commonly known as:  ZOLOFT Take 100 mg by mouth daily.   tamsulosin 0.4 MG Caps capsule Commonly known as:  FLOMAX Take 0.4 mg by mouth daily.        DISCHARGE INSTRUCTIONS:   1. PCP f/u in 1-2 weeks  DIET:   Cardiac diet  ACTIVITY:   Activity as tolerated  OXYGEN:   Home Oxygen: No.  Oxygen Delivery: room air  DISCHARGE LOCATION:   home   If you experience  worsening of your admission symptoms, develop shortness of breath, life threatening emergency, suicidal or homicidal thoughts you must seek medical attention immediately by calling 911 or calling your MD immediately  if symptoms less severe.  You Must read complete instructions/literature along with all the possible adverse reactions/side effects for all the Medicines you take and that have been prescribed to you. Take any new Medicines after you have completely understood and accpet all the possible adverse reactions/side  effects.   Please note  You were cared for by a hospitalist during your hospital stay. If you have any questions about your discharge medications or the care you received while you were in the hospital after you are discharged, you can call the unit and asked to speak with the hospitalist on call if the hospitalist that took care of you is not available. Once you are discharged, your primary care physician will handle any further medical issues. Please note that NO REFILLS for any discharge medications will be authorized once you are discharged, as it is imperative that you return to your primary care physician (or establish a relationship with a primary care physician if you do not have one) for your aftercare needs so that they can reassess your need for medications and monitor your lab values.    On the day of Discharge:  VITAL SIGNS:   Blood pressure (!) 169/67, pulse 70, temperature 98.3 F (36.8 C), resp. rate 17, height 5\' 11"  (1.803 m), weight 67.3 kg, SpO2 97 %.  PHYSICAL EXAMINATION:    GENERAL:  85 y.o.-year-old elderly patient lying in the bed with no acute distress.  EYES: Pupils equal, round, reactive to light and accommodation. No scleral icterus. Extraocular muscles intact.  HEENT: Head atraumatic, normocephalic. Oropharynx and nasopharynx clear.  NECK:  Supple, no jugular venous distention. No thyroid enlargement, no tenderness.  LUNGS: Improved rhonchorous breath sounds bilaterally, no wheezing, rales  or crepitation. No use of accessory muscles of respiration.  Decreased breath sounds at the bases CARDIOVASCULAR: S1, S2 normal. No  rubs, or gallops.  3/6 systolic murmur is present ABDOMEN: Soft, nontender, nondistended. Bowel sounds present. No organomegaly or mass.  EXTREMITIES: No pedal edema, cyanosis, or clubbing.  NEUROLOGIC: Cranial nerves II through XII are intact. Muscle strength 5/5 in all extremities. Sensation intact. Gait not checked.  Overall weakness  noted PSYCHIATRIC: The patient is alert and oriented x 3.  SKIN: No obvious rash, lesion, or ulcer.   DATA REVIEW:   CBC Recent Labs  Lab 01/20/19 0314  WBC 11.6*  HGB 8.8*  HCT 27.2*  PLT 280    Chemistries  Recent Labs  Lab 01/17/19 1414 01/18/19 0431 01/20/19 0314  NA 136 139 137  K 3.9 3.6 3.5  CL 104 108 107  CO2 23 24 24   GLUCOSE 192* 116* 120*  BUN 14 14 11   CREATININE 0.81 0.72 0.65  CALCIUM 8.1* 7.8* 7.7*  MG  --  2.0  --   AST 21  --   --   ALT 17  --   --   ALKPHOS 143*  --   --   BILITOT 1.0  --   --      Microbiology Results  Results for orders placed or performed during the hospital encounter of 01/17/19  Blood Culture (routine x 2)     Status: None (Preliminary result)   Collection Time: 01/17/19  2:14 PM  Result Value Ref Range Status   Specimen Description BLOOD RAC  Final  Special Requests   Final    BOTTLES DRAWN AEROBIC AND ANAEROBIC Blood Culture adequate volume   Culture   Final    NO GROWTH 3 DAYS Performed at [facility], [address] [place], [place] [address]    Report Status PENDING  Incomplete  Blood Culture (routine x 2)     Status: None (Preliminary result)   Collection Time: 01/17/19  2:14 PM  Result Value Ref Range Status   Specimen Description BLOOD LAC  Final   Special Requests   Final    BOTTLES DRAWN AEROBIC AND ANAEROBIC Blood Culture adequate volume   Culture   Final    NO GROWTH 3 DAYS Performed at [facility], [address] [place], [place] [address]    Report Status PENDING  Incomplete  SARS Coronavirus 2 (CEPHEID- Performed in [facility] hospital lab), Hosp Order     Status: None   Collection Time: 01/17/19  2:14 PM  Result Value Ref Range Status   SARS Coronavirus 2 NEGATIVE NEGATIVE Final    Comment: (NOTE) If result is NEGATIVE SARS-CoV-2 target nucleic acids are NOT DETECTED. The SARS-CoV-2 RNA is generally detectable in upper and lower  respiratory specimens during the  acute phase of infection. The lowest  concentration of SARS-CoV-2 viral copies this assay can detect is 250  copies / mL. A negative result does not preclude SARS-CoV-2 infection  and should not be used as the sole basis for treatment or other  patient management decisions.  A negative result may occur with  improper specimen collection / handling, submission of specimen other  than nasopharyngeal swab, presence of viral mutation(s) within the  areas targeted by this assay, and inadequate number of viral copies  (<250 copies / mL). A negative result must be combined with clinical  observations, patient history, and epidemiological information. If result is POSITIVE SARS-CoV-2 target nucleic acids are DETECTED. The SARS-CoV-2 RNA is generally detectable in upper and lower  respiratory specimens dur ing the acute phase of infection.  Positive  results are indicative of active infection with SARS-CoV-2.  Clinical  correlation with patient history and other diagnostic information is  necessary to determine patient infection status.  Positive results do  not rule out bacterial infection or co-infection with other viruses. If result is PRESUMPTIVE POSTIVE SARS-CoV-2 nucleic acids MAY BE PRESENT.   A presumptive positive result was obtained on the submitted specimen  and confirmed on repeat testing.  While 2019 novel coronavirus  (SARS-CoV-2) nucleic acids may be present in the submitted sample  additional confirmatory testing may be necessary for epidemiological  and / or clinical management purposes  to differentiate between  SARS-CoV-2 and other Sarbecovirus currently known to infect humans.  If clinically indicated additional testing with an alternate test  methodology [phone]([id number]) is advised. The SARS-CoV-2 RNA is generally  detectable in upper and lower respiratory sp ecimens during the acute  phase of infection. The expected result is Negative. Fact Sheet for Patients:   BoilerBrush.com.cyhttps://www.fda.gov/media/136312/download Fact Sheet for Healthcare Providers: https://pope.com/https://www.fda.gov/media/136313/download This test is not yet approved or cleared by the [place] FDA and has been authorized for detection and/or diagnosis of SARS-CoV-2 by FDA under an Emergency Use Authorization (EUA).  This EUA will remain in effect (meaning this test can be used) for the duration of the COVID-19 declaration under Section 564(b)(1) of the Act, 21 U.S.C. section 360bbb-3(b)(1), unless the authorization is terminated or revoked sooner. Performed at [facility], [address]  [place], [place] [address]   Blood Culture ID Panel (Reflexed)     Status: None   Collection Time: 01/17/19  2:14 PM  Result Value Ref Range Status   Enterococcus species NOT DETECTED NOT DETECTED Final   Listeria monocytogenes NOT DETECTED NOT DETECTED Final   Staphylococcus species NOT DETECTED NOT DETECTED Final   Staphylococcus aureus (BCID) NOT DETECTED NOT DETECTED Final   Streptococcus species NOT DETECTED NOT DETECTED Final   Streptococcus agalactiae NOT DETECTED NOT DETECTED Final   Streptococcus pneumoniae NOT DETECTED NOT DETECTED Final   Streptococcus pyogenes NOT DETECTED NOT DETECTED Final   Acinetobacter baumannii NOT DETECTED NOT DETECTED Final   Enterobacteriaceae species NOT DETECTED NOT DETECTED Final   Enterobacter cloacae complex NOT DETECTED NOT DETECTED Final   Escherichia coli NOT DETECTED NOT DETECTED Final   Klebsiella oxytoca NOT DETECTED NOT DETECTED Final   Klebsiella pneumoniae NOT DETECTED NOT DETECTED Final   Proteus species NOT DETECTED NOT DETECTED Final   Serratia marcescens NOT DETECTED NOT DETECTED Final   Haemophilus influenzae NOT DETECTED NOT DETECTED Final   Neisseria meningitidis NOT DETECTED NOT DETECTED Final   Pseudomonas aeruginosa NOT DETECTED NOT DETECTED Final   Candida albicans NOT DETECTED NOT DETECTED Final   Candida glabrata NOT DETECTED NOT  DETECTED Final   Candida krusei NOT DETECTED NOT DETECTED Final   Candida parapsilosis NOT DETECTED NOT DETECTED Final   Candida tropicalis NOT DETECTED NOT DETECTED Final    Comment: Performed at [facility], [address] [place], [place] [address]    RADIOLOGY:  No results found.   Management plans discussed with the patient, family and they are in agreement.  CODE STATUS:     Code Status Orders  (From admission, onward)         Start     Ordered   01/17/19 1644  Full code  Continuous     01/17/19 1644        Code Status History    Date Active Date Inactive Code Status Order ID Comments User Context   12/28/2018 1840 12/30/2018 1913 Full Code [id number]  Altamese Dilling, MD ED   11/01/2017 1225 11/06/2017 1702 Full Code [id number]  Ihor Austin, MD Inpatient   10/02/2017 1525 10/03/2017 1756 Full Code [id number]  Annice Needy, MD Inpatient   08/14/2017 0935 08/15/2017 2214 Full Code [id number]  Milagros Loll, MD ED    Advance Directive Documentation     Most Recent Value  Type of Advance Directive  Healthcare Power of Attorney  Pre-existing out of facility DNR order (yellow form or pink MOST form)  --  "MOST" Form in Place?  --      TOTAL TIME TAKING CARE OF THIS PATIENT: 38 minutes.    Enid Baas M.D on 01/20/2019 at 1:31 PM  Between 7am to 6pm - Pager - [phone]  After 6pm go to [email or web address] - Social research officer, government  [facility] [place] [facility]  Office  [phone]  CC: Primary care physician; Gracelyn Nurse, MD   Note: This dictation was prepared with Dragon dictation along with smaller phrase technology. Any transcriptional errors that result from this process are unintentional.

## 2019-01-20 NOTE — TOC Transition Note (Signed)
Transition of Care [facility]) - CM/SW Discharge Note   Patient Details  Name: Martin Bean MRN: [id number] Date of Birth: 09-13-1929  [facility] CM/SW Contact:  Chapman Fitch, RN Phone Number: 01/20/2019, 2:52 PM   Clinical Narrative:    Patient to discharge home today.  [name] with [facility] notified of discharge.  Family to transport at discharge.    Final next level of care: Home w Home Health Services Barriers to Discharge: Barriers Resolved   Patient Goals and CMS Choice Patient states their goals for this hospitalization and ongoing recovery are:: to go home and stay out of the hospital CMS Medicare.gov Compare Post Acute Care list provided to:: Patient Choice offered to / list presented to : Patient  Discharge Placement                       Discharge Plan and Services   Discharge Planning Services: CM Consult Post Acute Care Choice: Home Health                    HH Arranged: RN, PT, Social Work, Nurse's Aide HH Agency: [facility] Date HH Agency Contacted: 01/20/19 Time HH Agency Contacted: 1452 Representative spoke with at [facility] Agency: [name]  Social Determinants of Health (SDOH) Interventions     Readmission Risk Interventions Readmission Risk Prevention Plan 01/19/2019  Transportation Screening Complete  PCP or Specialist Appt within 5-7 Days Complete  Home Care Screening Complete  Medication Review (RN CM) Complete  Some recent data might be hidden

## 2019-01-22 LAB — CULTURE, BLOOD (ROUTINE X 2)
Culture: NO GROWTH
Culture: NO GROWTH
Special Requests: ADEQUATE
Special Requests: ADEQUATE

## 2019-01-26 ENCOUNTER — Other Ambulatory Visit: Payer: Self-pay | Admitting: Internal Medicine

## 2019-01-26 DIAGNOSIS — J690 Pneumonitis due to inhalation of food and vomit: Secondary | ICD-10-CM

## 2019-01-28 ENCOUNTER — Ambulatory Visit
Admission: RE | Admit: 2019-01-28 | Discharge: 2019-01-28 | Disposition: A | Discharge status: Home / Self Care | Payer: Medicare HMO | Source: Ambulatory Visit | Attending: Internal Medicine | Admitting: Internal Medicine

## 2019-01-28 ENCOUNTER — Other Ambulatory Visit: Payer: Self-pay

## 2019-01-28 DIAGNOSIS — R1311 Dysphagia, oral phase: Secondary | ICD-10-CM | POA: Insufficient documentation

## 2019-01-28 DIAGNOSIS — J690 Pneumonitis due to inhalation of food and vomit: Secondary | ICD-10-CM | POA: Insufficient documentation

## 2019-01-28 NOTE — Therapy (Addendum)
[facility] DIAGNOSTIC RADIOLOGY [address] [place], [place], [address] Phone: [phone]   Fax:     Modified Barium Swallow  Patient Details  Name: Martin HaverJasper L Caffee MRN: [id number] Date of Birth: 05/03/1930 No data recorded  Encounter Date: 01/28/2019  End of Session - 01/28/19 1642    Visit Number  1    Number of Visits  1    Date for SLP Re-Evaluation  01/28/19    SLP Start Time  1330    SLP Stop Time   1430    SLP Time Calculation (min)  60 min    Activity Tolerance  Patient tolerated treatment well       Past Medical History:  Diagnosis Date  . Carotid arterial disease (HCC) 10/02/2017  . CVA (cerebral vascular accident) (HCC) 08/14/2017  . Depression   . Diastolic heart failure (HCC)   . Family history of adverse reaction to anesthesia   . HOH (hard of hearing)   . HTN (hypertension)   . Hydropneumothorax 11/01/2017  . Hyperlipidemia 10/09/2017  . Malnutrition of moderate degree 11/02/2017  . Pneumonia   . Prostate enlargement   . Psoriasis     Past Surgical History:  Procedure Laterality Date  . CATARACT EXTRACTION W/PHACO Right 02/25/2018   Procedure: CATARACT EXTRACTION PHACO AND INTRAOCULAR LENS PLACEMENT (IOC);  Surgeon: Galen ManilaPorfilio, William, MD;  Location: [facility] ORS;  Service: Ophthalmology;  Laterality: Right;  US 01:03.2 AP% 17.1 CDE 10.83 Fluid Pack lot #[id number]  . CATARACT EXTRACTION W/PHACO Left 03/18/2018   Procedure: CATARACT EXTRACTION PHACO AND INTRAOCULAR LENS PLACEMENT (IOC);  Surgeon: Galen ManilaPorfilio, William, MD;  Location: [facility] ORS;  Service: Ophthalmology;  Laterality: Left;  US 1.11 AP% 16.7 CDE 11.90 Fluid pack lot # [id number]  . ENDARTERECTOMY Left 10/02/2017   Procedure: ENDARTERECTOMY CAROTID;  Surgeon: Annice Needyew, Jason S, MD;  Location: [facility] ORS;  Service: Vascular;  Laterality: Left;  . HERNIA REPAIR      There were no vitals filed for this visit.      Subjective: Patient behavior: (alertness, ability to  follow instructions, etc.): pt denied any specific swallowing problems or issues; denied any s/s of Reflux. He endorsed having pneumonia "3x" recently but felt 1 occurrence was d/t "leaving the hospital too soon" since the reoccurrence was so close to the previous one. Pt denied any swallowing problems post CVA ~2 yrs ago. He stated he has begun "pureeing my foods" for easier eating/mastication d/t Edentulous status. He also endorsed drinking Ensure as a Supplement but noted the "cost of it is high".  OM exam WFL; Edentulous.  Chief complaint: dysphagia    Objective:  Radiological Procedure: A videoflouroscopic evaluation of oral-preparatory, reflex initiation, and pharyngeal phases of the swallow was performed; as well as a screening of the upper esophageal phase.  I. POSTURE: upright II. VIEW: lateral  III. COMPENSATORY STRATEGIES: f/u, dry swallow to clear any potential pharyngeal residue(at pyriform sinuses) IV. BOLUSES ADMINISTERED:  Thin Liquid: 6 trials  Nectar-thick Liquid: 1 trial  Honey-thick Liquid: NT  Puree: 3 trials  Mechanical Soft: NT d/t not pt's baseline diet at home per his report V. RESULTS OF EVALUATION: A. ORAL PREPARATORY PHASE: (The lips, tongue, and velum are observed for strength and coordination)       **Overall Severity Rating: [facility] the trials assessed today. Pt reported he purees his foods at home "now" d/t the difficulty of gumming/chewing foods w/out Dentition as well as the effort to "fully get them swallowed"  identifying bread. W/ the trials of thin and Nectar liquids and purees, pt exhibited timely, adequate bolus management for A-P transfer and overall oral clearing was achieved post swallow.   B. SWALLOW INITIATION/REFLEX: (The reflex is normal if "triggered" by the time the bolus reached the base of the tongue)  **Overall Severity Rating: [facility]. Pt appeared to exhibit a timely pharyngeal swallow initiation w/ all trials including thin liquids. The pharyngeal  swallow appeared to trigger as boluses reached the Valleculae and/or minimally spilled from the Valleculae - this is adequate for pt's age.   C. PHARYNGEAL PHASE: (Pharyngeal function is normal if the bolus shows rapid, smooth, and continuous transit through the pharynx and there is no pharyngeal residue after the swallow)  **Overall Severity Rating: grossly WFL. Pt exhibited a slight-min amount of bolus residue post swallow just above the UES/bottom of the pyriform sinuses which he cleared w/ a f/u, Dry swallow when instructed. It appeared the bolus did not completely clear the UES thus a slight amount of residue remained above. This was not consistent w/ liquid consistencies but moreso w/ food consistency(trials). Pt appeared to exhibit fairly efficient laryngeal excursion and laryngeal pressure during the swallow. The entrance and just below the UES appeared min "thicker" though it was patent for bolus material to move through but this might have impacted the clearing of the trials through the UES this trapping bolus material above it as the swallow completed.   D. LARYNGEAL PENETRATION: (Material entering into the laryngeal inlet/vestibule but not aspirated): NONE E. ASPIRATION: NONE F. ESOPHAGEAL PHASE: (Screening of the upper esophagus): There appeared to be potentially "thicker" tissue at, and just below, the UES - this may have impacted bolus motility and clearance as the bolus moved from the Pharynx through the Cervical Esophagus thus resulting in the slight-min pyriform sinus residue post swallow     ASSESSMENT: Pt appeared to present w/ a fairly adequate oropharyngeal swallow function w/ No gross oropharyngeal phase dysphagia noted during this study - in light of pt's baseline. NO Aspiration or Penetration occurred during this study today.  At baseline, pt is Edentulous and prefers to "puree my foods" for easier chewing/gumming (effort), then swallowing. He had identified "bread" as being  difficult previously. Besides pureeing his foods at his meals, he endorsed drinking Ensure as a Supplement but noted the cost of it.  However, during the Pharyngo-Esophageal phase of swallowing, bolus material did not completely clear the UES (consistently) thus a slight amount of residue remained above. This was not consistent w/ liquid consistencies but moreso w/ food consistency(trials). Pt appeared to exhibit fairly efficient laryngeal excursion and laryngeal pressure during the swallow w/ appropriate hyolaryngeal excursion and epiglottic inversion occurring consistently during the swallowing. There appeared to be potentially "thicker" tissue at, and just below, the entrance of the UES - this may have impacted bolus motility and clearance as boluses moved from the Pharynx through the Cervical Esophagus thus resulting in the slight-min pyriform sinus residue post swallows. When pt used a f/u, Dry swallow, this residue reduced and/or cleared consistently.  The oral phase was [facility] for the trials assessed today. Pt reported he purees his foods at home "now" d/t the difficulty of gumming/chewing foods w/out Dentition as well as the effort to "fully get them swallowed" identifying bread. W/ the trials of thin and Nectar liquids and purees, pt exhibited timely, adequate bolus management for A-P transfer and overall oral clearing was achieved post swallow. The pharyngeal phase was [facility]; pt appeared to  exhibit a timely pharyngeal swallow initiation w/ all trials including thin liquids. The pharyngeal swallow appeared to trigger as boluses reached the Valleculae and/or minimally spilled from the Valleculae(thins) - this is adequate for pt's age.  Recommend: consult to ENT for direct view of the pyriform sinuses and UES entrance/tissue w/ consult to GI if indicated - unsure if pt has any Reflux or Esophageal phase dysmotility; education.  Recommend: consult to Dietician for nutritional education and Supplements(drink  forms) as indicated    PLAN/RECOMMENDATIONS:  A. Diet: continue his modified Puree consistency diet as desired; Thin liquids  B. Swallowing Precautions: utilize a f/u, Dry swallow during oral intake to aid clearing of any potential pharyngeal residue; alternate w/ small, single sip of liquid if desired. May consider swallowing Pills Whole in a Puree if desired in future for easier swallowing/clearing. General aspiration precautions.  C. Recommended consultation to: consult to ENT for direct view of the pyriform sinuses and UES entrance/tissue w/ consult to GI if indicated - unsure if pt has any Reflux or Esophageal phase dysmotility;  Consult to Dietician for ed; supplement as indicated  D. Therapy recommendations: None  E. Results and recommendations were discussed w/ patient; video viewed and questions answered b/f patient left            Dysphagia, oral phase  Aspiration pneumonia, unspecified aspiration pneumonia type, unspecified laterality, unspecified part of lung ([place]) - Plan: DG SWALLOW FUNC OP MEDICARE SPEECH PATH, DG SWALLOW FUNC OP MEDICARE SPEECH PATH        Problem List Patient Active Problem List   Diagnosis Date Noted  . Bilateral pneumonia 01/17/2019  . Community acquired pneumonia 12/28/2018  . Acute renal failure (ARF) ([place]) 12/28/2018  . Pneumonia 12/28/2018  . Hemothorax   . Malnutrition of moderate degree 11/02/2017  . Hydropneumothorax 11/01/2017  . Essential hypertension 10/09/2017  . Hyperlipidemia 10/09/2017  . Carotid arterial disease ([place]) 10/02/2017  . Carotid artery stenosis, unilateral, left 09/04/2017  . CVA (cerebral vascular accident) ([place]) 08/14/2017  . Benign prostatic hyperplasia with urinary frequency 06/03/2017  . Depression, prolonged 06/03/2017  . 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 Psoriasis, unspecified 06/03/2017      Orinda Kenner, [place], CCC-SLP Uzma Hellmer 01/28/2019, 4:43 PM  [facility] DIAGNOST[facility] RADIOLOGY [place], [place], [address] Phone: [phone]   Fax:     Name: BARLOW HARRISON MRN: [id number] Date of Birth: [birth year]

## 2019-03-09 ENCOUNTER — Ambulatory Visit: Payer: Medicare HMO

## 2019-03-17 ENCOUNTER — Ambulatory Visit (INDEPENDENT_AMBULATORY_CARE_PROVIDER_SITE_OTHER): Payer: Medicare HMO | Admitting: Vascular Surgery

## 2019-03-17 ENCOUNTER — Other Ambulatory Visit: Payer: Self-pay

## 2019-03-17 ENCOUNTER — Ambulatory Visit (INDEPENDENT_AMBULATORY_CARE_PROVIDER_SITE_OTHER): Payer: Medicare HMO

## 2019-03-17 ENCOUNTER — Encounter (INDEPENDENT_AMBULATORY_CARE_PROVIDER_SITE_OTHER): Payer: Self-pay | Admitting: Vascular Surgery

## 2019-03-17 VITALS — BP 137/74 | HR 63 | Resp 12 | Ht 71.0 in | Wt 145.0 lb

## 2019-03-17 DIAGNOSIS — Z79899 Other long term (current) drug therapy: Secondary | ICD-10-CM

## 2019-03-17 DIAGNOSIS — I10 Essential (primary) hypertension: Secondary | ICD-10-CM

## 2019-03-17 DIAGNOSIS — E785 Hyperlipidemia, unspecified: Secondary | ICD-10-CM

## 2019-03-17 DIAGNOSIS — I6523 Occlusion and stenosis of bilateral carotid arteries: Secondary | ICD-10-CM

## 2019-03-17 DIAGNOSIS — I6522 Occlusion and stenosis of left carotid artery: Secondary | ICD-10-CM

## 2019-03-17 DIAGNOSIS — Z87891 Personal history of nicotine dependence: Secondary | ICD-10-CM

## 2019-03-17 DIAGNOSIS — Z9889 Other specified postprocedural states: Secondary | ICD-10-CM | POA: Diagnosis not present

## 2019-03-17 NOTE — Assessment & Plan Note (Signed)
His carotid duplex today demonstrates mild progression on the right now just into the 40 to 59% range of stenosis.  His left carotid endarterectomy remains widely patent.  No role for intervention at this level of stenosis.  Stroke risk remains less than 1 %/year.  Continue to follow on an annual basis.

## 2019-03-17 NOTE — Progress Notes (Signed)
MRN : [id number]  Martin Bean is a 85 y.o. (06/03/1930) male who presents with chief complaint of  Chief Complaint  Patient presents with  . Follow-up  .  History of Present Illness: Patient returns in follow-up of his carotid disease.  He has had a lot of other systemic symptoms but no focal neurologic symptoms since his last visit.  He is about a year and a half status post left carotid endarterectomy from which he did well.  His carotid duplex today demonstrates mild progression on the right now just into the 40 to 59% range of stenosis.  His left carotid endarterectomy remains widely patent.  Current Outpatient Medications  Medication Sig Dispense Refill  . aspirin EC 81 MG tablet Take 81 mg by mouth daily.     [place] Kitchen. atorvastatin (LIPITOR) 40 MG tablet Take 1 tablet (40 mg total) by mouth daily at 6 PM. 30 tablet 0  . tamsulosin (FLOMAX) 0.4 MG CAPS capsule Take 0.4 mg by mouth daily.     [place] Kitchen. amLODipine (NORVASC) 5 MG tablet Take 1 tablet (5 mg total) by mouth daily. 30 tablet 0  . cyanocobalamin (,VITAMIN B-12,) 1000 MCG/ML injection Inject into the muscle.    . feeding supplement, ENSURE ENLIVE, (ENSURE ENLIVE) LIQD Take 237 mLs by mouth 2 (two) times daily between meals. (Patient not taking: Reported on 03/17/2019) 237 mL 12  . guaiFENesin (MUCINEX) 600 MG 12 hr tablet Take 1 tablet (600 mg total) by mouth 2 (two) times daily. (Patient not taking: Reported on 03/17/2019) 14 tablet 0  . sertraline (ZOLOFT) 100 MG tablet Take 100 mg by mouth daily.     No current facility-administered medications for this visit.     Past Medical History:  Diagnosis Date  . Carotid arterial disease (HCC) 10/02/2017  . CVA (cerebral vascular accident) (HCC) 08/14/2017  . Depression   . Diastolic heart failure (HCC)   . Family history of adverse reaction to anesthesia   . HOH (hard of hearing)   . HTN (hypertension)   . Hydropneumothorax 11/01/2017  . Hyperlipidemia 10/09/2017  . Malnutrition of  moderate degree 11/02/2017  . Pneumonia   . Prostate enlargement   . Psoriasis     Past Surgical History:  Procedure Laterality Date  . CATARACT EXTRACTION W/PHACO Right 02/25/2018   Procedure: CATARACT EXTRACTION PHACO AND INTRAOCULAR LENS PLACEMENT (IOC);  Surgeon: Galen ManilaPorfilio, William, MD;  Location: [facility] ORS;  Service: Ophthalmology;  Laterality: Right;  US 01:03.2 AP% 17.1 CDE 10.83 Fluid Pack lot #[id number] H  . CATARACT EXTRACTION W/PHACO Left 03/18/2018   Procedure: CATARACT EXTRACTION PHACO AND INTRAOCULAR LENS PLACEMENT (IOC);  Surgeon: Galen ManilaPorfilio, William, MD;  Location: [facility] ORS;  Service: Ophthalmology;  Laterality: Left;  US 1.11 AP% 16.7 CDE 11.90 Fluid pack lot # [id number] H  . ENDARTERECTOMY Left 10/02/2017   Procedure: ENDARTERECTOMY CAROTID;  Surgeon: Annice Needyew, Vondra Aldredge S, MD;  Location: [facility] ORS;  Service: Vascular;  Laterality: Left;  . HERNIA REPAIR     Social History        Tobacco Use  . Smoking status: Former Smoker    Types: Cigarettes    Last attempt to quit: 09/24/2013    Years since quitting: 4.4  . Smokeless tobacco: Never Used  Substance Use Topics  . Alcohol use: No    Frequency: Never  . Drug use: No    Family History      Family History  Problem Relation Age of Onset  . Stroke Mother  No Known Allergies   REVIEW OF SYSTEMS (Negative unless checked)  Constitutional: [] ?Weight loss  [] ?Fever  [] ?Chills Cardiac: [] ?Chest pain   [] ?Chest pressure   [] ?Palpitations   [] ?Shortness of breath when laying flat   [] ?Shortness of breath at rest   [] ?Shortness of breath with exertion. Vascular:  [] ?Pain in legs with walking   [] ?Pain in legs at rest   [] ?Pain in legs when laying flat   [] ?Claudication   [] ?Pain in feet when walking  [] ?Pain in feet at rest  [] ?Pain in feet when laying flat   [] ?History of DVT   [] ?Phlebitis   [] ?Swelling in legs   [] ?Varicose veins   [] ?Non-healing ulcers Pulmonary:   [] ?Uses home oxygen   [] ?Productive cough    [] ?Hemoptysis   [] ?Wheeze  [] ?COPD   [] ?Asthma Neurologic:  [] ?Dizziness  [] ?Blackouts   [] ?Seizures   [x] ?History of stroke   [] ?History of TIA  [] ?Aphasia   [] ?Temporary blindness   [] ?Dysphagia   [] ?Weakness or numbness in arms   [] ?Weakness or numbness in legs Musculoskeletal:  [x] ?Arthritis   [] ?Joint swelling   [] ?Joint pain   [] ?Low back pain Hematologic:  [] ?Easy bruising  [] ?Easy bleeding   [] ?Hypercoagulable state   [] ?Anemic  [] ?Hepatitis Gastrointestinal:  [] ?Blood in stool   [] ?Vomiting blood  [] ?Gastroesophageal reflux/heartburn   [] ?Difficulty swallowing. Genitourinary:  [] ?Chronic kidney disease   [] ?Difficult urination  [] ?Frequent urination  [] ?Burning with urination   [] ?Blood in urine Skin:  [x] ?Rashes   [] ?Ulcers   [] ?Wounds Psychological:  [] ?History of anxiety   [] ? History of major depression.    Physical Examination  Vitals:   03/17/19 1350  BP: 137/74  Pulse: 63  Resp: 12  Weight: 145 lb (65.8 kg)  Height: 5\' 11"  (1.803 m)   Body mass index is 20.22 kg/m. Gen:  WD/WN, NAD. Appears younger than stated age. Head: [place]/AT, No temporalis wasting. Ear/Nose/Throat: Hearing grossly intact, nares w/o erythema or drainage, trachea midline Eyes: Conjunctiva clear. Sclera non-icteric Neck: Supple.  No bruit  Pulmonary:  Good air movement, equal and clear to auscultation bilaterally.  Cardiac: RRR, No JVD Vascular:  Vessel Right Left  Radial Palpable Palpable               Musculoskeletal: M/S 5/5 throughout.  No deformity or atrophy. No edema. Neurologic: CN 2-12 intact. Sensation grossly intact in extremities.  Symmetrical.  Speech is fluent. Motor exam as listed above. Psychiatric: Judgment intact, Mood & affect appropriate for pt's clinical situation. Dermatologic: No rashes or ulcers noted.  No cellulitis or open wounds.      CBC Lab Results  Component Value Date   WBC 11.6 (H) 01/20/2019   HGB 8.8 (L) 01/20/2019   HCT 27.2 (L) 01/20/2019   MCV  86.1 01/20/2019   PLT 280 01/20/2019    BMET    Component Value Date/Time   NA 137 01/20/2019 0314   K 3.5 01/20/2019 0314   CL 107 01/20/2019 0314   CO2 24 01/20/2019 0314   GLUCOSE 120 (H) 01/20/2019 0314   BUN 11 01/20/2019 0314   CREATININE 0.65 01/20/2019 0314   CREATININE 0.84 01/02/2013 0923   CALCIUM 7.7 (L) 01/20/2019 0314   GFRNONAA >60 01/20/2019 0314   GFRNONAA >60 01/02/2013 0923   GFRAA >60 01/20/2019 0314   GFRAA >60 01/02/2013 0923   CrCl cannot be calculated (Patient's most recent lab result is older than the maximum 21 days allowed.).  COAG Lab Results  Component Value Date   INR  1.3 (H) 01/17/2019   INR 1.24 11/02/2017   INR 1.20 11/01/2017    Radiology No results found.    Assessment/Plan Hyperlipidemia lipid control important in reducing the progression of atherosclerotic disease. Continue statin therapy   Essential hypertension blood pressure control important in reducing the progression of atherosclerotic disease. On appropriate oral medications.  Carotid arterial disease (HCC) His carotid duplex today demonstrates mild progression on the right now just into the 40 to 59% range of stenosis.  His left carotid endarterectomy remains widely patent.  No role for intervention at this level of stenosis.  Stroke risk remains less than 1 %/year.  Continue to follow on an annual basis.    Festus BarrenJason Anesha Hackert, MD  03/17/2019 2:29 PM    This note was created with Dragon medical transcription system.  Any errors from dictation are purely unintentional

## 2019-04-03 ENCOUNTER — Other Ambulatory Visit: Payer: Self-pay | Admitting: Pulmonary Disease

## 2019-04-03 DIAGNOSIS — J189 Pneumonia, unspecified organism: Secondary | ICD-10-CM

## 2019-04-07 ENCOUNTER — Other Ambulatory Visit: Payer: Self-pay

## 2019-04-07 ENCOUNTER — Ambulatory Visit
Admission: RE | Admit: 2019-04-07 | Discharge: 2019-04-07 | Disposition: A | Discharge status: Home / Self Care | Payer: Medicare HMO | Source: Ambulatory Visit | Attending: Pulmonary Disease | Admitting: Pulmonary Disease

## 2019-04-07 DIAGNOSIS — J189 Pneumonia, unspecified organism: Secondary | ICD-10-CM | POA: Insufficient documentation

## 2019-04-07 MED ORDER — IOHEXOL 300 MG/ML  SOLN
75.0000 mL | Freq: Once | INTRAMUSCULAR | Status: AC | PRN
  Administered 2019-04-07: 10:00:00 75 mL via INTRAVENOUS

## 2019-12-24 IMAGING — DX PORTABLE CHEST - 1 VIEW
1 series · 1 of 1 positions shown · non-contrast
Comparison: 12/28/2018

CLINICAL DATA: Cough and dyspnea

EXAM:
PORTABLE CHEST 1 VIEW

[chest ap]
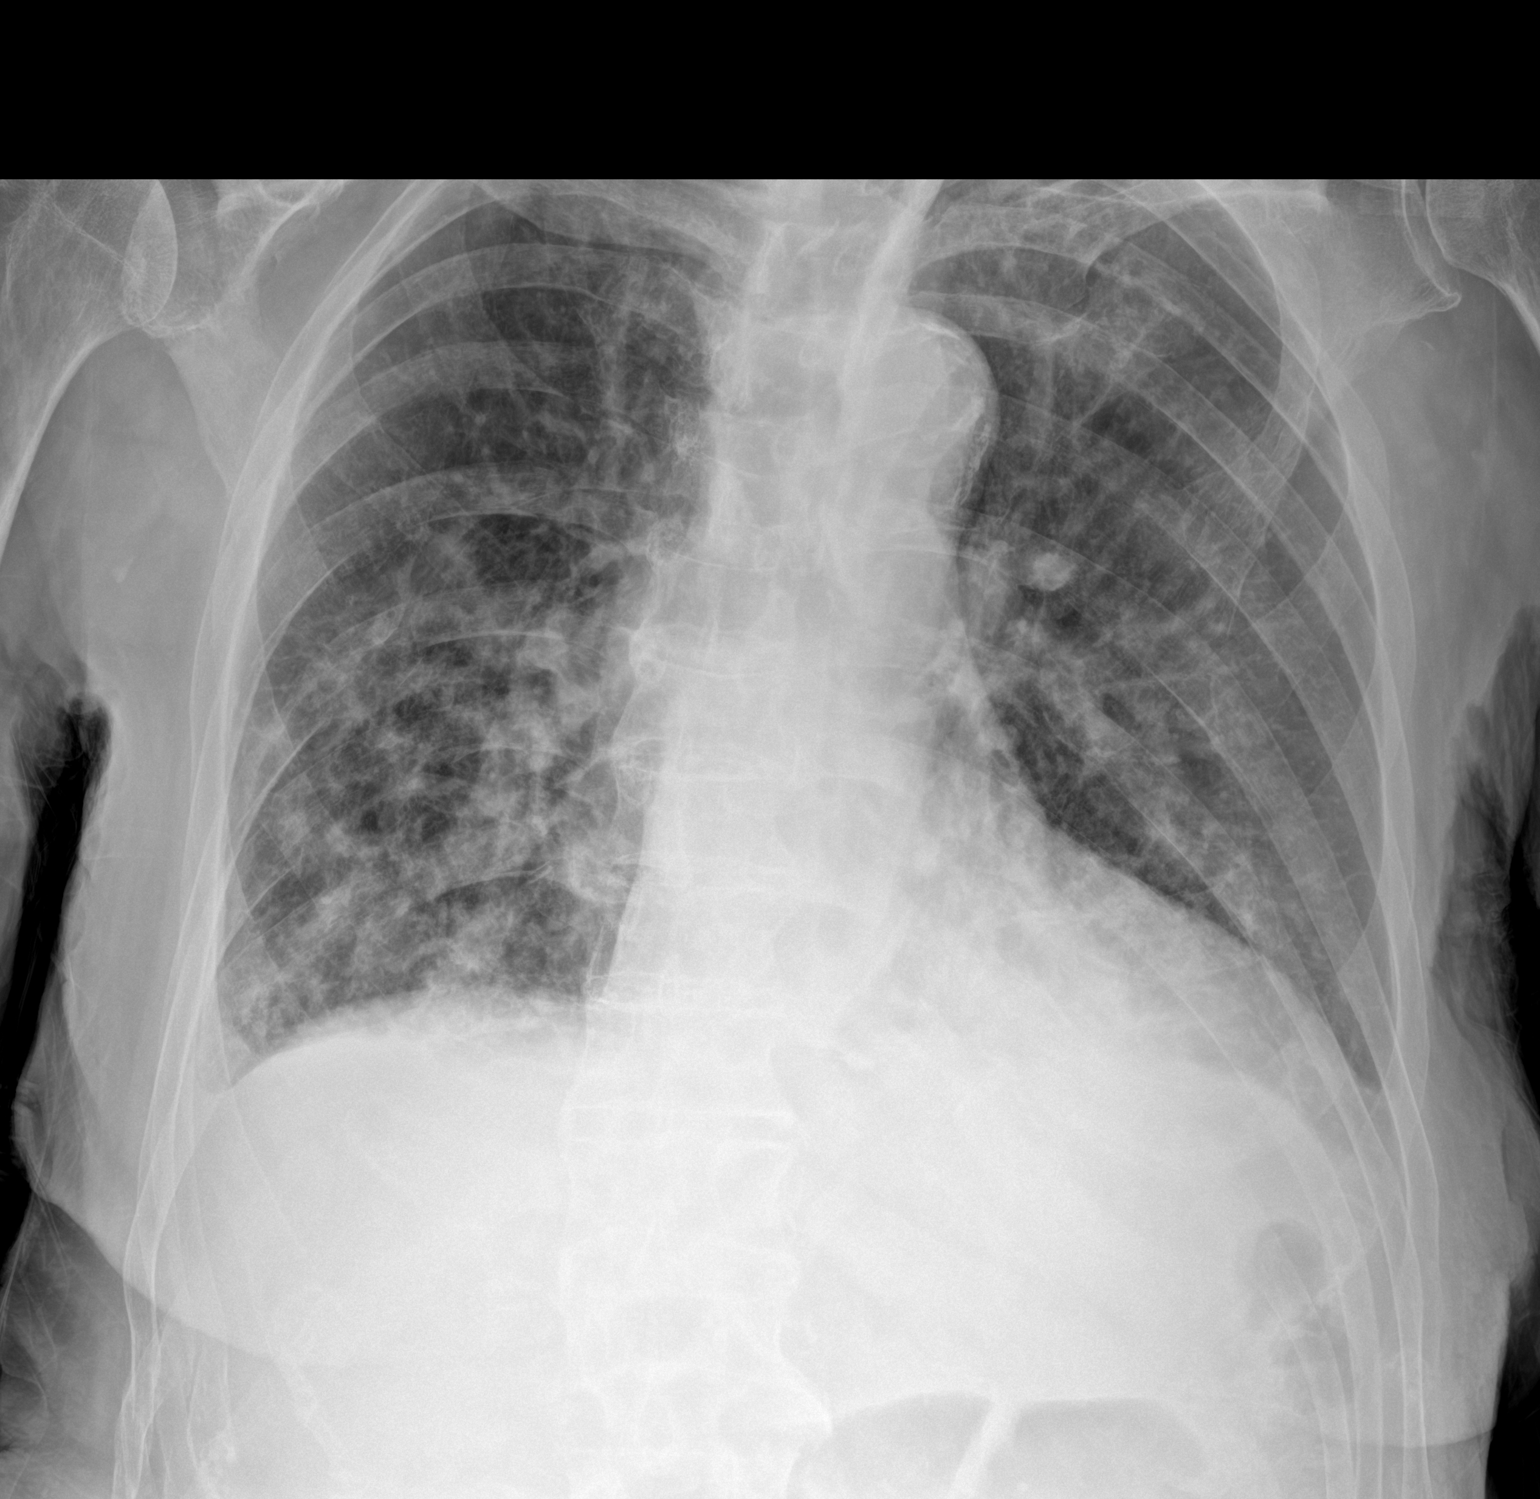

[1 of 1 positions shown; findings below may reference images not displayed]

FINDINGS: Chronic cardiomegaly. Stable mediastinal contours. Coarse lung
markings and reticulonodular opacity at the bases, worse on the
left.
IMPRESSION: 1. New left lower lobe airspace disease compatible with pneumonia in
this setting.
2. Continued right lower lobe infiltrate when compared to
12/28/2018.
3. Recommend follow-up after treatment.

## 2020-03-13 IMAGING — CT CT CHEST WITH CONTRAST
1 series · 15 of 34 positions shown, 19 images · IV contrast (omnipaque)
Comparison: 11/02/2017

CLINICAL DATA: Recurrent pneumonia.

EXAM:
CT CHEST WITH CONTRAST
TECHNIQUE: Multidetector CT imaging of the chest was performed during
intravenous contrast administration.
CONTRAST:  75mL OMNIPAQUE IOHEXOL 300 MG/ML  SOLN

[Series 2: axial st · axial · 0.65mm/px · z∈[-270,-24]mm · 15 of 145 slices shown, 19 images]
[im 11/145  mediastinal]
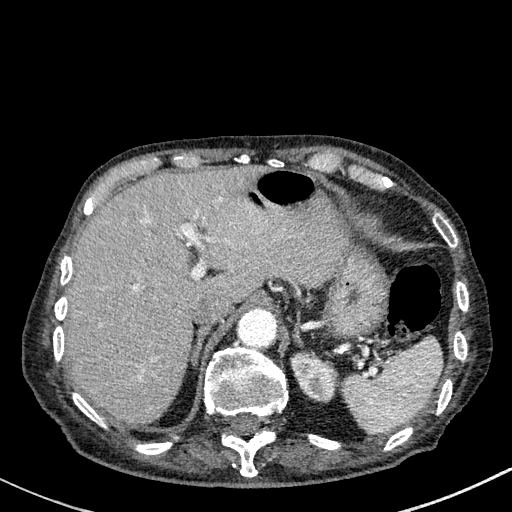
[im 11/145  lung]
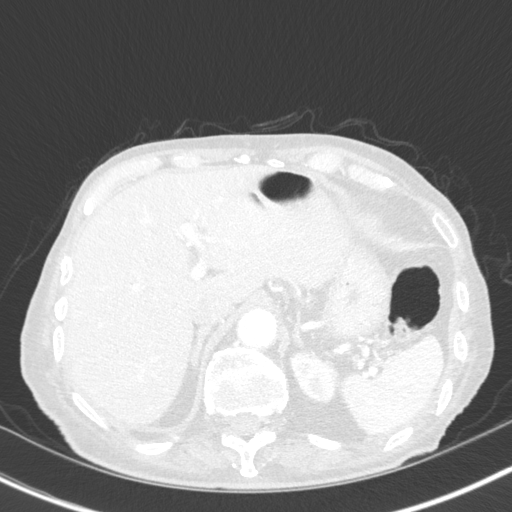
[im 22/145  lung]
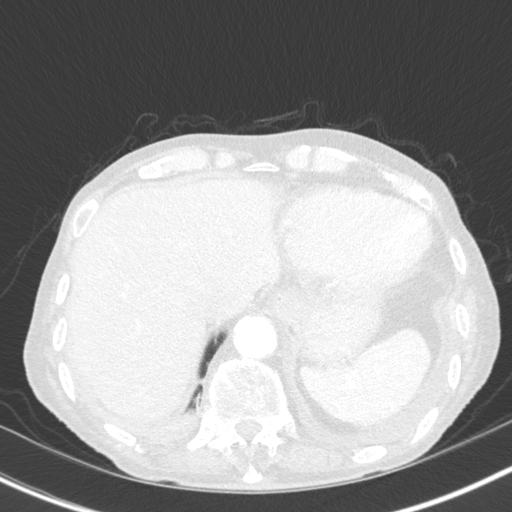
[im 29/145  lung]
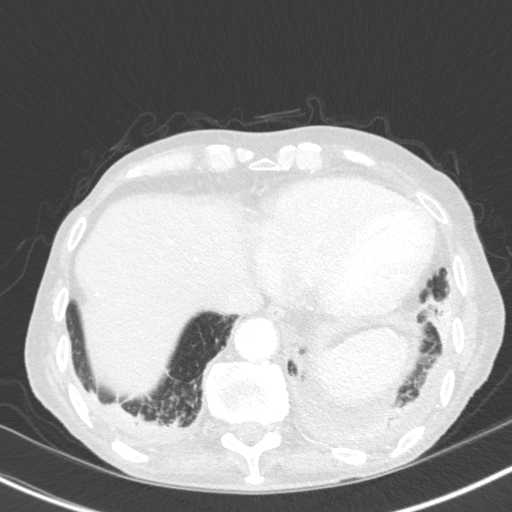
[im 38/145  lung]
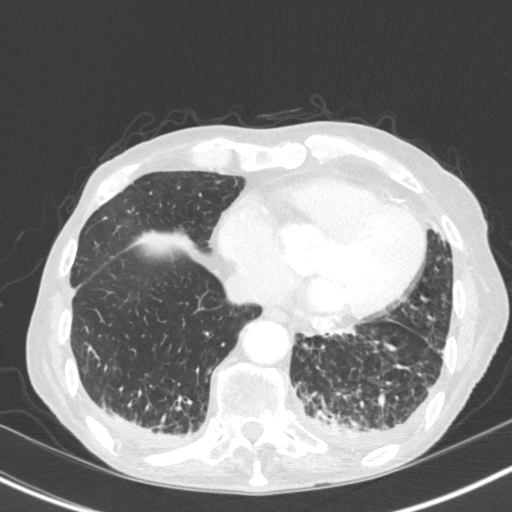
[im 49/145  mediastinal]
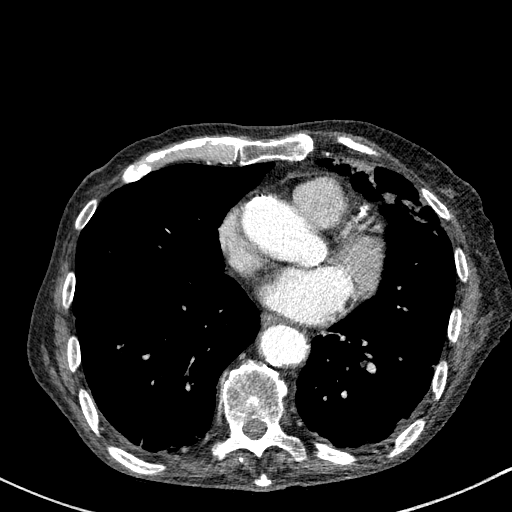
[im 49/145  lung]
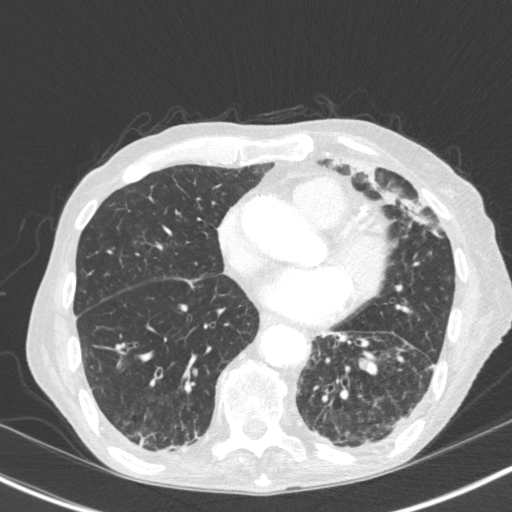
[im 58/145  lung]
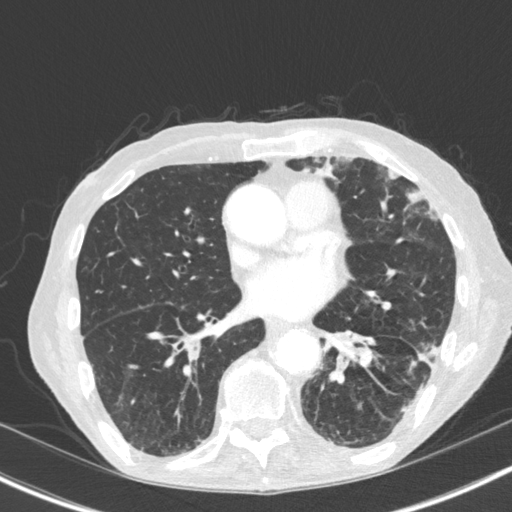
[im 65/145  lung]
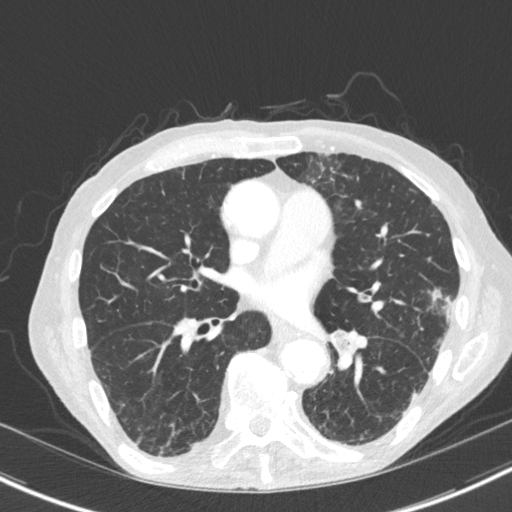
[im 75/145  lung]
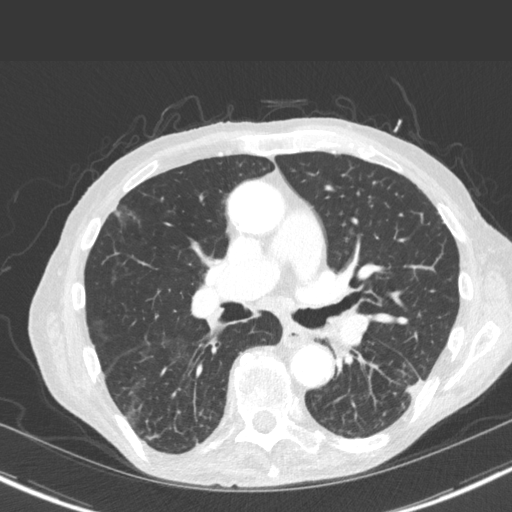
[im 81/145  mediastinal]
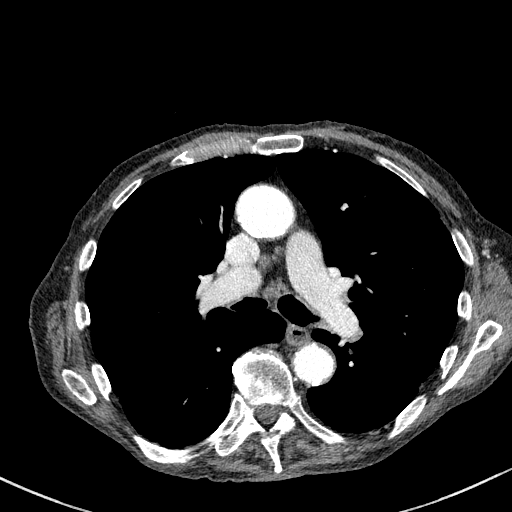
[im 81/145  lung]
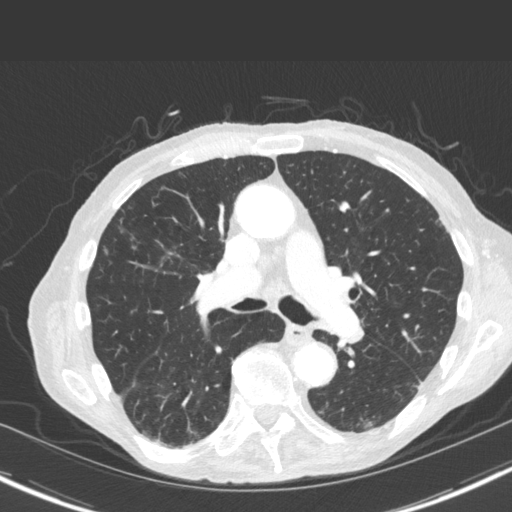
[im 87/145  lung]
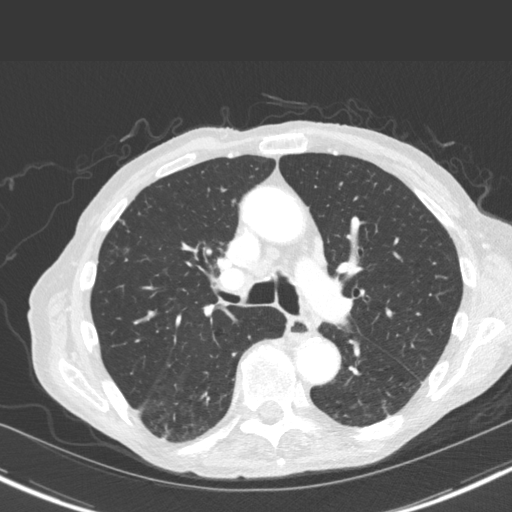
[im 97/145  lung]
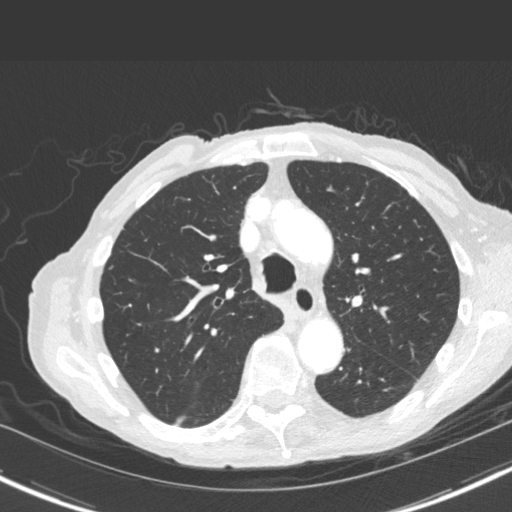
[im 107/145  lung]
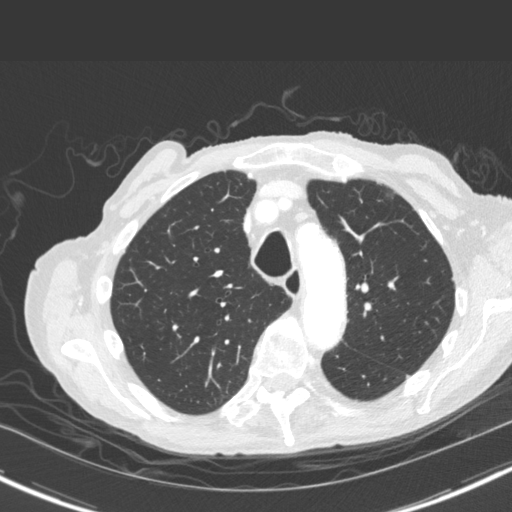
[im 116/145  mediastinal]
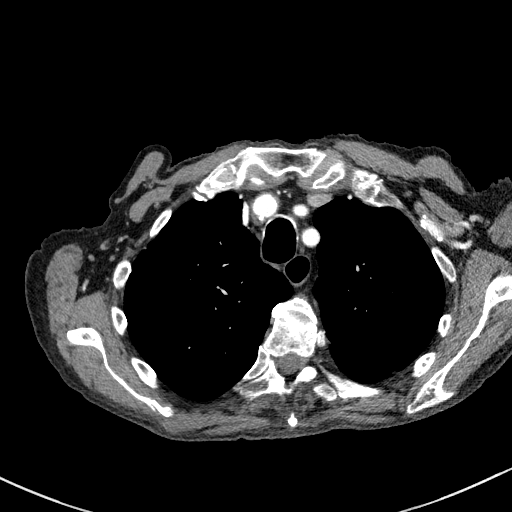
[im 116/145  lung]
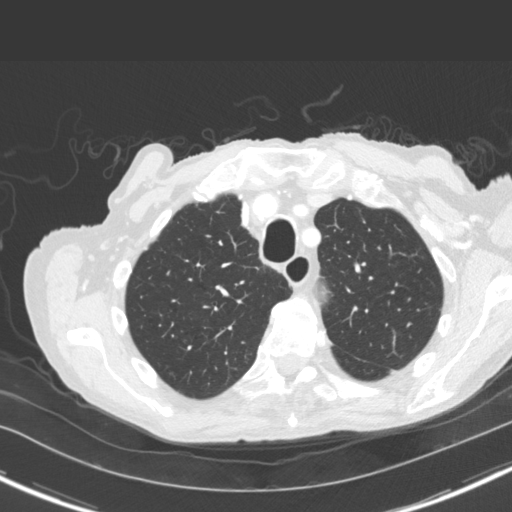
[im 123/145  lung]
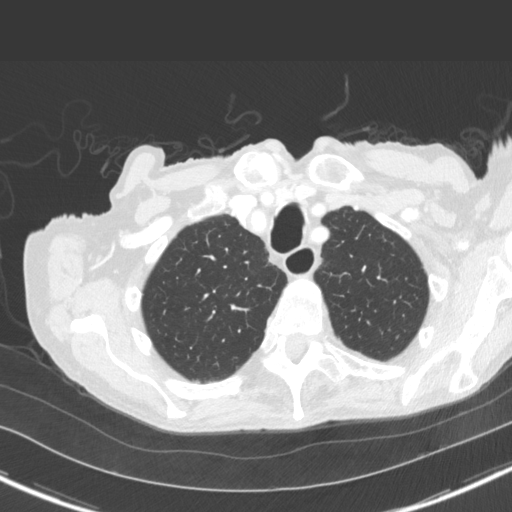
[im 134/145  lung]
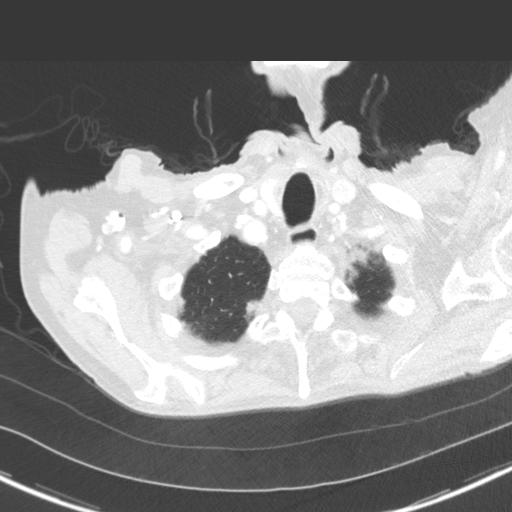

[15 of 34 positions shown; findings below may reference images not displayed]

FINDINGS: Cardiovascular: Heart size upper normal. Coronary artery
calcification is evident. Atherosclerotic calcification is noted in
the wall of the thoracic aorta.

Mediastinum/Nodes: 9 mm short axis subcarinal lymph node is upper
normal for size. 11 mm short axis precarinal lymph node is mildly
increased in size in stable since prior study. Upper normal AP
window lymph nodes evident. There is no hilar lymphadenopathy. There
is no axillary lymphadenopathy.

Lungs/Pleura: Centrilobular emphsyema noted. Moderate right pleural
effusion seen previously has resolved in the interval there is
dependent atelectasis/infiltrate in the lower lungs bilaterally.
Patchy airspace disease in the lingula is new in the interval.

6 mm right upper lobe nodule visible on 55/3. Tree-in-bud nodularity
seen in the right upper lobe.

Areas of cylindrical bronchiectasis and bronchial wall thickening
are noted in the lower lobes bilaterally. Secretions/debris noted in
the left lower lobe bronchus extending into segmental and
subsegmental airways in the left lower lobe. Small airway impaction
noted lingula and right lower lobe as well.

Upper Abdomen: Main pancreatic duct prominent in the body of
pancreas but incompletely visualized.

Musculoskeletal: No worrisome lytic or sclerotic osseous
abnormality.
IMPRESSION: 1. Patchy airspace disease noted left upper lobe suggesting
multifocal pneumonia.
2. Secretions/debris noted in lobar and more peripheral left lower
lobe bronchi with small airway impaction noted lingula and right
lower lobe.
3. Mild atelectasis/infiltrate in the posterior/dependent lower
lobes bilaterally.
4. Tree-in-bud nodularity in the right upper lobe, atypical
infection would be a consideration.
5. Upper normal to borderline mediastinal lymphadenopathy,
potentially reactive but follow-up recommended to ensure stability.
6.  Emphysema. (QP81L-E3R.J)
7.  Aortic Atherosclerois (QP81L-170.0)

## 2020-03-22 ENCOUNTER — Ambulatory Visit (INDEPENDENT_AMBULATORY_CARE_PROVIDER_SITE_OTHER): Payer: Medicare HMO | Admitting: Vascular Surgery

## 2020-03-22 ENCOUNTER — Encounter (INDEPENDENT_AMBULATORY_CARE_PROVIDER_SITE_OTHER): Payer: Medicare HMO

## 2020-07-17 ENCOUNTER — Inpatient Hospital Stay
Admission: EM | Admit: 2020-07-17 | Discharge: 2020-07-29 | DRG: 193 | Disposition: A | Discharge status: Skilled Nursing Facility | Payer: Medicare HMO | Source: Skilled Nursing Facility | Attending: Internal Medicine | Admitting: Internal Medicine

## 2020-07-17 ENCOUNTER — Other Ambulatory Visit: Payer: Self-pay

## 2020-07-17 ENCOUNTER — Encounter: Payer: Self-pay | Admitting: Emergency Medicine

## 2020-07-17 ENCOUNTER — Emergency Department: Payer: Medicare HMO

## 2020-07-17 DIAGNOSIS — Z66 Do not resuscitate: Secondary | ICD-10-CM | POA: Diagnosis present

## 2020-07-17 DIAGNOSIS — I2510 Atherosclerotic heart disease of native coronary artery without angina pectoris: Secondary | ICD-10-CM | POA: Diagnosis present

## 2020-07-17 DIAGNOSIS — N400 Enlarged prostate without lower urinary tract symptoms: Secondary | ICD-10-CM | POA: Diagnosis present

## 2020-07-17 DIAGNOSIS — I160 Hypertensive urgency: Secondary | ICD-10-CM | POA: Diagnosis present

## 2020-07-17 DIAGNOSIS — E222 Syndrome of inappropriate secretion of antidiuretic hormone: Secondary | ICD-10-CM | POA: Diagnosis present

## 2020-07-17 DIAGNOSIS — Z20822 Contact with and (suspected) exposure to covid-19: Secondary | ICD-10-CM | POA: Diagnosis present

## 2020-07-17 DIAGNOSIS — E785 Hyperlipidemia, unspecified: Secondary | ICD-10-CM | POA: Diagnosis present

## 2020-07-17 DIAGNOSIS — I10 Essential (primary) hypertension: Secondary | ICD-10-CM

## 2020-07-17 DIAGNOSIS — E43 Unspecified severe protein-calorie malnutrition: Secondary | ICD-10-CM | POA: Insufficient documentation

## 2020-07-17 DIAGNOSIS — I110 Hypertensive heart disease with heart failure: Secondary | ICD-10-CM | POA: Diagnosis present

## 2020-07-17 DIAGNOSIS — I700 Atherosclerosis of aorta: Secondary | ICD-10-CM | POA: Diagnosis present

## 2020-07-17 DIAGNOSIS — Z8673 Personal history of transient ischemic attack (TIA), and cerebral infarction without residual deficits: Secondary | ICD-10-CM

## 2020-07-17 DIAGNOSIS — Z7189 Other specified counseling: Secondary | ICD-10-CM | POA: Diagnosis not present

## 2020-07-17 DIAGNOSIS — Z7951 Long term (current) use of inhaled steroids: Secondary | ICD-10-CM

## 2020-07-17 DIAGNOSIS — Z7982 Long term (current) use of aspirin: Secondary | ICD-10-CM

## 2020-07-17 DIAGNOSIS — L409 Psoriasis, unspecified: Secondary | ICD-10-CM | POA: Diagnosis present

## 2020-07-17 DIAGNOSIS — M79605 Pain in left leg: Secondary | ICD-10-CM | POA: Diagnosis not present

## 2020-07-17 DIAGNOSIS — J439 Emphysema, unspecified: Secondary | ICD-10-CM | POA: Diagnosis present

## 2020-07-17 DIAGNOSIS — J189 Pneumonia, unspecified organism: Secondary | ICD-10-CM | POA: Diagnosis present

## 2020-07-17 DIAGNOSIS — Z515 Encounter for palliative care: Secondary | ICD-10-CM | POA: Diagnosis not present

## 2020-07-17 DIAGNOSIS — I509 Heart failure, unspecified: Secondary | ICD-10-CM

## 2020-07-17 DIAGNOSIS — H9190 Unspecified hearing loss, unspecified ear: Secondary | ICD-10-CM | POA: Diagnosis present

## 2020-07-17 DIAGNOSIS — E871 Hypo-osmolality and hyponatremia: Secondary | ICD-10-CM | POA: Diagnosis not present

## 2020-07-17 DIAGNOSIS — M79606 Pain in leg, unspecified: Secondary | ICD-10-CM

## 2020-07-17 DIAGNOSIS — I5033 Acute on chronic diastolic (congestive) heart failure: Secondary | ICD-10-CM | POA: Diagnosis present

## 2020-07-17 DIAGNOSIS — M549 Dorsalgia, unspecified: Secondary | ICD-10-CM | POA: Diagnosis present

## 2020-07-17 DIAGNOSIS — K219 Gastro-esophageal reflux disease without esophagitis: Secondary | ICD-10-CM | POA: Diagnosis present

## 2020-07-17 DIAGNOSIS — Z79899 Other long term (current) drug therapy: Secondary | ICD-10-CM

## 2020-07-17 DIAGNOSIS — F419 Anxiety disorder, unspecified: Secondary | ICD-10-CM | POA: Diagnosis present

## 2020-07-17 DIAGNOSIS — Z87891 Personal history of nicotine dependence: Secondary | ICD-10-CM

## 2020-07-17 DIAGNOSIS — R531 Weakness: Secondary | ICD-10-CM

## 2020-07-17 DIAGNOSIS — Z6821 Body mass index (BMI) 21.0-21.9, adult: Secondary | ICD-10-CM | POA: Diagnosis not present

## 2020-07-17 DIAGNOSIS — Z823 Family history of stroke: Secondary | ICD-10-CM

## 2020-07-17 DIAGNOSIS — R0602 Shortness of breath: Secondary | ICD-10-CM | POA: Diagnosis present

## 2020-07-17 DIAGNOSIS — J9601 Acute respiratory failure with hypoxia: Secondary | ICD-10-CM

## 2020-07-17 DIAGNOSIS — J441 Chronic obstructive pulmonary disease with (acute) exacerbation: Secondary | ICD-10-CM

## 2020-07-17 DIAGNOSIS — Z8701 Personal history of pneumonia (recurrent): Secondary | ICD-10-CM

## 2020-07-17 DIAGNOSIS — R0902 Hypoxemia: Secondary | ICD-10-CM

## 2020-07-17 DIAGNOSIS — F32A Depression, unspecified: Secondary | ICD-10-CM | POA: Diagnosis present

## 2020-07-17 LAB — BASIC METABOLIC PANEL
Anion gap: 9 (ref 5–15)
BUN: 13 mg/dL (ref 8–23)
CO2: 23 mmol/L (ref 22–32)
Calcium: 8.6 mg/dL — ABNORMAL LOW (ref 8.9–10.3)
Chloride: 90 mmol/L — ABNORMAL LOW (ref 98–111)
Creatinine, Ser: 0.64 mg/dL (ref 0.61–1.24)
GFR, Estimated: >60 mL/min (ref >60)
Glucose, Bld: 131 mg/dL — ABNORMAL HIGH (ref 70–99)
Potassium: 4.5 mmol/L (ref 3.5–5.1)
Sodium: 122 mmol/L — ABNORMAL LOW (ref 135–145)

## 2020-07-17 LAB — CBC
HCT: 31.7 % — ABNORMAL LOW (ref 39.0–52.0)
Hemoglobin: 11 g/dL — ABNORMAL LOW (ref 13.0–17.0)
MCH: 30.5 pg (ref 26.0–34.0)
MCHC: 34.7 g/dL (ref 30.0–36.0)
MCV: 87.8 fL (ref 80.0–100.0)
Platelets: 255 10*3/uL (ref 150–400)
RBC: 3.61 MIL/uL — ABNORMAL LOW (ref 4.22–5.81)
RDW: 13.3 % (ref 11.5–15.5)
WBC: 8.8 10*3/uL (ref 4.0–10.5)
nRBC: 0 % (ref 0.0–0.2)

## 2020-07-17 LAB — SODIUM, URINE, RANDOM: Sodium, Ur: 121 mmol/L

## 2020-07-17 LAB — RESP PANEL BY RT-PCR (FLU A&B, COVID) ARPGX2
Influenza A by PCR: NEGATIVE
Influenza B by PCR: NEGATIVE
SARS Coronavirus 2 by RT PCR: NEGATIVE

## 2020-07-17 LAB — PROCALCITONIN: Procalcitonin: <0.1 ng/mL

## 2020-07-17 LAB — BRAIN NATRIURETIC PEPTIDE: B Natriuretic Peptide: 202 pg/mL — ABNORMAL HIGH (ref 0.0–100.0)

## 2020-07-17 LAB — STREP PNEUMONIAE URINARY ANTIGEN: Strep Pneumo Urinary Antigen: NEGATIVE

## 2020-07-17 LAB — OSMOLALITY, URINE: Osmolality, Ur: 430 mOsm/kg (ref 300–900)

## 2020-07-17 LAB — TROPONIN I (HIGH SENSITIVITY)
Troponin I (High Sensitivity): 12 ng/L (ref <18)
Troponin I (High Sensitivity): 12 ng/L (ref <18)

## 2020-07-17 MED ORDER — LABETALOL HCL 5 MG/ML IV SOLN
10.0000 mg | Freq: Once | INTRAVENOUS | Status: DC
  Filled 2020-07-17: qty 4

## 2020-07-17 MED ORDER — SODIUM CHLORIDE 0.9 % IV SOLN
1.0000 g | Freq: Once | INTRAVENOUS | Status: AC
  Administered 2020-07-17: 1 g via INTRAVENOUS
  Filled 2020-07-17: qty 10

## 2020-07-17 MED ORDER — SODIUM CHLORIDE 0.9 % IV SOLN: 250.0000 mL | INTRAVENOUS | Status: DC | PRN

## 2020-07-17 MED ORDER — ENOXAPARIN SODIUM 40 MG/0.4ML ~~LOC~~ SOLN: 40.0000 mg | SUBCUTANEOUS | Status: DC

## 2020-07-17 MED ORDER — FUROSEMIDE 10 MG/ML IJ SOLN
40.0000 mg | Freq: Once | INTRAMUSCULAR | Status: AC
  Administered 2020-07-17: 40 mg via INTRAVENOUS
  Filled 2020-07-17: qty 4

## 2020-07-17 MED ORDER — SODIUM CHLORIDE 0.9% FLUSH: 3.0000 mL | INTRAVENOUS | Status: DC | PRN

## 2020-07-17 MED ORDER — ONDANSETRON HCL 4 MG/2ML IJ SOLN: 4.0000 mg | Freq: Four times a day (QID) | INTRAMUSCULAR | Status: DC | PRN

## 2020-07-17 MED ORDER — ACETAMINOPHEN 325 MG PO TABS
650.0000 mg | ORAL_TABLET | ORAL | Status: DC | PRN
  Administered 2020-07-18 – 2020-07-28 (×7): 650 mg via ORAL
  Filled 2020-07-17 (×8): qty 2

## 2020-07-17 MED ORDER — LABETALOL HCL 5 MG/ML IV SOLN
10.0000 mg | INTRAVENOUS | Status: DC | PRN
  Administered 2020-07-22 – 2020-07-23 (×2): 10 mg via INTRAVENOUS
  Filled 2020-07-17 (×2): qty 4

## 2020-07-17 MED ORDER — SODIUM CHLORIDE 0.9 % IV SOLN
2.0000 g | INTRAVENOUS | Status: AC
  Administered 2020-07-18 – 2020-07-21 (×4): 2 g via INTRAVENOUS
  Filled 2020-07-17 (×5): qty 20

## 2020-07-17 MED ORDER — SODIUM CHLORIDE 0.9 % IV SOLN
500.0000 mg | Freq: Once | INTRAVENOUS | Status: AC
  Administered 2020-07-17: 500 mg via INTRAVENOUS
  Filled 2020-07-17: qty 500

## 2020-07-17 MED ORDER — SODIUM CHLORIDE 0.9 % IV SOLN
500.0000 mg | INTRAVENOUS | Status: DC
  Administered 2020-07-18 – 2020-07-19 (×2): 500 mg via INTRAVENOUS
  Filled 2020-07-17 (×3): qty 500

## 2020-07-17 MED ORDER — SODIUM CHLORIDE 0.9% FLUSH
3.0000 mL | Freq: Two times a day (BID) | INTRAVENOUS | Status: DC
  Administered 2020-07-17 – 2020-07-29 (×23): 3 mL via INTRAVENOUS

## 2020-07-17 MED ORDER — FUROSEMIDE 10 MG/ML IJ SOLN: 40.0000 mg | Freq: Two times a day (BID) | INTRAMUSCULAR | Status: DC

## 2020-07-17 NOTE — ED Notes (Signed)
Report given to Kassie, RN.

## 2020-07-17 NOTE — ED Provider Notes (Signed)
[facility] Emergency Department Provider Note ____________________________________________   First MD Initiated Contact with Patient 07/17/20 1802     (approximate)  I have reviewed the triage vital signs and the nursing notes.  HISTORY  Chief Complaint Shortness of Breath   HPI Martin Bean is a 86 y.o. malewho presents to the ED for evaluation of shortness of breath.  Chart review indicates history of HTN, HLD, CVA.  Previously saw cardiology 1 year ago, Dr. Lady Gary, and an echo was noted to have a normal LV function but moderate MR and mild to moderate TR. Patient resides a local SNF, [facility]  Patient presents to the ED with his daughter, who provides supplemental history.  Daughter reports seeing him at least weekly.  Daughter reports about 2 weeks of progressively worsening generalized weakness.  No known falls, fevers, syncopal episodes.  She reports that he ambulates with a walker at baseline.  Daughter reports that patient is quite reserved and it is very unusual that he elected to come to the hospital, particularly via EMS, and this tells her that there is something 001.  Patient reports about 1 day of increasing shortness of breath, minimally productive cough, generalized weakness and nausea.  He denies pain anywhere, including chest pain, abdominal pain.  He denies any syncopal episodes or falls.   Past Medical History:  Diagnosis Date  . Carotid arterial disease (HCC) 10/02/2017  . CVA (cerebral vascular accident) (HCC) 08/14/2017  . Depression   . Diastolic heart failure (HCC)   . Family history of adverse reaction to anesthesia   . HOH (hard of hearing)   . HTN (hypertension)   . Hydropneumothorax 11/01/2017  . Hyperlipidemia 10/09/2017  . Malnutrition of moderate degree 11/02/2017  . Pneumonia   . Prostate enlargement   . Psoriasis     Patient Active Problem List   Diagnosis Date Noted  . Bilateral pneumonia 01/17/2019  .  Community acquired pneumonia 12/28/2018  . Acute renal failure (ARF) (HCC) 12/28/2018  . Pneumonia 12/28/2018  . Hemothorax   . Malnutrition of moderate degree 11/02/2017  . Hydropneumothorax 11/01/2017  . Essential hypertension 10/09/2017  . Hyperlipidemia 10/09/2017  . Carotid arterial disease (HCC) 10/02/2017  . Carotid artery stenosis, unilateral, left 09/04/2017  . CVA (cerebral vascular accident) (HCC) 08/14/2017  . Benign prostatic hyperplasia with urinary frequency 06/03/2017  . Depression, prolonged 06/03/2017  . 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 Psoriasis, unspecified 06/03/2017    Past Surgical History:  Procedure Laterality Date  . CATARACT EXTRACTION W/PHACO Right 02/25/2018   Procedure: CATARACT EXTRACTION PHACO AND INTRAOCULAR LENS PLACEMENT (IOC);  Surgeon: Galen Manila, MD;  Location: [facility] ORS;  Service: Ophthalmology;  Laterality: Right;  [place] 01:03.2 AP% 17.1 CDE 10.83 Fluid Pack lot #[id number]  . CATARACT EXTRACTION W/PHACO Left 03/18/2018   Procedure: CATARACT EXTRACTION PHACO AND INTRAOCULAR LENS PLACEMENT (IOC);  Surgeon: Galen Manila, MD;  Location: [facility] ORS;  Service: Ophthalmology;  Laterality: Left;  [place] 1.11 AP% 16.7 CDE 11.90 Fluid pack lot # [id number]  . ENDARTERECTOMY Left 10/02/2017   Procedure: ENDARTERECTOMY CAROTID;  Surgeon: Annice Needy, MD;  Location: [facility] ORS;  Service: Vascular;  Laterality: Left;  . HERNIA REPAIR      Prior to Admission medications   Medication Sig Start Date End Date Taking? Authorizing Provider  amLODipine (NORVASC) 5 MG tablet Take 1 tablet (5 mg total) by mouth daily. 12/30/18   Salary, Jetty Duhamel D, MD  aspirin EC 81 MG tablet Take 81 mg  by mouth daily.  08/16/17   [provider]  atorvastatin (LIPITOR) 40 MG tablet Take 1 tablet (40 mg total) by mouth daily at 6 PM. 08/15/17   Sudini, Wardell HeathSrikar, MD  cyanocobalamin (,VITAMIN B-12,) 1000 MCG/ML injection Inject into the muscle. 02/26/18   [provider]    feeding supplement, ENSURE ENLIVE, (ENSURE ENLIVE) LIQD Take 237 mLs by mouth 2 (two) times daily between meals. Patient not taking: Reported on 03/17/2019 11/06/17   Adrian SaranMody, Sital, MD  sertraline (ZOLOFT) 100 MG tablet Take 100 mg by mouth daily. 10/16/18 10/16/19  [provider]  tamsulosin (FLOMAX) 0.4 MG CAPS capsule Take 0.4 mg by mouth daily.  06/03/17   [provider]    Allergies Patient has no known allergies.  Family History  Problem Relation Age of Onset  . Stroke Mother     Social History Social History   Tobacco Use  . Smoking status: Former Smoker    Types: Cigarettes    Quit date: 09/24/2013    Years since quitting: 6.8  . Smokeless tobacco: Never Used  Vaping Use  . Vaping Use: Never used  Substance Use Topics  . Alcohol use: No  . Drug use: No    Review of Systems  Constitutional: No fever/chills.  Positive for generalized weakness. Eyes: No visual changes. ENT: No sore throat. Cardiovascular: Denies chest pain. Respiratory: Positive for shortness of breath and cough.. Gastrointestinal: No abdominal pain.  No nausea, no vomiting.  No diarrhea.  No constipation. Genitourinary: Negative for dysuria. Musculoskeletal: Negative for back pain. Skin: Negative for rash. Neurological: Negative for headaches, focal weakness or numbness.  ____________________________________________   PHYSICAL EXAM:  VITAL SIGNS: Vitals:   07/17/20 1915 07/17/20 1930  BP:  (!) 207/92  Pulse: 88 96  Resp:  (!) 25  Temp:    SpO2: 94% 94%     Constitutional: Alert and oriented.  Sitting up in bed, hard of hearing, and asking me to help him sit even further forward due to being uncomfortable and short of breath. Eyes: Conjunctivae are normal. PERRL. EOMI. Head: Atraumatic. Nose: No congestion/rhinnorhea. Mouth/Throat: Mucous membranes are moist.  Oropharynx non-erythematous. Neck: No stridor. No cervical spine tenderness to palpation. Cardiovascular:  Normal rate, regular rhythm. Grossly normal heart sounds.  Good peripheral circulation. Respiratory: Tachypneic to about 30.  No retractions.  Bibasilar rhonchi without discrete expiratory wheezes no identified.  Good air movement throughout. Gastrointestinal: Soft , nondistended, nontender to palpation. No CVA tenderness. Musculoskeletal: No lower extremity tenderness.  No joint effusions. No signs of acute trauma. Pitting edema to the bilateral lower extremities up to mid shin without overlying skin changes or signs of trauma. Neurologic:  Normal speech and language. No gross focal neurologic deficits are appreciated.  Skin:  Skin is warm, dry and intact. No rash noted. Psychiatric: Mood and affect are normal. Speech and behavior are normal.  ____________________________________________   LABS (all labs ordered are listed, but only abnormal results are displayed)  Labs Reviewed  BASIC METABOLIC PANEL - Abnormal; Notable for the following components:      Result Value   Sodium 122 (*)    Chloride 90 (*)    Glucose, Bld 131 (*)    Calcium 8.6 (*)    All other components within normal limits  CBC - Abnormal; Notable for the following components:   RBC 3.61 (*)    Hemoglobin 11.0 (*)    HCT 31.7 (*)    All other components within normal limits  BRAIN NATRIURETIC PEPTIDE - Abnormal; Notable for the following components:   B Natriuretic Peptide 202.0 (*)    All other components within normal limits  RESP PANEL BY RT-PCR (FLU A&B, COVID) ARPGX2  CULTURE, BLOOD (ROUTINE X 2)  CULTURE, BLOOD (ROUTINE X 2)  TROPONIN I (HIGH SENSITIVITY)  TROPONIN I (HIGH SENSITIVITY)   ____________________________________________  12 Lead EKG  Sinus rhythm, rate of 87 bpm.  Normal axis.  Normal intervals.  Couple PVCs.  No evidence of acute ischemia. ____________________________________________  RADIOLOGY  ED MD interpretation: 2 view CXR reviewed by me with evidence of RML infiltrate and LLL  infiltrate.  Official radiology report(s): DG Chest 2 View  Result Date: 07/17/2020 CLINICAL DATA:  Shortness of breath. Blood pressure assisted fluctuating. Positive rhonchi. History of hypertension. EXAM: CHEST - 2 VIEW COMPARISON:  Chest x-ray 01/17/2019, CT chest 04/07/2019 FINDINGS: The heart size and mediastinal contours are within normal limits. Aortic arch calcifications. Biapical pleural/pulmonary scarring. Coarsened interstitial markings. Flattening of bilateral hemidiaphragms. Left lower lobe and right middle lobe airspace opacities. Calcified density overlying the right proximal clavicle correlates with vascular calcification noted on CT chest. No focal consolidation. No pulmonary edema. Blunting of bilateral costophrenic angles with possible trace pleural effusions. No pneumothorax. No acute osseous abnormality.  Diffusely decreased bone density. IMPRESSION: Left lower lobe and right middle lobe airspace opacities could represent pneumonia versus inflammation (such as aspiration pneumonia). Possible trace pleural effusions. Followup PA and lateral chest X-ray is recommended in 3-4 weeks following trial therapy to ensure resolution and exclude underlying malignancy. Electronically Signed   By: Tish Frederickson M.D.   On: 07/17/2020 17:55    ____________________________________________   PROCEDURES and INTERVENTIONS  Procedure(s) performed (including Critical Care):  .1-3 Lead EKG Interpretation Performed by: Delton Prairie, MD Authorized by: Delton Prairie, MD     Interpretation: normal     ECG rate:  90   ECG rate assessment: normal     Rhythm: sinus rhythm     Ectopy: PVCs     Conduction: normal      Medications  cefTRIAXone (ROCEPHIN) 1 g in sodium chloride 0.9 % 100 mL IVPB (has no administration in time range)  azithromycin (ZITHROMAX) 500 mg in sodium chloride 0.9 % 250 mL IVPB (has no administration in time range)  furosemide (LASIX) injection 40 mg (has no  administration in time range)  labetalol (NORMODYNE) injection 10 mg (has no administration in time range)    ____________________________________________   MDM / ED COURSE   86 year old man with history of mitral regurgitation presents to the ED with shortness of breath superimposed on 1 week of weakness, consistent with a CHF exacerbation likely caused by his valvular disease, with evidence of superimposed pneumonia requiring antibiotics and medical admission.  Patient hypertensive, for which he received IV labetalol, marginal hypoxia to about 90% on room air necessitating 2 L nasal cannula.  He is otherwise hemodynamically stable.  Exam with evidence of shortness of breath, dyspnea and tachypnea.  He is in no acute distress to necessitate BiPAP at this time.  Blood work demonstrates hyponatremia and elevated BNP.  CXR with evidence of new infiltrates, concerning for CAP in the setting of his increased shortness of breath and cough.  Blood cultures were drawn patient was provided CAP coverage.  Provided patient IV Lasix to assist with diuresis considering his elevated BNP and volume overload clinical status.  Patient tested negative for COVID-19 and we will admit him to hospitalist medicine for further work-up and  management of his respiratory illness.      ____________________________________________   FINAL CLINICAL IMPRESSION(S) / ED DIAGNOSES  Final diagnoses:  Primary hypertension  Shortness of breath  Other congestive heart failure (HCC)  Hyponatremia     ED Discharge Orders    None       Zunaira Lamy   Note:  This document was prepared using [employer] software and may include unintentional dictation errors.   Delton Prairie, MD 07/17/20 [phone]

## 2020-07-17 NOTE — H&P (Signed)
History and Physical    Martin Bean AYT:[id number] DOB: 01-11-1930 DOA: 07/17/2020  PCP: Martin Nurse, MD   Patient coming from: ALF   Chief Complaint: SOB, nausea, malaise, cough    HPI: Martin Bean is a 86 y.o. male with medical history significant for history of CVA, hypertension, chronic diastolic CHF, and depression, now presenting to the emergency department for evaluation of shortness of breath, cough, nausea, and general malaise.  Patient is accompanied by his daughter who assists with the history.  Patient has seemed to have some general weakness and fatigue for the past 1 to 2 weeks per report of his daughter.  Patient reports that he has developed some nausea, shortness of breath, and productive cough over the past couple days.  He denies any fevers, chills, chest pain, or palpitations.  He has slept in a recliner for years and is unable to comment on orthopnea.  He reports some bilateral lower leg swelling that he describes as chronic.  He had not eaten much today, but denies any vomiting, diarrhea, or loss of appetite until today.  Patient denies any headache and his daughter has not noticed any confusion. He denies difficulty swallowing or choking or coughing when trying to eat or drink.   ED Course: Upon arrival to the ED, patient is found to be afebrile, saturating 94% on 2 L/min of supplemental oxygen, tachypneic in the upper 20s, and with blood pressure as high as 207/92.  EKG features sinus rhythm with PVCs.  Chest x-ray demonstrates left lower lobe and right middle lobe opacities concerning for pneumonia.  Chemistry panel features a sodium of 122.  CBC with slight normocytic anemia.  High-sensitivity troponin is normal and BNP is elevated to 202.  COVID-19 PCR is negative.  Patient was given 40 mg IV Lasix, Rocephin, and azithromycin in the ED.  Review of Systems:  All other systems reviewed and apart from HPI, are negative.  Past Medical History:  Diagnosis Date   . Carotid arterial disease (HCC) 10/02/2017  . CVA (cerebral vascular accident) (HCC) 08/14/2017  . Depression   . Diastolic heart failure (HCC)   . Family history of adverse reaction to anesthesia   . HOH (hard of hearing)   . HTN (hypertension)   . Hydropneumothorax 11/01/2017  . Hyperlipidemia 10/09/2017  . Malnutrition of moderate degree 11/02/2017  . Pneumonia   . Prostate enlargement   . Psoriasis     Past Surgical History:  Procedure Laterality Date  . CATARACT EXTRACTION W/PHACO Right 02/25/2018   Procedure: CATARACT EXTRACTION PHACO AND INTRAOCULAR LENS PLACEMENT (IOC);  Surgeon: Galen Manila, MD;  Location: [facility] ORS;  Service: Ophthalmology;  Laterality: Right;  [place] 01:03.2 AP% 17.1 CDE 10.83 Fluid Pack lot #[id number]  . CATARACT EXTRACTION W/PHACO Left 03/18/2018   Procedure: CATARACT EXTRACTION PHACO AND INTRAOCULAR LENS PLACEMENT (IOC);  Surgeon: Galen Manila, MD;  Location: [facility] ORS;  Service: Ophthalmology;  Laterality: Left;  [place] 1.11 AP% 16.7 CDE 11.90 Fluid pack lot # [id number]  . ENDARTERECTOMY Left 10/02/2017   Procedure: ENDARTERECTOMY CAROTID;  Surgeon: Annice Needy, MD;  Location: [facility] ORS;  Service: Vascular;  Laterality: Left;  . HERNIA REPAIR      Social History:   reports that he quit smoking about 6 years ago. His smoking use included cigarettes. He has never used smokeless tobacco. He reports that he does not drink alcohol and does not use drugs.  No Known Allergies  Family History  Problem Relation Age of  Onset  . Stroke Mother      Prior to Admission medications   Medication Sig Start Date End Date Taking? Authorizing Provider  amLODipine (NORVASC) 5 MG tablet Take 1 tablet (5 mg total) by mouth daily. 12/30/18   Salary, Jetty Duhamel D, MD  aspirin EC 81 MG tablet Take 81 mg by mouth daily.  08/16/17   [provider]  atorvastatin (LIPITOR) 40 MG tablet Take 1 tablet (40 mg total) by mouth daily at 6 PM. 08/15/17   Sudini, Wardell Heath, MD   cyanocobalamin (,VITAMIN B-12,) 1000 MCG/ML injection Inject into the muscle. 02/26/18   [provider]  feeding supplement, ENSURE ENLIVE, (ENSURE ENLIVE) LIQD Take 237 mLs by mouth 2 (two) times daily between meals. Patient not taking: Reported on 03/17/2019 11/06/17   Adrian Saran, MD  sertraline (ZOLOFT) 100 MG tablet Take 100 mg by mouth daily. 10/16/18 10/16/19  [provider]  tamsulosin (FLOMAX) 0.4 MG CAPS capsule Take 0.4 mg by mouth daily.  06/03/17   [provider]    Physical Exam: Vitals:   07/17/20 1945 07/17/20 2000 07/17/20 2015 07/17/20 2030  BP:  (!) 152/74  (!) 148/82  Pulse: 88 83 99 89  Resp: (!) 33 (!) 27 (!) 35   Temp:      TempSrc:      SpO2: 95% 94% 94% 94%    Constitutional: NAD, calm  Eyes: PERTLA, lids and conjunctivae normal ENMT: Mucous membranes are moist. Posterior pharynx clear of any exudate or lesions.   Neck: normal, supple, no masses, no thyromegaly Respiratory: tachypneic, rhonchi bialterally, no wheezing. No pallor or cyanosis.  Cardiovascular: S1 & S2 heard, regular rate and rhythm. Pitting edema involving bilateral lower legs and ankles.   Abdomen: No distension, no tenderness, soft. Bowel sounds active.  Musculoskeletal: no clubbing / cyanosis. No joint deformity upper and lower extremities.   Skin: no significant rashes, lesions, ulcers. Warm, dry, well-perfused. Neurologic: No facial asymmetry. Gross hearing deficit. Sensation intact. Moving all extremities.  Psychiatric: Alert and oriented to person, place, and situation. Calm and cooperative.    Labs and Imaging on Admission: I have personally reviewed following labs and imaging studies  CBC: Recent Labs  Lab 07/17/20 1717  WBC 8.8  HGB 11.0*  HCT 31.7*  MCV 87.8  PLT 255   Basic Metabolic Panel: Recent Labs  Lab 07/17/20 1717  NA 122*  K 4.5  CL 90*  CO2 23  GLUCOSE 131*  BUN 13  CREATININE 0.64  CALCIUM 8.6*   GFR: CrCl cannot be  calculated (Unknown ideal weight.). Liver Function Tests: No results for input(s): AST, ALT, ALKPHOS, BILITOT, PROT, ALBUMIN in the last 168 hours. No results for input(s): LIPASE, AMYLASE in the last 168 hours. No results for input(s): AMMONIA in the last 168 hours. Coagulation Profile: No results for input(s): INR, PROTIME in the last 168 hours. Cardiac Enzymes: No results for input(s): CKTOTAL, CKMB, CKMBINDEX, TROPONINI in the last 168 hours. BNP (last 3 results) No results for input(s): PROBNP in the last 8760 hours. HbA1C: No results for input(s): HGBA1C in the last 72 hours. CBG: No results for input(s): GLUCAP in the last 168 hours. Lipid Profile: No results for input(s): CHOL, HDL, LDLCALC, TRIG, CHOLHDL, LDLDIRECT in the last 72 hours. Thyroid Function Tests: No results for input(s): TSH, T4TOTAL, FREET4, T3FREE, THYROIDAB in the last 72 hours. Anemia Panel: No results for input(s): VITAMINB12, FOLATE, FERRITIN, TIBC, IRON, RETICCTPCT in the last 72 hours. Urine analysis:  Component Value Date/Time   COLORURINE YELLOW (A) 01/18/2019 1023   APPEARANCEUR CLEAR (A) 01/18/2019 1023   LABSPEC 1.010 01/18/2019 1023   PHURINE 6.0 01/18/2019 1023   GLUCOSEU NEGATIVE 01/18/2019 1023   HGBUR NEGATIVE 01/18/2019 1023   BILIRUBINUR NEGATIVE 01/18/2019 1023   KETONESUR NEGATIVE 01/18/2019 1023   PROTEINUR NEGATIVE 01/18/2019 1023   NITRITE NEGATIVE 01/18/2019 1023   LEUKOCYTESUR NEGATIVE 01/18/2019 1023   Sepsis Labs: @LABRCNTIP (procalcitonin:4,lacticidven:4) ) Recent Results (from the past 240 hour(s))  Resp Panel by RT-PCR (Flu A&B, Covid) Nasopharyngeal Swab     Status: None   Collection Time: 07/17/20  6:26 PM   Specimen: Nasopharyngeal Swab; Nasopharyngeal(NP) swabs in vial transport medium  Result Value Ref Range Status   SARS Coronavirus 2 by RT PCR NEGATIVE NEGATIVE Final    Comment: (NOTE) SARS-CoV-2 target nucleic acids are NOT DETECTED.  The SARS-CoV-2 RNA is  generally detectable in upper respiratory specimens during the acute phase of infection. The lowest concentration of SARS-CoV-2 viral copies this assay can detect is 138 copies/mL. A negative result does not preclude SARS-Cov-2 infection and should not be used as the sole basis for treatment or other patient management decisions. A negative result may occur with  improper specimen collection/handling, submission of specimen other than nasopharyngeal swab, presence of viral mutation(s) within the areas targeted by this assay, and inadequate number of viral copies(<138 copies/mL). A negative result must be combined with clinical observations, patient history, and epidemiological information. The expected result is Negative.  Fact Sheet for Patients:  07/19/20  Fact Sheet for Healthcare Providers:  BloggerCourse.com  This test is no t yet approved or cleared by the [employer] FDA and  has been authorized for detection and/or diagnosis of SARS-CoV-2 by FDA under an Emergency Use Authorization (EUA). This EUA will remain  in effect (meaning this test can be used) for the duration of the COVID-19 declaration under Section 564(b)(1) of the Act, 21 U.S.C.section 360bbb-3(b)(1), unless the authorization is terminated  or revoked sooner.       Influenza A by PCR NEGATIVE NEGATIVE Final   Influenza B by PCR NEGATIVE NEGATIVE Final    Comment: (NOTE) The Xpert Xpress SARS-CoV-2/FLU/RSV plus assay is intended as an aid in the diagnosis of influenza from Nasopharyngeal swab specimens and should not be used as a sole basis for treatment. Nasal washings and aspirates are unacceptable for Xpert Xpress SARS-CoV-2/FLU/RSV testing.  Fact Sheet for Patients: [place]  Fact Sheet for Healthcare Providers: BloggerCourse.com  This test is not yet approved or cleared by the [employer]ious[employer]ker.[place] FDA and has been authorized for detection and/or diagnosis of SARS-CoV-2 by FDA under an Emergency Use Authorization (EUA). This EUA will remain in effect (meaning this test can be used) for the duration of the COVID-19 declaration under Section 564(b)(1) of the Act, 21 U.S.C. section 360bbb-3(b)(1), unless the authorization is terminated or revoked.  Performed at [facility], [address], [place], [place]      Radiological Exams on Admission: DG Chest 2 View  Result Date: 07/17/2020 CLINICAL DATA:  Shortness of breath. Blood pressure assisted fluctuating. Positive rhonchi. History of hypertension. EXAM: CHEST - 2 VIEW COMPARISON:  Chest x-ray 01/17/2019, CT chest 04/07/2019 FINDINGS: The heart size and mediastinal contours are within normal limits. Aortic arch calcifications. Biapical pleural/pulmonary scarring. Coarsened interstitial markings. Flattening of bilateral hemidiaphragms. Left lower lobe and right middle lobe airspace opacities. Calcified density overlying the right proximal clavicle correlates with vascular calcification noted on  CT chest. No focal consolidation. No pulmonary edema. Blunting of bilateral costophrenic angles with possible trace pleural effusions. No pneumothorax. No acute osseous abnormality.  Diffusely decreased bone density. IMPRESSION: Left lower lobe and right middle lobe airspace opacities could represent pneumonia versus inflammation (such as aspiration pneumonia). Possible trace pleural effusions. Followup PA and lateral chest X-ray is recommended in 3-4 weeks following trial therapy to ensure resolution and exclude underlying malignancy. Electronically Signed   By: Tish FredericksonMorgane  Naveau M.D.   On: 07/17/2020 17:55    EKG: Independently reviewed. Sinus rhythm, PVCs.   Assessment/Plan   1. CAP; acute hypoxic respiratory failure  - Presents with productive cough and SOB, was started on supplemental O2 prior to arrival and is  saturating low-mid 90s on 2 Lpm, and has CXR findings concerning for PNA   - Blood cultures collected in ED and patient was started on Rocephin and azithromycin  - Check sputum culture and strep pneumo and legionella antigens, check/tend procalcitonin, continue Rocephin and azithromycin, and continue supplemental O2 as needed    2. Hyponatremia  - Serum sodium is 122 on admission with no recent chem panels available for comparison  - Patient reports nausea and general weakness but no headache or severe symptoms  - Possibly related to CHF though he describes his leg swelling as chronic and is not grossly hypervolemic; pneumonia and/or Zoloft are also potential etiologies  - Check urine sodium and urine osm, follow serial chem panels, hold further diuresis pending repeat chem panel, hold Zolft, restrict fluids    3. Hypertensive urgency   - BP as high as 207/92 in ED  - Anticipate improvement with diuresis  - Use IV labetalol as needed    4. Acute on chronic diastolic CHF - Patient presents with SOB that is suspected to be primarily related to CAP but has elevated BNP (previously had been normal)   - There is b/l leg swelling that he describes as chronic  - EF was 55-60% in 2018 with grade I diastolic dysfunction, mild-moderate MR, and mild LAE  - He was given Lasix 40 mg IV in ED  - Plan to update echo, restrict fluids, hold further diuretic for now pending repeat chemistry panel    5. Depression  - Hold Zoloft in light of hyponatremia     DVT prophylaxis: Lovenox  Code Status: Full  Family Communication: Daughter updated at bedside  Disposition Plan:  Patient is from: ALF Anticipated d/c is to: TBD Anticipated d/c date is: 07/20/20 Patient currently: Pending echocardiogram, improvement in respiratory status and hyponatremia  Consults called: None  Admission status: Inpatient     Briscoe Deutscherimothy S Rolanda Campa, MD [facility]  07/17/2020, 8:42 PM

## 2020-07-17 NOTE — ED Notes (Signed)
Pt assisted to bathroom by EDT Ariel.  

## 2020-07-17 NOTE — ED Triage Notes (Signed)
Pt arrived via [facility] from [facility], pt reports to not have been feeling well today, per EMS staff reported pt's BP has been fluctuating. Lung sounds + Rhonchi.  Pt also has BLE Pt placed on 2L [place].

## 2020-07-18 ENCOUNTER — Inpatient Hospital Stay
Admit: 2020-07-18 | Discharge: 2020-07-18 | Disposition: A | Discharge status: Home / Self Care | Payer: Medicare HMO | Attending: Family Medicine | Admitting: Family Medicine

## 2020-07-18 ENCOUNTER — Inpatient Hospital Stay: Payer: Medicare HMO

## 2020-07-18 ENCOUNTER — Encounter: Payer: Self-pay | Admitting: Family Medicine

## 2020-07-18 DIAGNOSIS — I5033 Acute on chronic diastolic (congestive) heart failure: Secondary | ICD-10-CM | POA: Diagnosis not present

## 2020-07-18 LAB — BASIC METABOLIC PANEL
Anion gap: 11 (ref 5–15)
Anion gap: 8 (ref 5–15)
Anion gap: 9 (ref 5–15)
BUN: 13 mg/dL (ref 8–23)
BUN: 13 mg/dL (ref 8–23)
BUN: 14 mg/dL (ref 8–23)
CO2: 22 mmol/L (ref 22–32)
CO2: 23 mmol/L (ref 22–32)
CO2: 23 mmol/L (ref 22–32)
Calcium: 8.3 mg/dL — ABNORMAL LOW (ref 8.9–10.3)
Calcium: 8.5 mg/dL — ABNORMAL LOW (ref 8.9–10.3)
Calcium: 8.6 mg/dL — ABNORMAL LOW (ref 8.9–10.3)
Chloride: 86 mmol/L — ABNORMAL LOW (ref 98–111)
Chloride: 87 mmol/L — ABNORMAL LOW (ref 98–111)
Chloride: 90 mmol/L — ABNORMAL LOW (ref 98–111)
Creatinine, Ser: 0.65 mg/dL (ref 0.61–1.24)
Creatinine, Ser: 0.71 mg/dL (ref 0.61–1.24)
Creatinine, Ser: 0.76 mg/dL (ref 0.61–1.24)
GFR, Estimated: >60 mL/min (ref >60)
GFR, Estimated: >60 mL/min (ref >60)
GFR, Estimated: >60 mL/min (ref >60)
Glucose, Bld: 128 mg/dL — ABNORMAL HIGH (ref 70–99)
Glucose, Bld: 137 mg/dL — ABNORMAL HIGH (ref 70–99)
Glucose, Bld: 155 mg/dL — ABNORMAL HIGH (ref 70–99)
Potassium: 3.9 mmol/L (ref 3.5–5.1)
Potassium: 4 mmol/L (ref 3.5–5.1)
Potassium: 4.1 mmol/L (ref 3.5–5.1)
Sodium: 119 mmol/L — CL (ref 135–145)
Sodium: 119 mmol/L — CL (ref 135–145)
Sodium: 121 mmol/L — ABNORMAL LOW (ref 135–145)

## 2020-07-18 LAB — ECHOCARDIOGRAM COMPLETE
AR max vel: 1.62 cm2
AV Area VTI: 2 cm2
AV Area mean vel: 1.64 cm2
AV Mean grad: 5.3 mmHg
AV Peak grad: 10 mmHg
Ao pk vel: 1.58 m/s
Area-P 1/2: 3.99 cm2
Height: 69 in
S' Lateral: 2.75 cm
Weight: 2447.99 oz

## 2020-07-18 LAB — OSMOLALITY: Osmolality: 258 mOsm/kg — ABNORMAL LOW (ref 275–295)

## 2020-07-18 LAB — TSH: TSH: 3.044 u[IU]/mL (ref 0.350–4.500)

## 2020-07-18 LAB — PROCALCITONIN: Procalcitonin: <0.1 ng/mL

## 2020-07-18 MED ORDER — PANTOPRAZOLE SODIUM 40 MG PO TBEC
40.0000 mg | DELAYED_RELEASE_TABLET | Freq: Every day | ORAL | Status: DC
  Administered 2020-07-18 – 2020-07-29 (×12): 40 mg via ORAL
  Filled 2020-07-18 (×12): qty 1

## 2020-07-18 MED ORDER — ENOXAPARIN SODIUM 40 MG/0.4ML ~~LOC~~ SOLN
40.0000 mg | SUBCUTANEOUS | Status: DC
  Administered 2020-07-18 – 2020-07-28 (×11): 40 mg via SUBCUTANEOUS
  Filled 2020-07-18 (×12): qty 0.4

## 2020-07-18 MED ORDER — UMECLIDINIUM BROMIDE 62.5 MCG/INH IN AEPB
1.0000 | INHALATION_SPRAY | Freq: Every day | RESPIRATORY_TRACT | Status: DC
  Administered 2020-07-20 – 2020-07-29 (×10): 1 via RESPIRATORY_TRACT
  Filled 2020-07-18 (×2): qty 7

## 2020-07-18 MED ORDER — GUAIFENESIN ER 600 MG PO TB12
600.0000 mg | ORAL_TABLET | Freq: Every day | ORAL | Status: DC
  Administered 2020-07-18 – 2020-07-29 (×13): 600 mg via ORAL
  Filled 2020-07-18 (×12): qty 1

## 2020-07-18 MED ORDER — TAMSULOSIN HCL 0.4 MG PO CAPS
0.4000 mg | ORAL_CAPSULE | Freq: Every day | ORAL | Status: DC
  Administered 2020-07-18 – 2020-07-29 (×12): 0.4 mg via ORAL
  Filled 2020-07-18 (×12): qty 1

## 2020-07-18 MED ORDER — FLUTICASONE-UMECLIDIN-VILANT 100-62.5-25 MCG/INH IN AEPB: 1.0000 | INHALATION_SPRAY | Freq: Every day | RESPIRATORY_TRACT | Status: DC

## 2020-07-18 MED ORDER — ATORVASTATIN CALCIUM 20 MG PO TABS
40.0000 mg | ORAL_TABLET | Freq: Every day | ORAL | Status: DC
  Administered 2020-07-18 – 2020-07-22 (×5): 40 mg via ORAL
  Filled 2020-07-18 (×5): qty 2

## 2020-07-18 MED ORDER — FLUTICASONE FUROATE-VILANTEROL 100-25 MCG/INH IN AEPB
1.0000 | INHALATION_SPRAY | Freq: Every day | RESPIRATORY_TRACT | Status: DC
  Administered 2020-07-20 – 2020-07-29 (×10): 1 via RESPIRATORY_TRACT
  Filled 2020-07-18 (×2): qty 28

## 2020-07-18 MED ORDER — ASPIRIN EC 81 MG PO TBEC
81.0000 mg | DELAYED_RELEASE_TABLET | Freq: Every day | ORAL | Status: DC
  Administered 2020-07-18 – 2020-07-29 (×12): 81 mg via ORAL
  Filled 2020-07-18 (×12): qty 1

## 2020-07-18 MED ORDER — ALBUTEROL SULFATE HFA 108 (90 BASE) MCG/ACT IN AERS
2.0000 | INHALATION_SPRAY | Freq: Two times a day (BID) | RESPIRATORY_TRACT | Status: DC | PRN
  Filled 2020-07-18 (×2): qty 6.7

## 2020-07-18 MED ORDER — GUAIFENESIN ER 600 MG PO TB12
600.0000 mg | ORAL_TABLET | Freq: Two times a day (BID) | ORAL | Status: DC | PRN
  Administered 2020-07-19 – 2020-07-25 (×2): 600 mg via ORAL
  Filled 2020-07-18 (×2): qty 1

## 2020-07-18 NOTE — ED Notes (Signed)
Admitting MD notified of pt sodium level at this time. Admitting MD stated bedside swallow eval can be done to tolerate sips with meds at this time.

## 2020-07-18 NOTE — Evaluation (Signed)
Clinical/Bedside Swallow Evaluation Patient Details  Name: Martin Bean MRN: [id number] Date of Birth: 1930-06-01  Today's Date: 07/18/2020 Time: SLP Start Time (ACUTE ONLY): 1035 SLP Stop Time (ACUTE ONLY): 1135 SLP Time Calculation (min) (ACUTE ONLY): 60 min  Past Medical History:  Past Medical History:  Diagnosis Date  . Carotid arterial disease (HCC) 10/02/2017  . CVA (cerebral vascular accident) (HCC) 08/14/2017  . Depression   . Diastolic heart failure (HCC)   . Family history of adverse reaction to anesthesia   . HOH (hard of hearing)   . HTN (hypertension)   . Hydropneumothorax 11/01/2017  . Hyperlipidemia 10/09/2017  . Malnutrition of moderate degree 11/02/2017  . Pneumonia   . Prostate enlargement   . Psoriasis    Past Surgical History:  Past Surgical History:  Procedure Laterality Date  . CATARACT EXTRACTION W/PHACO Right 02/25/2018   Procedure: CATARACT EXTRACTION PHACO AND INTRAOCULAR LENS PLACEMENT (IOC);  Surgeon: Galen Manila, MD;  Location: [facility] ORS;  Service: Ophthalmology;  Laterality: Right;  [place] 01:03.2 AP% 17.1 CDE 10.83 Fluid Pack lot #[id number]  . CATARACT EXTRACTION W/PHACO Left 03/18/2018   Procedure: CATARACT EXTRACTION PHACO AND INTRAOCULAR LENS PLACEMENT (IOC);  Surgeon: Galen Manila, MD;  Location: [facility] ORS;  Service: Ophthalmology;  Laterality: Left;  [place] 1.11 AP% 16.7 CDE 11.90 Fluid pack lot # [id number]  . ENDARTERECTOMY Left 10/02/2017   Procedure: ENDARTERECTOMY CAROTID;  Surgeon: Annice Needy, MD;  Location: [facility] ORS;  Service: Vascular;  Laterality: Left;  . HERNIA REPAIR     HPI:  Pt is a 86 y.o. male with medical history significant for history of HOH w/ aid, prostate enlargement, Malnutrition of moderate degree, hypertension, chronic diastolic CHF, CVA, and depression, now presenting to the emergency department for evaluation of shortness of breath, cough, nausea, and general malaise.  Patient is accompanied by his daughter who  assists with the history.  Patient has seemed to have some general weakness and fatigue for the past 1 to 2 weeks per report of his daughter.  Patient reports that he has developed some nausea, shortness of breath, and productive cough over the past couple days.  He denies any fevers, chills, chest pain, or palpitations.  He has slept in a recliner for years and is unable to comment on orthopnea.  He reports some bilateral lower leg swelling that he describes as chronic.  Upon arrival to the ED, patient is found to be afebrile, saturating 94% on 2 L/min of supplemental oxygen, tachypneic in the upper 20s, and with blood pressure as high as 207/92.  EKG features sinus rhythm with PVCs.  Chest x-ray demonstrates left lower lobe and right middle lobe opacities concerning for pneumonia.  Chemistry panel features a sodium of 122.  CBC with slight normocytic anemia.  High-sensitivity troponin is normal and BNP is elevated to 202.  COVID-19 PCR is negative.  Patient was given 40 mg IV Lasix, Rocephin, and azithromycin in the ED.  Patient resides a local SNF, [place].  He stated he had been "pureeing my foods" for easier eating/mastication d/t Edentulous status but w/ some chopped foods too. He also endorsed drinking Ensure as a Supplement. Pt is HOH w/ aid.   Assessment / Plan / Recommendation Clinical Impression  Pt appears to present w/ grossly functional oropharyngeal phase swallowing during bedside evaluation today. No overt, clinical s/s of aspiration noted during/post oral intake. Pt appears at reduced risk for aspiration w/ oral intake following swallowing precautions, strategies. Pt is easily  SOB at Baseline w/ WOB w/ ANY exertion including Talking and required frequent rest breaks during po's to lessen WOB. Pt is on [place] O2 at Baseline. Pt has a MBSS in 01/2019 which revealed "fairly adequate oropharyngeal swallow function w/ No gross oropharyngeal phase dysphagia noted during this study - in light of  pt's baseline. NO Aspiration or Penetration occurred during this study today." Pt had been Pureeing his foods more d/t Edentulous status at Baseline. At the [facility] [place], he has been taking more of a Chopped foods diet per pt/Dtr(present) -- "he knows what he can handle". During the MBSS in 01/2019, during the Pharyngo-Esophageal phase of swallowing, bolus material did not completely clear the UES (consistently) thus a slight amount of residue remained above. This was not consistent w/ liquid consistencies but moreso w/ food consistency(trials). When pt used a f/u, Dry swallow strategy, this reduced/cleared the residue. Unsure of related to Esophageal/UES deficits. Pt was rec'd to follow swallowing strategies and aspiration precautions post the MBSS. No recent Pulmonary deficits since that time per Daughter until this admit.  During po trials, pt consumed consistencies w/ no overt coughing, decline in vocal quality, or change in respiratory presentation during/post trials. Pt consumed ~6 ozs of thin liquids VIA CUP ONLY following instructions for Rest Breaks to lessen WOB w/ exertion of task. Oral phase appeared [facility] w/ timely bolus management and control of bolus propulsion for A-P transfer for swallowing of liquids and purees. Oral clearing achieved w/ all trial consistencies. OM Exam appeared [facility] w/ no unilateral weakness noted. Speech Clear. Pt fed self w/ setup support.  Recommend a more MINCED consistency diet w/ moistened foods; Thin liquids VIA CUP. Recommend strict aspiration precautions, Pills WHOLE in Puree for safer, easier swallowing. Education given on Pills in Puree; food consistencies and easy to eat options; aspiration precautions.  NSG agreed. ST services will f/u w/ education w/ pt/Dtr while admitted.  SLP Visit Diagnosis: Dysphagia, oropharyngeal phase (R13.12) (Baseline Pulmonary decline; pharyngoesophageal dysmotility?)    Aspiration Risk  Mild aspiration risk;Risk for inadequate  nutrition/hydration    Diet Recommendation  Dysphagia level 2 (MINCED foods w/ gravies; added Puree foods), Thin liquids VIA CUP ONLY. Strict aspiration precautions, Reflux precautions. Rest Breaks during meals to lessen WOB/SOB w/ exertion of meals.   Medication Administration: Whole meds with puree (for safer swallowing)    Other  Recommendations Recommended Consults:  (TBD; Dietician f/u for support) Oral Care Recommendations: Oral care BID;Oral care before and after PO;Staff/trained caregiver to provide oral care Other Recommendations:  (n/a)   Follow up Recommendations None      Frequency and Duration min 1 x/week  1 week       Prognosis Prognosis for Safe Diet Advancement: Fair Barriers to Reach Goals: Time post onset;Severity of deficits      Swallow Study   General Date of Onset: 07/17/20 HPI: Pt is a 86 y.o. male with medical history significant for history of HOH w/ aid, prostate enlargement, Malnutrition of moderate degree, hypertension, chronic diastolic CHF, CVA, and depression, now presenting to the emergency department for evaluation of shortness of breath, cough, nausea, and general malaise.  Patient is accompanied by his daughter who assists with the history.  Patient has seemed to have some general weakness and fatigue for the past 1 to 2 weeks per report of his daughter.  Patient reports that he has developed some nausea, shortness of breath, and productive cough over the past couple days.  He denies any fevers,  chills, chest pain, or palpitations.  He has slept in a recliner for years and is unable to comment on orthopnea.  He reports some bilateral lower leg swelling that he describes as chronic.  Upon arrival to the ED, patient is found to be afebrile, saturating 94% on 2 L/min of supplemental oxygen, tachypneic in the upper 20s, and with blood pressure as high as 207/92.  EKG features sinus rhythm with PVCs.  Chest x-ray demonstrates left lower lobe and right middle  lobe opacities concerning for pneumonia.  Chemistry panel features a sodium of 122.  CBC with slight normocytic anemia.  High-sensitivity troponin is normal and BNP is elevated to 202.  COVID-19 PCR is negative.  Patient was given 40 mg IV Lasix, Rocephin, and azithromycin in the ED.  Patient resides a local SNF, [place].  He stated he had been "pureeing my foods" for easier eating/mastication d/t Edentulous status but w/ some chopped foods too. He also endorsed drinking Ensure as a Supplement.  Type of Study: Bedside Swallow Evaluation Previous Swallow Assessment: MBSS 01/2019 Diet Prior to this Study: Dysphagia 2 (chopped);Thin liquids (rec'd purees previously per his report) Temperature Spikes Noted: No (wbc 8.8) Respiratory Status: Nasal cannula (2L) History of Recent Intubation: No Behavior/Cognition: Alert;Cooperative;Pleasant mood;Distractible;Requires cueing (HOH) Oral Cavity Assessment: Dry (min) Oral Care Completed by SLP: Yes Oral Cavity - Dentition: Edentulous Vision: Functional for self-feeding Self-Feeding Abilities: Able to feed self;Needs assist;Needs set up Patient Positioning: Upright in bed (needed positioning) Baseline Vocal Quality: Normal Volitional Cough: Strong Volitional Swallow: Able to elicit    Oral/Motor/Sensory Function Overall Oral Motor/Sensory Function: Within functional limits   Ice Chips Ice chips: Within functional limits Presentation: Spoon (fed; 2 trials)   Thin Liquid Thin Liquid: Within functional limits Presentation: Cup;Self Fed (~6 ozs total -- mostly single sips at a time)    Nectar Thick Nectar Thick Liquid: Not tested   Honey Thick Honey Thick Liquid: Not tested   Puree Puree: Within functional limits Presentation: Spoon;Self Fed (assisted; 10 trials)   Solid     Solid: Not tested        Jerilynn Som, MS, CCC-SLP Speech Language Pathologist [facility] [phone] Mayerly Kaman 07/18/2020,3:41 PM

## 2020-07-18 NOTE — ED Notes (Signed)
Admitting MD at bedside.

## 2020-07-18 NOTE — TOC Initial Note (Signed)
Transition of Care [facility]) - Initial/Assessment Note    Patient Details  Name: Martin Bean MRN: [id number] Date of Birth: 02-29-1930  Transition of [facility]) CM/SW Contact:    Marina Goodell Phone Number: [phone] 07/18/2020, 1:20 PM  Clinical Narrative:                  Patient presents to the ED due to SOB.  Patient is hard of hearing and CSW spoke with his daughter [name] [phone].  CSW explained TOC role in patient care. Ms. Orlena Sheldon stated the patient currently lives at [facility] and is able to perform all ADLs without assistance. Patient walks with a walker through out the day and is continent.  Patient receives PCP care from [facility] in house PA and is able to take medications without assistance.  CSW asked Ms. Pendergraph if she or the patient had any questions about disposition process, and they both stated they did not.    Expected Discharge Plan: Skilled Nursing Facility Barriers to Discharge: Continued Medical Work up   Patient Goals and CMS Choice   CMS Medicare.gov Compare Post Acute Care list provided to:: Patient Represenative (must comment) Orlena Sheldon, [name] (Daughter) [phone]) Choice offered to / list presented to : Adult Children Orlena Sheldon, [name] (Daughter) [phone])  Expected Discharge Plan and Services Expected Discharge Plan: Skilled Nursing Facility In-house Referral: Clinical Social Work   Post Acute Care Choice: Skilled Nursing Facility Living arrangements for the past 2 months: Assisted Living Facility [facility])                                      Prior Living Arrangements/Services Living arrangements for the past 2 months: Assisted Living Facility ([facility]) Lives with:: Facility Resident Patient language and need for interpreter reviewed:: Yes (Patient is hard of hearing.) Do you feel safe going back to the place where you live?: Yes      Need for Family Participation  in Patient Care: Yes (Comment) Care giver support system in place?: Yes (comment)   Criminal Activity/Legal Involvement Pertinent to Current Situation/Hospitalization: No - Comment as needed  Activities of Daily Living      Permission Sought/Granted Permission sought to share information with : Family Supports Orlena Sheldon, [name] (Daughter) [phone]) Permission granted to share information with : Yes, Verbal Permission Granted  Share Information with NAME: [name] (Daughter) [phone]  Permission granted to share info w AGENCY: [facility]        Emotional Assessment Appearance:: Appears stated age Attitude/Demeanor/Rapport: Gracious Affect (typically observed): Stable Orientation: : Oriented to Self, Oriented to Place, Oriented to Situation Alcohol / Substance Use: Not Applicable Psych Involvement: No (comment)  Admission diagnosis:  Acute on chronic diastolic CHF (congestive heart failure) (HCC) [I50.33] Patient Active Problem List   Diagnosis Date Noted  . Acute on chronic diastolic CHF (congestive heart failure) (HCC) 07/17/2020  . Hyponatremia 07/17/2020  . Hypertensive urgency 07/17/2020  . Bilateral pneumonia 01/17/2019  . CAP (community acquired pneumonia) 12/28/2018  . Acute renal failure (ARF) (HCC) 12/28/2018  . Pneumonia 12/28/2018  . Hemothorax   . Malnutrition of moderate degree 11/02/2017  . Hydropneumothorax 11/01/2017  . Essential hypertension 10/09/2017  . Hyperlipidemia 10/09/2017  . Carotid arterial disease (HCC) 10/02/2017  . Carotid artery stenosis, unilateral, left 09/04/2017  . CVA (cerebral vascular accident) (HCC) 08/14/2017  . Benign prostatic hyperplasia with  urinary frequency 06/03/2017  . Depression, prolonged 06/03/2017  . 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 Psoriasis, unspecified 06/03/2017   PCP:  Gracelyn Nurse, MD Pharmacy:   [facility] [address] [place] - [address] [place]  [address] Phone: [phone] Fax: [phone]     Social Determinants of Health (SDOH) Interventions    Readmission Risk Interventions Readmission Risk Prevention Plan 01/19/2019  Transportation Screening Complete  PCP or Specialist Appt within 5-7 Days Complete  Home Care Screening Complete  Medication Review (RN CM) Complete  Some recent data might be hidden

## 2020-07-18 NOTE — ED Notes (Signed)
Paging MD at this time to update on pt status

## 2020-07-18 NOTE — ED Notes (Signed)
This RN assessed pt's external catheter for leaking per patient request, no leaking noted, sheets and pad dry.

## 2020-07-18 NOTE — ED Notes (Signed)
Pt assisted with eating apple sauce and provided with iced water.

## 2020-07-18 NOTE — ED Notes (Signed)
Pt cleaned up after small bowel movement at this time. Pt placed in clean brief and clean chux placed at this time.

## 2020-07-18 NOTE — Progress Notes (Signed)
PROGRESS NOTE    Martin HaverJasper L Bean  RUE:[id number] DOB: 02/07/1930 DOA: 07/17/2020 PCP: [name], [name], MD   Brief Narrative:  HPI: Martin Bean is a 86 y.o. male with medical history significant for history of CVA, hypertension, chronic diastolic CHF, and depression, now presenting to the emergency department for evaluation of shortness of breath, cough, nausea, and general malaise.  Patient is accompanied by his daughter who assists with the history.  Patient has seemed to have some general weakness and fatigue for the past 1 to 2 weeks per report of his daughter.  Patient reports that he has developed some nausea, shortness of breath, and productive cough over the past couple days.  He denies any fevers, chills, chest pain, or palpitations.  He has slept in a recliner for years and is unable to comment on orthopnea.  He reports some bilateral lower leg swelling that he describes as chronic.  He had not eaten much today, but denies any vomiting, diarrhea, or loss of appetite until today.  Patient denies any headache and his daughter has not noticed any confusion. He denies difficulty swallowing or choking or coughing when trying to eat or drink.   ED Course: Upon arrival to the ED, patient is found to be afebrile, saturating 94% on 2 L/min of supplemental oxygen, tachypneic in the upper 20s, and with blood pressure as high as 207/92.  EKG features sinus rhythm with PVCs.  Chest x-ray demonstrates left lower lobe and right middle lobe opacities concerning for pneumonia.  Chemistry panel features a sodium of 122.  CBC with slight normocytic anemia.  High-sensitivity troponin is normal and BNP is elevated to 202.  COVID-19 PCR is negative.  Patient was given 40 mg IV Lasix, Rocephin, and azithromycin in the ED.  Assessment & Plan:   Principal Problem:   Acute on chronic diastolic CHF (congestive heart failure) (HCC) Active Problems:   Depression, prolonged   CAP (community acquired pneumonia)    Hyponatremia   Hypertensive urgency   1.  Acute hypoxic respiratory failure secondary to CAP vs aspiration pneumonia: Does not use any oxygen at home.  Procalcitonin unremarkable.  Currently on 2 L.  Based on clinical exam, he does seem to have problems swallowing and I suspect aspiration pneumonia.  Will consult SLP for that.  Continue Rocephin and Zithromax.  We will switch to n.p.o. until passes swallow evaluation.  2. Hyponatremia due to SIADH: Presented with sodium of 122 and now 121.  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  Continue fluid restriction.  Hold Zoloft.  Monitor daily.  3. Hypertensive urgency: Patient presented with BP as high as 207/92 in ED.  This has improved.  Interestingly, patient is not on any antihypertensives and instead he is on midodrine.  Will watch closely for now.  Labetalol as needed.  4. Acute on chronic diastolic CHF: Patient was presumed to have acute on chronic diastolic CHF.  He received 1 dose of Lasix.  Per note, he had bilateral lower extremity edema but on my examination, he has no edema.  On lung exam, he is very congested.  It is very hard to tell whether he has crackles or not along with rhonchi.  Will hold further diuretics.  Echo pending.  5. Depression  - Hold Zoloft in light of hyponatremia    6.  Hyperlipidemia: Continue atorvastatin.  7.  BPH: Continue Flomax.  DVT prophylaxis:    Code Status: Full Code  Family Communication: None present at bedside.  We will try  to call family later today.  Status is: Inpatient  Remains inpatient appropriate because:Inpatient level of care appropriate due to severity of illness   Dispo: The patient is from: Home              Anticipated d/c is to: SNF              Anticipated d/c date is: 2 days              Patient currently is not medically stable to d/c.        Estimated body mass index is 22.59 kg/m as calculated from the following:   Height as of this encounter: 5\' 9"  (1.753 m).   Weight as of  this encounter: 69.4 kg.      Nutritional status:               Consultants:   None  Procedures:   None  Antimicrobials:  Anti-infectives (From admission, onward)   Start     Dose/Rate Route Frequency Ordered Stop   07/18/20 2000  cefTRIAXone (ROCEPHIN) 2 g in sodium chloride 0.9 % 100 mL IVPB        2 g 200 mL/hr over 30 Minutes Intravenous Every 24 hours 07/17/20 1958 07/22/20 1959   07/18/20 2000  azithromycin (ZITHROMAX) 500 mg in sodium chloride 0.9 % 250 mL IVPB        500 mg 250 mL/hr over 60 Minutes Intravenous Every 24 hours 07/17/20 1958 07/22/20 1959   07/17/20 1900  cefTRIAXone (ROCEPHIN) 1 g in sodium chloride 0.9 % 100 mL IVPB        1 g 200 mL/hr over 30 Minutes Intravenous  Once 07/17/20 1858 07/17/20 2030   07/17/20 1900  azithromycin (ZITHROMAX) 500 mg in sodium chloride 0.9 % 250 mL IVPB        500 mg 250 mL/hr over 60 Minutes Intravenous  Once 07/17/20 1858 07/17/20 2058         Subjective: Seen and examined in the ED.  Patient still complains of shortness of breath.  He seems to be very weak but oriented.  No other complaint.  Objective: Vitals:   07/18/20 0630 07/18/20 0645 07/18/20 0730 07/18/20 0739  BP: (!) 172/69  (!) 172/76   Pulse: 84 76 82   Resp: (!) 31 (!) 30 (!) 27   Temp:      TempSrc:      SpO2: 98% 93% 93%   Weight:    69.4 kg  Height:    5\' 9"  (1.753 m)    Intake/Output Summary (Last 24 hours) at 07/18/2020 0825 Last data filed at 07/18/2020 0455 Gross per 24 hour  Intake --  Output 925 ml  Net -925 ml   Filed Weights   07/17/20 2208 07/18/20 0739  Weight: 69.4 kg 69.4 kg    Examination:  General exam: Appears calm and comfortable but very weak Respiratory system: Rhonchi bilaterally. Respiratory effort normal. Cardiovascular system: S1 & S2 heard, RRR. No JVD, murmurs, rubs, gallops or clicks. No pedal edema. Gastrointestinal system: Abdomen is nondistended, soft and nontender. No organomegaly or masses  felt. Normal bowel sounds heard. Central nervous system: Alert and oriented. No focal neurological deficits. Extremities: Symmetric 5 x 5 power. Skin: No rashes, lesions or ulcers    Data Reviewed: I have personally reviewed following labs and imaging studies  CBC: Recent Labs  Lab 07/17/20 1717  WBC 8.8  HGB 11.0*  HCT 31.7*  MCV 87.8  PLT  255   Basic Metabolic Panel: Recent Labs  Lab 07/17/20 1717 07/18/20 0006  NA 122* 121*  K 4.5 4.1  CL 90* 90*  CO2 23 23  GLUCOSE 131* 137*  BUN 13 13  CREATININE 0.64 0.65  CALCIUM 8.6* 8.3*   GFR: Estimated Creatinine Clearance: 63.9 mL/min (by C-G formula based on SCr of 0.65 mg/dL). Liver Function Tests: No results for input(s): AST, ALT, ALKPHOS, BILITOT, PROT, ALBUMIN in the last 168 hours. No results for input(s): LIPASE, AMYLASE in the last 168 hours. No results for input(s): AMMONIA in the last 168 hours. Coagulation Profile: No results for input(s): INR, PROTIME in the last 168 hours. Cardiac Enzymes: No results for input(s): CKTOTAL, CKMB, CKMBINDEX, TROPONINI in the last 168 hours. BNP (last 3 results) No results for input(s): PROBNP in the last 8760 hours. HbA1C: No results for input(s): HGBA1C in the last 72 hours. CBG: No results for input(s): GLUCAP in the last 168 hours. Lipid Profile: No results for input(s): CHOL, HDL, LDLCALC, TRIG, CHOLHDL, LDLDIRECT in the last 72 hours. Thyroid Function Tests: Recent Labs    07/18/20 0456  TSH 3.044   Anemia Panel: No results for input(s): VITAMINB12, FOLATE, FERRITIN, TIBC, IRON, RETICCTPCT in the last 72 hours. Sepsis Labs: Recent Labs  Lab 07/17/20 1952 07/18/20 0456  PROCALCITON <0.10 <0.10    Recent Results (from the past 240 hour(s))  Resp Panel by RT-PCR (Flu A&B, Covid) Nasopharyngeal Swab     Status: None   Collection Time: 07/17/20  6:26 PM   Specimen: Nasopharyngeal Swab; Nasopharyngeal(NP) swabs in vial transport medium  Result Value Ref  Range Status   SARS Coronavirus 2 by RT PCR NEGATIVE NEGATIVE Final    Comment: (NOTE) SARS-CoV-2 target nucleic acids are NOT DETECTED.  The SARS-CoV-2 RNA is generally detectable in upper respiratory specimens during the acute phase of infection. The lowest concentration of SARS-CoV-2 viral copies this assay can detect is 138 copies/mL. A negative result does not preclude SARS-Cov-2 infection and should not be used as the sole basis for treatment or other patient management decisions. A negative result may occur with  improper specimen collection/handling, submission of specimen other than nasopharyngeal swab, presence of viral mutation(s) within the areas targeted by this assay, and inadequate number of viral copies(<138 copies/mL). A negative result must be combined with clinical observations, patient history, and epidemiological information. The expected result is Negative.  Fact Sheet for Patients:  BloggerCourse.com  Fact Sheet for Healthcare Providers:  SeriousBroker.it  This test is no t yet approved or cleared by the [place] FDA and  has been authorized for detection and/or diagnosis of SARS-CoV-2 by FDA under an Emergency Use Authorization (EUA). This EUA will remain  in effect (meaning this test can be used) for the duration of the COVID-19 declaration under Section 564(b)(1) of the Act, 21 U.S.C.section 360bbb-3(b)(1), unless the authorization is terminated  or revoked sooner.       Influenza A by PCR NEGATIVE NEGATIVE Final   Influenza B by PCR NEGATIVE NEGATIVE Final    Comment: (NOTE) The Xpert Xpress SARS-CoV-2/FLU/RSV plus assay is intended as an aid in the diagnosis of influenza from Nasopharyngeal swab specimens and should not be used as a sole basis for treatment. Nasal washings and aspirates are unacceptable for Xpert Xpress SARS-CoV-2/FLU/RSV testing.  Fact Sheet for  Patients: BloggerCourse.com  Fact Sheet for Healthcare Providers: SeriousBroker.it  This test is not yet approved or cleared by the [place] FDA and has been  authorized for detection and/or diagnosis of SARS-CoV-2 by FDA under an Emergency Use Authorization (EUA). This EUA will remain in effect (meaning this test can be used) for the duration of the COVID-19 declaration under Section 564(b)(1) of the Act, 21 U.S.C. section 360bbb-3(b)(1), unless the authorization is terminated or revoked.  Performed at [facility], [address] [place], [place] [address]   Blood culture (routine x 2)     Status: None (Preliminary result)   Collection Time: 07/17/20  7:52 PM   Specimen: BLOOD  Result Value Ref Range Status   Specimen Description BLOOD RIGHT ANTECUBITAL  Final   Special Requests   Final    BOTTLES DRAWN AEROBIC AND ANAEROBIC Blood Culture results may not be optimal due to an excessive volume of blood received in culture bottles   Culture   Final    NO GROWTH < 12 HOURS Performed at [facility], [address] [place], [place] [address]    Report Status PENDING  Incomplete  Blood culture (routine x 2)     Status: None (Preliminary result)   Collection Time: 07/17/20  7:52 PM   Specimen: BLOOD  Result Value Ref Range Status   Specimen Description BLOOD BLOOD LEFT FOREARM  Final   Special Requests   Final    BOTTLES DRAWN AEROBIC AND ANAEROBIC Blood Culture adequate volume   Culture   Final    NO GROWTH < 12 HOURS Performed at [facility], [address] [place], [place] [address]    Report Status PENDING  Incomplete      Radiology Studies: DG Chest 2 View  Result Date: 07/17/2020 CLINICAL DATA:  Shortness of breath. Blood pressure assisted fluctuating. Positive rhonchi. History of hypertension. EXAM: CHEST - 2 VIEW COMPARISON:  Chest x-ray 01/17/2019, CT chest 04/07/2019 FINDINGS:  The heart size and mediastinal contours are within normal limits. Aortic arch calcifications. Biapical pleural/pulmonary scarring. Coarsened interstitial markings. Flattening of bilateral hemidiaphragms. Left lower lobe and right middle lobe airspace opacities. Calcified density overlying the right proximal clavicle correlates with vascular calcification noted on CT chest. No focal consolidation. No pulmonary edema. Blunting of bilateral costophrenic angles with possible trace pleural effusions. No pneumothorax. No acute osseous abnormality.  Diffusely decreased bone density. IMPRESSION: Left lower lobe and right middle lobe airspace opacities could represent pneumonia versus inflammation (such as aspiration pneumonia). Possible trace pleural effusions. Followup PA and lateral chest X-ray is recommended in 3-4 weeks following trial therapy to ensure resolution and exclude underlying malignancy. Electronically Signed   By: Tish Frederickson M.D.   On: 07/17/2020 17:55    Scheduled Meds: . aspirin EC  81 mg Oral Daily  . atorvastatin  40 mg Oral q1800  . Fluticasone-Umeclidin-Vilant  1 puff Inhalation Daily  . guaiFENesin  600 mg Oral Daily  . pantoprazole  40 mg Oral Daily  . sodium chloride flush  3 mL Intravenous Q12H  . tamsulosin  0.4 mg Oral Daily   Continuous Infusions: . sodium chloride    . azithromycin    . cefTRIAXone (ROCEPHIN)  IV       LOS: 1 day   Time spent: 35 minutes   Hughie Closs, MD [facility]  07/18/2020, 8:25 AM   To contact the attending provider between 7A-7P or the covering provider during after hours 7P-7A, please log into the web site [email or web address].

## 2020-07-18 NOTE — ED Notes (Signed)
Pt transported to 2A at this time by EDT Caitlyn.

## 2020-07-18 NOTE — ED Notes (Signed)
Meal tray given at this time 

## 2020-07-18 NOTE — ED Notes (Addendum)
RN Sam advised this RN that she felt pt had increased rattle present.  Will message MD regarding this and update him.  Pt repositioned in bed and given extra pillow for comfort.

## 2020-07-18 NOTE — Progress Notes (Signed)
*  PRELIMINARY RESULTS* Echocardiogram 2D Echocardiogram has been performed.  Martin Bean 07/18/2020, 9:14 AM

## 2020-07-18 NOTE — ED Notes (Signed)
ECHO at bedside.

## 2020-07-18 NOTE — ED Notes (Signed)
Attempted to call floor for pt. Placed on hold. Will call back again.

## 2020-07-18 NOTE — ED Notes (Signed)
MD updated on pt status at this time.

## 2020-07-18 NOTE — ED Notes (Addendum)
SLP at bedside with daughter. Pt instructed what medications he is being given. Pt able to take all medications with applesauce at this time. Pt able to take small sips of water. No straw per SLP.

## 2020-07-18 NOTE — ED Notes (Signed)
Pharmacy messaged regarding missing dose of inhaler at this time

## 2020-07-18 NOTE — ED Notes (Signed)
Echo at bedside

## 2020-07-18 NOTE — ED Notes (Signed)
Date and time results received: 07/18/20 "9:16 AM" (use smartphrase ".now" to insert current time)  Test: Sodium Critical Value: 119  Name of Provider Notified: Opyd MD   Orders Received? Or Actions Taken?: Orders Received - See Orders for details

## 2020-07-18 NOTE — ED Notes (Signed)
SLP at bedside at this time.

## 2020-07-19 DIAGNOSIS — I5033 Acute on chronic diastolic (congestive) heart failure: Secondary | ICD-10-CM | POA: Diagnosis not present

## 2020-07-19 LAB — CBC WITH DIFFERENTIAL/PLATELET
Abs Immature Granulocytes: 0.15 10*3/uL — ABNORMAL HIGH (ref 0.00–0.07)
Basophils Absolute: 0 10*3/uL (ref 0.0–0.1)
Basophils Relative: 0 %
Eosinophils Absolute: 0 10*3/uL (ref 0.0–0.5)
Eosinophils Relative: 0 %
HCT: 28.1 % — ABNORMAL LOW (ref 39.0–52.0)
Hemoglobin: 10.1 g/dL — ABNORMAL LOW (ref 13.0–17.0)
Immature Granulocytes: 1 %
Lymphocytes Relative: 3 %
Lymphs Abs: 0.6 10*3/uL — ABNORMAL LOW (ref 0.7–4.0)
MCH: 30.7 pg (ref 26.0–34.0)
MCHC: 35.9 g/dL (ref 30.0–36.0)
MCV: 85.4 fL (ref 80.0–100.0)
Monocytes Absolute: 1.2 10*3/uL — ABNORMAL HIGH (ref 0.1–1.0)
Monocytes Relative: 6 %
Neutro Abs: 17.6 10*3/uL — ABNORMAL HIGH (ref 1.7–7.7)
Neutrophils Relative %: 90 %
Platelets: 259 10*3/uL (ref 150–400)
RBC: 3.29 MIL/uL — ABNORMAL LOW (ref 4.22–5.81)
RDW: 13.1 % (ref 11.5–15.5)
WBC: 19.7 10*3/uL — ABNORMAL HIGH (ref 4.0–10.5)
nRBC: 0 % (ref 0.0–0.2)

## 2020-07-19 LAB — BASIC METABOLIC PANEL
Anion gap: 10 (ref 5–15)
Anion gap: 12 (ref 5–15)
Anion gap: 10 (ref 5–15)
BUN: 15 mg/dL (ref 8–23)
BUN: 15 mg/dL (ref 8–23)
BUN: 16 mg/dL (ref 8–23)
CO2: 22 mmol/L (ref 22–32)
CO2: 23 mmol/L (ref 22–32)
CO2: 24 mmol/L (ref 22–32)
Calcium: 8.2 mg/dL — ABNORMAL LOW (ref 8.9–10.3)
Calcium: 8.9 mg/dL (ref 8.9–10.3)
Calcium: 8.2 mg/dL — ABNORMAL LOW (ref 8.9–10.3)
Chloride: 89 mmol/L — ABNORMAL LOW (ref 98–111)
Chloride: 85 mmol/L — ABNORMAL LOW (ref 98–111)
Chloride: 86 mmol/L — ABNORMAL LOW (ref 98–111)
Creatinine, Ser: 0.73 mg/dL (ref 0.61–1.24)
Creatinine, Ser: 0.75 mg/dL (ref 0.61–1.24)
Creatinine, Ser: 0.66 mg/dL (ref 0.61–1.24)
GFR, Estimated: >60 mL/min (ref >60)
GFR, Estimated: >60 mL/min (ref >60)
GFR, Estimated: >60 mL/min (ref >60)
Glucose, Bld: 140 mg/dL — ABNORMAL HIGH (ref 70–99)
Glucose, Bld: 169 mg/dL — ABNORMAL HIGH (ref 70–99)
Glucose, Bld: 138 mg/dL — ABNORMAL HIGH (ref 70–99)
Potassium: 3.8 mmol/L (ref 3.5–5.1)
Potassium: 4 mmol/L (ref 3.5–5.1)
Potassium: 3.9 mmol/L (ref 3.5–5.1)
Sodium: 121 mmol/L — ABNORMAL LOW (ref 135–145)
Sodium: 120 mmol/L — ABNORMAL LOW (ref 135–145)
Sodium: 120 mmol/L — ABNORMAL LOW (ref 135–145)

## 2020-07-19 LAB — SODIUM
Sodium: 120 mmol/L — ABNORMAL LOW (ref 135–145)
Sodium: 120 mmol/L — ABNORMAL LOW (ref 135–145)

## 2020-07-19 MED ORDER — SODIUM BICARBONATE 650 MG PO TABS
650.0000 mg | ORAL_TABLET | Freq: Two times a day (BID) | ORAL | Status: DC
  Administered 2020-07-19 (×2): 650 mg via ORAL
  Filled 2020-07-19 (×2): qty 1

## 2020-07-19 NOTE — Evaluation (Signed)
Physical Therapy Evaluation Patient Details Name: Martin Bean MRN: [id number] DOB: May 30, 1930 Today's Date: 07/19/2020   History of Present Illness  86 y.o. male with medical history significant for history of CVA, hypertension, chronic diastolic CHF, and depression, now presenting to the emergency department for evaluation of shortness of breath, cough, nausea, and general malaise.  Patient is accompanied by his daughter who assists with the history.  Patient has seemed to have some general weakness and fatigue for the past 1 to 2 weeks per report of his daughter.  Patient reports that he has developed some nausea, shortness of breath, and productive cough over the past couple days.  He denies any fevers, chills, chest pain, or palpitations.  He has slept in a recliner for years and is unable to comment on orthopnea.  He reports some bilateral lower leg swelling that he describes as chronic.  He had not eaten much today, but denies any vomiting, diarrhea, or loss of appetite until today.  Patient denies any headache and his daughter has not noticed any confusion. He denies difficulty swallowing or choking or coughing when trying to eat or drink.     Clinical Impression  Pt received in Semi-Fowler's position and agreeable to therapy.  Son in room during therapy session as well.  Pt is very HOH however responds well to commands.  Pt notes that his legs have been sore today and that he needs assistance when trying to come into standing position.  Pt first performed bed level exercises and was educated on benefits of performing throughout the day in order to remain active.  Pt does still have rattle in chest when performing any physical activity, however SpO2 stats remained above 92% throughout session until we attempted to stand in which they stayed above 89%.  Pt was able to perform bed-level exercises without much difficulty.  Pt then progressed to transfer to sitting EOB.  PT noted slight dizziness  and allowed for the feeling to subside before attempting to stand.  Pt then stated he needed assistance from therapist in order to come into standing.  PT utilized minA and elevated bed, however pt was able to perform 90% of work.  Pt then stood for several minutes, performing marching in place and side steps at EOB.  Pt bed changed by nursing prior to pt returning to bed.  Pt then proceeded to perform more bed-level exercises and noted that his leg soreness was gone after standing.  Pt asked for therapy to come more than 2x week and was told that we would accommodate if possible.  Call bell remote was not in room and another was found and installed to the bed and tested prior to leaving the room.  All needs met and questions answered prior to ending sessions.  D/c options were presented to both pt and son and both were in agreement with SNF at this time, with hopes that he may be able to return to his assisted living facility.  Pt will benefit from skilled PT intervention to increase independence and safety with basic mobility in preparation for discharge to the venue listed below.        Follow Up Recommendations SNF;Supervision/Assistance - 24 hour    Equipment Recommendations  Rolling walker with 5" wheels    Recommendations for Other Services OT consult     Precautions / Restrictions Precautions Precautions: Fall Restrictions Weight Bearing Restrictions: No      Mobility  Bed Mobility Overal bed mobility: Needs Assistance Bed  Mobility: Supine to Sit;Sit to Supine     Supine to sit: Min assist Sit to supine: Min assist   General bed mobility comments: for trunk support to EOB with min cuing for technique and use of bed rails    Transfers Overall transfer level: Needs assistance Equipment used: 1 person hand held assist Transfers: Sit to/from Stand Sit to Stand: Min assist         General transfer comment: min lifting assistance and balance assist needed for safety with  transfer  Ambulation/Gait Ambulation/Gait assistance: Min assist Gait Distance (Feet): 3 Feet Assistive device: Rolling walker (2 wheeled)   Gait velocity: decreased   General Gait Details: side stepping at EOB.  Stairs            Wheelchair Mobility    Modified Rankin (Stroke Patients Only)       Balance Overall balance assessment: Needs assistance Sitting-balance support: Feet supported Sitting balance-Leahy Scale: Fair     Standing balance support: During functional activity Standing balance-Leahy Scale: Poor                               Pertinent Vitals/Pain Pain Assessment: No/denies pain    Home Living Family/patient expects to be discharged to:: Assisted living ([facility])             Home Layout: One level Home Equipment: Cane - single point;Walker - 2 wheels;Shower seat      Prior Function Level of Independence: Independent with assistive device(s)         Comments: mod I with use of RW for functional transfers/mobility. Use of shower chair for bathing. He goes to community room for meals and he manages his own medications. Family assists with laundry.     Hand Dominance   Dominant Hand: Right    Extremity/Trunk Assessment   Upper Extremity Assessment Upper Extremity Assessment: Generalized weakness    Lower Extremity Assessment Lower Extremity Assessment: Generalized weakness       Communication   Communication: No difficulties  Cognition Arousal/Alertness: Awake/alert Behavior During Therapy: WFL for tasks assessed/performed Overall Cognitive Status: Within Functional Limits for tasks assessed                                        General Comments      Exercises Total Joint Exercises Ankle Circles/Pumps: AROM;Strengthening;Both;10 reps;Supine Quad Sets: AROM;Strengthening;Both;10 reps;Supine Gluteal Sets: AROM;Strengthening;Both;10 reps;Supine Heel Slides: AROM;Strengthening;Both;10  reps;Supine Hip ABduction/ADduction: AROM;Strengthening;Both;10 reps;Supine Straight Leg Raises: AROM;Strengthening;Both;20 reps;Supine Marching in Standing: AROM;Strengthening;Both;10 reps;Standing Other Exercises Other Exercises: Pt and son educated on role of PT and services provided during hospital stay. Other Exercises: Pt educated on physiological benefits of exercises and staying active throughout the day whne therapy is not in session.   Assessment/Plan    PT Assessment Patient needs continued PT services  PT Problem List Decreased strength;Decreased range of motion;Decreased activity tolerance;Decreased balance;Decreased mobility;Decreased knowledge of use of DME;Decreased safety awareness       PT Treatment Interventions DME instruction;Gait training;Stair training;Functional mobility training;Therapeutic activities;Therapeutic exercise;Balance training;Neuromuscular re-education    PT Goals (Current goals can be found in the Care Plan section)  Acute Rehab PT Goals Patient Stated Goal: to get better PT Goal Formulation: With patient/family Time For Goal Achievement: 08/02/20 Potential to Achieve Goals: Good    Frequency Min 3X/week   Barriers  to discharge        Co-evaluation               AM-PAC PT "6 Clicks" Mobility  Outcome Measure Help needed turning from your back to your side while in a flat bed without using bedrails?: A Little Help needed moving from lying on your back to sitting on the side of a flat bed without using bedrails?: A Little Help needed moving to and from a bed to a chair (including a wheelchair)?: A Little Help needed standing up from a chair using your arms (e.g., wheelchair or bedside chair)?: A Lot Help needed to walk in hospital room?: A Lot Help needed climbing 3-5 steps with a railing? : A Lot 6 Click Score: 15    End of Session Equipment Utilized During Treatment: Gait belt Activity Tolerance: Patient tolerated treatment  well;Patient limited by fatigue Patient left: in bed;with call bell/phone within reach;with bed alarm set Nurse Communication: Mobility status PT Visit Diagnosis: Unsteadiness on feet (R26.81);Other abnormalities of gait and mobility (R26.89);Muscle weakness (generalized) (M62.81);History of falling (Z91.81);Difficulty in walking, not elsewhere classified (R26.2)    Time: 5809-9833 PT Time Calculation (min) (ACUTE ONLY): 55 min   Charges:   PT Evaluation $PT Eval Low Complexity: 1 Low PT Treatments $Gait Training: 8-22 mins $Therapeutic Exercise: 23-37 mins $Therapeutic Activity: 8-22 mins        Gwenlyn Saran, PT, DPT 07/19/20, 6:01 PM

## 2020-07-19 NOTE — Plan of Care (Signed)
No acute events overnight. Pt VSS. Will continue to monitor. Problem: Education: Goal: Ability to demonstrate management of disease process will improve Outcome: Progressing Goal: Ability to verbalize understanding of medication therapies will improve Outcome: Progressing Goal: Individualized Educational Video(s) Outcome: Progressing   Problem: Activity: Goal: Capacity to carry out activities will improve Outcome: Progressing   Problem: Cardiac: Goal: Ability to achieve and maintain adequate cardiopulmonary perfusion will improve Outcome: Progressing

## 2020-07-19 NOTE — Evaluation (Signed)
Occupational Therapy Evaluation Patient Details Name: Martin Bean MRN: [id number] DOB: Oct 18, 1929 Today's Date: 07/19/2020    History of Present Illness 86 y.o. male with medical history significant for history of CVA, hypertension, chronic diastolic CHF, and depression, now presenting to the emergency department for evaluation of shortness of breath, cough, nausea, and general malaise.  Patient is accompanied by his daughter who assists with the history.  Patient has seemed to have some general weakness and fatigue for the past 1 to 2 weeks per report of his daughter.  Patient reports that he has developed some nausea, shortness of breath, and productive cough over the past couple days.  He denies any fevers, chills, chest pain, or palpitations.  He has slept in a recliner for years and is unable to comment on orthopnea.  He reports some bilateral lower leg swelling that he describes as chronic.  He had not eaten much today, but denies any vomiting, diarrhea, or loss of appetite until today.  Patient denies any headache and his daughter has not noticed any confusion. He denies difficulty swallowing or choking or coughing when trying to eat or drink.   Clinical Impression   Patient presenting with decreased I in self care, balance, functional mobility/transfers, endurance, and safety awareness.  Patient reports living alone at [facility]. He utilizes RW for self care tasks and is mod I with all aspects of ADLs. He goes to community area for meals and he manages his own medications. Pt on 2 L O2 via [place] and RN requesting therapist to attempt to wean pt. OT removes O2 and pt saturation dropping to 86% on RA and pt unable to recover on his own. Pt put on 1 L and about to remain at or above 93%. Pt noted to have BM on bed and transferred with mod A to [facility] Pt unable to have further BM and needing max A for hygiene and clothing management while he stood. Pt returning back to bed with mod A and  noted to have rattles with breathing. RN notified. Pt with unproductive cough. Patient will benefit from acute OT to increase overall independence in the areas of ADLs, functional mobility, and safety awareness in order to safely discharge to next venue of care.    Follow Up Recommendations  SNF;Supervision/Assistance - 24 hour    Equipment Recommendations  Other (comment) (defer to next venue of care)       Precautions / Restrictions Precautions Precautions: Fall Restrictions Weight Bearing Restrictions: No      Mobility Bed Mobility Overal bed mobility: Needs Assistance Bed Mobility: Supine to Sit;Sit to Supine     Supine to sit: Min assist Sit to supine: Min assist   General bed mobility comments: for trunk support to EOB with min cuing for technique and use of bed rails    Transfers Overall transfer level: Needs assistance Equipment used: 1 person hand held assist Transfers: Stand Pivot Transfers   Stand pivot transfers: Mod assist       General transfer comment: mod lifting assistance and balance assist needed for safety with transfer    Balance Overall balance assessment: Needs assistance Sitting-balance support: Feet supported Sitting balance-Leahy Scale: Fair     Standing balance support: During functional activity Standing balance-Leahy Scale: Poor          ADL either performed or assessed with clinical judgement   ADL Overall ADL's : Needs assistance/impaired       Toilet Transfer: Moderate assistance;BSC;Ambulation  Toileting- Clothing Manipulation and Hygiene: Maximal assistance         General ADL Comments: Pt needing mod A for transfer from bed > BSC. Pt needing assistance with hygiene and clothing management. Very weak with mobility and self care tasks and fatigues quickly.     Vision Baseline Vision/History: No visual deficits Patient Visual Report: No change from baseline              Pertinent Vitals/Pain Pain Assessment:  No/denies pain     Hand Dominance Right   Extremity/Trunk Assessment Upper Extremity Assessment Upper Extremity Assessment: Generalized weakness   Lower Extremity Assessment Lower Extremity Assessment: Generalized weakness       Communication Communication Communication: No difficulties   Cognition Arousal/Alertness: Awake/alert Behavior During Therapy: WFL for tasks assessed/performed Overall Cognitive Status: Within Functional Limits for tasks assessed                  Home Living Family/patient expects to be discharged to:: Assisted living ([facility])      Home Layout: One level     Bathroom Shower/Tub: Producer, television/film/video: Handicapped height Bathroom Accessibility: Yes   Home Equipment: Cane - single point;Walker - 2 wheels;Shower seat          Prior Functioning/Environment Level of Independence: Independent with assistive device(s)        Comments: mod I with use of RW for functional transfers/mobility. Use of shower chair for bathing. He goes to community room for meals and he manages his own medications. Family assists with laundry.        OT Problem List: Decreased strength;Decreased activity tolerance;Decreased safety awareness;Impaired balance (sitting and/or standing);Decreased knowledge of use of DME or AE;Decreased knowledge of precautions      OT Treatment/Interventions: Self-care/ADL training;Therapeutic exercise;Therapeutic activities;DME and/or AE instruction;Patient/family education;Balance training;Energy conservation;Manual therapy    OT Goals(Current goals can be found in the care plan section) Acute Rehab OT Goals Patient Stated Goal: to get better OT Goal Formulation: With patient Time For Goal Achievement: 08/02/20 Potential to Achieve Goals: Good ADL Goals Pt Will Perform Grooming: (P) with supervision;standing Pt Will Transfer to Toilet: (P) with supervision;ambulating Pt Will Perform Toileting - Clothing  Manipulation and hygiene: (P) with supervision;sit to/from stand  OT Frequency: Min 1X/week   Barriers to D/C: Other (comment)  none known at this time          AM-PAC OT "6 Clicks" Daily Activity     Outcome Measure Help from another person eating meals?: None Help from another person taking care of personal grooming?: A Little Help from another person toileting, which includes using toliet, bedpan, or urinal?: A Lot Help from another person bathing (including washing, rinsing, drying)?: A Lot Help from another person to put on and taking off regular upper body clothing?: A Little Help from another person to put on and taking off regular lower body clothing?: A Lot 6 Click Score: 16   End of Session Equipment Utilized During Treatment: Other (comment);Oxygen (BSC) Nurse Communication: Mobility status;Other (comment) (oxygen needs and rattle sounds with breathing)  Activity Tolerance: Patient limited by fatigue Patient left: in bed;with call bell/phone within reach;with bed alarm set  OT Visit Diagnosis: Unsteadiness on feet (R26.81);Muscle weakness (generalized) (M62.81)                Time: 2703-5009 OT Time Calculation (min): 30 min Charges:  OT General Charges $OT Visit: 1 Visit OT Evaluation $OT Eval Low Complexity: 1 Low  OT Treatments $Self Care/Home Management : 23-37 mins  Jackquline Denmark, MS, OTR/L , CBIS ascom [phone]  07/19/20, 10:57 AM

## 2020-07-19 NOTE — Progress Notes (Signed)
PT Cancellation Note  Patient Details Name: DOLPH TAVANO MRN: [id number] DOB: 07-18-1930   Cancelled Treatment:    Reason Eval/Treat Not Completed: Other (comment).  Pt sleeping upon arrival to room and nursing stated that he was weak during OT session.  Will attempt evaluation if still appropriate.     Nolon Bussing, PT, DPT 07/19/20, 10:24 AM

## 2020-07-19 NOTE — Progress Notes (Addendum)
PROGRESS NOTE    Martin Bean  ZOX:[id number] DOB: June 19, 1930 DOA: 07/17/2020 PCP: Gracelyn Nurse, MD   Brief Narrative:  HPI: Martin Bean is a 86 y.o. male with medical history significant for history of CVA, hypertension, chronic diastolic CHF, and depression, now presenting to the emergency department for evaluation of shortness of breath, cough, nausea, and general malaise.  Patient is accompanied by his daughter who assists with the history.  Patient has seemed to have some general weakness and fatigue for the past 1 to 2 weeks per report of his daughter.  Patient reports that he has developed some nausea, shortness of breath, and productive cough over the past couple days.  He denies any fevers, chills, chest pain, or palpitations.  He has slept in a recliner for years and is unable to comment on orthopnea.  He reports some bilateral lower leg swelling that he describes as chronic.  He had not eaten much today, but denies any vomiting, diarrhea, or loss of appetite until today.  Patient denies any headache and his daughter has not noticed any confusion. He denies difficulty swallowing or choking or coughing when trying to eat or drink.   ED Course: Upon arrival to the ED, patient is found to be afebrile, saturating 94% on 2 L/min of supplemental oxygen, tachypneic in the upper 20s, and with blood pressure as high as 207/92.  EKG features sinus rhythm with PVCs.  Chest x-ray demonstrates left lower lobe and right middle lobe opacities concerning for pneumonia.  Chemistry panel features a sodium of 122.  CBC with slight normocytic anemia.  High-sensitivity troponin is normal and BNP is elevated to 202.  COVID-19 PCR is negative.  Patient was given 40 mg IV Lasix, Rocephin, and azithromycin in the ED.  Assessment & Plan:   Principal Problem:   Acute on chronic diastolic CHF (congestive heart failure) (HCC) Active Problems:   Depression, prolonged   CAP (community acquired pneumonia)    Hyponatremia   Hypertensive urgency   1.  Acute hypoxic respiratory failure secondary to CAP vs aspiration pneumonia: Does not use any oxygen at home.  Procalcitonin unremarkable.  Currently on 2 L.  Seen by SLP.  They advanced him to dysphagia 2 diet.  He remains afebrile but for some reason, he now has leukocytosis.He is negative for Covid, influenza and urinary antigen for streptococci.  Antigen for Legionella is pending.  Continue Rocephin and Zithromax.  2. Hyponatremia due to SIADH: Presented with sodium of 122 which initially dropped and then now has started to improve.  Continue fluid restriction and I will start him on sodium bicarb tablets as well.  Monitor every 6 hours.  3. Hypertensive urgency: Patient presented with BP as high as 207/92 in ED.  This has improved.  Interestingly, patient is not on any antihypertensives and instead he is on midodrine.  Will watch closely for now.  Labetalol as needed.  9.  4. Acute on chronic diastolic CHF: Patient was presumed to have acute on chronic diastolic CHF.  He received 1 dose of Lasix.  He does not seem to have any acute exacerbation anymore.  He is not taking any Lasix at home.  He looks euvolemic.  Monitor daily.  Echo with normal ejection fraction, no diastolic dysfunction.  5. Depression  - Hold Zoloft in light of hyponatremia    6.  Hyperlipidemia: Continue atorvastatin.  7.  BPH: Continue Flomax.  8.  Generalized weakness: Patient looks significantly weak.  This could  very well be due to infection and hyponatremia but I wonder if he is going to require discharge to SNF.  I have consulted PT OT and have discussed this with the daughter as well.  She tells me that patient usually does not like to go to SNF and she was wondering if there will be an option for home health.  DVT prophylaxis: enoxaparin (LOVENOX) injection 40 mg Start: 07/18/20 1615   Code Status: DNR  Family Communication: None present at bedside.  Discussed in  person with daughter yesterday and over the phone today.  Status is: Inpatient  Remains inpatient appropriate because:Inpatient level of care appropriate due to severity of illness   Dispo: The patient is from: Home              Anticipated d/c is to: SNF vs home with home health              Anticipated d/c date is: 1 to 2 days              Patient currently is not medically stable to d/c.        Estimated body mass index is 20.93 kg/m as calculated from the following:   Height as of this encounter: 5\' 9"  (1.753 m).   Weight as of this encounter: 64.3 kg.      Nutritional status:               Consultants:   None  Procedures:   None  Antimicrobials:  Anti-infectives (From admission, onward)   Start     Dose/Rate Route Frequency Ordered Stop   07/18/20 2000  cefTRIAXone (ROCEPHIN) 2 g in sodium chloride 0.9 % 100 mL IVPB        2 g 200 mL/hr over 30 Minutes Intravenous Every 24 hours 07/17/20 1958 07/22/20 1959   07/18/20 2000  azithromycin (ZITHROMAX) 500 mg in sodium chloride 0.9 % 250 mL IVPB        500 mg 250 mL/hr over 60 Minutes Intravenous Every 24 hours 07/17/20 1958 07/22/20 1959   07/17/20 1900  cefTRIAXone (ROCEPHIN) 1 g in sodium chloride 0.9 % 100 mL IVPB        1 g 200 mL/hr over 30 Minutes Intravenous  Once 07/17/20 1858 07/17/20 2030   07/17/20 1900  azithromycin (ZITHROMAX) 500 mg in sodium chloride 0.9 % 250 mL IVPB        500 mg 250 mL/hr over 60 Minutes Intravenous  Once 07/17/20 1858 07/17/20 2058         Subjective: Seen and examined.  Patient definitely is brighter and more awake today.  Denies any complaint.  Still on 2 L.  I have placed orders to wean oxygen.  Objective: Vitals:   07/18/20 1441 07/18/20 2139 07/19/20 0359 07/19/20 0844  BP: (!) 143/67 (!) 102/53 (!) 143/65 (!) 165/81  Pulse: 71 86 84 88  Resp: 20 (!) 22 (!) 24 18  Temp: 98.4 F (36.9 C) 98.2 F (36.8 C) 98 F (36.7 C) 98 F (36.7 C)  TempSrc:  Oral Oral Oral Oral  SpO2: 93% 95% 96% 94%  Weight: 67.4 kg  64.3 kg   Height: 5\' 9"  (1.753 m)       Intake/Output Summary (Last 24 hours) at 07/19/2020 0922 Last data filed at 07/19/2020 0405 Gross per 24 hour  Intake 855.45 ml  Output 450 ml  Net 405.45 ml   Filed Weights   07/18/20 0739 07/18/20 1441 07/19/20 0359  Weight: 69.4 kg 67.4 kg 64.3 kg    Examination:  General exam: Appears calm and comfortable  Respiratory system: Rhonchi bilaterally. Respiratory effort normal. Cardiovascular system: S1 & S2 heard, RRR. No JVD, murmurs, rubs, gallops or clicks. No pedal edema. Gastrointestinal system: Abdomen is nondistended, soft and nontender. No organomegaly or masses felt. Normal bowel sounds heard. Central nervous system: Alert and oriented. No focal neurological deficits. Extremities: Symmetric 5 x 5 power. Skin: No rashes, lesions or ulcers.  Psychiatry: Judgement and insight appear poor. Mood & affect flat    Data Reviewed: I have personally reviewed following labs and imaging studies  CBC: Recent Labs  Lab 07/17/20 1717 07/19/20 0607  WBC 8.8 19.7*  NEUTROABS  --  17.6*  HGB 11.0* 10.1*  HCT 31.7* 28.1*  MCV 87.8 85.4  PLT 255 259   Basic Metabolic Panel: Recent Labs  Lab 07/17/20 1717 07/18/20 0006 07/18/20 0858 07/18/20 1708 07/19/20 0037  NA 122* 121* 119* 119* 121*  K 4.5 4.1 3.9 4.0 3.8  CL 90* 90* 87* 86* 89*  CO2 23 23 23 22 22   GLUCOSE 131* 137* 128* 155* 140*  BUN 13 13 13 14 15   CREATININE 0.64 0.65 0.76 0.71 0.73  CALCIUM 8.6* 8.3* 8.6* 8.5* 8.2*   GFR: Estimated Creatinine Clearance: 59.2 mL/min (by C-G formula based on SCr of 0.73 mg/dL). Liver Function Tests: No results for input(s): AST, ALT, ALKPHOS, BILITOT, PROT, ALBUMIN in the last 168 hours. No results for input(s): LIPASE, AMYLASE in the last 168 hours. No results for input(s): AMMONIA in the last 168 hours. Coagulation Profile: No results for input(s): INR, PROTIME in  the last 168 hours. Cardiac Enzymes: No results for input(s): CKTOTAL, CKMB, CKMBINDEX, TROPONINI in the last 168 hours. BNP (last 3 results) No results for input(s): PROBNP in the last 8760 hours. HbA1C: No results for input(s): HGBA1C in the last 72 hours. CBG: No results for input(s): GLUCAP in the last 168 hours. Lipid Profile: No results for input(s): CHOL, HDL, LDLCALC, TRIG, CHOLHDL, LDLDIRECT in the last 72 hours. Thyroid Function Tests: Recent Labs    07/18/20 0456  TSH 3.044   Anemia Panel: No results for input(s): VITAMINB12, FOLATE, FERRITIN, TIBC, IRON, RETICCTPCT in the last 72 hours. Sepsis Labs: Recent Labs  Lab 07/17/20 1952 07/18/20 0456  PROCALCITON <0.10 <0.10    Recent Results (from the past 240 hour(s))  Resp Panel by RT-PCR (Flu A&B, Covid) Nasopharyngeal Swab     Status: None   Collection Time: 07/17/20  6:26 PM   Specimen: Nasopharyngeal Swab; Nasopharyngeal(NP) swabs in vial transport medium  Result Value Ref Range Status   SARS Coronavirus 2 by RT PCR NEGATIVE NEGATIVE Final    Comment: (NOTE) SARS-CoV-2 target nucleic acids are NOT DETECTED.  The SARS-CoV-2 RNA is generally detectable in upper respiratory specimens during the acute phase of infection. The lowest concentration of SARS-CoV-2 viral copies this assay can detect is 138 copies/mL. A negative result does not preclude SARS-Cov-2 infection and should not be used as the sole basis for treatment or other patient management decisions. A negative result may occur with  improper specimen collection/handling, submission of specimen other than nasopharyngeal swab, presence of viral mutation(s) within the areas targeted by this assay, and inadequate number of viral copies(<138 copies/mL). A negative result must be combined with clinical observations, patient history, and epidemiological information. The expected result is Negative.  Fact Sheet for Patients:    BloggerCourse.comhttps://www.fda.gov/media/152166/download  Fact Sheet for Healthcare Providers:  SeriousBroker.it  This test is no t yet approved or cleared by the [place] and  has been authorized for detection and/or diagnosis of SARS-CoV-2 by FDA under an Emergency Use Authorization (EUA). This EUA will remain  in effect (meaning this test can be used) for the duration of the COVID-19 declaration under Section 564(b)(1) of the Act, 21 U.S.C.section 360bbb-3(b)(1), unless the authorization is terminated  or revoked sooner.       Influenza A by PCR NEGATIVE NEGATIVE Final   Influenza B by PCR NEGATIVE NEGATIVE Final    Comment: (NOTE) The Xpert Xpress SARS-CoV-2/FLU/RSV plus assay is intended as an aid in the diagnosis of influenza from Nasopharyngeal swab specimens and should not be used as a sole basis for treatment. Nasal washings and aspirates are unacceptable for Xpert Xpress SARS-CoV-2/FLU/RSV testing.  Fact Sheet for Patients: BloggerCourse.com  Fact Sheet for Healthcare Providers: SeriousBroker.it  This test is not yet approved or cleared by the [place] FDA and has been authorized for detection and/or diagnosis of SARS-CoV-2 by FDA under an Emergency Use Authorization (EUA). This EUA will remain in effect (meaning this test can be used) for the duration of the COVID-19 declaration under Section 564(b)(1) of the Act, 21 U.S.C. section 360bbb-3(b)(1), unless the authorization is terminated or revoked.  Performed at [facility], [address] [place], [place] [address]   Blood culture (routine x 2)     Status: None (Preliminary result)   Collection Time: 07/17/20  7:52 PM   Specimen: BLOOD  Result Value Ref Range Status   Specimen Description BLOOD RIGHT ANTECUBITAL  Final   Special Requests   Final    BOTTLES DRAWN AEROBIC AND ANAEROBIC Blood Culture results may not be optimal  due to an excessive volume of blood received in culture bottles   Culture   Final    NO GROWTH 2 DAYS Performed at [facility], [address], [place], [place] [address]    Report Status PENDING  Incomplete  Blood culture (routine x 2)     Status: None (Preliminary result)   Collection Time: 07/17/20  7:52 PM   Specimen: BLOOD  Result Value Ref Range Status   Specimen Description BLOOD BLOOD LEFT FOREARM  Final   Special Requests   Final    BOTTLES DRAWN AEROBIC AND ANAEROBIC Blood Culture adequate volume   Culture   Final    NO GROWTH 2 DAYS Performed at [facility], [address], [place], [place] [address]    Report Status PENDING  Incomplete      Radiology Studies: DG Chest 2 View  Result Date: 07/17/2020 CLINICAL DATA:  Shortness of breath. Blood pressure assisted fluctuating. Positive rhonchi. History of hypertension. EXAM: CHEST - 2 VIEW COMPARISON:  Chest x-ray 01/17/2019, CT chest 04/07/2019 FINDINGS: The heart size and mediastinal contours are within normal limits. Aortic arch calcifications. Biapical pleural/pulmonary scarring. Coarsened interstitial markings. Flattening of bilateral hemidiaphragms. Left lower lobe and right middle lobe airspace opacities. Calcified density overlying the right proximal clavicle correlates with vascular calcification noted on CT chest. No focal consolidation. No pulmonary edema. Blunting of bilateral costophrenic angles with possible trace pleural effusions. No pneumothorax. No acute osseous abnormality.  Diffusely decreased bone density. IMPRESSION: Left lower lobe and right middle lobe airspace opacities could represent pneumonia versus inflammation (such as aspiration pneumonia). Possible trace pleural effusions. Followup PA and lateral chest X-ray is recommended in 3-4 weeks following trial therapy to ensure resolution and exclude underlying  malignancy. Electronically Signed   By: Tish Frederickson M.D.   On: 07/17/2020  17:55   DG Chest Port 1 View  Result Date: 07/18/2020 CLINICAL DATA:  Shortness of breath. EXAM: PORTABLE CHEST 1 VIEW COMPARISON:  July 17, 2020. FINDINGS: Stable cardiomediastinal silhouette. No pneumothorax is noted. Bibasilar opacities are noted concerning for subsegmental atelectasis or possibly infiltrates. Small pleural effusions are noted. Bony thorax is unremarkable. IMPRESSION: Bibasilar subsegmental atelectasis or possibly infiltrates are noted with small pleural effusions. Electronically Signed   By: Lupita Raider M.D.   On: 07/18/2020 15:51   ECHOCARDIOGRAM COMPLETE  Result Date: 07/18/2020    ECHOCARDIOGRAM REPORT   Patient Name:   DEARIES MEIKLE Date of Exam: 07/18/2020 Medical Rec #:  [id number]       Height:       69.0 in Accession #:    [id number]      Weight:       153.0 lb Date of Birth:  31-Oct-1929       BSA:          1.844 m Patient Age:    89 years        BP:           172/76 mmHg Patient Gender: M               HR:           82 bpm. Exam Location:  [facility] Procedure: 2D Echo, Cardiac Doppler and Color Doppler Indications:     CHF- acute diastolic 428.31  History:         Patient has prior history of Echocardiogram examinations, most                  recent 08/14/2017. Risk Factors:Hypertension. Diastolic CHF.  Sonographer:     Cristela Blue RDCS (AE) Referring Phys:  [id number] Lavone Neri OPYD Diagnosing Phys: Arnoldo Hooker MD IMPRESSIONS  1. Left ventricular ejection fraction, by estimation, is 60 to 65%. The left ventricle has normal function. The left ventricle has no regional wall motion abnormalities. Left ventricular diastolic parameters were normal.  2. Right ventricular systolic function is normal. The right ventricular size is normal.  3. The mitral valve is normal in structure. Trivial mitral valve regurgitation.  4. The aortic valve is normal in structure. Aortic valve regurgitation is not visualized. FINDINGS  Left Ventricle: Left ventricular ejection fraction, by  estimation, is 60 to 65%. The left ventricle has normal function. The left ventricle has no regional wall motion abnormalities. The left ventricular internal cavity size was normal in size. There is  no left ventricular hypertrophy. Left ventricular diastolic parameters were normal. Right Ventricle: The right ventricular size is normal. No increase in right ventricular wall thickness. Right ventricular systolic function is normal. Left Atrium: Left atrial size was normal in size. Right Atrium: Right atrial size was normal in size. Pericardium: There is no evidence of pericardial effusion. Mitral Valve: The mitral valve is normal in structure. Trivial mitral valve regurgitation. Tricuspid Valve: The tricuspid valve is normal in structure. Tricuspid valve regurgitation is trivial. Aortic Valve: The aortic valve is normal in structure. Aortic valve regurgitation is not visualized. Aortic valve mean gradient measures 5.3 mmHg. Aortic valve peak gradient measures 10.0 mmHg. Aortic valve area, by VTI measures 2.00 cm. Pulmonic Valve: The pulmonic valve was normal in structure. Pulmonic valve regurgitation is not visualized. Aorta: The aortic root and ascending aorta are structurally normal, with no evidence of dilitation. IAS/Shunts:  No atrial level shunt detected by color flow Doppler.  LEFT VENTRICLE PLAX 2D LVIDd:         4.14 cm  Diastology LVIDs:         2.75 cm  LV e' medial:    6.53 cm/s LV PW:         1.08 cm  LV E/e' medial:  10.8 LV IVS:        1.35 cm  LV e' lateral:   8.92 cm/s LVOT diam:     2.20 cm  LV E/e' lateral: 7.9 LV SV:         61 LV SV Index:   33 LVOT Area:     3.80 cm  RIGHT VENTRICLE RV Basal diam:  2.57 cm RV S prime:     12.90 cm/s TAPSE (M-mode): 3.1 cm LEFT ATRIUM             Index       RIGHT ATRIUM           Index LA diam:        2.70 cm 1.46 cm/m  RA Area:     17.40 cm LA Vol (A2C):   28.9 ml 15.68 ml/m RA Volume:   46.00 ml  24.95 ml/m LA Vol (A4C):   17.7 ml 9.60 ml/m LA Biplane  Vol: 22.6 ml 12.26 ml/m  AORTIC VALVE                    PULMONIC VALVE AV Area (Vmax):    1.62 cm     PV Vmax:        0.77 m/s AV Area (Vmean):   1.64 cm     PV Peak grad:   2.3 mmHg AV Area (VTI):     2.00 cm     RVOT Peak grad: 3 mmHg AV Vmax:           158.00 cm/s AV Vmean:          102.633 cm/s AV VTI:            0.305 m AV Peak Grad:      10.0 mmHg AV Mean Grad:      5.3 mmHg LVOT Vmax:         67.20 cm/s LVOT Vmean:        44.200 cm/s LVOT VTI:          0.160 m LVOT/AV VTI ratio: 0.53  AORTA Ao Root diam: 3.10 cm MITRAL VALVE                TRICUSPID VALVE MV Area (PHT): 3.99 cm     TR Peak grad:   11.6 mmHg MV Decel Time: 190 msec     TR Vmax:        170.00 cm/s MV E velocity: 70.80 cm/s MV A velocity: 127.00 cm/s  SHUNTS MV E/A ratio:  0.56         Systemic VTI:  0.16 m                             Systemic Diam: 2.20 cm Arnoldo Hooker MD Electronically signed by Arnoldo Hooker MD Signature Date/Time: 07/18/2020/12:03:22 PM    Final     Scheduled Meds: . aspirin EC  81 mg Oral Daily  . atorvastatin  40 mg Oral q1800  . enoxaparin (LOVENOX) injection  40 mg Subcutaneous Q24H  . fluticasone furoate-vilanterol  1 puff  Inhalation Daily   And  . umeclidinium bromide  1 puff Inhalation Daily  . guaiFENesin  600 mg Oral Daily  . pantoprazole  40 mg Oral Daily  . sodium bicarbonate  650 mg Oral BID  . sodium chloride flush  3 mL Intravenous Q12H  . tamsulosin  0.4 mg Oral Daily   Continuous Infusions: . sodium chloride    . azithromycin 500 mg (07/18/20 2054)  . cefTRIAXone (ROCEPHIN)  IV 2 g (07/18/20 2056)     LOS: 2 days   Time spent: 30 minutes   Hughie Closs, MD [facility]  07/19/2020, 9:22 AM   To contact the attending provider between 7A-7P or the covering provider during after hours 7P-7A, please log into the web site [email or web address].

## 2020-07-20 ENCOUNTER — Inpatient Hospital Stay: Payer: Medicare HMO

## 2020-07-20 DIAGNOSIS — J9601 Acute respiratory failure with hypoxia: Secondary | ICD-10-CM

## 2020-07-20 DIAGNOSIS — J189 Pneumonia, unspecified organism: Secondary | ICD-10-CM | POA: Diagnosis not present

## 2020-07-20 DIAGNOSIS — R531 Weakness: Secondary | ICD-10-CM

## 2020-07-20 DIAGNOSIS — J441 Chronic obstructive pulmonary disease with (acute) exacerbation: Secondary | ICD-10-CM | POA: Diagnosis not present

## 2020-07-20 DIAGNOSIS — E871 Hypo-osmolality and hyponatremia: Secondary | ICD-10-CM | POA: Diagnosis not present

## 2020-07-20 LAB — CBC WITH DIFFERENTIAL/PLATELET
Abs Immature Granulocytes: 0.09 10*3/uL — ABNORMAL HIGH (ref 0.00–0.07)
Basophils Absolute: 0 10*3/uL (ref 0.0–0.1)
Basophils Relative: 0 %
Eosinophils Absolute: 0.1 10*3/uL (ref 0.0–0.5)
Eosinophils Relative: 0 %
HCT: 27.2 % — ABNORMAL LOW (ref 39.0–52.0)
Hemoglobin: 9.6 g/dL — ABNORMAL LOW (ref 13.0–17.0)
Immature Granulocytes: 1 %
Lymphocytes Relative: 4 %
Lymphs Abs: 0.6 10*3/uL — ABNORMAL LOW (ref 0.7–4.0)
MCH: 30.4 pg (ref 26.0–34.0)
MCHC: 35.3 g/dL (ref 30.0–36.0)
MCV: 86.1 fL (ref 80.0–100.0)
Monocytes Absolute: 1.4 10*3/uL — ABNORMAL HIGH (ref 0.1–1.0)
Monocytes Relative: 8 %
Neutro Abs: 15.2 10*3/uL — ABNORMAL HIGH (ref 1.7–7.7)
Neutrophils Relative %: 87 %
Platelets: 239 10*3/uL (ref 150–400)
RBC: 3.16 MIL/uL — ABNORMAL LOW (ref 4.22–5.81)
RDW: 13 % (ref 11.5–15.5)
WBC: 17.4 10*3/uL — ABNORMAL HIGH (ref 4.0–10.5)
nRBC: 0 % (ref 0.0–0.2)

## 2020-07-20 LAB — BASIC METABOLIC PANEL
Anion gap: 9 (ref 5–15)
Anion gap: 10 (ref 5–15)
BUN: 15 mg/dL (ref 8–23)
BUN: 13 mg/dL (ref 8–23)
CO2: 25 mmol/L (ref 22–32)
CO2: 23 mmol/L (ref 22–32)
Calcium: 8.2 mg/dL — ABNORMAL LOW (ref 8.9–10.3)
Calcium: 8.4 mg/dL — ABNORMAL LOW (ref 8.9–10.3)
Chloride: 88 mmol/L — ABNORMAL LOW (ref 98–111)
Chloride: 87 mmol/L — ABNORMAL LOW (ref 98–111)
Creatinine, Ser: 0.68 mg/dL (ref 0.61–1.24)
Creatinine, Ser: 0.67 mg/dL (ref 0.61–1.24)
GFR, Estimated: >60 mL/min (ref >60)
GFR, Estimated: >60 mL/min (ref >60)
Glucose, Bld: 146 mg/dL — ABNORMAL HIGH (ref 70–99)
Glucose, Bld: 140 mg/dL — ABNORMAL HIGH (ref 70–99)
Potassium: 3.8 mmol/L (ref 3.5–5.1)
Potassium: 3.7 mmol/L (ref 3.5–5.1)
Sodium: 122 mmol/L — ABNORMAL LOW (ref 135–145)
Sodium: 120 mmol/L — ABNORMAL LOW (ref 135–145)

## 2020-07-20 LAB — LEGIONELLA PNEUMOPHILA SEROGP 1 UR AG: L. pneumophila Serogp 1 Ur Ag: NEGATIVE

## 2020-07-20 MED ORDER — AZITHROMYCIN 250 MG PO TABS
500.0000 mg | ORAL_TABLET | Freq: Every day | ORAL | Status: AC
  Administered 2020-07-20 – 2020-07-21 (×2): 500 mg via ORAL
  Filled 2020-07-20 (×2): qty 2

## 2020-07-20 MED ORDER — SODIUM CHLORIDE 1 G PO TABS
1.0000 g | ORAL_TABLET | Freq: Two times a day (BID) | ORAL | Status: DC
  Administered 2020-07-20: 1 g via ORAL
  Filled 2020-07-20 (×2): qty 1

## 2020-07-20 MED ORDER — SODIUM CHLORIDE 1 G PO TABS
1.0000 g | ORAL_TABLET | Freq: Three times a day (TID) | ORAL | Status: DC
  Administered 2020-07-20 – 2020-07-29 (×27): 1 g via ORAL
  Filled 2020-07-20 (×30): qty 1

## 2020-07-20 MED ORDER — ALBUTEROL SULFATE (2.5 MG/3ML) 0.083% IN NEBU
2.5000 mg | INHALATION_SOLUTION | Freq: Four times a day (QID) | RESPIRATORY_TRACT | Status: DC
  Administered 2020-07-20 – 2020-07-22 (×9): 2.5 mg via RESPIRATORY_TRACT
  Filled 2020-07-20 (×8): qty 3

## 2020-07-20 MED ORDER — METHYLPREDNISOLONE SODIUM SUCC 40 MG IJ SOLR
40.0000 mg | Freq: Every day | INTRAMUSCULAR | Status: DC
  Administered 2020-07-20 – 2020-07-24 (×5): 40 mg via INTRAVENOUS
  Filled 2020-07-20 (×5): qty 1

## 2020-07-20 MED ORDER — AMLODIPINE BESYLATE 5 MG PO TABS
2.5000 mg | ORAL_TABLET | Freq: Every day | ORAL | Status: DC
  Administered 2020-07-20 – 2020-07-25 (×6): 2.5 mg via ORAL
  Filled 2020-07-20 (×6): qty 1

## 2020-07-20 NOTE — Progress Notes (Signed)
Patient ID: Martin Bean, male   DOB: 01/16/1930, 86 y.o.   MRN: [id number] [facility] PROGRESS NOTE  Martin HaverJasper L Bean NWG:[id number] DOB: 10/23/1929 DOA: 07/17/2020 PCP: [name], [name], MD  HPI/Subjective: Patient complaining of shortness of breath.  He has been having shortness of breath for a while.  Also has low sodium.  He states he has a poor appetite.  Admitted initially 1128 with shortness of breath nausea and fatigue and cough.  Objective: Vitals:   07/20/20 0802 07/20/20 1127  BP: (!) 155/85 (!) 158/91  Pulse: 79 95  Resp:  19  Temp: 97.8 F (36.6 C) 98 F (36.7 C)  SpO2: 94% 95%    Intake/Output Summary (Last 24 hours) at 07/20/2020 1446 Last data filed at 07/20/2020 1127 Gross per 24 hour  Intake 832.23 ml  Output 400 ml  Net 432.23 ml   Filed Weights   07/18/20 1441 07/19/20 0359 07/20/20 0413  Weight: 67.4 kg 64.3 kg 67.3 kg    ROS: Review of Systems  Respiratory: Positive for cough and shortness of breath.   Cardiovascular: Negative for chest pain.  Gastrointestinal: Negative for abdominal pain, nausea and vomiting.   Exam: Physical Exam HENT:     Head: Normocephalic.     Mouth/Throat:     Pharynx: No oropharyngeal exudate.  Eyes:     General: Lids are normal.     Conjunctiva/sclera: Conjunctivae normal.     Pupils: Pupils are equal, round, and reactive to light.  Cardiovascular:     Rate and Rhythm: Normal rate and regular rhythm.     Heart sounds: Normal heart sounds, S1 normal and S2 normal.  Pulmonary:     Breath sounds: Examination of the right-middle field reveals decreased breath sounds and wheezing. Examination of the left-middle field reveals decreased breath sounds and wheezing. Examination of the right-lower field reveals decreased breath sounds and wheezing. Examination of the left-lower field reveals decreased breath sounds and wheezing. Decreased breath sounds and wheezing present. No rhonchi or rales.  Abdominal:      Palpations: Abdomen is soft.     Tenderness: There is no abdominal tenderness.  Musculoskeletal:     Right ankle: No swelling.     Left ankle: No swelling.  Skin:    General: Skin is warm.     Findings: No rash.  Neurological:     Mental Status: He is alert and oriented to person, place, and time.       Data Reviewed: Basic Metabolic Panel: Recent Labs  Lab 07/19/20 0037 07/19/20 0037 07/19/20 0913 07/19/20 0913 07/19/20 1351 07/19/20 1711 07/19/20 2009 07/20/20 0117 07/20/20 0847  NA 121*   < > 120*   < > 120* 120* 120* 122* 120*  K 3.8  --  4.0  --   --  3.9  --  3.8 3.7  CL 89*  --  85*  --   --  86*  --  88* 87*  CO2 22  --  23  --   --  24  --  25 23  GLUCOSE 140*  --  169*  --   --  138*  --  146* 140*  BUN 15  --  15  --   --  16  --  15 13  CREATININE 0.73  --  0.75  --   --  0.66  --  0.68 0.67  CALCIUM 8.2*  --  8.9  --   --  8.2*  --  8.2* 8.4*   < > = values in this interval not displayed.   CBC: Recent Labs  Lab 07/17/20 1717 07/19/20 0607 07/20/20 0117  WBC 8.8 19.7* 17.4*  NEUTROABS  --  17.6* 15.2*  HGB 11.0* 10.1* 9.6*  HCT 31.7* 28.1* 27.2*  MCV 87.8 85.4 86.1  PLT 255 259 239   BNP (last 3 results) Recent Labs    07/17/20 1717  BNP 202.0*     Recent Results (from the past 240 hour(s))  Resp Panel by RT-PCR (Flu A&B, Covid) Nasopharyngeal Swab     Status: None   Collection Time: 07/17/20  6:26 PM   Specimen: Nasopharyngeal Swab; Nasopharyngeal(NP) swabs in vial transport medium  Result Value Ref Range Status   SARS Coronavirus 2 by RT PCR NEGATIVE NEGATIVE Final    Comment: (NOTE) SARS-CoV-2 target nucleic acids are NOT DETECTED.  The SARS-CoV-2 RNA is generally detectable in upper respiratory specimens during the acute phase of infection. The lowest concentration of SARS-CoV-2 viral copies this assay can detect is 138 copies/mL. A negative result does not preclude SARS-Cov-2 infection and should not be used as the sole basis  for treatment or other patient management decisions. A negative result may occur with  improper specimen collection/handling, submission of specimen other than nasopharyngeal swab, presence of viral mutation(s) within the areas targeted by this assay, and inadequate number of viral copies(<138 copies/mL). A negative result must be combined with clinical observations, patient history, and epidemiological information. The expected result is Negative.  Fact Sheet for Patients:  BloggerCourse.com  Fact Sheet for Healthcare Providers:  SeriousBroker.it  This test is no t yet approved or cleared by the [place] FDA and  has been authorized for detection and/or diagnosis of SARS-CoV-2 by FDA under an Emergency Use Authorization (EUA). This EUA will remain  in effect (meaning this test can be used) for the duration of the COVID-19 declaration under Section 564(b)(1) of the Act, 21 U.S.C.section 360bbb-3(b)(1), unless the authorization is terminated  or revoked sooner.       Influenza A by PCR NEGATIVE NEGATIVE Final   Influenza B by PCR NEGATIVE NEGATIVE Final    Comment: (NOTE) The Xpert Xpress SARS-CoV-2/FLU/RSV plus assay is intended as an aid in the diagnosis of influenza from Nasopharyngeal swab specimens and should not be used as a sole basis for treatment. Nasal washings and aspirates are unacceptable for Xpert Xpress SARS-CoV-2/FLU/RSV testing.  Fact Sheet for Patients: BloggerCourse.com  Fact Sheet for Healthcare Providers: SeriousBroker.it  This test is not yet approved or cleared by the [place] FDA and has been authorized for detection and/or diagnosis of SARS-CoV-2 by FDA under an Emergency Use Authorization (EUA). This EUA will remain in effect (meaning this test can be used) for the duration of the COVID-19 declaration under Section 564(b)(1) of the Act, 21  U.S.C. section 360bbb-3(b)(1), unless the authorization is terminated or revoked.  Performed at [facility], [address] [place], [place] [address]   Blood culture (routine x 2)     Status: None (Preliminary result)   Collection Time: 07/17/20  7:52 PM   Specimen: BLOOD  Result Value Ref Range Status   Specimen Description BLOOD RIGHT ANTECUBITAL  Final   Special Requests   Final    BOTTLES DRAWN AEROBIC AND ANAEROBIC Blood Culture results may not be optimal due to an excessive volume of blood received in culture bottles   Culture   Final    NO GROWTH 3 DAYS Performed at  [facility], [address] [place], [place] [address]    Report Status PENDING  Incomplete  Blood culture (routine x 2)     Status: None (Preliminary result)   Collection Time: 07/17/20  7:52 PM   Specimen: BLOOD  Result Value Ref Range Status   Specimen Description BLOOD BLOOD LEFT FOREARM  Final   Special Requests   Final    BOTTLES DRAWN AEROBIC AND ANAEROBIC Blood Culture adequate volume   Culture   Final    NO GROWTH 3 DAYS Performed at [facility], [address], [place], [place] [address]    Report Status PENDING  Incomplete     Studies: CT CHEST WO CONTRAST  Result Date: 07/20/2020 CLINICAL DATA:  COPD with shortness of breath and cough. EXAM: CT CHEST WITHOUT CONTRAST TECHNIQUE: Multidetector CT imaging of the chest was performed following the standard protocol without IV contrast. COMPARISON:  Chest CT April 06, 2020; chest radiograph July 18, 2020 FINDINGS: Cardiovascular: There is no evident thoracic aortic aneurysm. There are foci of calcification in visualized great vessels. There is aortic atherosclerosis as well as multiple foci of coronary artery calcification. There is no pericardial effusion or pericardial thickening. Mediastinum/Nodes: Thyroid appears unremarkable. There are subcentimeter mediastinal lymph nodes at several sites. There is a lymph  node anterior to the carina slightly to the right of midline measuring 1.7 x 1.2 cm, also present on prior study. No new adenopathy evident. No appreciable esophageal lesions. Lungs/Pleura: Underlying centrilobular emphysematous change noted. There is extensive airspace opacity consistent with pneumonia involving a portion of the posterior segment right upper lobe. Areas of ill-defined airspace opacity noted in portions of the right middle lobe. There is consolidation and atelectatic change in portions of the right lower lobe. A lesser degree of consolidation with atelectasis is noted in the posterior segment left lower lobe. Small left pleural effusion noted as well. There is scarring and atelectasis in the inferior lingula, stable. There are scattered areas of atelectatic change elsewhere in the right upper lobe. There is a degree of lower lobe bronchiectatic change, stable. Upper Abdomen: There is upper abdominal aortic atherosclerosis. Cholelithiasis incompletely visualized. There is incomplete visualization of a cyst arising from the lateral mid left kidney measuring 3.8 x 2.7 cm. There is mild left adrenal hypertrophy, stable. Musculoskeletal: No blastic or lytic bone lesions. No chest wall lesions are evident. IMPRESSION: 1. Underlying emphysematous change. Stable lower lobe bronchiectatic change. 2. Extensive airspace opacity consistent with pneumonia in the posterior segment right lower lobe. Areas of consolidation in both lower lobes, more severe on the right than the left. Small left pleural effusion noted. Areas of scattered atelectatic change and scarring noted. 3. Enlarged lymph node to the right of midline anterior to the carina, similar to prior study. No new adenopathy. 4. Extensive aortic atherosclerosis. Foci of coronary artery and great vessel calcification noted. 5.  Cholelithiasis, incompletely visualized. 6.  Stable mild left adrenal hypertrophy. Aortic Atherosclerosis (ICD10-I70.0) and  Emphysema (ICD10-J43.9). Electronically Signed   By: Bretta Bang III M.D.   On: 07/20/2020 10:47   DG Chest Port 1 View  Result Date: 07/18/2020 CLINICAL DATA:  Shortness of breath. EXAM: PORTABLE CHEST 1 VIEW COMPARISON:  July 17, 2020. FINDINGS: Stable cardiomediastinal silhouette. No pneumothorax is noted. Bibasilar opacities are noted concerning for subsegmental atelectasis or possibly infiltrates. Small pleural effusions are noted. Bony thorax is unremarkable. IMPRESSION: Bibasilar subsegmental atelectasis or possibly infiltrates are noted with small pleural effusions. Electronically Signed  By: Lupita Raider M.D.   On: 07/18/2020 15:51    Scheduled Meds: . albuterol  2.5 mg Nebulization Q6H  . aspirin EC  81 mg Oral Daily  . atorvastatin  40 mg Oral q1800  . azithromycin  500 mg Oral Daily  . enoxaparin (LOVENOX) injection  40 mg Subcutaneous Q24H  . fluticasone furoate-vilanterol  1 puff Inhalation Daily   And  . umeclidinium bromide  1 puff Inhalation Daily  . guaiFENesin  600 mg Oral Daily  . methylPREDNISolone (SOLU-MEDROL) injection  40 mg Intravenous Daily  . pantoprazole  40 mg Oral Daily  . sodium chloride flush  3 mL Intravenous Q12H  . sodium chloride  1 g Oral TID WC  . tamsulosin  0.4 mg Oral Daily   Continuous Infusions: . sodium chloride    . cefTRIAXone (ROCEPHIN)  IV 2 g (07/19/20 2104)    Assessment/Plan:  1. COPD exacerbation with wheeze.  Start Solu-Medrol nebulizer treatments.  The patient uses Trelegy inhaler at home we have on appropriate inhalers at this point. 2. Multifocal pneumonia seen on CT scan.  Continue Rocephin and Zithromax 3. Severe hyponatremia.  Sodium ranging between 120 and 122.  We will get nephrology consultation.  Started on salt tablets. 4. Acute respiratory failure due to COPD exacerbation pneumonia.  During the hospital course the patient did have a 1 L saturation of 89% (<90%).  Check pulse ox on room air with  ambulation. 5. Weakness.  Physical therapy recommending rehab but patient would like to go back to his assisted living.  Case discussed with transitional care team. 6. Hypertensive urgency on a on presentation with blood pressure as high as 207/92.  Blood pressure slightly high.  Will start low-dose Norvasc. 7. Hyperlipidemia unspecified on atorvastatin 8. GERD on omeprazole 9. BPH on Flomax        Code Status:     Code Status Orders  (From admission, onward)         Start     Ordered   07/18/20 1537  Do not attempt resuscitation (DNR)  Continuous       Question Answer Comment  In the event of cardiac or respiratory ARREST Do not call a "code blue"   In the event of cardiac or respiratory ARREST Do not perform Intubation, CPR, defibrillation or ACLS   In the event of cardiac or respiratory ARREST Use medication by any route, position, wound care, and other measures to relive pain and suffering. May use oxygen, suction and manual treatment of airway obstruction as needed for comfort.      07/18/20 1536        Code Status History    Date Active Date Inactive Code Status Order ID Comments User Context   07/17/2020 1958 07/18/2020 1536 Full Code [id number]  Briscoe Deutscher, MD ED   01/17/2019 1644 01/20/2019 2103 Full Code [id number]  Jama Flavors, MD ED   12/28/2018 1840 12/30/2018 1913 Full Code [id number]  Altamese Dilling, MD ED   11/01/2017 1225 11/06/2017 1702 Full Code [id number]  Ihor Austin, MD Inpatient   10/02/2017 1525 10/03/2017 1756 Full Code [id number]  Annice Needy, MD Inpatient   08/14/2017 0935 08/15/2017 2214 Full Code [id number]  Milagros Loll, MD ED   Advance Care Planning Activity    Advance Directive Documentation     Most Recent Value  Type of Advance Directive Living will  Pre-existing out of facility DNR order (yellow form or pink MOST form) --  "MOST"  Form in Place? --     Family Communication: Daughter at the bedside Disposition Plan: Status is:  Inpatient  Dispo: The patient is from: Assisted living              Anticipated d/c is to: Rehab versus assisted living              Anticipated d/c date is: Would like to see sodium better and wheeze less prior to disposition.  Likely will need a few days here in the hospital              Patient currently being treated for COPD exacerbation and pneumonia and hyponatremia  Consultants:  Nephrology  Antibiotics:  Rocephin  Zithromax  Time spent: 28 minutes  [employer]

## 2020-07-20 NOTE — [place] FL2 (Signed)
[place] MEDICAID FL2 LEVEL OF CARE SCREENING TOOL     IDENTIFICATION  Patient Name: Martin Bean Birthdate: 25-Dec-1929 Sex: male Admission Date (Current Location): 07/17/2020  [place] and [place] Number:  Chiropodist and Address:  [facility], [address] [place], [place] [address]      Provider Number: [id number]  Attending Physician Name and Address:  Alford Highland, MD  Relative Name and Phone Number:  [name]    Current Level of Care: Hospital Recommended Level of Care: Skilled Nursing Facility Prior Approval Number:    Date Approved/Denied:   PASRR Number: [id number]  Discharge Plan: SNF    Current Diagnoses: Patient Active Problem List   Diagnosis Date Noted  . Acute on chronic diastolic CHF (congestive heart failure) (HCC) 07/17/2020  . Hyponatremia 07/17/2020  . Hypertensive urgency 07/17/2020  . Bilateral pneumonia 01/17/2019  . CAP (community acquired pneumonia) 12/28/2018  . Acute renal failure (ARF) (HCC) 12/28/2018  . Pneumonia 12/28/2018  . Hemothorax   . Malnutrition of moderate degree 11/02/2017  . Hydropneumothorax 11/01/2017  . Essential hypertension 10/09/2017  . Hyperlipidemia 10/09/2017  . Carotid arterial disease (HCC) 10/02/2017  . Carotid artery stenosis, unilateral, left 09/04/2017  . CVA (cerebral vascular accident) (HCC) 08/14/2017  . Benign prostatic hyperplasia with urinary frequency 06/03/2017  . Depression, prolonged 06/03/2017  . 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 Psoriasis, unspecified 06/03/2017    Orientation RESPIRATION BLADDER Height & Weight     Self, Time  Normal Incontinent (sometimes) Weight: 67.3 kg Height:  5\' 9"  (175.3 cm)  BEHAVIORAL SYMPTOMS/MOOD NEUROLOGICAL BOWEL NUTRITION STATUS      Incontinent (sometimes) Diet  AMBULATORY STATUS COMMUNICATION OF NEEDS Skin   Extensive Assist (Rolling Walker 5in. wheels) Verbally Normal                        Personal Care Assistance Level of Assistance  Bathing, Feeding, Dressing Bathing Assistance: Limited assistance Feeding assistance: Independent (Set up) Dressing Assistance: Limited assistance     Functional Limitations Info  Sight, Hearing, Speech Sight Info: Adequate Hearing Info: Impaired (hard of hearing speak loud) Speech Info: Adequate    SPECIAL CARE FACTORS FREQUENCY  PT (By licensed PT), OT (By licensed OT)     PT Frequency: 5x week OT Frequency: 5x week            Contractures Contractures Info: Not present    Additional Factors Info  Code Status, Allergies Code Status Info: DNR Allergies Info: None           Current Medications (07/20/2020):  This is the current hospital active medication list Current Facility-Administered Medications  Medication Dose Route Frequency Provider Last Rate Last Admin  . 0.9 %  sodium chloride infusion  250 mL Intravenous PRN Opyd, 14/08/2019, MD      . acetaminophen (TYLENOL) tablet 650 mg  650 mg Oral Q4H PRN Opyd, Lavone Neri, MD   650 mg at 07/19/20 2211  . albuterol (PROVENTIL) (2.5 MG/3ML) 0.083% nebulizer solution 2.5 mg  2.5 mg Nebulization Q6H Wieting, Richard, MD   2.5 mg at 07/20/20 1101  . aspirin EC tablet 81 mg  81 mg Oral Daily 14/01/21, MD   81 mg at 07/20/20 0934  . atorvastatin (LIPITOR) tablet 40 mg  40 mg Oral q1800 14/01/21, MD   40 mg at 07/19/20 1825  . azithromycin (ZITHROMAX) tablet 500 mg  500 mg Oral Daily 07/21/20, RPH      .  cefTRIAXone (ROCEPHIN) 2 g in sodium chloride 0.9 % 100 mL IVPB  2 g Intravenous Q24H Opyd, Lavone Neri, MD 200 mL/hr at 07/19/20 2104 2 g at 07/19/20 2104  . enoxaparin (LOVENOX) injection 40 mg  40 mg Subcutaneous Q24H Hughie Closs, MD   40 mg at 07/19/20 1825  . fluticasone furoate-vilanterol (BREO ELLIPTA) 100-25 MCG/INH 1 puff  1 puff Inhalation Daily Opyd, Lavone Neri, MD   1 puff at 07/20/20 1102   And  . umeclidinium bromide (INCRUSE ELLIPTA) 62.5 MCG/INH 1  puff  1 puff Inhalation Daily Opyd, Lavone Neri, MD   1 puff at 07/20/20 1102  . guaiFENesin (MUCINEX) 12 hr tablet 600 mg  600 mg Oral Q12H PRN Hughie Closs, MD   600 mg at 07/19/20 2101  . guaiFENesin (MUCINEX) 12 hr tablet 600 mg  600 mg Oral Daily Hughie Closs, MD   600 mg at 07/20/20 0934  . labetalol (NORMODYNE) injection 10 mg  10 mg Intravenous Q2H PRN Opyd, Lavone Neri, MD      . methylPREDNISolone sodium succinate (SOLU-MEDROL) 40 mg/mL injection 40 mg  40 mg Intravenous Daily Alford Highland, MD   40 mg at 07/20/20 0949  . ondansetron (ZOFRAN) injection 4 mg  4 mg Intravenous Q6H PRN Opyd, Lavone Neri, MD      . pantoprazole (PROTONIX) EC tablet 40 mg  40 mg Oral Daily Hughie Closs, MD   40 mg at 07/20/20 0935  . sodium chloride flush (NS) 0.9 % injection 3 mL  3 mL Intravenous Q12H Opyd, Lavone Neri, MD   3 mL at 07/20/20 0941  . sodium chloride flush (NS) 0.9 % injection 3 mL  3 mL Intravenous PRN Opyd, Lavone Neri, MD      . sodium chloride tablet 1 g  1 g Oral BID WC Alford Highland, MD   1 g at 07/20/20 0934  . tamsulosin (FLOMAX) capsule 0.4 mg  0.4 mg Oral Daily Hughie Closs, MD   0.4 mg at 07/20/20 8592     Discharge Medications: Please see discharge summary for a list of discharge medications.  Relevant Imaging Results:  Relevant Lab Results:   Additional Information    Hetty Ely, RN

## 2020-07-20 NOTE — Progress Notes (Signed)
Physical Therapy Treatment Patient Details Name: Martin Bean MRN: [id number] DOB: 21-Jul-1930 Today's Date: 07/20/2020    History of Present Illness 86 y.o. male with medical history significant for history of CVA, hypertension, chronic diastolic CHF, and depression, now presenting to the emergency department for evaluation of shortness of breath, cough, nausea, and general malaise.  Patient is accompanied by his daughter who assists with the history.  Patient has seemed to have some general weakness and fatigue for the past 1 to 2 weeks per report of his daughter.  Patient reports that he has developed some nausea, shortness of breath, and productive cough over the past couple days.  He denies any fevers, chills, chest pain, or palpitations.  He has slept in a recliner for years and is unable to comment on orthopnea.  He reports some bilateral lower leg swelling that he describes as chronic.  He had not eaten much today, but denies any vomiting, diarrhea, or loss of appetite until today.  Patient denies any headache and his daughter has not noticed any confusion. He denies difficulty swallowing or choking or coughing when trying to eat or drink.    PT Comments    Patient received in bed, daughter present at bedside. He is agreeable to PT session. Patient performed bed mobility with min guard. Performed sit to stand with mod assist from elevated surface. Increased time needed. Patient able to take a few steps from bed to recliner with RW and min guard. Small shuffle steps, increased time. He requires encouragement to remain seated in recliner for improved lung expansion. Patient will continue to benefit from skilled PT while here to improve functional mobility, strength and independence for return to prior functional level .    Follow Up Recommendations  SNF;Supervision/Assistance - 24 hour     Equipment Recommendations  Other (comment) (TBD)    Recommendations for Other Services        Precautions / Restrictions Precautions Precautions: Fall Restrictions Weight Bearing Restrictions: No    Mobility  Bed Mobility Overal bed mobility: Needs Assistance Bed Mobility: Supine to Sit     Supine to sit: Min guard     General bed mobility comments: with min cuing for technique and use of bed rails  Transfers Overall transfer level: Needs assistance Equipment used: Rolling walker (2 wheeled) Transfers: Sit to/from Stand Sit to Stand: From elevated surface;Min assist         General transfer comment: min lifting assistance and balance assist needed for safety with transfer  Ambulation/Gait Ambulation/Gait assistance: Min assist Gait Distance (Feet): 5 Feet Assistive device: Rolling walker (2 wheeled) Gait Pattern/deviations: Step-to pattern;Decreased step length - right;Decreased step length - left;Shuffle Gait velocity: decreased   General Gait Details: patient able to take a few steps from bed to recliner. Slow, shuffle steps   Stairs             Wheelchair Mobility    Modified Rankin (Stroke Patients Only)       Balance Overall balance assessment: Needs assistance Sitting-balance support: Feet supported Sitting balance-Leahy Scale: Good     Standing balance support: Bilateral upper extremity supported;During functional activity Standing balance-Leahy Scale: Fair Standing balance comment: reliant on RW and external support for safety                            Cognition Arousal/Alertness: Awake/alert Behavior During Therapy: WFL for tasks assessed/performed Overall Cognitive Status: Within Functional Limits for tasks  assessed                                        Exercises Other Exercises Other Exercises: B LE strengthening exercises: AP, hip abd/add, LAQ, marching x 10 reps each    General Comments        Pertinent Vitals/Pain Pain Assessment: No/denies pain    Home Living                       Prior Function            PT Goals (current goals can now be found in the care plan section) Acute Rehab PT Goals Patient Stated Goal: to get better, [facility] PT Goal Formulation: With patient/family Time For Goal Achievement: 08/02/20 Potential to Achieve Goals: Fair Progress towards PT goals: Progressing toward goals    Frequency    Min 2X/week      PT Plan Current plan remains appropriate    Co-evaluation              AM-PAC PT "6 Clicks" Mobility   Outcome Measure  Help needed turning from your back to your side while in a flat bed without using bedrails?: A Little Help needed moving from lying on your back to sitting on the side of a flat bed without using bedrails?: A Little Help needed moving to and from a bed to a chair (including a wheelchair)?: A Little Help needed standing up from a chair using your arms (e.g., wheelchair or bedside chair)?: A Lot Help needed to walk in hospital room?: A Lot Help needed climbing 3-5 steps with a railing? : A Lot 6 Click Score: 15    End of Session Equipment Utilized During Treatment: Gait belt;Oxygen Activity Tolerance: Patient tolerated treatment well;Patient limited by fatigue Patient left: with call bell/phone within reach;in chair;with chair alarm set;with family/visitor present Nurse Communication: Mobility status PT Visit Diagnosis: Unsteadiness on feet (R26.81);Other abnormalities of gait and mobility (R26.89);Muscle weakness (generalized) (M62.81);History of falling (Z91.81);Difficulty in walking, not elsewhere classified (R26.2)     Time: 0263-7858 PT Time Calculation (min) (ACUTE ONLY): 35 min  Charges:  $Gait Training: 8-22 mins $Therapeutic Exercise: 8-22 mins                     Treazure Nery, PT, GCS 07/20/20,1:08 PM

## 2020-07-20 NOTE — Care Management Important Message (Signed)
Important Message  Patient Details  Name: Martin Bean MRN: [id number] Date of Birth: 12-10-1929   Medicare Important Message Given:  N/A - LOS <3 / Initial given by admissions  Initial Medicare IM reviewed with [name], daughter by [name], Patient Access Associate on 07/19/2020 at 11:15am.   [name] 07/20/2020, 8:48 AM

## 2020-07-21 DIAGNOSIS — E871 Hypo-osmolality and hyponatremia: Secondary | ICD-10-CM | POA: Diagnosis not present

## 2020-07-21 DIAGNOSIS — I10 Essential (primary) hypertension: Secondary | ICD-10-CM | POA: Diagnosis not present

## 2020-07-21 DIAGNOSIS — J441 Chronic obstructive pulmonary disease with (acute) exacerbation: Secondary | ICD-10-CM | POA: Diagnosis not present

## 2020-07-21 DIAGNOSIS — J189 Pneumonia, unspecified organism: Secondary | ICD-10-CM | POA: Diagnosis not present

## 2020-07-21 LAB — BASIC METABOLIC PANEL
Anion gap: 10 (ref 5–15)
BUN: 14 mg/dL (ref 8–23)
CO2: 23 mmol/L (ref 22–32)
Calcium: 8.6 mg/dL — ABNORMAL LOW (ref 8.9–10.3)
Chloride: 88 mmol/L — ABNORMAL LOW (ref 98–111)
Creatinine, Ser: 0.54 mg/dL — ABNORMAL LOW (ref 0.61–1.24)
GFR, Estimated: >60 mL/min (ref >60)
Glucose, Bld: 156 mg/dL — ABNORMAL HIGH (ref 70–99)
Potassium: 3.9 mmol/L (ref 3.5–5.1)
Sodium: 121 mmol/L — ABNORMAL LOW (ref 135–145)

## 2020-07-21 MED ORDER — FUROSEMIDE 20 MG PO TABS
20.0000 mg | ORAL_TABLET | Freq: Every day | ORAL | Status: DC
  Administered 2020-07-21 – 2020-07-22 (×2): 20 mg via ORAL
  Filled 2020-07-21 (×2): qty 1

## 2020-07-21 NOTE — Consult Note (Signed)
[address] [place], [place] [address] Phone [phone]. Fax [phone]  Date: 07/21/2020                  Patient Name:  Martin Bean  MRN: [id number]  DOB: [birth year]  Age / Sex: 86 y.o., male         PCP: Gracelyn Nurse, MD                 Service Requesting Consult: IM/ Alford Highland, MD                 Reason for Consult: Hyponatremia            History of Present Illness: Patient is a 86 y.o. male admitted to [facility] on 07/17/2020  Information obtained mostly from the chart Patient has multiple chronic medical issues including history of stroke, hypertension, chronic diastolic CHF, depression, presented to the ER for shortness of breath, cough, nausea and generalized malaise.  Symptoms of general weakness and fatigue 1 to 2 weeks prior to admission.  Nausea, shortness of breath and productive cough 1 to 2 days prior to admission.    In the ER, patient required 2 L oxygen supplementation, was found to be tachypneic and high blood pressure.  Chest x-ray concerning for pneumonia.  Sodium was noted to be low at 122.  Patient was admitted for further evaluation and management and nephrology consult has now been requested for evaluation of hyponatremia  Today, patient's main concern is weakness.  He has a poor appetite.  Denies any problems with voiding.  No blood in the stool Or urine.  He is very hard of hearing therefore review of system is limited.  Medications: Outpatient medications: Medications Prior to Admission  Medication Sig Dispense Refill Last Dose  . albuterol (VENTOLIN HFA) 108 (90 Base) MCG/ACT inhaler Inhale 2 puffs into the lungs 2 (two) times daily as needed for wheezing or shortness of breath.   prn at prn  . aspirin EC 81 MG tablet Take 81 mg by mouth daily.    07/17/2020 at 0800  . atorvastatin (LIPITOR) 40 MG tablet Take 1 tablet (40 mg total) by mouth daily at 6 PM. 30 tablet 0 07/16/2020 at 2000  . Chlorphen-Phenyleph-ASA (ALKA-SELTZER  PLUS COLD) 2-7.8-325 MG TBEF Take 2 tablets by mouth every 8 (eight) hours as needed (for headache or congestion).   prn at prn  . guaiFENesin (MUCINEX) 600 MG 12 hr tablet Take 600 mg by mouth daily.   07/16/2020 at 2000  . guaiFENesin (MUCINEX) 600 MG 12 hr tablet Take 600 mg by mouth every 12 (twelve) hours as needed for cough (for cough or congestion).   prn at prn  . midodrine (PROAMATINE) 10 MG tablet Take 10 mg by mouth 3 (three) times daily.   07/17/2020 at 1400  . omeprazole (PRILOSEC) 40 MG capsule Take 40 mg by mouth daily.   07/17/2020 at 0630  . sertraline (ZOLOFT) 50 MG tablet Take 50 mg by mouth daily.    07/17/2020 at 0800  . tamsulosin (FLOMAX) 0.4 MG CAPS capsule Take 0.4 mg by mouth daily.    07/17/2020 at 1300  . TRELEGY ELLIPTA 100-62.5-25 MCG/INH AEPB Inhale 1 puff into the lungs daily.   07/17/2020 at 0800    Current medications: Current Facility-Administered Medications  Medication Dose Route Frequency Provider Last Rate Last Admin  . 0.9 %  sodium chloride infusion  250 mL Intravenous PRN Opyd, Lavone Neri, MD      .  acetaminophen (TYLENOL) tablet 650 mg  650 mg Oral Q4H PRN Briscoe Deutscher, MD   650 mg at 07/19/20 2211  . albuterol (PROVENTIL) (2.5 MG/3ML) 0.083% nebulizer solution 2.5 mg  2.5 mg Nebulization Q6H Wieting, Richard, MD   2.5 mg at 07/21/20 1408  . amLODipine (NORVASC) tablet 2.5 mg  2.5 mg Oral Daily Alford Highland, MD   2.5 mg at 07/21/20 0957  . aspirin EC tablet 81 mg  81 mg Oral Daily Hughie Closs, MD   81 mg at 07/21/20 0957  . atorvastatin (LIPITOR) tablet 40 mg  40 mg Oral q1800 Hughie Closs, MD   40 mg at 07/20/20 1648  . azithromycin (ZITHROMAX) tablet 500 mg  500 mg Oral Daily Ronnald Ramp, RPH   500 mg at 07/20/20 2035  . cefTRIAXone (ROCEPHIN) 2 g in sodium chloride 0.9 % 100 mL IVPB  2 g Intravenous Q24H Opyd, Lavone Neri, MD 200 mL/hr at 07/20/20 2232 2 g at 07/20/20 2232  . enoxaparin (LOVENOX) injection 40 mg  40 mg Subcutaneous Q24H  Hughie Closs, MD   40 mg at 07/20/20 1510  . fluticasone furoate-vilanterol (BREO ELLIPTA) 100-25 MCG/INH 1 puff  1 puff Inhalation Daily Opyd, Lavone Neri, MD   1 puff at 07/21/20 1008   And  . umeclidinium bromide (INCRUSE ELLIPTA) 62.5 MCG/INH 1 puff  1 puff Inhalation Daily Opyd, Lavone Neri, MD   1 puff at 07/21/20 1008  . guaiFENesin (MUCINEX) 12 hr tablet 600 mg  600 mg Oral Q12H PRN Hughie Closs, MD   600 mg at 07/19/20 2101  . guaiFENesin (MUCINEX) 12 hr tablet 600 mg  600 mg Oral Daily Hughie Closs, MD   600 mg at 07/21/20 0957  . labetalol (NORMODYNE) injection 10 mg  10 mg Intravenous Q2H PRN Opyd, Lavone Neri, MD      . methylPREDNISolone sodium succinate (SOLU-MEDROL) 40 mg/mL injection 40 mg  40 mg Intravenous Daily Alford Highland, MD   40 mg at 07/21/20 1008  . ondansetron (ZOFRAN) injection 4 mg  4 mg Intravenous Q6H PRN Opyd, Lavone Neri, MD      . pantoprazole (PROTONIX) EC tablet 40 mg  40 mg Oral Daily Hughie Closs, MD   40 mg at 07/21/20 0957  . sodium chloride flush (NS) 0.9 % injection 3 mL  3 mL Intravenous Q12H Opyd, Lavone Neri, MD   3 mL at 07/21/20 1008  . sodium chloride flush (NS) 0.9 % injection 3 mL  3 mL Intravenous PRN Opyd, Lavone Neri, MD      . sodium chloride tablet 1 g  1 g Oral TID WC Alford Highland, MD   1 g at 07/21/20 1242  . tamsulosin (FLOMAX) capsule 0.4 mg  0.4 mg Oral Daily Hughie Closs, MD   0.4 mg at 07/21/20 1610      Allergies: No Known Allergies    Past Medical History: Past Medical History:  Diagnosis Date  . Carotid arterial disease (HCC) 10/02/2017  . CVA (cerebral vascular accident) (HCC) 08/14/2017  . Depression   . Diastolic heart failure (HCC)   . Family history of adverse reaction to anesthesia   . HOH (hard of hearing)   . HTN (hypertension)   . Hydropneumothorax 11/01/2017  . Hyperlipidemia 10/09/2017  . Malnutrition of moderate degree 11/02/2017  . Pneumonia   . Prostate enlargement   . Psoriasis      Past Surgical  History: Past Surgical History:  Procedure Laterality Date  . CATARACT EXTRACTION  W/PHACO Right 02/25/2018   Procedure: CATARACT EXTRACTION PHACO AND INTRAOCULAR LENS PLACEMENT (IOC);  Surgeon: Galen Manila, MD;  Location: [facility] ORS;  Service: Ophthalmology;  Laterality: Right;  [place] 01:03.2 AP% 17.1 CDE 10.83 Fluid Pack lot #[id number]  . CATARACT EXTRACTION W/PHACO Left 03/18/2018   Procedure: CATARACT EXTRACTION PHACO AND INTRAOCULAR LENS PLACEMENT (IOC);  Surgeon: Galen Manila, MD;  Location: [facility] ORS;  Service: Ophthalmology;  Laterality: Left;  [place] 1.11 AP% 16.7 CDE 11.90 Fluid pack lot # [id number]  . ENDARTERECTOMY Left 10/02/2017   Procedure: ENDARTERECTOMY CAROTID;  Surgeon: Annice Needy, MD;  Location: [facility] ORS;  Service: Vascular;  Laterality: Left;  . HERNIA REPAIR       Family History: Family History  Problem Relation Age of Onset  . Stroke Mother      Social History: Social History   Socioeconomic History  . Marital status: Widowed    Spouse name: Not on file  . Number of children: Not on file  . Years of education: Not on file  . Highest education level: Not on file  Occupational History  . Occupation: retired  Tobacco Use  . Smoking status: Former Smoker    Types: Cigarettes    Quit date: 09/24/2013    Years since quitting: 6.8  . Smokeless tobacco: Never Used  Vaping Use  . Vaping Use: Never used  Substance and Sexual Activity  . Alcohol use: No  . Drug use: No  . Sexual activity: Not on file  Other Topics Concern  . Not on file  Social History Narrative  . Not on file   Social Determinants of Health   Financial Resource Strain:   . Difficulty of Paying Living Expenses: Not on file  Food Insecurity:   . Worried About Programme researcher, broadcasting/film/video in the Last Year: Not on file  . Ran Out of Food in the Last Year: Not on file  Transportation Needs:   . Lack of Transportation (Medical): Not on file  . Lack of Transportation (Non-Medical): Not on file   Physical Activity:   . Days of Exercise per Week: Not on file  . Minutes of Exercise per Session: Not on file  Stress:   . Feeling of Stress : Not on file  Social Connections:   . Frequency of Communication with Friends and Family: Not on file  . Frequency of Social Gatherings with Friends and Family: Not on file  . Attends Religious Services: Not on file  . Active Member of Clubs or Organizations: Not on file  . Attends Banker Meetings: Not on file  . Marital Status: Not on file  Intimate Partner Violence:   . Fear of Current or Ex-Partner: Not on file  . Emotionally Abused: Not on file  . Physically Abused: Not on file  . Sexually Abused: Not on file     Review of Systems: Limited ROS.  See HPI Gen:  HEENT:  CV:  Resp:  GI: GU :  MS:  Derm:    Psych: Heme:  Neuro:  Endocrine  Vital Signs: Blood pressure 136/63, pulse 83, temperature (!) 97.3 F (36.3 C), temperature source Oral, resp. rate 16, height 5\' 9"  (1.753 m), weight 64.5 kg, SpO2 93 %.   Intake/Output Summary (Last 24 hours) at 07/21/2020 1513 Last data filed at 07/21/2020 1345 Gross per 24 hour  Intake 360 ml  Output 800 ml  Net -440 ml    Weight trends: 14/09/2019   07/19/20 0359 07/20/20 0413  07/21/20 0357  Weight: 64.3 kg 67.3 kg 64.5 kg   Physical Exam: General:  No acute distress, laying in the bed, thin, cachectic  HEENT  anicteric, moist oral mucous membrane  Pulm/lungs  2 L oxygen, bilateral diffuse coarse crackles  CVS/Heart  regular rhythm, no rub or gallop  Abdomen:   Soft, nontender  Extremities:  Trace peripheral edema  Neurologic:  Alert, oriented, able to follow commands  Skin:  No acute rashes  External catheter    Lab results: Basic Metabolic Panel: Recent Labs  Lab 07/20/20 0117 07/20/20 0847 07/21/20 0620  NA 122* 120* 121*  K 3.8 3.7 3.9  CL 88* 87* 88*  CO2 25 23 23   GLUCOSE 146* 140* 156*  BUN 15 13 14   CREATININE 0.68 0.67 0.54*  CALCIUM  8.2* 8.4* 8.6*    Liver Function Tests: No results for input(s): AST, ALT, ALKPHOS, BILITOT, PROT, ALBUMIN in the last 168 hours. No results for input(s): LIPASE, AMYLASE in the last 168 hours. No results for input(s): AMMONIA in the last 168 hours.  CBC: Recent Labs  Lab 07/19/20 0607 07/20/20 0117  WBC 19.7* 17.4*  NEUTROABS 17.6* 15.2*  HGB 10.1* 9.6*  HCT 28.1* 27.2*  MCV 85.4 86.1  PLT 259 239    Cardiac Enzymes: No results for input(s): CKTOTAL, TROPONINI in the last 168 hours.  BNP: Invalid input(s): POCBNP  CBG: No results for input(s): GLUCAP in the last 168 hours.  Microbiology: Recent Results (from the past 720 hour(s))  Resp Panel by RT-PCR (Flu A&B, Covid) Nasopharyngeal Swab     Status: None   Collection Time: 07/17/20  6:26 PM   Specimen: Nasopharyngeal Swab; Nasopharyngeal(NP) swabs in vial transport medium  Result Value Ref Range Status   SARS Coronavirus 2 by RT PCR NEGATIVE NEGATIVE Final    Comment: (NOTE) SARS-CoV-2 target nucleic acids are NOT DETECTED.  The SARS-CoV-2 RNA is generally detectable in upper respiratory specimens during the acute phase of infection. The lowest concentration of SARS-CoV-2 viral copies this assay can detect is 138 copies/mL. A negative result does not preclude SARS-Cov-2 infection and should not be used as the sole basis for treatment or other patient management decisions. A negative result may occur with  improper specimen collection/handling, submission of specimen other than nasopharyngeal swab, presence of viral mutation(s) within the areas targeted by this assay, and inadequate number of viral copies(<138 copies/mL). A negative result must be combined with clinical observations, patient history, and epidemiological information. The expected result is Negative.  Fact Sheet for Patients:  14/01/21  Fact Sheet for Healthcare Providers:   07/19/20  This test is no t yet approved or cleared by the [employer] FDA and  has been authorized for detection and/or diagnosis of SARS-CoV-2 by FDA under an Emergency Use Authorization (EUA). This EUA will remain  in effect (meaning this test can be used) for the duration of the COVID-19 declaration under Section 564(b)(1) of the Act, 21 U.S.C.section 360bbb-3(b)(1), unless the authorization is terminated  or revoked sooner.       Influenza A by PCR NEGATIVE NEGATIVE Final   Influenza B by PCR NEGATIVE NEGATIVE Final    Comment: (NOTE) The Xpert Xpress SARS-CoV-2/FLU/RSV plus assay is intended as an aid in the diagnosis of influenza from Nasopharyngeal swab specimens and should not be used as a sole basis for treatment. Nasal washings and aspirates are unacceptable for Xpert Xpress SARS-CoV-2/FLU/RSV testing.  Fact Sheet for Patients: SeriousBroker[employer]it  Fact Sheet for Healthcare  Providers: SeriousBroker.it  This test is not yet approved or cleared by the [place] and has been authorized for detection and/or diagnosis of SARS-CoV-2 by FDA under an Emergency Use Authorization (EUA). This EUA will remain in effect (meaning this test can be used) for the duration of the COVID-19 declaration under Section 564(b)(1) of the Act, 21 U.S.C. section 360bbb-3(b)(1), unless the authorization is terminated or revoked.  Performed at [facility], [address] [place], [place] [address]   Blood culture (routine x 2)     Status: None (Preliminary result)   Collection Time: 07/17/20  7:52 PM   Specimen: BLOOD  Result Value Ref Range Status   Specimen Description BLOOD RIGHT ANTECUBITAL  Final   Special Requests   Final    BOTTLES DRAWN AEROBIC AND ANAEROBIC Blood Culture results may not be optimal due to an excessive volume of blood received in culture bottles   Culture   Final     NO GROWTH 4 DAYS Performed at [facility], [address] [place], [place] [address]    Report Status PENDING  Incomplete  Blood culture (routine x 2)     Status: None (Preliminary result)   Collection Time: 07/17/20  7:52 PM   Specimen: BLOOD  Result Value Ref Range Status   Specimen Description BLOOD BLOOD LEFT FOREARM  Final   Special Requests   Final    BOTTLES DRAWN AEROBIC AND ANAEROBIC Blood Culture adequate volume   Culture   Final    NO GROWTH 4 DAYS Performed at [facility], [address], [place], [place] [address]    Report Status PENDING  Incomplete     Coagulation Studies: No results for input(s): LABPROT, INR in the last 72 hours.  Urinalysis: No results for input(s): COLORURINE, LABSPEC, PHURINE, GLUCOSEU, HGBUR, BILIRUBINUR, KETONESUR, PROTEINUR, UROBILINOGEN, NITRITE, LEUKOCYTESUR in the last 72 hours.  Invalid input(s): APPERANCEUR      Imaging: CT CHEST WO CONTRAST  Result Date: 07/20/2020 CLINICAL DATA:  COPD with shortness of breath and cough. EXAM: CT CHEST WITHOUT CONTRAST TECHNIQUE: Multidetector CT imaging of the chest was performed following the standard protocol without IV contrast. COMPARISON:  Chest CT April 06, 2020; chest radiograph July 18, 2020 FINDINGS: Cardiovascular: There is no evident thoracic aortic aneurysm. There are foci of calcification in visualized great vessels. There is aortic atherosclerosis as well as multiple foci of coronary artery calcification. There is no pericardial effusion or pericardial thickening. Mediastinum/Nodes: Thyroid appears unremarkable. There are subcentimeter mediastinal lymph nodes at several sites. There is a lymph node anterior to the carina slightly to the right of midline measuring 1.7 x 1.2 cm, also present on prior study. No new adenopathy evident. No appreciable esophageal lesions. Lungs/Pleura: Underlying centrilobular emphysematous change noted. There is extensive airspace  opacity consistent with pneumonia involving a portion of the posterior segment right upper lobe. Areas of ill-defined airspace opacity noted in portions of the right middle lobe. There is consolidation and atelectatic change in portions of the right lower lobe. A lesser degree of consolidation with atelectasis is noted in the posterior segment left lower lobe. Small left pleural effusion noted as well. There is scarring and atelectasis in the inferior lingula, stable. There are scattered areas of atelectatic change elsewhere in the right upper lobe. There is a degree of lower lobe bronchiectatic change, stable. Upper Abdomen: There is upper abdominal aortic atherosclerosis. Cholelithiasis incompletely visualized. There is incomplete visualization of a cyst arising from the lateral  mid left kidney measuring 3.8 x 2.7 cm. There is mild left adrenal hypertrophy, stable. Musculoskeletal: No blastic or lytic bone lesions. No chest wall lesions are evident. IMPRESSION: 1. Underlying emphysematous change. Stable lower lobe bronchiectatic change. 2. Extensive airspace opacity consistent with pneumonia in the posterior segment right lower lobe. Areas of consolidation in both lower lobes, more severe on the right than the left. Small left pleural effusion noted. Areas of scattered atelectatic change and scarring noted. 3. Enlarged lymph node to the right of midline anterior to the carina, similar to prior study. No new adenopathy. 4. Extensive aortic atherosclerosis. Foci of coronary artery and great vessel calcification noted. 5.  Cholelithiasis, incompletely visualized. 6.  Stable mild left adrenal hypertrophy. Aortic Atherosclerosis (ICD10-I70.0) and Emphysema (ICD10-J43.9). Electronically Signed   By: Bretta BangWilliam  Woodruff III M.D.   On: 07/20/2020 10:47      Assessment & Plan: Pt is a 86 y.o. Caucasian   male with history of stroke, hypertension, chronic diastolic CHF, depression, emphysema aortic atherosclerosis,  coronary calcifications was admitted on 07/17/2020 with Shortness of breath [R06.02] Hyponatremia [E87.1] Primary hypertension [I10] Hypoxia [R09.02] Other congestive heart failure (HCC) [I50.9] Acute on chronic diastolic CHF (congestive heart failure) (HCC) [I50.33]   #Hyponatremia Patient with underlying pneumonia/lung disease/emphysema Urine osmolality 430 Urine sodium low at 121 this morning Home medication list does not have thiazide diuretics Previously known sodium of 137 on January 20, 2019 TSH normal 06/2020  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  We will add low-dose Lasix.  If this combination does not work, will consider tolvaptan tomorrow.  # Pneumonia  treatment with iv rocephin and axithromycin as per hospitalist team    LOS: 4 [place] Sorn 12/2/20213:13 PM    Note: This note was prepared with Dragon dictation. Any transcription errors are unintentional

## 2020-07-21 NOTE — Progress Notes (Signed)
Speech Language Pathology Treatment: Dysphagia  Patient Details Name: Martin Bean MRN: [id number] DOB: 04/22/1930 Today's Date: 07/21/2020 Time: 1220-1310 SLP Time Calculation (min) (ACUTE ONLY): 50 min  Assessment / Plan / Recommendation Clinical Impression  Pt seen today for ongoing assessment of toleration of current Dysphagia level 2 (Minced foods) diet w/ thin liquids. Pt has a Baseline of pharyngeal-esophageal phase dysphagia per MBSS in 2020 revealing increased pharyngeal residue not clearing the UES into the Cervical Esophagus and need for f/u, Dry swallows to clear. Pt had been Pureeing his foods at that time d/t Edentulous status and endorsed having PNA "3x".  Pt required Mod+ positioning Fully Upright in bed for po's this session. He appears weak and unable to move about in bed w/out support. Dtr stated pt remains in a recliner at [place] for sleeping and during the day. Pt followed instructions and agreed w/ education on general aspiration precautions including small, single sips. Pt consumed single sips of water(thin) w/ no immediate overt s/s of aspiration noted; clear vocal quality followed. A delayed cough noted x1 post all trials taken. Pt used a rest break b/t trials d/t quick SOB w/ any exertion including Talking.  Discussed w/ Dtr and pt that he was Not eating the Minced consistency diet as requested at the BSE. Discussed the benefits of blended foods; noted these were the types of foods ordered and eaten at the lunch meal today(pureed pineapple, mashed potatoes, ice cream). Dtr agreed w/ modifying the diet to Pureed for admission. Discussed food preparation, use of condiments and cream soups w/ Dtr, pt. Recommend a Dysphagia level 1 w/ thin liquids via Cup or Bottle(pt prefers this only) w/ strict aspiration precautions, tray setup and support w/ positioning. Pills in Puree Crushed as needed for ease, safety of swallowing. Monitoring during meals. Recommend f/u w/ Palliative Care for  GOC; Dietician for nutritional support. ST services will continue to follow and monitor pt's status while admitted. NSG/MD updated.     HPI HPI: Pt is a 86 y.o. male with medical history significant for history of HOH w/ aid, prostate enlargement, Malnutrition of moderate degree, hypertension, chronic diastolic CHF, CVA, and depression, now presenting to the emergency department for evaluation of shortness of breath, cough, nausea, and general malaise.  Patient is accompanied by his daughter who assists with the history.  Patient has seemed to have some general weakness and fatigue for the past 1 to 2 weeks per report of his daughter.  Patient reports that he has developed some nausea, shortness of breath, and productive cough over the past couple days.  He denies any fevers, chills, chest pain, or palpitations.  He has slept in a recliner for years and is unable to comment on orthopnea.  He reports some bilateral lower leg swelling that he describes as chronic.  Upon arrival to the ED, patient is found to be afebrile, saturating 94% on 2 L/min of supplemental oxygen, tachypneic in the upper 20s, and with blood pressure as high as 207/92.  EKG features sinus rhythm with PVCs.  Chest x-ray demonstrates left lower lobe and right middle lobe opacities concerning for pneumonia.  Chemistry panel features a sodium of 122.  CBC with slight normocytic anemia.  High-sensitivity troponin is normal and BNP is elevated to 202.  COVID-19 PCR is negative.  Patient was given 40 mg IV Lasix, Rocephin, and azithromycin in the ED.  Patient resides a local SNF, [place].  He stated he had been "pureeing my foods" for  easier eating/mastication d/t Edentulous status but w/ some chopped foods too. He also endorsed drinking Ensure as a Supplement.       SLP Plan  Continue with current plan of care       Recommendations  Diet recommendations: Dysphagia 1 (puree);Thin liquid Liquids provided via: Cup;No  straw Medication Administration: Whole meds with puree (vs need to Crush them for safer swallowing) Supervision: Patient able to self feed;Staff to assist with self feeding;Intermittent supervision to cue for compensatory strategies Compensations: Minimize environmental distractions;Slow rate;Small sips/bites;Lingual sweep for clearance of pocketing;Multiple dry swallows after each bite/sip;Follow solids with liquid Postural Changes and/or Swallow Maneuvers: Seated upright 90 degrees;Upright 30-60 min after meal                General recommendations:  (Palliative Care consult for GOC; Dietician f/u) Oral Care Recommendations: Oral care BID;Oral care before and after PO;Staff/trained caregiver to provide oral care Follow up Recommendations:  (TBD) SLP Visit Diagnosis: Dysphagia, oropharyngeal phase (R13.12);Dysphagia, pharyngoesophageal phase (R13.14) (baseline Pulmonary decline) Plan: Continue with current plan of care       GO                Jerilynn Som, MS, CCC-SLP Speech Language Pathologist Rehab Services [phone] [facility] 07/21/2020, 2:53 PM

## 2020-07-21 NOTE — Progress Notes (Signed)
Patient ID: Martin Bean, male   DOB: 05/23/1930, 86 y.o.   MRN: [id number] [facility] PROGRESS NOTE  Martin Bean [id number]:[id number] DOB: 11/09/1929 DOA: 07/17/2020 PCP: [name], Martin D, MD  HPI/Subjective: Patient still not breathing well.  Still some shortness of breath.  Still some cough.  Patient not eating very well.  Admitted initially on 07/17/2020 with shortness of breath, nausea, fatigue and cough.  Objective: Vitals:   07/21/20 0357 07/21/20 0812  BP: 131/62 (!) 154/67  Pulse: 84 87  Resp:  16  Temp: 97.6 F (36.4 C) 97.8 F (36.6 C)  SpO2: 94% 92%    Intake/Output Summary (Last 24 hours) at 07/21/2020 1043 Last data filed at 07/21/2020 1000 Gross per 24 hour  Intake 240 ml  Output 800 ml  Net -560 ml   Filed Weights   07/19/20 0359 07/20/20 0413 07/21/20 0357  Weight: 64.3 kg 67.3 kg 64.5 kg    ROS: Review of Systems  Respiratory: Positive for cough and shortness of breath.   Cardiovascular: Negative for chest pain.  Gastrointestinal: Negative for abdominal pain, nausea and vomiting.   Exam: Physical Exam HENT:     Head: Normocephalic.     Mouth/Throat:     Pharynx: No oropharyngeal exudate.  Eyes:     General: Lids are normal.     Conjunctiva/sclera: Conjunctivae normal.     Pupils: Pupils are equal, round, and reactive to light.  Cardiovascular:     Rate and Rhythm: Normal rate and regular rhythm.     Heart sounds: Normal heart sounds, S1 normal and S2 normal.  Pulmonary:     Breath sounds: Examination of the right-middle field reveals decreased breath sounds and wheezing. Examination of the left-middle field reveals decreased breath sounds and wheezing. Examination of the right-lower field reveals decreased breath sounds and rhonchi. Examination of the left-lower field reveals decreased breath sounds and rhonchi. Decreased breath sounds, wheezing and rhonchi present. No rales.  Abdominal:     Palpations: Abdomen is soft.     Tenderness:  There is no abdominal tenderness.  Musculoskeletal:     Right lower leg: No swelling.     Left lower leg: No swelling.  Skin:    General: Skin is warm.     Findings: No rash.  Neurological:     Mental Status: He is alert and oriented to person, place, and time.       Data Reviewed: Basic Metabolic Panel: Recent Labs  Lab 07/19/20 0913 07/19/20 1351 07/19/20 1711 07/19/20 2009 07/20/20 0117 07/20/20 0847 07/21/20 0620  NA 120*   < > 120* 120* 122* 120* 121*  K 4.0  --  3.9  --  3.8 3.7 3.9  CL 85*  --  86*  --  88* 87* 88*  CO2 23  --  24  --  25 23 23   GLUCOSE 169*  --  138*  --  146* 140* 156*  BUN 15  --  16  --  15 13 14   CREATININE 0.75  --  0.66  --  0.68 0.67 0.54*  CALCIUM 8.9  --  8.2*  --  8.2* 8.4* 8.6*   < > = values in this interval not displayed.   CBC: Recent Labs  Lab 07/17/20 1717 07/19/20 0607 07/20/20 0117  WBC 8.8 19.7* 17.4*  NEUTROABS  --  17.6* 15.2*  HGB 11.0* 10.1* 9.6*  HCT 31.7* 28.1* 27.2*  MCV 87.8 85.4 86.1  PLT 255 259 239  BNP (last 3 results) Recent Labs    07/17/20 1717  BNP 202.0*      Recent Results (from the past 240 hour(s))  Resp Panel by RT-PCR (Flu A&B, Covid) Nasopharyngeal Swab     Status: None   Collection Time: 07/17/20  6:26 PM   Specimen: Nasopharyngeal Swab; Nasopharyngeal(NP) swabs in vial transport medium  Result Value Ref Range Status   SARS Coronavirus 2 by RT PCR NEGATIVE NEGATIVE Final    Comment: (NOTE) SARS-CoV-2 target nucleic acids are NOT DETECTED.  The SARS-CoV-2 RNA is generally detectable in upper respiratory specimens during the acute phase of infection. The lowest concentration of SARS-CoV-2 viral copies this assay can detect is 138 copies/mL. A negative result does not preclude SARS-Cov-2 infection and should not be used as the sole basis for treatment or other patient management decisions. A negative result may occur with  improper specimen collection/handling, submission of  specimen other than nasopharyngeal swab, presence of viral mutation(s) within the areas targeted by this assay, and inadequate number of viral copies(<138 copies/mL). A negative result must be combined with clinical observations, patient history, and epidemiological information. The expected result is Negative.  Fact Sheet for Patients:  BloggerCourse.com  Fact Sheet for Healthcare Providers:  SeriousBroker.it  This test is no t yet approved or cleared by the [place] FDA and  has been authorized for detection and/or diagnosis of SARS-CoV-2 by FDA under an Emergency Use Authorization (EUA). This EUA will remain  in effect (meaning this test can be used) for the duration of the COVID-19 declaration under Section 564(b)(1) of the Act, 21 U.S.C.section 360bbb-3(b)(1), unless the authorization is terminated  or revoked sooner.       Influenza A by PCR NEGATIVE NEGATIVE Final   Influenza B by PCR NEGATIVE NEGATIVE Final    Comment: (NOTE) The Xpert Xpress SARS-CoV-2/FLU/RSV plus assay is intended as an aid in the diagnosis of influenza from Nasopharyngeal swab specimens and should not be used as a sole basis for treatment. Nasal washings and aspirates are unacceptable for Xpert Xpress SARS-CoV-2/FLU/RSV testing.  Fact Sheet for Patients: BloggerCourse.com  Fact Sheet for Healthcare Providers: SeriousBroker.it  This test is not yet approved or cleared by the [place] FDA and has been authorized for detection and/or diagnosis of SARS-CoV-2 by FDA under an Emergency Use Authorization (EUA). This EUA will remain in effect (meaning this test can be used) for the duration of the COVID-19 declaration under Section 564(b)(1) of the Act, 21 U.S.C. section 360bbb-3(b)(1), unless the authorization is terminated or revoked.  Performed at [facility], [address] [place], [place] [address]   Blood culture (routine x 2)     Status: None (Preliminary result)   Collection Time: 07/17/20  7:52 PM   Specimen: BLOOD  Result Value Ref Range Status   Specimen Description BLOOD RIGHT ANTECUBITAL  Final   Special Requests   Final    BOTTLES DRAWN AEROBIC AND ANAEROBIC Blood Culture results may not be optimal due to an excessive volume of blood received in culture bottles   Culture   Final    NO GROWTH 4 DAYS Performed at [facility], [address] [place], [place] [address]    Report Status PENDING  Incomplete  Blood culture (routine x 2)     Status: None (Preliminary result)   Collection Time: 07/17/20  7:52 PM   Specimen: BLOOD  Result Value Ref Range Status   Specimen Description BLOOD BLOOD LEFT FOREARM  Final   Special Requests   Final    BOTTLES DRAWN AEROBIC AND ANAEROBIC Blood Culture adequate volume   Culture   Final    NO GROWTH 4 DAYS Performed at [facility], [address] [place], [place] [address]    Report Status PENDING  Incomplete     Studies: CT CHEST WO CONTRAST  Result Date: 07/20/2020 CLINICAL DATA:  COPD with shortness of breath and cough. EXAM: CT CHEST WITHOUT CONTRAST TECHNIQUE: Multidetector CT imaging of the chest was performed following the standard protocol without IV contrast. COMPARISON:  Chest CT April 06, 2020; chest radiograph July 18, 2020 FINDINGS: Cardiovascular: There is no evident thoracic aortic aneurysm. There are foci of calcification in visualized great vessels. There is aortic atherosclerosis as well as multiple foci of coronary artery calcification. There is no pericardial effusion or pericardial thickening. Mediastinum/Nodes: Thyroid appears unremarkable. There are subcentimeter mediastinal lymph nodes at several sites. There is a lymph node anterior to the carina slightly to the right of midline measuring 1.7 x 1.2 cm, also present on prior study. No new adenopathy evident. No  appreciable esophageal lesions. Lungs/Pleura: Underlying centrilobular emphysematous change noted. There is extensive airspace opacity consistent with pneumonia involving a portion of the posterior segment right upper lobe. Areas of ill-defined airspace opacity noted in portions of the right middle lobe. There is consolidation and atelectatic change in portions of the right lower lobe. A lesser degree of consolidation with atelectasis is noted in the posterior segment left lower lobe. Small left pleural effusion noted as well. There is scarring and atelectasis in the inferior lingula, stable. There are scattered areas of atelectatic change elsewhere in the right upper lobe. There is a degree of lower lobe bronchiectatic change, stable. Upper Abdomen: There is upper abdominal aortic atherosclerosis. Cholelithiasis incompletely visualized. There is incomplete visualization of a cyst arising from the lateral mid left kidney measuring 3.8 x 2.7 cm. There is mild left adrenal hypertrophy, stable. Musculoskeletal: No blastic or lytic bone lesions. No chest wall lesions are evident. IMPRESSION: 1. Underlying emphysematous change. Stable lower lobe bronchiectatic change. 2. Extensive airspace opacity consistent with pneumonia in the posterior segment right lower lobe. Areas of consolidation in both lower lobes, more severe on the right than the left. Small left pleural effusion noted. Areas of scattered atelectatic change and scarring noted. 3. Enlarged lymph node to the right of midline anterior to the carina, similar to prior study. No new adenopathy. 4. Extensive aortic atherosclerosis. Foci of coronary artery and great vessel calcification noted. 5.  Cholelithiasis, incompletely visualized. 6.  Stable mild left adrenal hypertrophy. Aortic Atherosclerosis (ICD10-I70.0) and Emphysema (ICD10-J43.9). Electronically Signed   By: Bretta Bang III M.D.   On: 07/20/2020 10:47    Scheduled Meds: . albuterol  2.5 mg  Nebulization Q6H  . amLODipine  2.5 mg Oral Daily  . aspirin EC  81 mg Oral Daily  . atorvastatin  40 mg Oral q1800  . azithromycin  500 mg Oral Daily  . enoxaparin (LOVENOX) injection  40 mg Subcutaneous Q24H  . fluticasone furoate-vilanterol  1 puff Inhalation Daily   And  . umeclidinium bromide  1 puff Inhalation Daily  . guaiFENesin  600 mg Oral Daily  . methylPREDNISolone (SOLU-MEDROL) injection  40 mg Intravenous Daily  . pantoprazole  40 mg Oral Daily  . sodium chloride flush  3 mL Intravenous Q12H  . sodium chloride  1 g Oral TID WC  . tamsulosin  0.4 mg Oral Daily  Continuous Infusions: . sodium chloride    . cefTRIAXone (ROCEPHIN)  IV 2 g (07/20/20 2232)    Assessment/Plan:  1. COPD exacerbation with wheeze.  Continue Solu-Medrol which was started yesterday.  Continue nebulizer treatments.  The patient uses Trelegy inhaler at home and we have on equivalent inhalers here. 2. Multifocal pneumonia seen on CT scan of the chest yesterday.  Continue Rocephin and Zithromax to completion. 3. Severe hyponatremia.  Sodium today still low at 121.  I started salt tablets yesterday.  Nephrology to see today and will consider tolvaptan. 4. Acute hypoxic respiratory failure secondary to COPD exacerbation.  During the hospital course the patient did have an oxygen saturation of 89% on 1 L which is less than the 90%.  Pulse ox on room air this morning was okay but placed back on 1 L of oxygen. 5. Weakness.  Physical therapy recommended rehab.  Patient interested in going back to assisted living.  Continue physical therapy strengthening. 6. Hypertensive urgency on presentation.  Now with essential hypertension.  On low-dose Norvasc. 7. Hyperlipidemia unspecified on atorvastatin 8. GERD.  Continue omeprazole 9. BPH.  Continue Flomax     Code Status:     Code Status Orders  (From admission, onward)         Start     Ordered   07/18/20 1537  Do not attempt resuscitation (DNR)   Continuous       Question Answer Comment  In the event of cardiac or respiratory ARREST Do not call a "code blue"   In the event of cardiac or respiratory ARREST Do not perform Intubation, CPR, defibrillation or ACLS   In the event of cardiac or respiratory ARREST Use medication by any route, position, wound care, and other measures to relive pain and suffering. May use oxygen, suction and manual treatment of airway obstruction as needed for comfort.      07/18/20 1536        Code Status History    Date Active Date Inactive Code Status Order ID Comments User Context   07/17/2020 1958 07/18/2020 1536 Full Code [id number]  Briscoe Deutscher, MD ED   01/17/2019 1644 01/20/2019 2103 Full Code [id number]  Jama Flavors, MD ED   12/28/2018 1840 12/30/2018 1913 Full Code [id number]  Altamese Dilling, MD ED   11/01/2017 1225 11/06/2017 1702 Full Code [id number]  Ihor Austin, MD Inpatient   10/02/2017 1525 10/03/2017 1756 Full Code [id number]  Annice Needy, MD Inpatient   08/14/2017 0935 08/15/2017 2214 Full Code [id number]  Milagros Loll, MD ED   Advance Care Planning Activity    Advance Directive Documentation     Most Recent Value  Type of Advance Directive Living will  Pre-existing out of facility DNR order (yellow form or pink MOST form) --  "MOST" Form in Place? --     Family Communication: Family at bedside Disposition Plan: Status is: Inpatient  Dispo: The patient is from: Assisted living              Anticipated d/c is to: Rehab versus assisted living depending on how he does with physical therapy              Anticipated d/c date is: Likely still need a few days here with wheezing and low sodium              Patient not medically stable for discharge at this point in time  Consultants:  Nephrology  Antibiotics:  Rocephin  Zithromax  Time spent: 28 minutes  Remee Charley [employer]

## 2020-07-22 DIAGNOSIS — I10 Essential (primary) hypertension: Secondary | ICD-10-CM | POA: Diagnosis not present

## 2020-07-22 DIAGNOSIS — J189 Pneumonia, unspecified organism: Secondary | ICD-10-CM | POA: Diagnosis not present

## 2020-07-22 DIAGNOSIS — E871 Hypo-osmolality and hyponatremia: Secondary | ICD-10-CM | POA: Diagnosis not present

## 2020-07-22 DIAGNOSIS — J441 Chronic obstructive pulmonary disease with (acute) exacerbation: Secondary | ICD-10-CM | POA: Diagnosis not present

## 2020-07-22 LAB — CULTURE, BLOOD (ROUTINE X 2)
Culture: NO GROWTH
Culture: NO GROWTH
Special Requests: ADEQUATE

## 2020-07-22 LAB — BASIC METABOLIC PANEL
Anion gap: 10 (ref 5–15)
BUN: 12 mg/dL (ref 8–23)
CO2: 24 mmol/L (ref 22–32)
Calcium: 8.6 mg/dL — ABNORMAL LOW (ref 8.9–10.3)
Chloride: 89 mmol/L — ABNORMAL LOW (ref 98–111)
Creatinine, Ser: 0.57 mg/dL — ABNORMAL LOW (ref 0.61–1.24)
GFR, Estimated: >60 mL/min (ref >60)
Glucose, Bld: 198 mg/dL — ABNORMAL HIGH (ref 70–99)
Potassium: 4.1 mmol/L (ref 3.5–5.1)
Sodium: 123 mmol/L — ABNORMAL LOW (ref 135–145)

## 2020-07-22 MED ORDER — CEFDINIR 300 MG PO CAPS
300.0000 mg | ORAL_CAPSULE | Freq: Two times a day (BID) | ORAL | Status: AC
  Administered 2020-07-22 – 2020-07-23 (×4): 300 mg via ORAL
  Filled 2020-07-22 (×5): qty 1

## 2020-07-22 MED ORDER — ALBUTEROL SULFATE (2.5 MG/3ML) 0.083% IN NEBU
2.5000 mg | INHALATION_SOLUTION | Freq: Three times a day (TID) | RESPIRATORY_TRACT | Status: DC
  Administered 2020-07-22 – 2020-07-27 (×13): 2.5 mg via RESPIRATORY_TRACT
  Filled 2020-07-22 (×12): qty 3

## 2020-07-22 NOTE — Progress Notes (Signed)
Patient ID: Martin Bean, male   DOB: 1930/05/16, 86 y.o.   MRN: [id number] [facility] PROGRESS NOTE  RODDY BELLAMY UXN:[id number] DOB: [birth year] DOA: 07/17/2020 PCP: Gracelyn Nurse, MD  HPI/Subjective: Patient still with poor appetite. Feeling very weak. Still with some shortness of breath. Sodium still low but trending a little bit better today up to 123. Admitted with acute hypoxic respiratory failure and pneumonia.  Objective: Vitals:   07/22/20 0814 07/22/20 1142  BP: (!) 176/90 (!) 182/99  Pulse: 99 83  Resp: 16 16  Temp: (!) 97.5 F (36.4 C) (!) 97.5 F (36.4 C)  SpO2: 92% 95%   No intake or output data in the 24 hours ending 07/22/20 1418 Filed Weights   07/20/20 0413 07/21/20 0357 07/22/20 0413  Weight: 67.3 kg 64.5 kg 67.8 kg    ROS: Review of Systems  Respiratory: Negative for cough and shortness of breath.   Cardiovascular: Negative for chest pain.  Gastrointestinal: Negative for abdominal pain, nausea and vomiting.   Exam: Physical Exam HENT:     Head: Normocephalic.     Mouth/Throat:     Pharynx: No oropharyngeal exudate.  Eyes:     General: Lids are normal.     Conjunctiva/sclera: Conjunctivae normal.     Pupils: Pupils are equal, round, and reactive to light.  Cardiovascular:     Rate and Rhythm: Normal rate and regular rhythm.     Heart sounds: Normal heart sounds, S1 normal and S2 normal.  Pulmonary:     Breath sounds: Examination of the right-lower field reveals decreased breath sounds and rhonchi. Examination of the left-lower field reveals decreased breath sounds and rhonchi. Decreased breath sounds and rhonchi present. No wheezing or rales.  Abdominal:     Palpations: Abdomen is soft.     Tenderness: There is no abdominal tenderness.  Musculoskeletal:     Right ankle: No swelling.     Left ankle: No swelling.  Skin:    General: Skin is warm.     Findings: No rash.  Neurological:     Mental Status: He is alert and oriented to  person, place, and time.       Data Reviewed: Basic Metabolic Panel: Recent Labs  Lab 07/19/20 1711 07/19/20 1711 07/19/20 2009 07/20/20 0117 07/20/20 0847 07/21/20 0620 07/22/20 0731  NA 120*   < > 120* 122* 120* 121* 123*  K 3.9  --   --  3.8 3.7 3.9 4.1  CL 86*  --   --  88* 87* 88* 89*  CO2 24  --   --  25 23 23 24   GLUCOSE 138*  --   --  146* 140* 156* 198*  BUN 16  --   --  15 13 14 12   CREATININE 0.66  --   --  0.68 0.67 0.54* 0.57*  CALCIUM 8.2*  --   --  8.2* 8.4* 8.6* 8.6*   < > = values in this interval not displayed.   CBC: Recent Labs  Lab 07/17/20 1717 07/19/20 0607 07/20/20 0117  WBC 8.8 19.7* 17.4*  NEUTROABS  --  17.6* 15.2*  HGB 11.0* 10.1* 9.6*  HCT 31.7* 28.1* 27.2*  MCV 87.8 85.4 86.1  PLT 255 259 239   BNP (last 3 results) Recent Labs    07/17/20 1717  BNP 202.0*     Recent Results (from the past 240 hour(s))  Resp Panel by RT-PCR (Flu A&B, Covid) Nasopharyngeal Swab     Status:  None   Collection Time: 07/17/20  6:26 PM   Specimen: Nasopharyngeal Swab; Nasopharyngeal(NP) swabs in vial transport medium  Result Value Ref Range Status   SARS Coronavirus 2 by RT PCR NEGATIVE NEGATIVE Final    Comment: (NOTE) SARS-CoV-2 target nucleic acids are NOT DETECTED.  The SARS-CoV-2 RNA is generally detectable in upper respiratory specimens during the acute phase of infection. The lowest concentration of SARS-CoV-2 viral copies this assay can detect is 138 copies/mL. A negative result does not preclude SARS-Cov-2 infection and should not be used as the sole basis for treatment or other patient management decisions. A negative result may occur with  improper specimen collection/handling, submission of specimen other than nasopharyngeal swab, presence of viral mutation(s) within the areas targeted by this assay, and inadequate number of viral copies(<138 copies/mL). A negative result must be combined with clinical observations, patient history,  and epidemiological information. The expected result is Negative.  Fact Sheet for Patients:  BloggerCourse.com  Fact Sheet for Healthcare Providers:  SeriousBroker.it  This test is no t yet approved or cleared by the [place] FDA and  has been authorized for detection and/or diagnosis of SARS-CoV-2 by FDA under an Emergency Use Authorization (EUA). This EUA will remain  in effect (meaning this test can be used) for the duration of the COVID-19 declaration under Section 564(b)(1) of the Act, 21 U.S.C.section 360bbb-3(b)(1), unless the authorization is terminated  or revoked sooner.       Influenza A by PCR NEGATIVE NEGATIVE Final   Influenza B by PCR NEGATIVE NEGATIVE Final    Comment: (NOTE) The Xpert Xpress SARS-CoV-2/FLU/RSV plus assay is intended as an aid in the diagnosis of influenza from Nasopharyngeal swab specimens and should not be used as a sole basis for treatment. Nasal washings and aspirates are unacceptable for Xpert Xpress SARS-CoV-2/FLU/RSV testing.  Fact Sheet for Patients: BloggerCourse.com  Fact Sheet for Healthcare Providers: SeriousBroker.it  This test is not yet approved or cleared by the [place] FDA and has been authorized for detection and/or diagnosis of SARS-CoV-2 by FDA under an Emergency Use Authorization (EUA). This EUA will remain in effect (meaning this test can be used) for the duration of the COVID-19 declaration under Section 564(b)(1) of the Act, 21 U.S.C. section 360bbb-3(b)(1), unless the authorization is terminated or revoked.  Performed at [facility], [address] [place], [place] [address]   Blood culture (routine x 2)     Status: None   Collection Time: 07/17/20  7:52 PM   Specimen: BLOOD  Result Value Ref Range Status   Specimen Description BLOOD RIGHT ANTECUBITAL  Final   Special Requests   Final     BOTTLES DRAWN AEROBIC AND ANAEROBIC Blood Culture results may not be optimal due to an excessive volume of blood received in culture bottles   Culture   Final    NO GROWTH 5 DAYS Performed at [facility], [address], [place], [place] [address]    Report Status 07/22/2020 FINAL  Final  Blood culture (routine x 2)     Status: None   Collection Time: 07/17/20  7:52 PM   Specimen: BLOOD  Result Value Ref Range Status   Specimen Description BLOOD BLOOD LEFT FOREARM  Final   Special Requests   Final    BOTTLES DRAWN AEROBIC AND ANAEROBIC Blood Culture adequate volume   Culture   Final    NO GROWTH 5 DAYS Performed at [facility], [address], [place], [place]  [id number]    Report Status 07/22/2020 FINAL  Final      Scheduled Meds: . albuterol  2.5 mg Nebulization Q6H  . amLODipine  2.5 mg Oral Daily  . aspirin EC  81 mg Oral Daily  . atorvastatin  40 mg Oral q1800  . enoxaparin (LOVENOX) injection  40 mg Subcutaneous Q24H  . fluticasone furoate-vilanterol  1 puff Inhalation Daily   And  . umeclidinium bromide  1 puff Inhalation Daily  . furosemide  20 mg Oral Daily  . guaiFENesin  600 mg Oral Daily  . methylPREDNISolone (SOLU-MEDROL) injection  40 mg Intravenous Daily  . pantoprazole  40 mg Oral Daily  . sodium chloride flush  3 mL Intravenous Q12H  . sodium chloride  1 g Oral TID WC  . tamsulosin  0.4 mg Oral Daily   Continuous Infusions: . sodium chloride      Assessment/Plan:  1. COPD with exacerbation. Continue Solu-Medrol. Continue nebulizer treatments. Lungs sounding better today than the last 2 days. 2. Multifocal pneumonia seen on CT scan. Finished course of Zithromax. [facility] 3. Severe hyponatremia. Sodium up at 123. Continue salt tablets 3 times daily and low-dose Lasix. 4. Acute hypoxic respiratory failure secondary to COPD exacerbation. Patient did have a pulse ox of 89% on 1 L of oxygen which is  less than 90%. Taper oxygen if able to do so. 5. Weakness. Physical therapy recommending rehab. As per TOC has not had a bed offer but still waiting back to hear from others. They will touch base with the assisted living facility to see if he is able to go back there. 6. Essential hypertension on low-dose Norvasc 7. Hyperlipidemia unspecified on atorvastatin 8. GERD on omeprazole 9. BPH on Flomax 10. Palliative care consultation        Code Status:     Code Status Orders  (From admission, onward)         Start     Ordered   07/18/20 1537  Do not attempt resuscitation (DNR)  Continuous       Question Answer Comment  In the event of cardiac or respiratory ARREST Do not call a "code blue"   In the event of cardiac or respiratory ARREST Do not perform Intubation, CPR, defibrillation or ACLS   In the event of cardiac or respiratory ARREST Use medication by any route, position, wound care, and other measures to relive pain and suffering. May use oxygen, suction and manual treatment of airway obstruction as needed for comfort.      07/18/20 1536        Code Status History    Date Active Date Inactive Code Status Order ID Comments User Context   07/17/2020 1958 07/18/2020 1536 Full Code [id number]  Briscoe Deutscherpyd, Timothy S, MD ED   01/17/2019 1644 01/20/2019 2103 Full Code [id number]  Jama Flavorsjie, Jude, MD ED   12/28/2018 1840 12/30/2018 1913 Full Code [id number]  Altamese DillingVachhani, Vaibhavkumar, MD ED   11/01/2017 1225 11/06/2017 1702 Full Code [id number]  Ihor AustinPyreddy, Pavan, MD Inpatient   10/02/2017 1525 10/03/2017 1756 Full Code [id number]  Annice Needyew, Jason S, MD Inpatient   08/14/2017 0935 08/15/2017 2214 Full Code [id number]  Milagros LollSudini, Srikar, MD ED   Advance Care Planning Activity    Advance Directive Documentation     Most Recent Value  Type of Advance Directive Living will  Pre-existing out of facility DNR order (yellow form or pink MOST form) --  "MOST" Form in Place? --  Family Communication: Spoke with family at  the bedside Disposition Plan: Status is: Inpatient  Dispo: The patient is from: Assisted living facility              Anticipated d/c is to: Rehab versus assisted living facility              Anticipated d/c date is: Likely in another day or so. If rehab it may be Monday.              Patient currently still having wheeze and low sodium. Not ready for disposition today.  Consultants:  Nephrology  Palliative care  Time spent: 28 minutes  Brandon Scarbrough [employer]

## 2020-07-22 NOTE — Progress Notes (Signed)
Physical Therapy Treatment Patient Details Name: Martin Bean MRN: [id number] DOB: 1930/07/20 Today's Date: 07/22/2020    History of Present Illness 86 y.o. male with medical history significant for history of CVA, hypertension, chronic diastolic CHF, and depression, now presenting to the emergency department for evaluation of shortness of breath, cough, nausea, and general malaise.  Patient is accompanied by his daughter who assists with the history.  Patient has seemed to have some general weakness and fatigue for the past 1 to 2 weeks per report of his daughter.  Patient reports that he has developed some nausea, shortness of breath, and productive cough over the past couple days.  He denies any fevers, chills, chest pain, or palpitations.  He has slept in a recliner for years and is unable to comment on orthopnea.  He reports some bilateral lower leg swelling that he describes as chronic.  He had not eaten much today, but denies any vomiting, diarrhea, or loss of appetite until today.  Patient denies any headache and his daughter has not noticed any confusion. He denies difficulty swallowing or choking or coughing when trying to eat or drink.    PT Comments    Pt seen with daughter present for session. Pt agreeable to tx but does require encouragement throughout & at end of session to remain sitting in recliner.  Pt performs sit<>stand with CGA<>min assist with RW & PT provides total assist for peri hygiene 2/2 incontinent BM. Pt notes fatigue after standing but agreeable to ambulating & does so x 12 ft with RW & min assist with min assist/cuing for RW management. Pt returns to sitting in recliner & provided seated rest break. PT educates pt on pursed lip breathing. Pt on 1 L/min supplemental oxygen via nasal cannula & after gait SpO2 = 88% but increases to >/= 90% within 30-45 seconds, HR = 81 bpm. Pt performs BLE LAQ & hip adduction pillow squeezes for strengthening, all 1 set x 10 reps. Pt would  benefit from ongoing acute PT services to progress gait with LRAD, for strengthening & endurance training, and balance retraining.    Follow Up Recommendations  SNF;Supervision/Assistance - 24 hour     Equipment Recommendations   (TBD in next venue)    Recommendations for Other Services       Precautions / Restrictions Precautions Precautions: Fall Restrictions Weight Bearing Restrictions: No    Mobility  Bed Mobility                  Transfers Overall transfer level: Needs assistance   Transfers: Sit to/from Stand Sit to Stand: Min guard;Min assist         General transfer comment: sit<>stand from recliner x 2 with cuing to scoot out to edge of seat before sit>stand  Ambulation/Gait Ambulation/Gait assistance: Min assist Gait Distance (Feet): 12 Feet Assistive device: Rolling walker (2 wheeled) Gait Pattern/deviations: Decreased step length - right;Decreased step length - left;Decreased stride length Gait velocity: decreased   General Gait Details: forward lean on RW   Stairs             Wheelchair Mobility    Modified Rankin (Stroke Patients Only)       Balance Overall balance assessment: Needs assistance           Standing balance-Leahy Scale: Fair Standing balance comment: BUE support on RW during static standing with close supervision<>CGA  Cognition Arousal/Alertness: Awake/alert Behavior During Therapy: WFL for tasks assessed/performed Overall Cognitive Status: Within Functional Limits for tasks assessed                                 General Comments: hard of hearing      Exercises      General Comments General comments (skin integrity, edema, etc.): kyphotic      Pertinent Vitals/Pain Pain Assessment: Faces Faces Pain Scale: Hurts a little bit Pain Location: L thigh Pain Descriptors / Indicators: Aching;Sore Pain Intervention(s): Monitored during  session;Limited activity within patient's tolerance    Home Living                      Prior Function            PT Goals (current goals can now be found in the care plan section) Acute Rehab PT Goals Patient Stated Goal: to get better, eventually return to [facility] PT Goal Formulation: With patient/family Time For Goal Achievement: 08/02/20 Potential to Achieve Goals: Fair Progress towards PT goals: Progressing toward goals    Frequency    Min 2X/week      PT Plan Current plan remains appropriate    Co-evaluation              AM-PAC PT "6 Clicks" Mobility   Outcome Measure  Help needed turning from your back to your side while in a flat bed without using bedrails?: A Little Help needed moving from lying on your back to sitting on the side of a flat bed without using bedrails?: A Little Help needed moving to and from a bed to a chair (including a wheelchair)?: A Little Help needed standing up from a chair using your arms (e.g., wheelchair or bedside chair)?: A Little Help needed to walk in hospital room?: A Little Help needed climbing 3-5 steps with a railing? : A Lot 6 Click Score: 17    End of Session Equipment Utilized During Treatment: Gait belt;Oxygen Activity Tolerance: Patient tolerated treatment well;Patient limited by fatigue Patient left: in chair;with call bell/phone within reach;with chair alarm set;with family/visitor present Nurse Communication:  (need for additional peri hygiene) PT Visit Diagnosis: Unsteadiness on feet (R26.81);Other abnormalities of gait and mobility (R26.89);Muscle weakness (generalized) (M62.81);History of falling (Z91.81);Difficulty in walking, not elsewhere classified (R26.2)     Time: 5284-1324 PT Time Calculation (min) (ACUTE ONLY): 26 min  Charges:  $Therapeutic Activity: 23-37 mins                     Aleda Grana, PT, DPT 07/22/20, 4:32 PM    Sandi Mariscal 07/22/2020, 4:29 PM

## 2020-07-22 NOTE — Care Management Important Message (Signed)
Important Message  Patient Details  Name: MARICUS TANZI MRN: [id number] Date of Birth: 06/05/1930   Medicare Important Message Given:  Yes     [name] 07/22/2020, 1:05 PM

## 2020-07-22 NOTE — Progress Notes (Signed)
Mobility Specialist - Progress Note   07/22/20 1422  Mobility  Activity Dangled on edge of bed;Transferred:  Bed to chair (bed-level exercises)  Level of Assistance Minimal assist, patient does 75% or more  Assistive Device Front wheel walker  Distance Ambulated (ft) 3 ft  Mobility Response Tolerated fair  Mobility performed by Mobility specialist  $Mobility charge 1 Mobility    Pt laying in bed w/ daughter present in room upon arrival. Pt very HOH. Pt initially only agreeable to bed-level exercises. States he has pain in LLE and isn't able to get out of bed. Pt performed: ankle pumps x 10, heel slides x 5, and slr x 5. Pt states he is motivated to do more, however, required heavy encouragement to follow commands. When given a command, pt states "I can't do it". Pt needed mod. Assist getting to EOB for LE support. Pt dangled EOB for a couple of minutes. Pt S2S to RW w/ min. Assist for boosting and bed elevated. Pt transferred from bed to chair w/ min. Assist. Noted pt had BM. Mobility specialist assisted w/ toilet hygiene. Pt motivated to ambulate more. Deferred further ambulation for safety d/t pt having unsteady gait during transfer. Suggested pt perform standing marches 10x instead. Pt performed standing marches x 10 before sitting onto recliner. Overall, pt tolerated session fair. Pt required extra time d/t pt needing heavy encouragement. Nurse entered room at the end of session and was notified. Pt left sitting on recliner w/ alarm set and nurse and daughter present in room.     Martin Bean Mobility Specialist  07/22/20, 2:31 PM

## 2020-07-22 NOTE — Plan of Care (Addendum)
Consult noted for GOC. Arrived to see patient, however patient has just began working with PT. Will re-attempt Monday when palliative returns.

## 2020-07-22 NOTE — Progress Notes (Signed)
[address] [place], [place] [address] Phone [phone]. Fax [phone]  Date: 07/22/2020                  Patient Name:  Martin Bean  MRN: [id number]  DOB: [birth year]  Age / Sex: 86 y.o., male         PCP: Gracelyn Nurse, MD                 Service Requesting Consult: IM/ Alford Highland, MD                 Reason for Consult: Hyponatremia            History of Present Illness: Patient is a 86 y.o. male admitted to [facility] on 07/17/2020  Information obtained mostly from the chart and daughter. Patient has multiple chronic medical issues including history of stroke, hypertension, chronic diastolic CHF, depression, presented to the ER for shortness of breath, cough, nausea and generalized malaise.  Symptoms of general weakness and fatigue 1 to 2 weeks prior to admission.  Nausea, shortness of breath and productive cough 1 to 2 days prior to admission.    In the ER, patient required 2 L oxygen supplementation, was found to be tachypneic and high blood pressure.  Chest x-ray concerning for pneumonia.  Sodium was noted to be low at 122.  Patient was admitted for further evaluation and management and nephrology consult requested for evaluation of hyponatremia  His daughter is at bedside today. He is very hard of hearing. They state that overall he is feeling a little better. Able to eat some although his appetite is very poor. His daughter reports that he has been getting weak over the past 2 to 3 months. Eating very little in the assisted living facility. Today's lab indicates slight improvement in sodium to 123.    Vital Signs: Blood pressure (!) 182/99, pulse 83, temperature (!) 97.5 F (36.4 C), temperature source Oral, resp. rate 16, height 5\' 9"  (1.753 m), weight 67.8 kg, SpO2 95 %.  No intake or output data in the 24 hours ending 07/22/20 1348  Weight trends: Filed Weights   07/20/20 0413 07/21/20 0357 07/22/20 0413  Weight: 67.3 kg 64.5 kg 67.8 kg    Physical Exam: General:  No acute distress, laying in the bed, thin, cachectic  HEENT  anicteric, moist oral mucous membrane  Pulm/lungs  2 L oxygen, bilateral diffuse coarse crackles  CVS/Heart  regular rhythm, no rub or gallop  Abdomen:   Soft, nontender  Extremities:  Trace peripheral edema  Neurologic:  Alert, oriented, able to follow commands  Skin:  No acute rashes  External catheter    Lab results: Basic Metabolic Panel: Recent Labs  Lab 07/20/20 0847 07/21/20 0620 07/22/20 0731  NA 120* 121* 123*  K 3.7 3.9 4.1  CL 87* 88* 89*  CO2 23 23 24   GLUCOSE 140* 156* 198*  BUN 13 14 12   CREATININE 0.67 0.54* 0.57*  CALCIUM 8.4* 8.6* 8.6*    Liver Function Tests: No results for input(s): AST, ALT, ALKPHOS, BILITOT, PROT, ALBUMIN in the last 168 hours. No results for input(s): LIPASE, AMYLASE in the last 168 hours. No results for input(s): AMMONIA in the last 168 hours.  CBC: Recent Labs  Lab 07/19/20 0607 07/20/20 0117  WBC 19.7* 17.4*  NEUTROABS 17.6* 15.2*  HGB 10.1* 9.6*  HCT 28.1* 27.2*  MCV 85.4 86.1  PLT 259 239    Cardiac Enzymes: No results for  input(s): CKTOTAL, TROPONINI in the last 168 hours.  BNP: Invalid input(s): POCBNP  CBG: No results for input(s): GLUCAP in the last 168 hours.  Microbiology: Recent Results (from the past 720 hour(s))  Resp Panel by RT-PCR (Flu A&B, Covid) Nasopharyngeal Swab     Status: None   Collection Time: 07/17/20  6:26 PM   Specimen: Nasopharyngeal Swab; Nasopharyngeal(NP) swabs in vial transport medium  Result Value Ref Range Status   SARS Coronavirus 2 by RT PCR NEGATIVE NEGATIVE Final    Comment: (NOTE) SARS-CoV-2 target nucleic acids are NOT DETECTED.  The SARS-CoV-2 RNA is generally detectable in upper respiratory specimens during the acute phase of infection. The lowest concentration of SARS-CoV-2 viral copies this assay can detect is 138 copies/mL. A negative result does not preclude  SARS-Cov-2 infection and should not be used as the sole basis for treatment or other patient management decisions. A negative result may occur with  improper specimen collection/handling, submission of specimen other than nasopharyngeal swab, presence of viral mutation(s) within the areas targeted by this assay, and inadequate number of viral copies(<138 copies/mL). A negative result must be combined with clinical observations, patient history, and epidemiological information. The expected result is Negative.  Fact Sheet for Patients:  BloggerCourse.com  Fact Sheet for Healthcare Providers:  SeriousBroker.it  This test is no t yet approved or cleared by the [place] FDA and  has been authorized for detection and/or diagnosis of SARS-CoV-2 by FDA under an Emergency Use Authorization (EUA). This EUA will remain  in effect (meaning this test can be used) for the duration of the COVID-19 declaration under Section 564(b)(1) of the Act, 21 U.S.C.section 360bbb-3(b)(1), unless the authorization is terminated  or revoked sooner.       Influenza A by PCR NEGATIVE NEGATIVE Final   Influenza B by PCR NEGATIVE NEGATIVE Final    Comment: (NOTE) The Xpert Xpress SARS-CoV-2/FLU/RSV plus assay is intended as an aid in the diagnosis of influenza from Nasopharyngeal swab specimens and should not be used as a sole basis for treatment. Nasal washings and aspirates are unacceptable for Xpert Xpress SARS-CoV-2/FLU/RSV testing.  Fact Sheet for Patients: BloggerCourse.com  Fact Sheet for Healthcare Providers: SeriousBroker.it  This test is not yet approved or cleared by the [place] FDA and has been authorized for detection and/or diagnosis of SARS-CoV-2 by FDA under an Emergency Use Authorization (EUA). This EUA will remain in effect (meaning this test can be used) for the duration of  the COVID-19 declaration under Section 564(b)(1) of the Act, 21 U.S.C. section 360bbb-3(b)(1), unless the authorization is terminated or revoked.  Performed at [facility], [address] [place], [place] [address]   Blood culture (routine x 2)     Status: None   Collection Time: 07/17/20  7:52 PM   Specimen: BLOOD  Result Value Ref Range Status   Specimen Description BLOOD RIGHT ANTECUBITAL  Final   Special Requests   Final    BOTTLES DRAWN AEROBIC AND ANAEROBIC Blood Culture results may not be optimal due to an excessive volume of blood received in culture bottles   Culture   Final    NO GROWTH 5 DAYS Performed at [facility], [address] [place], [place] [address]    Report Status 07/22/2020 FINAL  Final  Blood culture (routine x 2)     Status: None   Collection Time: 07/17/20  7:52 PM   Specimen: BLOOD  Result Value Ref Range Status   Specimen Description  BLOOD BLOOD LEFT FOREARM  Final   Special Requests   Final    BOTTLES DRAWN AEROBIC AND ANAEROBIC Blood Culture adequate volume   Culture   Final    NO GROWTH 5 DAYS Performed at [facility], [address] [place], [place] [address]    Report Status 07/22/2020 FINAL  Final     Coagulation Studies: No results for input(s): LABPROT, INR in the last 72 hours.  Urinalysis: No results for input(s): COLORURINE, LABSPEC, PHURINE, GLUCOSEU, HGBUR, BILIRUBINUR, KETONESUR, PROTEINUR, UROBILINOGEN, NITRITE, LEUKOCYTESUR in the last 72 hours.  Invalid input(s): APPERANCEUR    Scheduled Meds: . albuterol  2.5 mg Nebulization Q6H  . amLODipine  2.5 mg Oral Daily  . aspirin EC  81 mg Oral Daily  . atorvastatin  40 mg Oral q1800  . enoxaparin (LOVENOX) injection  40 mg Subcutaneous Q24H  . fluticasone furoate-vilanterol  1 puff Inhalation Daily   And  . umeclidinium bromide  1 puff Inhalation Daily  . furosemide  20 mg Oral Daily  . guaiFENesin  600 mg Oral Daily  . methylPREDNISolone  (SOLU-MEDROL) injection  40 mg Intravenous Daily  . pantoprazole  40 mg Oral Daily  . sodium chloride flush  3 mL Intravenous Q12H  . sodium chloride  1 g Oral TID WC  . tamsulosin  0.4 mg Oral Daily   Continuous Infusions: . sodium chloride     PRN Meds:.sodium chloride, acetaminophen, guaiFENesin, labetalol, ondansetron (ZOFRAN) IV, sodium chloride flush   Imaging: No results found.   Assessment & Plan: Pt is a 86 y.o. Caucasian   male with history of stroke, hypertension, chronic diastolic CHF, depression, emphysema aortic atherosclerosis, coronary calcifications was admitted on 07/17/2020 with Shortness of breath [R06.02] Hyponatremia [E87.1] Primary hypertension [I10] Hypoxia [R09.02] Other congestive heart failure (HCC) [I50.9] Acute on chronic diastolic CHF (congestive heart failure) (HCC) [I50.33]   #Hyponatremia Patient with underlying pneumonia/lung disease/emphysema Urine osmolality 430 Home medication list does not have thiazide diuretics Previously known sodium of 137 on January 20, 2019 TSH normal 06/2020  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  Added low-dose Lasix.  If this combination does not work, will consider tolvaptan    # Pneumonia with underlying COPD  treatment with iv rocephin and axithromycin as per hospitalist team    LOS: 5 Deagan Sevin 12/3/20211:48 PM    Note: This note was prepared with Dragon dictation. Any transcription errors are unintentional

## 2020-07-23 ENCOUNTER — Inpatient Hospital Stay: Payer: Medicare HMO

## 2020-07-23 DIAGNOSIS — I10 Essential (primary) hypertension: Secondary | ICD-10-CM | POA: Diagnosis not present

## 2020-07-23 DIAGNOSIS — M79606 Pain in leg, unspecified: Secondary | ICD-10-CM

## 2020-07-23 DIAGNOSIS — J441 Chronic obstructive pulmonary disease with (acute) exacerbation: Secondary | ICD-10-CM | POA: Diagnosis not present

## 2020-07-23 DIAGNOSIS — M79605 Pain in left leg: Secondary | ICD-10-CM

## 2020-07-23 DIAGNOSIS — E871 Hypo-osmolality and hyponatremia: Secondary | ICD-10-CM | POA: Diagnosis not present

## 2020-07-23 DIAGNOSIS — J189 Pneumonia, unspecified organism: Secondary | ICD-10-CM | POA: Diagnosis not present

## 2020-07-23 LAB — CBC
HCT: 29.4 % — ABNORMAL LOW (ref 39.0–52.0)
Hemoglobin: 10.4 g/dL — ABNORMAL LOW (ref 13.0–17.0)
MCH: 30.4 pg (ref 26.0–34.0)
MCHC: 35.4 g/dL (ref 30.0–36.0)
MCV: 86 fL (ref 80.0–100.0)
Platelets: 322 10*3/uL (ref 150–400)
RBC: 3.42 MIL/uL — ABNORMAL LOW (ref 4.22–5.81)
RDW: 13.2 % (ref 11.5–15.5)
WBC: 13.1 10*3/uL — ABNORMAL HIGH (ref 4.0–10.5)
nRBC: 0 % (ref 0.0–0.2)

## 2020-07-23 LAB — BASIC METABOLIC PANEL
Anion gap: 10 (ref 5–15)
Anion gap: 10 (ref 5–15)
BUN: 21 mg/dL (ref 8–23)
BUN: 20 mg/dL (ref 8–23)
CO2: 25 mmol/L (ref 22–32)
CO2: 25 mmol/L (ref 22–32)
Calcium: 8.7 mg/dL — ABNORMAL LOW (ref 8.9–10.3)
Calcium: 8.7 mg/dL — ABNORMAL LOW (ref 8.9–10.3)
Chloride: 88 mmol/L — ABNORMAL LOW (ref 98–111)
Chloride: 89 mmol/L — ABNORMAL LOW (ref 98–111)
Creatinine, Ser: 0.6 mg/dL — ABNORMAL LOW (ref 0.61–1.24)
Creatinine, Ser: 0.62 mg/dL (ref 0.61–1.24)
GFR, Estimated: >60 mL/min (ref >60)
GFR, Estimated: >60 mL/min (ref >60)
Glucose, Bld: 161 mg/dL — ABNORMAL HIGH (ref 70–99)
Glucose, Bld: 156 mg/dL — ABNORMAL HIGH (ref 70–99)
Potassium: 3.9 mmol/L (ref 3.5–5.1)
Potassium: 4 mmol/L (ref 3.5–5.1)
Sodium: 123 mmol/L — ABNORMAL LOW (ref 135–145)
Sodium: 124 mmol/L — ABNORMAL LOW (ref 135–145)

## 2020-07-23 LAB — HEPATIC FUNCTION PANEL
ALT: 58 U/L — ABNORMAL HIGH (ref 0–44)
AST: 40 U/L (ref 15–41)
Albumin: 2.9 g/dL — ABNORMAL LOW (ref 3.5–5.0)
Alkaline Phosphatase: 72 U/L (ref 38–126)
Bilirubin, Direct: <0.1 mg/dL (ref 0.0–0.2)
Total Bilirubin: 0.7 mg/dL (ref 0.3–1.2)
Total Protein: 6.2 g/dL — ABNORMAL LOW (ref 6.5–8.1)

## 2020-07-23 LAB — CK: Total CK: 32 U/L — ABNORMAL LOW (ref 49–397)

## 2020-07-23 LAB — SODIUM: Sodium: 130 mmol/L — ABNORMAL LOW (ref 135–145)

## 2020-07-23 MED ORDER — TOLVAPTAN 15 MG PO TABS
15.0000 mg | ORAL_TABLET | Freq: Once | ORAL | Status: AC
  Administered 2020-07-23: 15 mg via ORAL
  Filled 2020-07-23: qty 1

## 2020-07-23 MED ORDER — OXYCODONE HCL 5 MG PO TABS
2.5000 mg | ORAL_TABLET | Freq: Four times a day (QID) | ORAL | Status: DC | PRN
  Administered 2020-07-23 – 2020-07-29 (×8): 2.5 mg via ORAL
  Filled 2020-07-23 (×10): qty 1

## 2020-07-23 NOTE — Progress Notes (Signed)
[facility]  ROUNDING NOTE   Subjective:   [name] is 86 years old gentleman admitted to [facility] on 07/17/2020.  Patient has multiple chronic medical conditions including history of stroke, hypertension, chronic diastolic congestive heart failure, and depression.  Patient presented to the emergency department with shortness of breath, cough, nausea, and generalized malaise.  Chest x-ray concerning for pneumonia.  Nephrology was consulted for the management of hyponatremia, initial sodium level was 122.  Patient found sitting up in bed, consuming breakfast.  Denies worsening shortness of breath, nausea, and vomiting.   Objective:  Vital signs in last 24 hours:  Temp:  [97.5 F (36.4 C)-97.7 F (36.5 C)] 97.7 F (36.5 C) (12/04 0750) Pulse Rate:  [74-86] 74 (12/04 1351) Resp:  [18-24] 18 (12/04 1351) BP: (129-189)/(64-97) 152/71 (12/04 1351) SpO2:  [93 %-98 %] 97 % (12/04 1351) Weight:  [69.9 kg] 69.9 kg (12/04 0514)  Weight change: 2.132 kg Filed Weights   07/21/20 0357 07/22/20 0413 07/23/20 0514  Weight: 64.5 kg 67.8 kg 69.9 kg    Intake/Output: I/O last 3 completed shifts: In: -  Out: 702 [Urine:701; Stool:1]   Intake/Output this shift:  Total I/O In: -  Out: 1000 [Urine:1000]  Physical Exam: General:  In no acute distress  Head: Normocephalic, atraumatic. Moist oral mucosal membranes  Eyes: Anicteric  Lungs:  Clear to auscultation bilaterally, respirations even, unlabored, requiring 2 L supplemental O2 via nasal cannula  Heart: Regular rate and rhythm  Abdomen:  Soft, nontender, nondistended  Extremities:  No peripheral edema.  Neurologic:  Awake, alert, answers simple questions appropriately, hard of hearing  Skin: No acute lesions or rashes    Basic Metabolic Panel: Recent Labs  Lab 07/20/20 0847 07/20/20 0847 07/21/20 0620 07/21/20 0620 07/22/20 0731 07/23/20 0617 07/23/20 0716  NA 120*  --  121*  --  123* 123* 124*  K 3.7  --  3.9  --   4.1 3.9 4.0  CL 87*  --  88*  --  89* 88* 89*  CO2 23  --  23  --  24 25 25   GLUCOSE 140*  --  156*  --  198* 161* 156*  BUN 13  --  14  --  12 21 20   CREATININE 0.67  --  0.54*  --  0.57* 0.60* 0.62  CALCIUM 8.4*   < > 8.6*   < > 8.6* 8.7* 8.7*   < > = values in this interval not displayed.    Liver Function Tests: Recent Labs  Lab 07/23/20 0716  AST 40  ALT 58*  ALKPHOS 72  BILITOT 0.7  PROT 6.2*  ALBUMIN 2.9*   No results for input(s): LIPASE, AMYLASE in the last 168 hours. No results for input(s): AMMONIA in the last 168 hours.  CBC: Recent Labs  Lab 07/17/20 1717 07/19/20 0607 07/20/20 0117 07/23/20 0612  WBC 8.8 19.7* 17.4* 13.1*  NEUTROABS  --  17.6* 15.2*  --   HGB 11.0* 10.1* 9.6* 10.4*  HCT 31.7* 28.1* 27.2* 29.4*  MCV 87.8 85.4 86.1 86.0  PLT 255 259 239 322    Cardiac Enzymes: Recent Labs  Lab 07/23/20 0716  CKTOTAL 32*    BNP: Invalid input(s): POCBNP  CBG: No results for input(s): GLUCAP in the last 168 hours.  Microbiology: Results for orders placed or performed during the hospital encounter of 07/17/20  Resp Panel by RT-PCR (Flu A&B, Covid) Nasopharyngeal Swab     Status: None   Collection  Time: 07/17/20  6:26 PM   Specimen: Nasopharyngeal Swab; Nasopharyngeal(NP) swabs in vial transport medium  Result Value Ref Range Status   SARS Coronavirus 2 by RT PCR NEGATIVE NEGATIVE Final    Comment: (NOTE) SARS-CoV-2 target nucleic acids are NOT DETECTED.  The SARS-CoV-2 RNA is generally detectable in upper respiratory specimens during the acute phase of infection. The lowest concentration of SARS-CoV-2 viral copies this assay can detect is 138 copies/mL. A negative result does not preclude SARS-Cov-2 infection and should not be used as the sole basis for treatment or other patient management decisions. A negative result may occur with  improper specimen collection/handling, submission of specimen other than nasopharyngeal swab, presence of  viral mutation(s) within the areas targeted by this assay, and inadequate number of viral copies(<138 copies/mL). A negative result must be combined with clinical observations, patient history, and epidemiological information. The expected result is Negative.  Fact Sheet for Patients:  BloggerCourse.com  Fact Sheet for Healthcare Providers:  SeriousBroker.it  This test is no t yet approved or cleared by the [place] FDA and  has been authorized for detection and/or diagnosis of SARS-CoV-2 by FDA under an Emergency Use Authorization (EUA). This EUA will remain  in effect (meaning this test can be used) for the duration of the COVID-19 declaration under Section 564(b)(1) of the Act, 21 U.S.C.section 360bbb-3(b)(1), unless the authorization is terminated  or revoked sooner.       Influenza A by PCR NEGATIVE NEGATIVE Final   Influenza B by PCR NEGATIVE NEGATIVE Final    Comment: (NOTE) The Xpert Xpress SARS-CoV-2/FLU/RSV plus assay is intended as an aid in the diagnosis of influenza from Nasopharyngeal swab specimens and should not be used as a sole basis for treatment. Nasal washings and aspirates are unacceptable for Xpert Xpress SARS-CoV-2/FLU/RSV testing.  Fact Sheet for Patients: BloggerCourse.com  Fact Sheet for Healthcare Providers: SeriousBroker.it  This test is not yet approved or cleared by the [place] FDA and has been authorized for detection and/or diagnosis of SARS-CoV-2 by FDA under an Emergency Use Authorization (EUA). This EUA will remain in effect (meaning this test can be used) for the duration of the COVID-19 declaration under Section 564(b)(1) of the Act, 21 U.S.C. section 360bbb-3(b)(1), unless the authorization is terminated or revoked.  Performed at [facility], [address] [place], [place] [address]   Blood culture (routine x  2)     Status: None   Collection Time: 07/17/20  7:52 PM   Specimen: BLOOD  Result Value Ref Range Status   Specimen Description BLOOD RIGHT ANTECUBITAL  Final   Special Requests   Final    BOTTLES DRAWN AEROBIC AND ANAEROBIC Blood Culture results may not be optimal due to an excessive volume of blood received in culture bottles   Culture   Final    NO GROWTH 5 DAYS Performed at [facility], [address], [place], [place] [address]    Report Status 07/22/2020 FINAL  Final  Blood culture (routine x 2)     Status: None   Collection Time: 07/17/20  7:52 PM   Specimen: BLOOD  Result Value Ref Range Status   Specimen Description BLOOD BLOOD LEFT FOREARM  Final   Special Requests   Final    BOTTLES DRAWN AEROBIC AND ANAEROBIC Blood Culture adequate volume   Culture   Final    NO GROWTH 5 DAYS Performed at [facility], [address], [place], [place] [address]  Report Status 07/22/2020 FINAL  Final    Coagulation Studies: No results for input(s): LABPROT, INR in the last 72 hours.  Urinalysis: No results for input(s): COLORURINE, LABSPEC, PHURINE, GLUCOSEU, HGBUR, BILIRUBINUR, KETONESUR, PROTEINUR, UROBILINOGEN, NITRITE, LEUKOCYTESUR in the last 72 hours.  Invalid input(s): APPERANCEUR    Imaging: US Venous Img Lower Unilateral Left (DVT)  Result Date: 07/23/2020 CLINICAL DATA:  Left lower extremity pain and edema. EXAM: LEFT LOWER EXTREMITY VENOUS DOPPLER ULTRASOUND TECHNIQUE: Gray-scale sonography with graded compression, as well as color Doppler and duplex ultrasound were performed to evaluate the lower extremity deep venous systems from the level of the common femoral vein and including the common femoral, femoral, profunda femoral, popliteal and calf veins including the posterior tibial, peroneal and gastrocnemius veins when visible. The superficial great saphenous vein was also interrogated. Spectral Doppler was utilized to evaluate flow at rest and  with distal augmentation maneuvers in the common femoral, femoral and popliteal veins. COMPARISON:  None. FINDINGS: Contralateral Common Femoral Vein: Respiratory phasicity is normal and symmetric with the symptomatic side. No evidence of thrombus. Normal compressibility. Common Femoral Vein: No evidence of thrombus. Normal compressibility, respiratory phasicity and response to augmentation. Saphenofemoral Junction: No evidence of thrombus. Normal compressibility and flow on color Doppler imaging. Profunda Femoral Vein: No evidence of thrombus. Normal compressibility and flow on color Doppler imaging. Femoral Vein: No evidence of thrombus. Normal compressibility, respiratory phasicity and response to augmentation. Popliteal Vein: No evidence of thrombus. Normal compressibility, respiratory phasicity and response to augmentation. Calf Veins: No evidence of thrombus. Normal compressibility and flow on color Doppler imaging. Superficial Great Saphenous Vein: No evidence of thrombus. Normal compressibility. Venous Reflux:  None. Other Findings: No evidence of superficial thrombophlebitis or abnormal fluid collection. IMPRESSION: No evidence of left lower extremity deep venous thrombosis. Electronically Signed   By: Irish Lack M.D.   On: 07/23/2020 09:09     Medications:   . sodium chloride     . albuterol  2.5 mg Nebulization TID  . amLODipine  2.5 mg Oral Daily  . aspirin EC  81 mg Oral Daily  . cefdinir  300 mg Oral Q12H  . enoxaparin (LOVENOX) injection  40 mg Subcutaneous Q24H  . fluticasone furoate-vilanterol  1 puff Inhalation Daily   And  . umeclidinium bromide  1 puff Inhalation Daily  . guaiFENesin  600 mg Oral Daily  . methylPREDNISolone (SOLU-MEDROL) injection  40 mg Intravenous Daily  . pantoprazole  40 mg Oral Daily  . sodium chloride flush  3 mL Intravenous Q12H  . sodium chloride  1 g Oral TID WC  . tamsulosin  0.4 mg Oral Daily   sodium chloride, acetaminophen, guaiFENesin,  labetalol, ondansetron (ZOFRAN) IV, oxyCODONE, sodium chloride flush  Assessment/ Plan:  Mr. TAVARIS EUDY is a 86 y.o.  male  admitted to [facility] on 07/17/2020.  Patient has multiple chronic medical conditions including history of stroke, hypertension, chronic diastolic congestive heart failure, and depression.  Patient presented to the emergency department with shortness of breath, cough, nausea, and generalized malaise.  Chest x-ray concerning for pneumonia.  Nephrology was consulted for the management of hyponatremia, initial sodium level was 122.  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 Patient with underlying pneumonia/lung disease/emphysema Urine osmolality was 430 No thiazide diuretics on home medication Serum sodium level on January 20, 2019 was 137 TSH was normal in November 2021  Patient is on oral sodium chloride Lasix low-dose started yesterday Sodium level stays at 123 again today We will  hold furosemide Plan to give one dose of tolvaptan today We will continue monitoring sodium levels  #Pneumonia #COPD exacerbation No acute respiratory distress noted today, requiring 2 L of supplemental O2 via nasal cannula Patient is on antibiotics, Solu-Medrol and bronchodilators Management per primary team  #Hypertension Blood pressure readings slightly above the goal Patient is on amlodipine   LOS: 6 Kahmari Herard 12/4/20212:44 PM

## 2020-07-23 NOTE — Progress Notes (Signed)
Pt is complaining of 8/10 cramping pain on left leg. Given tylenol at 0510. Notify NP Jon Billings. Will continue to monitor.

## 2020-07-23 NOTE — Plan of Care (Signed)
?  Problem: Clinical Measurements: ?Goal: Respiratory complications will improve ?Outcome: Progressing ?Goal: Cardiovascular complication will be avoided ?Outcome: Progressing ?  ?Problem: Safety: ?Goal: Ability to remain free from injury will improve ?Outcome: Progressing ?  ?

## 2020-07-23 NOTE — TOC Progression Note (Signed)
Transition of Care [facility]) - Progression Note    Patient Details  Name: Martin Bean MRN: [id number] Date of Birth: 08-13-1930  Transition [facility]) CM/SW Contact  Bing Quarry, RN Phone Number: 07/23/2020, 10:16 AM  Clinical Narrative:    Asked by supervisor(WE) to inquire as to whether this patient would be better served with Hospice Care at home if family is willing/able as patient conditions appears to be declining to the point it is unlikely SNF/Insurance will accept, though still pending authorization. Messaged provider and today's RN for thoughts on alternatives and how TOC can facilitate. Palliative attempted to see patient but was unable and will not attempt again until Monday. Will follow up with whatever provider input is and attempt to speak with family. Gabriel Cirri RN CM     Expected Discharge Plan: Skilled Nursing Facility Barriers to Discharge: Continued Medical Work up  Expected Discharge Plan and Services Expected Discharge Plan: Skilled Nursing Facility In-house Referral: Clinical Social Work   Post Acute Care Choice: Skilled Nursing Facility Living arrangements for the past 2 months: Assisted Living Facility [facility])                                       Social Determinants of Health (SDOH) Interventions    Readmission Risk Interventions Readmission Risk Prevention Plan 01/19/2019  Transportation Screening Complete  PCP or Specialist Appt within 5-7 Days Complete  Home Care Screening Complete  Medication Review (RN CM) Complete  Some recent data might be hidden

## 2020-07-23 NOTE — Progress Notes (Signed)
Patient ID: Martin Bean, male   DOB: [birth year], 86 y.o.   MRN: [id number] [facility] PROGRESS NOTE  ONIEL MELESKI DDU:[id number] DOB: March 04, 1930 DOA: 07/17/2020 PCP: Gracelyn Nurse, MD  HPI/Subjective: I called to see the patient early this morning with left leg pain.  Patient states that he worked with physical therapy and it is just very sore.  He is not able to move it as much and it was very painful for him this morning.  Still having some shortness of breath and some cough.  Still not eating very well.  Sodium this morning still low.  Objective: Vitals:   07/23/20 0750 07/23/20 1351  BP: (!) 177/89 (!) 152/71  Pulse: 74 74  Resp: 18 18  Temp: 97.7 F (36.5 C)   SpO2: 98% 97%    Intake/Output Summary (Last 24 hours) at 07/23/2020 1536 Last data filed at 07/23/2020 1405 Gross per 24 hour  Intake --  Output 1700 ml  Net -1700 ml   Filed Weights   07/21/20 0357 07/22/20 0413 07/23/20 0514  Weight: 64.5 kg 67.8 kg 69.9 kg    ROS: Review of Systems  Respiratory: Positive for cough and shortness of breath.   Cardiovascular: Negative for chest pain.  Gastrointestinal: Negative for abdominal pain, nausea and vomiting.  Musculoskeletal: Positive for myalgias.   Exam: Physical Exam HENT:     Head: Normocephalic.     Mouth/Throat:     Pharynx: No oropharyngeal exudate.  Eyes:     General: Lids are normal.     Conjunctiva/sclera: Conjunctivae normal.     Pupils: Pupils are equal, round, and reactive to light.  Cardiovascular:     Rate and Rhythm: Normal rate and regular rhythm.     Heart sounds: Normal heart sounds, S1 normal and S2 normal.  Pulmonary:     Breath sounds: Examination of the right-lower field reveals decreased breath sounds and rhonchi. Examination of the left-lower field reveals decreased breath sounds and rhonchi. Decreased breath sounds and rhonchi present. No rales.  Abdominal:     Palpations: Abdomen is soft.     Tenderness: There is no  abdominal tenderness.  Musculoskeletal:     Comments: Good range of motion left knee and left hip.  More pain to palpation over the musculature of the left thigh.  Skin:    General: Skin is warm.     Findings: No rash.  Neurological:     Mental Status: He is alert and oriented to person, place, and time.     Comments: Able to straight leg raise on the right easier than on the left.       Data Reviewed: Basic Metabolic Panel: Recent Labs  Lab 07/20/20 0847 07/21/20 0620 07/22/20 0731 07/23/20 0617 07/23/20 0716  NA 120* 121* 123* 123* 124*  K 3.7 3.9 4.1 3.9 4.0  CL 87* 88* 89* 88* 89*  CO2 23 23 24 25 25   GLUCOSE 140* 156* 198* 161* 156*  BUN 13 14 12 21 20   CREATININE 0.67 0.54* 0.57* 0.60* 0.62  CALCIUM 8.4* 8.6* 8.6* 8.7* 8.7*   Liver Function Tests: Recent Labs  Lab 07/23/20 0716  AST 40  ALT 58*  ALKPHOS 72  BILITOT 0.7  PROT 6.2*  ALBUMIN 2.9*   CBC: Recent Labs  Lab 07/17/20 1717 07/19/20 0607 07/20/20 0117 07/23/20 0612  WBC 8.8 19.7* 17.4* 13.1*  NEUTROABS  --  17.6* 15.2*  --   HGB 11.0* 10.1* 9.6* 10.4*  HCT 31.7*  28.1* 27.2* 29.4*  MCV 87.8 85.4 86.1 86.0  PLT 255 259 239 322   Cardiac Enzymes: Recent Labs  Lab 07/23/20 0716  CKTOTAL 32*    Studies: US Venous Img Lower Unilateral Left (DVT)  Result Date: 07/23/2020 CLINICAL DATA:  Left lower extremity pain and edema. EXAM: LEFT LOWER EXTREMITY VENOUS DOPPLER ULTRASOUND TECHNIQUE: Gray-scale sonography with graded compression, as well as color Doppler and duplex ultrasound were performed to evaluate the lower extremity deep venous systems from the level of the common femoral vein and including the common femoral, femoral, profunda femoral, popliteal and calf veins including the posterior tibial, peroneal and gastrocnemius veins when visible. The superficial great saphenous vein was also interrogated. Spectral Doppler was utilized to evaluate flow at rest and with distal augmentation  maneuvers in the common femoral, femoral and popliteal veins. COMPARISON:  None. FINDINGS: Contralateral Common Femoral Vein: Respiratory phasicity is normal and symmetric with the symptomatic side. No evidence of thrombus. Normal compressibility. Common Femoral Vein: No evidence of thrombus. Normal compressibility, respiratory phasicity and response to augmentation. Saphenofemoral Junction: No evidence of thrombus. Normal compressibility and flow on color Doppler imaging. Profunda Femoral Vein: No evidence of thrombus. Normal compressibility and flow on color Doppler imaging. Femoral Vein: No evidence of thrombus. Normal compressibility, respiratory phasicity and response to augmentation. Popliteal Vein: No evidence of thrombus. Normal compressibility, respiratory phasicity and response to augmentation. Calf Veins: No evidence of thrombus. Normal compressibility and flow on color Doppler imaging. Superficial Great Saphenous Vein: No evidence of thrombus. Normal compressibility. Venous Reflux:  None. Other Findings: No evidence of superficial thrombophlebitis or abnormal fluid collection. IMPRESSION: No evidence of left lower extremity deep venous thrombosis. Electronically Signed   By: Irish Lack M.D.   On: 07/23/2020 09:09    Scheduled Meds: . albuterol  2.5 mg Nebulization TID  . amLODipine  2.5 mg Oral Daily  . aspirin EC  81 mg Oral Daily  . cefdinir  300 mg Oral Q12H  . enoxaparin (LOVENOX) injection  40 mg Subcutaneous Q24H  . fluticasone furoate-vilanterol  1 puff Inhalation Daily   And  . umeclidinium bromide  1 puff Inhalation Daily  . guaiFENesin  600 mg Oral Daily  . methylPREDNISolone (SOLU-MEDROL) injection  40 mg Intravenous Daily  . pantoprazole  40 mg Oral Daily  . sodium chloride flush  3 mL Intravenous Q12H  . sodium chloride  1 g Oral TID WC  . tamsulosin  0.4 mg Oral Daily   Continuous Infusions: . sodium chloride      Assessment/Plan:  1. COPD with exacerbation.   Continue Solu-Medrol daily dosing.  Continue nebulizer treatments. 2. Multifocal pneumonia seen on CT scan.  Finished Zithromax already.  Will likely finish up Omnicef here. 3. Severe hyponatremia.  Sodium this morning 123.  Nephrology added a dose of tolvaptan today.  Sodium up to 124 this afternoon.  Continue salt tablets.  Hold Lasix while giving tolvaptan. 4. Acute hypoxic respiratory failure secondary to COPD exacerbation pneumonia.  The patient did have a pulse ox of 89% on 1 L of oxygen (less than 90%).  Continue to try to taper oxygen. 5. Left leg pain.  Ultrasound the left lower extremity negative for DVT.  Able to move around left hip and left knee.  Likely musculoskeletal.  CPK added on which was normal range.  Hold statin for right now. 6. Essential hypertension on low-dose of Norvasc 7. Hyperlipidemia unspecified hold statin 8. GERD.  Continue PPI 9. BPH.  On  Flomax 10. Palliative care consultation 11. Weakness.  Physical therapy recommending rehab.  Transitional care team having difficulty finding a rehab at this point.  From assisted living.        Code Status:     Code Status Orders  (From admission, onward)         Start     Ordered   07/18/20 1537  Do not attempt resuscitation (DNR)  Continuous       Question Answer Comment  In the event of cardiac or respiratory ARREST Do not call a "code blue"   In the event of cardiac or respiratory ARREST Do not perform Intubation, CPR, defibrillation or ACLS   In the event of cardiac or respiratory ARREST Use medication by any route, position, wound care, and other measures to relive pain and suffering. May use oxygen, suction and manual treatment of airway obstruction as needed for comfort.      07/18/20 1536        Code Status History    Date Active Date Inactive Code Status Order ID Comments User Context   07/17/2020 1958 07/18/2020 1536 Full Code [id number]  Briscoe Deutscher, MD ED   01/17/2019 1644 01/20/2019 2103 Full  Code [id number]  Jama Flavors, MD ED   12/28/2018 1840 12/30/2018 1913 Full Code [id number]  Altamese Dilling, MD ED   11/01/2017 1225 11/06/2017 1702 Full Code [id number]  Ihor Austin, MD Inpatient   10/02/2017 1525 10/03/2017 1756 Full Code [id number]  Annice Needy, MD Inpatient   08/14/2017 0935 08/15/2017 2214 Full Code [id number]  Milagros Loll, MD ED   Advance Care Planning Activity    Advance Directive Documentation     Most Recent Value  Type of Advance Directive Living will  Pre-existing out of facility DNR order (yellow form or pink MOST form) --  "MOST" Form in Place? --     Family Communication: Spoke with patient's daughter on the phone Disposition Plan: Status is: Inpatient   Dispo: The patient is from: Assisted living              Anticipated d/c is to: Assisted living versus rehab.  Transitional care team having some difficulty with finding a rehab for him.              Anticipated d/c date is: Will have to have a place to go.  Potentially can find a place on Monday, 07/25/2020              Patient currently being treated for hyponatremia and COPD exacerbation and pneumonia.  Consultants:  Nephrology  Antibiotics:  Omnicef  Time spent: 27 minutes  Lisa Blakeman [employer]

## 2020-07-23 NOTE — Consult Note (Signed)
MEDICATION RELATED CONSULT NOTE - INITIAL   Pharmacy Consult for [facility](tolvaptan) pharmacy monitoring  Indication: hyponatremia  No Known Allergies  Patient Measurements: Height: 5\' 9"  (175.3 cm) Weight: 69.9 kg (154 lb 1.6 oz) IBW/kg (Calculated) : 70.7  Vital Signs: Temp: 97.7 F (36.5 C) (12/04 0750) Temp Source: Oral (12/04 0750) BP: 177/89 (12/04 0750) Pulse Rate: 74 (12/04 0750) Intake/Output from previous day: 12/03 0701 - 12/04 0700 In: -  Out: 702 [Urine:701; Stool:1] Intake/Output from this shift: Total I/O In: -  Out: 300 [Urine:300]  Labs: Estimated Creatinine Clearance: 63.1 mL/min (A) (by C-G formula based on SCr of 0.6 mg/dL (L)).   Microbiology:  Medical History: Past Medical History:  Diagnosis Date  . Carotid arterial disease (HCC) 10/02/2017  . CVA (cerebral vascular accident) (HCC) 08/14/2017  . Depression   . Diastolic heart failure (HCC)   . Family history of adverse reaction to anesthesia   . HOH (hard of hearing)   . HTN (hypertension)   . Hydropneumothorax 11/01/2017  . Hyperlipidemia 10/09/2017  . Malnutrition of moderate degree 11/02/2017  . Pneumonia   . Prostate enlargement   . Psoriasis     Assessment/Medications:  86 yo male with hyponatremia with sodium remaining low on NaCl tabs and low dose furosemide.  Nephrology is starting tolvaptan 15mg  x 1.    Current Na level is 123.  Goal of Therapy:  Na wnl's  Plan:  Will monitor Na q8 hours and re-evaluate tolvaptan tomorrow pending Na level.  Will message nephrology if Na rises too quickly  98, PharmD, BCPS Clinical Pharmacist 07/23/2020 9:32 AM

## 2020-07-23 NOTE — Consult Note (Signed)
MEDICATION RELATED CONSULT NOTE - INITIAL   Pharmacy Consult for [facility](tolvaptan) pharmacy monitoring  Indication: hyponatremia  No Known Allergies  Patient Measurements: Height: 5\' 9"  (175.3 cm) Weight: 69.9 kg (154 lb 1.6 oz) IBW/kg (Calculated) : 70.7  Vital Signs: Temp: 97.6 F (36.4 C) (12/04 1659) Temp Source: Oral (12/04 1659) BP: 142/80 (12/04 1659) Pulse Rate: 86 (12/04 1659) Intake/Output from previous day: 12/03 0701 - 12/04 0700 In: -  Out: 702 [Urine:701; Stool:1] Intake/Output from this shift: Total I/O In: -  Out: 1000 [Urine:1000]  Labs: Estimated Creatinine Clearance: 63.1 mL/min (by C-G formula based on SCr of 0.62 mg/dL).   Microbiology:  Medical History: Past Medical History:  Diagnosis Date  . Carotid arterial disease (HCC) 10/02/2017  . CVA (cerebral vascular accident) (HCC) 08/14/2017  . Depression   . Diastolic heart failure (HCC)   . Family history of adverse reaction to anesthesia   . HOH (hard of hearing)   . HTN (hypertension)   . Hydropneumothorax 11/01/2017  . Hyperlipidemia 10/09/2017  . Malnutrition of moderate degree 11/02/2017  . Pneumonia   . Prostate enlargement   . Psoriasis     Assessment/Medications:  86 yo male with hyponatremia with sodium remaining low on NaCl tabs and low dose furosemide.  Nephrology is starting tolvaptan 15mg  x 1.    Starting Na level is 123 (12/4 @0617 ).  12/4 @1704  Na 130 - appropriate increase in Na level   Goal of Therapy:  Na WNL  Plan:  Monitor Na q8 hours and re-evaluate tolvaptan tomorrow pending Na level.  Will message nephrology if Na rises too quickly  03-24-1986, PharmD Pharmacy Resident  07/23/2020 5:31 PM

## 2020-07-24 DIAGNOSIS — I5033 Acute on chronic diastolic (congestive) heart failure: Secondary | ICD-10-CM | POA: Diagnosis not present

## 2020-07-24 LAB — BASIC METABOLIC PANEL
Anion gap: 9 (ref 5–15)
BUN: 29 mg/dL — ABNORMAL HIGH (ref 8–23)
CO2: 27 mmol/L (ref 22–32)
Calcium: 8.7 mg/dL — ABNORMAL LOW (ref 8.9–10.3)
Chloride: 96 mmol/L — ABNORMAL LOW (ref 98–111)
Creatinine, Ser: 0.7 mg/dL (ref 0.61–1.24)
GFR, Estimated: >60 mL/min (ref >60)
Glucose, Bld: 139 mg/dL — ABNORMAL HIGH (ref 70–99)
Potassium: 4.1 mmol/L (ref 3.5–5.1)
Sodium: 132 mmol/L — ABNORMAL LOW (ref 135–145)

## 2020-07-24 LAB — SODIUM
Sodium: 130 mmol/L — ABNORMAL LOW (ref 135–145)
Sodium: 129 mmol/L — ABNORMAL LOW (ref 135–145)

## 2020-07-24 MED ORDER — TOLVAPTAN 15 MG PO TABS
15.0000 mg | ORAL_TABLET | Freq: Once | ORAL | Status: AC
  Administered 2020-07-24: 15 mg via ORAL
  Filled 2020-07-24: qty 1

## 2020-07-24 MED ORDER — FUROSEMIDE 20 MG PO TABS
20.0000 mg | ORAL_TABLET | Freq: Every day | ORAL | Status: DC
  Administered 2020-07-25 – 2020-07-29 (×5): 20 mg via ORAL
  Filled 2020-07-24 (×5): qty 1

## 2020-07-24 MED ORDER — PREDNISONE 20 MG PO TABS
50.0000 mg | ORAL_TABLET | Freq: Every day | ORAL | Status: DC
  Administered 2020-07-25: 50 mg via ORAL
  Filled 2020-07-24: qty 1

## 2020-07-24 NOTE — Progress Notes (Signed)
Mobility Specialist - Progress Note   07/24/20 1400  Mobility  Range of Motion/Exercises Right leg;Left leg (ankle pumps, slr, hip abd/add, arm raises)  Level of Assistance Standby assist, set-up cues, supervision of patient - no hands on  Assistive Device None  Distance Ambulated (ft) 0 ft  Mobility Response Tolerated well  Mobility performed by Mobility specialist  $Mobility charge 1 Mobility    Pre-mobility: 85 HR, 93% SpO2 Post-mobility: 79 HR, 92% SpO2   Pt was lying in bed upon arrival. Pt agreed to session. Pt was able to perform exercises: ankle pumps, straight leg raises, hip abd/add, and arm raises SBA. Pt carries over demands well. Pt voices pain (with noticeably limited ROM) in LLE while performing hip abd and straight leg raises. O2 desat to 89% during activity, but quickly sat up to low-mid 90s with PLB. Overall, pt tolerated session well. Pt was left in bed with all needs in reach and alarm set.

## 2020-07-24 NOTE — Progress Notes (Signed)
[facility]  ROUNDING NOTE   Subjective:   Martin Bean is 86 years old gentleman admitted to [facility] on 07/17/2020.  Patient has multiple chronic medical conditions including history of stroke, hypertension, chronic diastolic congestive heart failure, and depression.  Patient presented to the emergency department with shortness of breath, cough, nausea, and generalized malaise.  Chest x-ray concerning for pneumonia.  Nephrology was consulted for the management of hyponatremia, initial sodium level was 122.   Patient sitting up in bed, daughter at the bedside. Daughter reports generalized weakness and increased dependence for ADLs. Otherwise no acute  issues voiced.  Objective:  Vital signs in last 24 hours:  Temp:  [97.5 F (36.4 C)-98.2 F (36.8 C)] 98 F (36.7 C) (12/05 1515) Pulse Rate:  [69-79] 79 (12/05 1515) Resp:  [19-22] 19 (12/05 1515) BP: (133-175)/(63-88) 137/78 (12/05 1515) SpO2:  [94 %-100 %] 94 % (12/05 1515) Weight:  [67.9 kg] 67.9 kg (12/05 0303)  Weight change: -1.996 kg Filed Weights   07/22/20 0413 07/23/20 0514 07/24/20 0303  Weight: 67.8 kg 69.9 kg 67.9 kg    Intake/Output: I/O last 3 completed shifts: In: 460 [P.O.:460] Out: 3100 [Urine:3100]   Intake/Output this shift:  Total I/O In: -  Out: 500 [Urine:500]  Physical Exam: General:  Awake, alert, appears comfortable  Head: Moist oral mucosal membranes  Eyes: Sclerae and conjunctivae clear  Lungs:  respirations even, unlabored, requiring 2 L supplemental O2 via nasal cannula  Heart: S1S2, no rubs or gallops  Abdomen:  Soft, nontender, nondistended  Extremities:  No peripheral edema.  Neurologic:  Able to answer simple questions appropriately, hard of hearing  Skin: No acute lesions or rashes    Basic Metabolic Panel: Recent Labs  Lab 07/21/20 0620 07/21/20 0620 07/22/20 0731 07/22/20 0731 07/23/20 0617 07/23/20 0617 07/23/20 0716 07/23/20 1704 07/24/20 0049 07/24/20 0630  07/24/20 0942  NA 121*   < > 123*   < > 123*   < > 124* 130* 130* 132* 129*  K 3.9  --  4.1  --  3.9  --  4.0  --   --  4.1  --   CL 88*  --  89*  --  88*  --  89*  --   --  96*  --   CO2 23  --  24  --  25  --  25  --   --  27  --   GLUCOSE 156*  --  198*  --  161*  --  156*  --   --  139*  --   BUN 14  --  12  --  21  --  20  --   --  29*  --   CREATININE 0.54*  --  0.57*  --  0.60*  --  0.62  --   --  0.70  --   CALCIUM 8.6*   < > 8.6*   < > 8.7*  --  8.7*  --   --  8.7*  --    < > = values in this interval not displayed.    Liver Function Tests: Recent Labs  Lab 07/23/20 0716  AST 40  ALT 58*  ALKPHOS 72  BILITOT 0.7  PROT 6.2*  ALBUMIN 2.9*   No results for input(s): LIPASE, AMYLASE in the last 168 hours. No results for input(s): AMMONIA in the last 168 hours.  CBC: Recent Labs  Lab 07/19/20 0607 07/20/20 0117 07/23/20 0612  WBC 19.7*  17.4* 13.1*  NEUTROABS 17.6* 15.2*  --   HGB 10.1* 9.6* 10.4*  HCT 28.1* 27.2* 29.4*  MCV 85.4 86.1 86.0  PLT 259 239 322    Cardiac Enzymes: Recent Labs  Lab 07/23/20 0716  CKTOTAL 32*    BNP: Invalid input(s): POCBNP  CBG: No results for input(s): GLUCAP in the last 168 hours.  Microbiology: Results for orders placed or performed during the hospital encounter of 07/17/20  Resp Panel by RT-PCR (Flu A&B, Covid) Nasopharyngeal Swab     Status: None   Collection Time: 07/17/20  6:26 PM   Specimen: Nasopharyngeal Swab; Nasopharyngeal(NP) swabs in vial transport medium  Result Value Ref Range Status   SARS Coronavirus 2 by RT PCR NEGATIVE NEGATIVE Final    Comment: (NOTE) SARS-CoV-2 target nucleic acids are NOT DETECTED.  The SARS-CoV-2 RNA is generally detectable in upper respiratory specimens during the acute phase of infection. The lowest concentration of SARS-CoV-2 viral copies this assay can detect is 138 copies/mL. A negative result does not preclude SARS-Cov-2 infection and should not be used as the sole basis  for treatment or other patient management decisions. A negative result may occur with  improper specimen collection/handling, submission of specimen other than nasopharyngeal swab, presence of viral mutation(s) within the areas targeted by this assay, and inadequate number of viral copies(<138 copies/mL). A negative result must be combined with clinical observations, patient history, and epidemiological information. The expected result is Negative.  Fact Sheet for Patients:  BloggerCourse.com  Fact Sheet for Healthcare Providers:  SeriousBroker.it  This test is no t yet approved or cleared by the [place] FDA and  has been authorized for detection and/or diagnosis of SARS-CoV-2 by FDA under an Emergency Use Authorization (EUA). This EUA will remain  in effect (meaning this test can be used) for the duration of the COVID-19 declaration under Section 564(b)(1) of the Act, 21 U.S.C.section 360bbb-3(b)(1), unless the authorization is terminated  or revoked sooner.       Influenza A by PCR NEGATIVE NEGATIVE Final   Influenza B by PCR NEGATIVE NEGATIVE Final    Comment: (NOTE) The Xpert Xpress SARS-CoV-2/FLU/RSV plus assay is intended as an aid in the diagnosis of influenza from Nasopharyngeal swab specimens and should not be used as a sole basis for treatment. Nasal washings and aspirates are unacceptable for Xpert Xpress SARS-CoV-2/FLU/RSV testing.  Fact Sheet for Patients: BloggerCourse.com  Fact Sheet for Healthcare Providers: SeriousBroker.it  This test is not yet approved or cleared by the [place] FDA and has been authorized for detection and/or diagnosis of SARS-CoV-2 by FDA under an Emergency Use Authorization (EUA). This EUA will remain in effect (meaning this test can be used) for the duration of the COVID-19 declaration under Section 564(b)(1) of the Act, 21  U.S.C. section 360bbb-3(b)(1), unless the authorization is terminated or revoked.  Performed at [facility], [address] [place], [place] [address]   Blood culture (routine x 2)     Status: None   Collection Time: 07/17/20  7:52 PM   Specimen: BLOOD  Result Value Ref Range Status   Specimen Description BLOOD RIGHT ANTECUBITAL  Final   Special Requests   Final    BOTTLES DRAWN AEROBIC AND ANAEROBIC Blood Culture results may not be optimal due to an excessive volume of blood received in culture bottles   Culture   Final    NO GROWTH 5 DAYS Performed at [facility], [address], [place], [place] [address]  Report Status 07/22/2020 FINAL  Final  Blood culture (routine x 2)     Status: None   Collection Time: 07/17/20  7:52 PM   Specimen: BLOOD  Result Value Ref Range Status   Specimen Description BLOOD BLOOD LEFT FOREARM  Final   Special Requests   Final    BOTTLES DRAWN AEROBIC AND ANAEROBIC Blood Culture adequate volume   Culture   Final    NO GROWTH 5 DAYS Performed at [facility], [address], [place], [place] [address]    Report Status 07/22/2020 FINAL  Final    Coagulation Studies: No results for input(s): LABPROT, INR in the last 72 hours.  Urinalysis: No results for input(s): COLORURINE, LABSPEC, PHURINE, GLUCOSEU, HGBUR, BILIRUBINUR, KETONESUR, PROTEINUR, UROBILINOGEN, NITRITE, LEUKOCYTESUR in the last 72 hours.  Invalid input(s): APPERANCEUR    Imaging: US Venous Img Lower Unilateral Left (DVT)  Result Date: 07/23/2020 CLINICAL DATA:  Left lower extremity pain and edema. EXAM: LEFT LOWER EXTREMITY VENOUS DOPPLER ULTRASOUND TECHNIQUE: Gray-scale sonography with graded compression, as well as color Doppler and duplex ultrasound were performed to evaluate the lower extremity deep venous systems from the level of the common femoral vein and including the common femoral, femoral, profunda femoral, popliteal and calf veins  including the posterior tibial, peroneal and gastrocnemius veins when visible. The superficial great saphenous vein was also interrogated. Spectral Doppler was utilized to evaluate flow at rest and with distal augmentation maneuvers in the common femoral, femoral and popliteal veins. COMPARISON:  None. FINDINGS: Contralateral Common Femoral Vein: Respiratory phasicity is normal and symmetric with the symptomatic side. No evidence of thrombus. Normal compressibility. Common Femoral Vein: No evidence of thrombus. Normal compressibility, respiratory phasicity and response to augmentation. Saphenofemoral Junction: No evidence of thrombus. Normal compressibility and flow on color Doppler imaging. Profunda Femoral Vein: No evidence of thrombus. Normal compressibility and flow on color Doppler imaging. Femoral Vein: No evidence of thrombus. Normal compressibility, respiratory phasicity and response to augmentation. Popliteal Vein: No evidence of thrombus. Normal compressibility, respiratory phasicity and response to augmentation. Calf Veins: No evidence of thrombus. Normal compressibility and flow on color Doppler imaging. Superficial Great Saphenous Vein: No evidence of thrombus. Normal compressibility. Venous Reflux:  None. Other Findings: No evidence of superficial thrombophlebitis or abnormal fluid collection. IMPRESSION: No evidence of left lower extremity deep venous thrombosis. Electronically Signed   By: Irish Lack M.D.   On: 07/23/2020 09:09     Medications:   . sodium chloride     . albuterol  2.5 mg Nebulization TID  . amLODipine  2.5 mg Oral Daily  . aspirin EC  81 mg Oral Daily  . enoxaparin (LOVENOX) injection  40 mg Subcutaneous Q24H  . fluticasone furoate-vilanterol  1 puff Inhalation Daily   And  . umeclidinium bromide  1 puff Inhalation Daily  . [START ON 07/25/2020] furosemide  20 mg Oral Daily  . guaiFENesin  600 mg Oral Daily  . pantoprazole  40 mg Oral Daily  . [START ON  07/25/2020] predniSONE  50 mg Oral Q breakfast  . sodium chloride flush  3 mL Intravenous Q12H  . sodium chloride  1 g Oral TID WC  . tamsulosin  0.4 mg Oral Daily   sodium chloride, acetaminophen, guaiFENesin, labetalol, ondansetron (ZOFRAN) IV, oxyCODONE, sodium chloride flush  Assessment/ Plan:  Martin Bean is a 86 y.o.  male  admitted to [facility] on 07/17/2020.  Patient has multiple chronic medical conditions including history of stroke, hypertension, chronic  diastolic congestive heart failure, and depression.  Patient presented to the emergency department with shortness of breath, cough, nausea, and generalized malaise.  Chest x-ray concerning for pneumonia.  Nephrology was consulted for the management of hyponatremia, initial sodium level was 122.  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 Patient with underlying pneumonia/lung disease/emphysema Urine osmolality was 430 No thiazide diuretics on home medication Serum sodium level on January 20, 2019 was 137 TSH was normal in November 2021  Received one dose of Tolvaptan yesterday with Sodium improved to 132 this morning No additional Tolvaptan today We will restart low dose Furosemide  Advised patient to maintain fluid restriction   #Pneumonia #COPD exacerbation  2 L of supplemental O2 via nasal cannula with SpO2 maintaining well above 90% No acute respiratory distress Management per primary team  #Hypertension BP this morning 168/77 Continue Amlodipine Furosemide low dose restarted   LOS: 7 Tajia Szeliga 12/5/20215:28 PM

## 2020-07-24 NOTE — Consult Note (Signed)
MEDICATION RELATED CONSULT NOTE - INITIAL   Pharmacy Consult for [facility](tolvaptan) pharmacy monitoring  Indication: hyponatremia  No Known Allergies  Patient Measurements: Height: 5\' 9"  (175.3 cm) Weight: 69.9 kg (154 lb 1.6 oz) IBW/kg (Calculated) : 70.7  Vital Signs: Temp: 97.9 F (36.6 C) (12/04 1959) Temp Source: Oral (12/04 1959) BP: 142/63 (12/04 1959) Pulse Rate: 70 (12/04 1959) Intake/Output from previous day: 12/04 0701 - 12/05 0700 In: 120 [P.O.:120] Out: 2000 [Urine:2000] Intake/Output from this shift: Total I/O In: 120 [P.O.:120] Out: 550 [Urine:550]  Labs: Estimated Creatinine Clearance: 63.1 mL/min (by C-G formula based on SCr of 0.62 mg/dL).   Microbiology:  Medical History: Past Medical History:  Diagnosis Date  . Carotid arterial disease (HCC) 10/02/2017  . CVA (cerebral vascular accident) (HCC) 08/14/2017  . Depression   . Diastolic heart failure (HCC)   . Family history of adverse reaction to anesthesia   . HOH (hard of hearing)   . HTN (hypertension)   . Hydropneumothorax 11/01/2017  . Hyperlipidemia 10/09/2017  . Malnutrition of moderate degree 11/02/2017  . Pneumonia   . Prostate enlargement   . Psoriasis     Assessment/Medications:  86 yo male with hyponatremia with sodium remaining low on NaCl tabs and low dose furosemide.  Nephrology is starting tolvaptan 15mg  x 1.    Starting Na level is 123 (12/4 @0617 ).  12/4 @1704  Na 130 - appropriate increase in Na level  12/5 @ 0049 Na 130   Goal of Therapy:  Na WNL  Plan:  Monitor Na q8 hours and re-evaluate tolvaptan tomorrow pending Na level.  Will message nephrology if Na rises too quickly  Aldea Avis D, PharmD 07/24/2020 1:52 AM

## 2020-07-24 NOTE — Progress Notes (Signed)
[facility]  - [facility]   PATIENT NAME: Martin Bean    MR#:  [id number]  DATE OF BIRTH:  12/29/1929  SUBJECTIVE:  patient earlier sitting up at the edge of the bed and eating his breakfast. Daughter in the room. Daughter feels patient has improved a lot since admission. Sats are 99% on 2 L nasal cannula. Does not wear chronic oxygen. No respiratory distress noted. Good urine output.  Very weak and deconditioned. Daughter feels he needs a lot of help. Patient very hard on hearing. Most of the history and discussion is done with daughter   REVIEW OF SYSTEMS:   ROS patient hard of hearing tolerating Diet: yes tolerating PT: rehab  DRUG ALLERGIES:  No Known Allergies  VITALS:  Blood pressure 133/64, pulse 69, temperature 98.2 F (36.8 C), resp. rate 19, height 5\' 9"  (1.753 m), weight 67.9 kg, SpO2 94 %.  PHYSICAL EXAMINATION:   Physical Exam  GENERAL:  86 y.o.-year-old patient lying in the bed with no acute distress. Debilitated deconditioned HEENT: Head atraumatic, normocephalic. Oropharynx and nasopharynx clear.   LUNGS: distant breath sounds bilaterally, no wheezing, rales, rhonchi. No use of accessory muscles of respiration.  CARDIOVASCULAR: S1, S2 normal. No murmurs, rubs, or gallops.  ABDOMEN: Soft, nontender, nondistended. Bowel sounds present. No organomegaly or mass.  EXTREMITIES: No cyanosis, clubbing or edema b/l.    NEUROLOGIC: generalized weakness. Grossly nonfocal.  PSYCHIATRIC:  patient is alert and awake.  SKIN: No obvious rash, lesion, or ulcer.   LABORATORY PANEL:  CBC Recent Labs  Lab 07/23/20 0612  WBC 13.1*  HGB 10.4*  HCT 29.4*  PLT 322    Chemistries  Recent Labs  Lab 07/23/20 0716 07/23/20 1704 07/24/20 0630 07/24/20 0630 07/24/20 0942  NA 124*   < > 132*   < > 129*  K 4.0  --  4.1  --   --   CL 89*  --  96*  --   --   CO2 25  --  27  --   --   GLUCOSE 156*  --  139*  --   --   BUN 20  --  29*  --   --    CREATININE 0.62  --  0.70  --   --   CALCIUM 8.7*  --  8.7*  --   --   AST 40  --   --   --   --   ALT 58*  --   --   --   --   ALKPHOS 72  --   --   --   --   BILITOT 0.7  --   --   --   --    < > = values in this interval not displayed.   Cardiac Enzymes No results for input(s): TROPONINI in the last 168 hours. RADIOLOGY:  14/05/21 Venous Img Lower Unilateral Left (DVT)  Result Date: 07/23/2020 CLINICAL DATA:  Left lower extremity pain and edema. EXAM: LEFT LOWER EXTREMITY VENOUS DOPPLER ULTRASOUND TECHNIQUE: Gray-scale sonography with graded compression, as well as color Doppler and duplex ultrasound were performed to evaluate the lower extremity deep venous systems from the level of the common femoral vein and including the common femoral, femoral, profunda femoral, popliteal and calf veins including the posterior tibial, peroneal and gastrocnemius veins when visible. The superficial great saphenous vein was also interrogated. Spectral Doppler was utilized to evaluate flow at rest and with distal augmentation maneuvers in the  common femoral, femoral and popliteal veins. COMPARISON:  None. FINDINGS: Contralateral Common Femoral Vein: Respiratory phasicity is normal and symmetric with the symptomatic side. No evidence of thrombus. Normal compressibility. Common Femoral Vein: No evidence of thrombus. Normal compressibility, respiratory phasicity and response to augmentation. Saphenofemoral Junction: No evidence of thrombus. Normal compressibility and flow on color Doppler imaging. Profunda Femoral Vein: No evidence of thrombus. Normal compressibility and flow on color Doppler imaging. Femoral Vein: No evidence of thrombus. Normal compressibility, respiratory phasicity and response to augmentation. Popliteal Vein: No evidence of thrombus. Normal compressibility, respiratory phasicity and response to augmentation. Calf Veins: No evidence of thrombus. Normal compressibility and flow on color Doppler imaging.  Superficial Great Saphenous Vein: No evidence of thrombus. Normal compressibility. Venous Reflux:  None. Other Findings: No evidence of superficial thrombophlebitis or abnormal fluid collection. IMPRESSION: No evidence of left lower extremity deep venous thrombosis. Electronically Signed   By: Irish Lack M.D.   On: 07/23/2020 09:09   ASSESSMENT AND PLAN:  Martin Bean a 86 y.o.malewith medical history significant forhistory of CVA, hypertension, chronic diastolic CHF, and depression, now presenting to the emergency department for evaluation of shortness of breath, cough, nausea, and general malaise. Patient is accompanied by his daughter who assists with the history.   1.  Acute hypoxic respiratory failure secondary to CAP  COPD exacerbation -- Does not use any oxygen at home.  -- Patient initially was hypoxic with pulse ox of 89% on 1 L. Now weaned off to room air. Sats are 99% --Procalcitonin unremarkable.   -- Seen by SLP.  They advanced him to dysphagia 2 diet.  -- He remains afebrile  -- leukocytosis improved. -- Patient completed his antibiotic course. -- Switch to oral prednisone taper. Continue nebulizer and inhalers  2. Hyponatremia due to SIADH: Presented with sodium of 122 which initially dropped and then now has started to improve.  Continue fluid restriction  -- patient received a dose of Tolvaptan sodium improved to 132 -- good urine output. Nephrology following patient.  3. Hypertensive urgency: Patient presented with BP as high as 207/92 in ED.   -- continue Norvasc  4. Acute on chronic diastolic CHF: Patient was presumed to have acute on chronic diastolic CHF.  - He received 1 dose of Lasix.  He does not seem to have any acute exacerbation anymore.  He is not taking any Lasix at home. --  He looks euvolemic.  Monitor daily. --Echo with normal ejection fraction, no diastolic dysfunction.  5. Depression  - Hold Zoloft in light of hyponatremia    6.   Hyperlipidemia: Continue atorvastatin.  7.  BPH: Continue Flomax.  8.  Generalized weakness: Patient looks significantly weak.  -- Physical therapy recommends rehab.  Palliative care consultation for goals of care, multiple comorbidities overall decline in health. Discussed with daughter at length regarding patient's multiple comorbidities. Patient overall has a poor long-term prognosis. Discussed options for hospice with daughter is one of the long-term plans.   DVT prophylaxis: enoxaparin (LOVENOX) injection 40 mg Start: 07/18/20 1615   Code Status: DNR  Family Communication:  spoke with patient's daughter at bedside.   Status is: Inpatient  Remains inpatient appropriate because:Inpatient level of care appropriate due to severity of illness   Dispo: The patient is from: Home  Anticipated d/c is to: SNF vs home with home health  Anticipated d/c date is: 1 to 2 days  Patient currently is not medically stable to d/c.  TOTAL TIME TAKING CARE OF THIS PATIENT: *30* minutes.  >50% time spent on counselling and coordination of care  Note: This dictation was prepared with Dragon dictation along with smaller phrase technology. Any transcriptional errors that result from this process are unintentional.  Enedina Finner M.D    [facility]   CC: Primary care physician; Gracelyn Nurse, MDPatient ID: Martin Bean, male   DOB: 1930-04-15, 86 y.o.   MRN: [id number]

## 2020-07-24 NOTE — Plan of Care (Signed)
°  Problem: Clinical Measurements: °Goal: Respiratory complications will improve °Outcome: Progressing °  °Problem: Pain Managment: °Goal: General experience of comfort will improve °Outcome: Progressing °  °Problem: Safety: °Goal: Ability to remain free from injury will improve °Outcome: Progressing °  °

## 2020-07-24 NOTE — TOC Progression Note (Signed)
Transition of Care [facility]) - Progression Note    Patient Details  Name: Martin Bean MRN: [id number] Date of Birth: 29-Nov-1929  Transition of Care [facility]) CM/SW Contact  Bing Quarry, RN Phone Number: 07/24/2020, 4:40 PM  Clinical Narrative:    Per provider notes 12/5:  Anticipated d/c is to: SNFvshome with home health Anticipated d/c date is: 1 to 2 days Patient currently is not medically stable to d/c.  Palliative to see Monday 12/6. Options such as HH Hospice need to be discussed with family as placement will be difficult with condition and declining status per notes. Gabriel Cirri RN CM     Expected Discharge Plan: Skilled Nursing Facility Barriers to Discharge: Continued Medical Work up  Expected Discharge Plan and Services Expected Discharge Plan: Skilled Nursing Facility In-house Referral: Clinical Social Work   Post Acute Care Choice: Skilled Nursing Facility Living arrangements for the past 2 months: Assisted Living Facility [facility])                                       Social Determinants of Health (SDOH) Interventions    Readmission Risk Interventions Readmission Risk Prevention Plan 01/19/2019  Transportation Screening Complete  PCP or Specialist Appt within 5-7 Days Complete  Home Care Screening Complete  Medication Review (RN CM) Complete  Some recent data might be hidden

## 2020-07-25 DIAGNOSIS — Z515 Encounter for palliative care: Secondary | ICD-10-CM

## 2020-07-25 DIAGNOSIS — I5033 Acute on chronic diastolic (congestive) heart failure: Secondary | ICD-10-CM | POA: Diagnosis not present

## 2020-07-25 LAB — SODIUM: Sodium: 132 mmol/L — ABNORMAL LOW (ref 135–145)

## 2020-07-25 MED ORDER — PREDNISONE 20 MG PO TABS
40.0000 mg | ORAL_TABLET | Freq: Every day | ORAL | Status: DC
  Administered 2020-07-26: 40 mg via ORAL

## 2020-07-25 NOTE — Progress Notes (Signed)
[facility] [place] NAME: Martin Bean    MR#:  [id number]  DATE OF BIRTH:  [birth year]  SUBJECTIVE:  Very weak and deconditioned. Daughter feels he needs a lot of help. Patient very hard on hearing.  Breathing has improved. Daughter from [place] is in the room.  REVIEW OF SYSTEMS:   Review of Systems  Constitutional: Negative for chills, fever and weight loss.  HENT: Negative for ear discharge, ear pain and nosebleeds.   Eyes: Negative for blurred vision, pain and discharge.  Respiratory: Negative for sputum production, shortness of breath, wheezing and stridor.   Cardiovascular: Negative for chest pain, palpitations, orthopnea and PND.  Gastrointestinal: Negative for abdominal pain, diarrhea, nausea and vomiting.  Genitourinary: Negative for frequency and urgency.  Musculoskeletal: Negative for back pain and joint pain.  Neurological: Positive for weakness. Negative for sensory change, speech change and focal weakness.  Psychiatric/Behavioral: Negative for depression and hallucinations. The patient is not nervous/anxious.    patient hard of hearing tolerating Diet: yes tolerating PT: rehab  DRUG ALLERGIES:  No Known Allergies  VITALS:  Blood pressure (!) 144/63, pulse 77, temperature 98.2 F (36.8 C), temperature source Oral, resp. rate (!) 28, height _0  (1.753 m), weight 67.7 kg, SpO2 94 %.  PHYSICAL EXAMINATION:   Physical Exam  GENERAL:  86 y.o.-year-old patient lying in the bed with no acute distress. Debilitated deconditioned HEENT: Head atraumatic, normocephalic. Oropharynx and nasopharynx clear.   LUNGS: distant breath sounds bilaterally, no wheezing, rales, rhonchi. No use of accessory muscles of respiration.  CARDIOVASCULAR: S1, S2 normal. No murmurs, rubs, or gallops.  ABDOMEN: Soft, nontender, nondistended. Bowel sounds present. No organomegaly or mass.  EXTREMITIES: No cyanosis, clubbing or edema b/l.     NEUROLOGIC: generalized weakness. Grossly nonfocal.  PSYCHIATRIC:  patient is alert and awake.  SKIN: No obvious rash, lesion, or ulcer.   LABORATORY PANEL:  CBC Recent Labs  Lab 07/23/20 0612  WBC 13.1*  HGB 10.4*  HCT 29.4*  PLT 322    Chemistries  Recent Labs  Lab 07/23/20 0716 07/23/20 1704 07/24/20 0630 07/24/20 0942 07/25/20 0501  NA 124*   < > 132*   < > 132*  K 4.0  --  4.1  --   --   CL 89*  --  96*  --   --   CO2 25  --  27  --   --   GLUCOSE 156*  --  139*  --   --   BUN 20  --  29*  --   --   CREATININE 0.62  --  0.70  --   --   CALCIUM 8.7*  --  8.7*  --   --   AST 40  --   --   --   --   ALT 58*  --   --   --   --   ALKPHOS 72  --   --   --   --   BILITOT 0.7  --   --   --   --    < > = values in this interval not displayed.   Cardiac Enzymes No results for input(s): TROPONINI in the last 168 hours. RADIOLOGY:  No results found. ASSESSMENT AND PLAN:  Martin Bean a 86 y.o.malewith medical history significant forhistory of CVA, hypertension, chronic diastolic CHF, and depression, now presenting to the emergency department for evaluation of shortness of breath, cough,  nausea, and general malaise. Patient is accompanied by his daughter who assists with the history.   1.  Acute hypoxic respiratory failure secondary to CAP  COPD exacerbation -- Does not use any oxygen at home.  -- Patient initially was hypoxic with pulse ox of 89% on 1 L. Now weaned off to room air. Sats are 99% --Procalcitonin unremarkable.   -- Seen by SLP.  They advanced him to dysphagia 2 diet. He seems to be pocketing food--at risk for Aspiration -- remains afebrile  -- leukocytosis improved. -- Patient completed his antibiotic course. -- Switch to oral prednisone taper. Continue nebulizer and inhalers -prednisone taper  2. Hyponatremia due to SIADH: Presented with sodium of 122 which initially dropped and then now has started to improve.  Continue fluid restriction   -- patient received a dose of Tolvaptan sodium improved to 132 -- good urine output. Nephrology following patient.  3. Hypertensive urgency: Patient presented with BP as high as 207/92 in ED.   -- continue Norvasc  4. Acute on chronic diastolic CHF: Patient was presumed to have acute on chronic diastolic CHF.  - He received 1 dose of Lasix.  He does not seem to have any acute exacerbation anymore.  He is not taking any Lasix at home. --  He looks euvolemic.  Monitor daily. --Echo with normal ejection fraction, no diastolic dysfunction.  5. Depression  - Hold Zoloft in light of hyponatremia    6.  Hyperlipidemia: Continue atorvastatin.  7.  BPH: Continue Flomax.  8.  Generalized weakness: Patient looks significantly weak.  -- Physical therapy recommends rehab.  Palliative care consultation for goals of care, multiple comorbidities overall decline in health. Discussed with daughter at length regarding patient's multiple comorbidities. Patient overall has a poor long-term prognosis given recent decline  Palliative care met with patient and daughter in the room today. They will revisit tomorrow and in the meantime daughter is getting in touch with other siblings to discuss about patient.  DVT prophylaxis: enoxaparin (LOVENOX) injection 40 mg Start: 07/18/20 1615   Code Status: DNR  Family Communication:  spoke with patient's daughter at bedside.   Status is: Inpatient  Remains inpatient appropriate because:Inpatient level of care appropriate due to severity of illness   Dispo: The patient is from: Home  Anticipated d/c is to: SNF vs home with home health  Anticipated d/c date is: 1 to 2 days  Patient currently is  medically stable to d/c.  awaitng discussion with palliative care and rehab bed. Per TOC no beds offered so far     TOTAL TIME TAKING CARE OF THIS PATIENT: *25* minutes.  >50% time spent on counselling and coordination  of care  Note: This dictation was prepared with Dragon dictation along with smaller phrase technology. Any transcriptional errors that result from this process are unintentional.  Fritzi Mandes M.D    [facility]   CC: Primary care physician; Baxter Hire, MDPatient ID: Martin Bean, male   DOB: 03/10/1930, 86 y.o.   MRN: [id number]

## 2020-07-25 NOTE — Progress Notes (Signed)
Notified Attending MD of RR 28 on room air- no new orders, advised to monitor.

## 2020-07-25 NOTE — Progress Notes (Signed)
OT Cancellation Note  Patient Details Name: [name] MRN: [id number] DOB: 07/08/1930   Cancelled Treatment:    Reason Eval/Treat Not Completed: Other (comment)  Upon chart review, noted that family and palliative care team met today and are meeting again tomorrow to further establish pt's goals of care and wishes. Will hold OT at this time and f/u at later date time if appropriate. Thank you,  Gerrianne Scale, [place], OTR/L ascom [phone] 07/25/20, 4:42 PM

## 2020-07-25 NOTE — Progress Notes (Signed)
Notified MD of RR 36 SPO2 93 on room air.  Patient alert/oriented, no signs distress. Does report some SOB. Applied O2 at 2L/[place].  RR decreased to 26, reports relief.  No new orders from MD except to monitor patient.    07/25/20 1823  Assess: MEWS Score  Temp 97.7 F (36.5 C)  BP (!) 145/79  Pulse Rate 94  Resp (!) 36  SpO2 93 %  O2 Device Room Air  Assess: MEWS Score  MEWS Temp 0  MEWS Systolic 0  MEWS Pulse 0  MEWS RR 3  MEWS LOC 0  MEWS Score 3  MEWS Score Color Yellow

## 2020-07-25 NOTE — TOC Progression Note (Signed)
Transition of Care [facility]) - Progression Note    Patient Details  Name: Martin Bean MRN: [id number] Date of Birth: [birth year]  [facility] CM/SW Contact  Chapman Fitch, RN Phone Number: 07/25/2020, 3:07 PM  Clinical Narrative:     Palliative meeting was held today.  Plan for patient and children to meet with Palliative tomorrow at 10am  Currently there are no SNF bed offers.  Daughter [name] agreeable to extend bed search should we go the SNF route.  Bed search extended.  TOC to follow up tomorrow after Palliative meeting  Expected Discharge Plan: Skilled Nursing Facility Barriers to Discharge: Continued Medical Work up  Expected Discharge Plan and Services Expected Discharge Plan: Skilled Nursing Facility In-house Referral: Clinical Social Work   Post Acute Care Choice: Skilled Nursing Facility Living arrangements for the past 2 months: Assisted Living Facility ([facility])                                       Social Determinants of Health (SDOH) Interventions    Readmission Risk Interventions Readmission Risk Prevention Plan 01/19/2019  Transportation Screening Complete  PCP or Specialist Appt within 5-7 Days Complete  Home Care Screening Complete  Medication Review (RN CM) Complete  Some recent data might be hidden

## 2020-07-25 NOTE — Progress Notes (Signed)
SLP Cancellation Note  Patient Details Name: Martin Bean MRN: [id number] DOB: [birth year]   Cancelled treatment:       Reason Eval/Treat Not Completed: Medical issues which prohibited therapy (chart reviewed; consulted Dtr. and Palliative Care). Spoke w/ Dtr briefly outside of room as pt and Dtr had just finished meeting w/ Palliative Care. Pt was also getting pain medicine from NSG in room. Discussion has been focused on pt's "steady decline" at ALF; pureed diet. He is DNR status. Pt has expressed thoughts that he wants to focus on comfort until "God takes him home".  ST services will f/u w/ pt/family tomorrow for any further education needed. Pt remains on a Pureed diet consistency for ease of chewing and swallowing, thin liquids w/ aspiration precautions. Dtr agreed.     Jerilynn Som, MS, CCC-SLP Speech Language Pathologist Rehab Services [phone] [facility] 07/25/2020, 4:27 PM

## 2020-07-25 NOTE — Consult Note (Signed)
Consultation Note Date: 07/25/2020   Patient Name: Martin Bean  DOB: [birth year]  MRN: [id number]  Age / Sex: 86 y.o., male  PCP: Gracelyn Nurse, MD Referring Physician: Enedina Finner, MD  Reason for Consultation: Establishing goals of care  HPI/Patient Profile: Martin Bean a 86 y.o.malewith medical history significant forhistory of CVA, hypertension, chronic diastolic CHF, and depression, now presenting to the emergency department for evaluation of shortness of breath, cough, nausea, and general malaise.  Clinical Assessment and Goals of Care: Patient is resting in bed, his daughter is at bedside. He is eating a dysphagia diet. Daughter discusses his stroke 3 years ago, and boughts with PNA 2 years ago. She discusses his steady decline. She states now at his ALF, he spends a great amount of time in his chair, and sleeps in it at night. She states occasionally he walks around out side.   Patient is oriented and answers questions appropriately. He states he has advanced directives, but wants his children to make decisions together as a team if he is unable to make decisions for himself. We discussed his diagnosis, prognosis, GOC, EOL wishes disposition and options.   A detailed discussion was had today regarding advanced directives.  Concepts specific to code status, artifical feeding and hydration, IV antibiotics and rehospitalization were discussed.  The difference between an aggressive medical intervention path and a comfort care path was discussed.  Values and goals of care important to patient and family were attempted to be elicited.  Discussed limitations of medical interventions to prolong quality of life in some situations and discussed the concept of human mortality.  Created space and opportunity for patient  to explore thoughts and feelings regarding current medical information. He states  he prayed last week that God would take him home. He states he is ready to die and really wants to focus on comfort until that time. His daughter states she will speak with her siblings about options for D/C. SLF Natalia Leatherwood spoke with daughter and I. She discussed the food becoming backed up in his esophagus, and his risk for aspiration particularly if he eats as he wishes.   Will meet again at 10:00 tomorrow morning.   SUMMARY OF RECOMMENDATIONS   Will remeet at 10:00 tomorrow morning.   Prognosis:   Poor       Primary Diagnoses: Present on Admission: . Acute on chronic diastolic CHF (congestive heart failure) (HCC) . CAP (community acquired pneumonia) . Hyponatremia . Depression, prolonged . Hypertensive urgency   I have reviewed the medical record, interviewed the patient and family, and examined the patient. The following aspects are pertinent.  Past Medical History:  Diagnosis Date  . Carotid arterial disease (HCC) 10/02/2017  . CVA (cerebral vascular accident) (HCC) 08/14/2017  . Depression   . Diastolic heart failure (HCC)   . Family history of adverse reaction to anesthesia   . HOH (hard of hearing)   . HTN (hypertension)   . Hydropneumothorax 11/01/2017  . Hyperlipidemia 10/09/2017  . Malnutrition of  moderate degree 11/02/2017  . Pneumonia   . Prostate enlargement   . Psoriasis    Social History   Socioeconomic History  . Marital status: Widowed    Spouse name: Not on file  . Number of children: Not on file  . Years of education: Not on file  . Highest education level: Not on file  Occupational History  . Occupation: retired  Tobacco Use  . Smoking status: Former Smoker    Types: Cigarettes    Quit date: 09/24/2013    Years since quitting: 6.8  . Smokeless tobacco: Never Used  Vaping Use  . Vaping Use: Never used  Substance and Sexual Activity  . Alcohol use: No  . Drug use: No  . Sexual activity: Not on file  Other Topics Concern  . Not on file   Social History Narrative  . Not on file   Social Determinants of Health   Financial Resource Strain:   . Difficulty of Paying Living Expenses: Not on file  Food Insecurity:   . Worried About Programme researcher, broadcasting/film/video in the Last Year: Not on file  . Ran Out of Food in the Last Year: Not on file  Transportation Needs:   . Lack of Transportation (Medical): Not on file  . Lack of Transportation (Non-Medical): Not on file  Physical Activity:   . Days of Exercise per Week: Not on file  . Minutes of Exercise per Session: Not on file  Stress:   . Feeling of Stress : Not on file  Social Connections:   . Frequency of Communication with Friends and Family: Not on file  . Frequency of Social Gatherings with Friends and Family: Not on file  . Attends Religious Services: Not on file  . Active Member of Clubs or Organizations: Not on file  . Attends Banker Meetings: Not on file  . Marital Status: Not on file   Family History  Problem Relation Age of Onset  . Stroke Mother    Scheduled Meds: . albuterol  2.5 mg Nebulization TID  . amLODipine  2.5 mg Oral Daily  . aspirin EC  81 mg Oral Daily  . enoxaparin (LOVENOX) injection  40 mg Subcutaneous Q24H  . fluticasone furoate-vilanterol  1 puff Inhalation Daily   And  . umeclidinium bromide  1 puff Inhalation Daily  . furosemide  20 mg Oral Daily  . guaiFENesin  600 mg Oral Daily  . pantoprazole  40 mg Oral Daily  . [START ON 07/26/2020] predniSONE  40 mg Oral Q breakfast  . sodium chloride flush  3 mL Intravenous Q12H  . sodium chloride  1 g Oral TID WC  . tamsulosin  0.4 mg Oral Daily   Continuous Infusions: . sodium chloride     PRN Meds:.sodium chloride, acetaminophen, guaiFENesin, labetalol, ondansetron (ZOFRAN) IV, oxyCODONE, sodium chloride flush Medications Prior to Admission:  Prior to Admission medications   Medication Sig Start Date End Date Taking? Authorizing Provider  albuterol (VENTOLIN HFA) 108 (90 Base)  MCG/ACT inhaler Inhale 2 puffs into the lungs 2 (two) times daily as needed for wheezing or shortness of breath.   Yes [provider]  aspirin EC 81 MG tablet Take 81 mg by mouth daily.  08/16/17  Yes [provider]  atorvastatin (LIPITOR) 40 MG tablet Take 1 tablet (40 mg total) by mouth daily at 6 PM. 08/15/17  Yes Sudini, Srikar, MD  Chlorphen-Phenyleph-ASA (ALKA-SELTZER PLUS COLD) 2-7.8-325 MG TBEF Take 2 tablets by mouth  every 8 (eight) hours as needed (for headache or congestion).   Yes [provider]  guaiFENesin (MUCINEX) 600 MG 12 hr tablet Take 600 mg by mouth daily.   Yes [provider]  guaiFENesin (MUCINEX) 600 MG 12 hr tablet Take 600 mg by mouth every 12 (twelve) hours as needed for cough (for cough or congestion).   Yes [provider]  midodrine (PROAMATINE) 10 MG tablet Take 10 mg by mouth 3 (three) times daily. 06/10/20  Yes [provider]  omeprazole (PRILOSEC) 40 MG capsule Take 40 mg by mouth daily. 04/11/20  Yes [provider]  sertraline (ZOLOFT) 50 MG tablet Take 50 mg by mouth daily.  10/16/18 07/17/20 Yes [provider]  tamsulosin (FLOMAX) 0.4 MG CAPS capsule Take 0.4 mg by mouth daily.  06/03/17  Yes [provider]  TRELEGY ELLIPTA 100-62.5-25 MCG/INH AEPB Inhale 1 puff into the lungs daily. 06/29/20  Yes [provider]   No Known Allergies Review of Systems  All other systems reviewed and are negative.   Physical Exam Pulmonary:     Comments: Coughing noted after eating.  Neurological:     Mental Status: He is alert.     Vital Signs: BP (!) 144/63 (BP Location: Right Arm)   Pulse 77   Temp 98.2 F (36.8 C) (Oral)   Resp (!) 28   Ht 5\' 9"  (1.753 m)   Wt 67.7 kg   SpO2 94%   BMI 22.03 kg/m  Pain Scale: 0-10 POSS *See Group Information*: S-Acceptable,Sleep, easy to arouse Pain Score: 0-No pain   SpO2: SpO2: 94 % O2 Device:SpO2: 94 % O2 Flow Rate: .O2  Flow Rate (L/min): 2 L/min  IO: Intake/output summary:   Intake/Output Summary (Last 24 hours) at 07/25/2020 1426 Last data filed at 07/25/2020 1356 Gross per 24 hour  Intake 360 ml  Output 1300 ml  Net -940 ml    LBM: Last BM Date: 07/24/20 Baseline Weight: Weight: 69.4 kg Most recent weight: Weight: 67.7 kg     Palliative Assessment/Data:     Time In: 12:40 Time Out: 1:40 Time Total: 60 min Greater than 50%  of this time was spent counseling and coordinating care related to the above assessment and plan.  Signed by: 14/05/21, NP   Please contact Palliative Medicine Team phone at [phone] for questions and concerns.  For individual provider: See [phone]

## 2020-07-26 DIAGNOSIS — I5033 Acute on chronic diastolic (congestive) heart failure: Secondary | ICD-10-CM | POA: Diagnosis not present

## 2020-07-26 DIAGNOSIS — Z7189 Other specified counseling: Secondary | ICD-10-CM

## 2020-07-26 LAB — CBC
HCT: 31.4 % — ABNORMAL LOW (ref 39.0–52.0)
Hemoglobin: 11.1 g/dL — ABNORMAL LOW (ref 13.0–17.0)
MCH: 30.7 pg (ref 26.0–34.0)
MCHC: 35.4 g/dL (ref 30.0–36.0)
MCV: 87 fL (ref 80.0–100.0)
Platelets: 339 10*3/uL (ref 150–400)
RBC: 3.61 MIL/uL — ABNORMAL LOW (ref 4.22–5.81)
RDW: 13.8 % (ref 11.5–15.5)
WBC: 14.5 10*3/uL — ABNORMAL HIGH (ref 4.0–10.5)
nRBC: 0 % (ref 0.0–0.2)

## 2020-07-26 MED ORDER — PREDNISONE 20 MG PO TABS
30.0000 mg | ORAL_TABLET | Freq: Every day | ORAL | Status: DC
  Filled 2020-07-26: qty 2

## 2020-07-26 MED ORDER — PREDNISONE 20 MG PO TABS
10.0000 mg | ORAL_TABLET | Freq: Every day | ORAL | Status: AC
  Administered 2020-07-29: 10 mg via ORAL
  Filled 2020-07-26: qty 1

## 2020-07-26 MED ORDER — PREDNISONE 20 MG PO TABS: 10.0000 mg | ORAL_TABLET | Freq: Every day | ORAL | Status: DC

## 2020-07-26 MED ORDER — PREDNISONE 20 MG PO TABS
30.0000 mg | ORAL_TABLET | Freq: Every day | ORAL | Status: DC
Start: 2020-07-27 — End: 2020-07-26

## 2020-07-26 MED ORDER — LOPERAMIDE HCL 2 MG PO CAPS
2.0000 mg | ORAL_CAPSULE | Freq: Four times a day (QID) | ORAL | Status: DC | PRN
  Administered 2020-07-26: 2 mg via ORAL
  Filled 2020-07-26: qty 1

## 2020-07-26 MED ORDER — PREDNISONE 20 MG PO TABS
30.0000 mg | ORAL_TABLET | Freq: Every day | ORAL | Status: AC
  Administered 2020-07-27: 30 mg via ORAL
  Filled 2020-07-26: qty 2

## 2020-07-26 MED ORDER — AMLODIPINE BESYLATE 5 MG PO TABS
5.0000 mg | ORAL_TABLET | Freq: Every day | ORAL | Status: DC
  Administered 2020-07-26 – 2020-07-27 (×2): 5 mg via ORAL
  Filled 2020-07-26 (×2): qty 1

## 2020-07-26 MED ORDER — PREDNISONE 20 MG PO TABS
20.0000 mg | ORAL_TABLET | Freq: Every day | ORAL | Status: AC
  Administered 2020-07-28: 20 mg via ORAL
  Filled 2020-07-26: qty 1

## 2020-07-26 MED ORDER — LIVING BETTER WITH HEART FAILURE BOOK: Freq: Once | Status: DC

## 2020-07-26 MED ORDER — PREDNISONE 20 MG PO TABS: 20.0000 mg | ORAL_TABLET | Freq: Every day | ORAL | Status: DC

## 2020-07-26 NOTE — Care Management Important Message (Signed)
Important Message  Patient Details  Name: Martin Bean MRN: [id number] Date of Birth: October 21, 1929   Medicare Important Message Given:  Yes     [name] 07/26/2020, 3:34 PM

## 2020-07-26 NOTE — Progress Notes (Addendum)
Daily Progress Note   Patient Name: Martin Bean       Date: 07/26/2020 DOB: October 28, 1929  Age: 86 y.o. MRN#: [id number] Attending Physician: Enedina Finner, MD Primary Care Physician: Gracelyn Nurse, MD Admit Date: 07/17/2020  Reason for Consultation/Follow-up: Establishing goals of care  Subjective: Patient is resting in bed. Daughter is at bedside. Patient confirms the hierarchy for HPOA and explains his rationale. Discussed a comfort path vs continued aggressive care. He states he is ready to go when the [facility] calls him. He states when he asked the Lord to take him home last week, it was because it felt so poorly. He states he is still ready if he calls at any time, but presently feels good enough to go to rehab to try to  increase strength. He states he would not want to return to the hospital, and would just want to be made comfortable if he develops PNA again. Plans for D/C to rehab with palliative. Shift to hospice when patient desires.    Length of Stay: 9  Current Medications: Scheduled Meds:  . albuterol  2.5 mg Nebulization TID  . amLODipine  5 mg Oral Daily  . aspirin EC  81 mg Oral Daily  . enoxaparin (LOVENOX) injection  40 mg Subcutaneous Q24H  . fluticasone furoate-vilanterol  1 puff Inhalation Daily   And  . umeclidinium bromide  1 puff Inhalation Daily  . furosemide  20 mg Oral Daily  . guaiFENesin  600 mg Oral Daily  . Living Better with Heart Failure Book   Does not apply Once  . pantoprazole  40 mg Oral Daily  . [START ON 07/27/2020] predniSONE  30 mg Oral Q breakfast   Followed by  . [START ON 07/28/2020] predniSONE  20 mg Oral Q breakfast   Followed by  . [START ON 07/29/2020] predniSONE  10 mg Oral Q breakfast  . sodium chloride flush  3 mL Intravenous Q12H  .  sodium chloride  1 g Oral TID WC  . tamsulosin  0.4 mg Oral Daily    Continuous Infusions: . sodium chloride      PRN Meds: sodium chloride, acetaminophen, guaiFENesin, labetalol, ondansetron (ZOFRAN) IV, oxyCODONE, sodium chloride flush  Physical Exam          Vital Signs: BP 128/70 (BP Location: Left  Arm)   Pulse 77   Temp 97.6 F (36.4 C) (Oral)   Resp 20   Ht 5\' 9"  (1.753 m)   Wt 62.2 kg   SpO2 93%   BMI 20.25 kg/m  SpO2: SpO2: 93 % O2 Device: O2 Device: Room Air O2 Flow Rate: O2 Flow Rate (L/min): 1 L/min  Intake/output summary:   Intake/Output Summary (Last 24 hours) at 07/26/2020 1308 Last data filed at 07/26/2020 0057 Gross per 24 hour  Intake 120 ml  Output 400 ml  Net -280 ml   LBM: Last BM Date: 07/25/20 Baseline Weight: Weight: 69.4 kg Most recent weight: Weight: 62.2 kg       Palliative Assessment/Data: 40%      Patient Active Problem List   Diagnosis Date Noted  . Pain of left lower extremity   . COPD with acute exacerbation (HCC)   . Acute respiratory failure with hypoxia (HCC)   . Weakness   . Acute on chronic diastolic CHF (congestive heart failure) (HCC) 07/17/2020  . Hyponatremia 07/17/2020  . Hypertensive urgency 07/17/2020  . Bilateral pneumonia 01/17/2019  . CAP (community acquired pneumonia) 12/28/2018  . Acute renal failure (ARF) (HCC) 12/28/2018  . Multifocal pneumonia 12/28/2018  . Hemothorax   . Malnutrition of moderate degree 11/02/2017  . Hydropneumothorax 11/01/2017  . Essential hypertension 10/09/2017  . Hyperlipidemia 10/09/2017  . Carotid arterial disease (HCC) 10/02/2017  . Carotid artery stenosis, unilateral, left 09/04/2017  . CVA (cerebral vascular accident) (HCC) 08/14/2017  . Benign prostatic hyperplasia with urinary frequency 06/03/2017  . Depression, prolonged 06/03/2017  . 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 Psoriasis, unspecified 06/03/2017    Palliative Care Assessment & Plan    Recommendations/Plan:  D/C with palliative. Transition to hospice when patient is ready.     Code Status:    Code Status Orders  (From admission, onward)         Start     Ordered   07/18/20 1537  Do not attempt resuscitation (DNR)  Continuous       Question Answer Comment  In the event of cardiac or respiratory ARREST Do not call a "code blue"   In the event of cardiac or respiratory ARREST Do not perform Intubation, CPR, defibrillation or ACLS   In the event of cardiac or respiratory ARREST Use medication by any route, position, wound care, and other measures to relive pain and suffering. May use oxygen, suction and manual treatment of airway obstruction as needed for comfort.      07/18/20 1536        Code Status History    Date Active Date Inactive Code Status Order ID Comments User Context   07/17/2020 1958 07/18/2020 1536 Full Code 07/20/2020  [id number], MD ED   01/17/2019 1644 01/20/2019 2103 Full Code 2104  [id number], MD ED   12/28/2018 1840 12/30/2018 1913 Full Code 03/01/2019  [id number], MD ED   11/01/2017 1225 11/06/2017 1702 Full Code 11/08/2017  [id number], MD Inpatient   10/02/2017 1525 10/03/2017 1756 Full Code 10/05/2017  [id number], MD Inpatient   08/14/2017 0935 08/15/2017 2214 Full Code 2215  [id number], MD ED   Advance Care Planning Activity    Advance Directive Documentation     Most Recent Value  Type of Advance Directive Living will  Pre-existing out of facility DNR order (yellow form or pink MOST form) --  "MOST" Form in Place? --       Prognosis:  Poor overall.  Thank you for allowing the Palliative Medicine Team to assist in the care of this patient.   Total Time 35 min Prolonged Time Billed no      Greater than 50%  of this time was spent counseling and coordinating care related to the above assessment and plan.  Morton Stall, NP  Please contact Palliative Medicine Team phone at [phone] for  questions and concerns.

## 2020-07-26 NOTE — Progress Notes (Signed)
Occupational Therapy Treatment Patient Details Name: Martin Bean MRN: [id number] DOB: 1930-03-04 Today's Date: 07/26/2020    History of present illness 86 y.o. male with medical history significant for history of CVA, hypertension, chronic diastolic CHF, and depression, now presenting to the emergency department for evaluation of shortness of breath, cough, nausea, and general malaise.  Patient is accompanied by his daughter who assists with the history.  Patient has seemed to have some general weakness and fatigue for the past 1 to 2 weeks per report of his daughter.  Patient reports that he has developed some nausea, shortness of breath, and productive cough over the past couple days.  He denies any fevers, chills, chest pain, or palpitations.  He has slept in a recliner for years and is unable to comment on orthopnea.  He reports some bilateral lower leg swelling that he describes as chronic.  He had not eaten much today, but denies any vomiting, diarrhea, or loss of appetite until today.  Patient denies any headache and his daughter has not noticed any confusion. He denies difficulty swallowing or choking or coughing when trying to eat or drink.   OT comments  Pt seen for OT tx this date to f/u re: safety with ADLs/ADL mobility. While pt and family in ongoing talks with palliative care team regarding goals of care, pt seen by OT this date at MD and pt's daughter's request. Pt is noted to be soiled and engaged in grooming, bathing, dressing and toileting-skills tasks to remove soiled bedding and help pt to clean himself for improved tolerance and skin integrity. Pt requires MIN A to come to EOB sitting with HOB elevated. Demos F static sitting balance EOB. OT engages pt in UB bathing/dressing with MIN/MOD A. Pt requires MIN/MOD A for ADL transfer to standing from elevated EOB with RW. Demos F static standing balance with B UE support. Pt requires MAX A for posterior peri care and LB bathing in  standing. Bedding changed with assist of pt's daughter and remainder of bathing completed bed level with MAX A as pt is fatigued from sitting/standing. He continues to demo decreased fxl activity tolerance. In addition, gives conflicting reports re: his goals including "I would like to be able to walk again" but also "I'm just so tired and want to be comfortable". Implemented therapeutic listening with pt and family member-daughter from [facility] present throughout-and described differences from a therapy standpoint. Will continue to follow at this time and await guidance from palliative.     Follow Up Recommendations  SNF;Supervision/Assistance - 24 hour    Equipment Recommendations  Other (comment) (defer to next level of care, imagine pt will need hospital bed if he's to d/c home.)    Recommendations for Other Services      Precautions / Restrictions Precautions Precautions: Fall Restrictions Weight Bearing Restrictions: No       Mobility Bed Mobility Overal bed mobility: Needs Assistance Bed Mobility: Supine to Sit     Supine to sit: Min assist Sit to supine: Mod assist   General bed mobility comments: MOD A for LE assist for back to bed  Transfers Overall transfer level: Needs assistance Equipment used: Rolling walker (2 wheeled) Transfers: Sit to/from Stand Sit to Stand: Min assist;Mod assist;From elevated surface         General transfer comment: sit to stand with RW from ~3-4 inch elevated bed surface to CTS this date. Endorses fatigue which could be contributing to increased need for assist versus last PT  session.    Balance Overall balance assessment: Needs assistance Sitting-balance support: Feet supported Sitting balance-Leahy Scale: Fair Sitting balance - Comments: utilizes UE support to sustain static sit pretty regularly throughout session     Standing balance-Leahy Scale: Fair Standing balance comment: requires UE support on RW and external support of CGA-MIN  A to sustain static stand.                           ADL either performed or assessed with clinical judgement   ADL Overall ADL's : Needs assistance/impaired     Grooming: Minimal assistance;Sitting;Applying deodorant Grooming Details (indicate cue type and reason): to apply deodorant seated EOB with F static sitting balance Upper Body Bathing: Minimal assistance;Moderate assistance;Sitting Upper Body Bathing Details (indicate cue type and reason): for thorough completion and cues to sequence Lower Body Bathing: Maximal assistance;Sit to/from stand Lower Body Bathing Details (indicate cue type and reason): MIN/MOD A to stand from elevated surface with RW, MAX A to complete actual functional task. Pt fatigues and entirety of task cannot be completed in standing, remainder completed bed level using rolling technique with MAX A. Upper Body Dressing : Minimal assistance;Moderate assistance;Cueing for sequencing;Sitting   Lower Body Dressing: Maximal assistance;Sitting/lateral leans       Toileting- Clothing Manipulation and Hygiene: Maximal assistance;Sit to/from stand Toileting - Clothing Manipulation Details (indicate cue type and reason): pt with episode of bowel incontinence with attempts to mobilize, requires MIN/MOD A for sit to stand with RW from EOB elevated surface. MAX A to complete posterior peri care in standing as he requires use of both UEs to maintain static stand on RW.             Vision Baseline Vision/History: Wears glasses Wears Glasses: At all times Patient Visual Report: No change from baseline     Perception     Praxis      Cognition Arousal/Alertness: Awake/alert Behavior During Therapy: WFL for tasks assessed/performed Overall Cognitive Status: Within Functional Limits for tasks assessed                                 General Comments: hard of hearing        Exercises Other Exercises Other Exercises: OT facilitates ed  with pt and family member re rehab services versus their potential discussion with palliative today re: hospice. Pt with somewhat conflicting comments including "I would like to be able to walk again" but also "I'm just so tired and want to be comfortable". Implemented therapeutic listening with pt and family member-daughter from [facility] present throughout-and described differences from a therapy standpoint. Will continue to follow at this time and await guidance from palliative. Other Exercises: OT facilitates pt participation in UB bathing/dressing with MIN/MOD A in sitting and LB bathing/peri care and dressing with MAX A using sit<>stand method (MIN/MOD A for sit to stand with RW from elevated bed surface.   Shoulder Instructions       General Comments      Pertinent Vitals/ Pain       Pain Assessment: Faces Faces Pain Scale: Hurts a little bit Pain Location: L thigh Pain Descriptors / Indicators: Tender Pain Intervention(s): Limited activity within patient's tolerance;Monitored during session  Home Living  Prior Functioning/Environment              Frequency  Min 1X/week        Progress Toward Goals  OT Goals(current goals can now be found in the care plan section)  Progress towards OT goals: Progressing toward goals;OT to reassess next treatment (pt with improvement over OT evaluation, but slightly increased need for assist versus PT tx 4 days ago, pt endorses fatigue this date.)  Acute Rehab OT Goals Patient Stated Goal: to get better, eventually return to [facility] OT Goal Formulation: With patient Time For Goal Achievement: 08/02/20 Potential to Achieve Goals: Good  Plan Discharge plan remains appropriate    Co-evaluation                 AM-PAC OT "6 Clicks" Daily Activity     Outcome Measure   Help from another person eating meals?: None Help from another person taking care of personal  grooming?: A Little Help from another person toileting, which includes using toliet, bedpan, or urinal?: A Lot Help from another person bathing (including washing, rinsing, drying)?: A Lot Help from another person to put on and taking off regular upper body clothing?: A Lot Help from another person to put on and taking off regular lower body clothing?: A Lot 6 Click Score: 15    End of Session Equipment Utilized During Treatment: Gait belt;Rolling walker  OT Visit Diagnosis: Unsteadiness on feet (R26.81);Muscle weakness (generalized) (M62.81) (SNF recommended at this time, but discussion between pt, family and palliative care team to follow this date to further assess goals of care)   Activity Tolerance Patient limited by fatigue   Patient Left in bed;with call bell/phone within reach;with bed alarm set;with family/visitor present   Nurse Communication Mobility status;Other (comment) (notified RN of large loose bowel movement)        Time: 4709-6283 OT Time Calculation (min): 42 min  Charges: OT General Charges $OT Visit: 1 Visit OT Treatments $Self Care/Home Management : 23-37 mins $Therapeutic Activity: 8-22 mins  Rejeana Brock, MS, OTR/L ascom [phone] 07/26/20, 10:36 AM

## 2020-07-26 NOTE — Progress Notes (Signed)
[facility]  - [place] at [facility]   PATIENT NAME: Martin Bean    MR#:  [id number]  DATE OF BIRTH:  Oct 31, 1929  SUBJECTIVE:  Very weak and deconditioned.  Patient very hard on hearing.  Breathing has improved. Daughter from [place] is in the room. She feels patient has gotten out of bed for few days. Requesting if PT/OT can see  Patient ate good breakfast. Denies any symptoms. Family to meet with palliative care this morning  REVIEW OF SYSTEMS:   Review of Systems  Constitutional: Negative for chills, fever and weight loss.  HENT: Negative for ear discharge, ear pain and nosebleeds.   Eyes: Negative for blurred vision, pain and discharge.  Respiratory: Negative for sputum production, shortness of breath, wheezing and stridor.   Cardiovascular: Negative for chest pain, palpitations, orthopnea and PND.  Gastrointestinal: Negative for abdominal pain, diarrhea, nausea and vomiting.  Genitourinary: Negative for frequency and urgency.  Musculoskeletal: Negative for back pain and joint pain.  Neurological: Positive for weakness. Negative for sensory change, speech change and focal weakness.  Psychiatric/Behavioral: Negative for depression and hallucinations. The patient is not nervous/anxious.    patient hard of hearing tolerating Diet: yes tolerating PT: rehab  DRUG ALLERGIES:  No Known Allergies  VITALS:  Blood pressure (!) 168/84, pulse 72, temperature (!) 97.4 F (36.3 C), temperature source Oral, resp. rate (!) 24, height 5\' 9"  (1.753 m), weight 62.2 kg, SpO2 94 %.  PHYSICAL EXAMINATION:   Physical Exam  GENERAL:  86 y.o.-year-old patient lying in the bed with no acute distress. Debilitated deconditioned HEENT: Head atraumatic, normocephalic. Oropharynx and nasopharynx clear.   LUNGS: distant breath sounds bilaterally, no wheezing, rales, rhonchi. No use of accessory muscles of respiration.  CARDIOVASCULAR: S1, S2 normal. No murmurs, rubs, or  gallops.  ABDOMEN: Soft, nontender, nondistended. Bowel sounds present. No organomegaly or mass.  EXTREMITIES: No cyanosis, clubbing or edema b/l.    NEUROLOGIC: generalized weakness. Grossly nonfocal.  PSYCHIATRIC:  patient is alert and awake.  SKIN: No obvious rash, lesion, or ulcer.   LABORATORY PANEL:  CBC Recent Labs  Lab 07/26/20 0502  WBC 14.5*  HGB 11.1*  HCT 31.4*  PLT 339    Chemistries  Recent Labs  Lab 07/23/20 0716 07/23/20 1704 07/24/20 0630 07/24/20 0942 07/25/20 0501  NA 124*   < > 132*   < > 132*  K 4.0  --  4.1  --   --   CL 89*  --  96*  --   --   CO2 25  --  27  --   --   GLUCOSE 156*  --  139*  --   --   BUN 20  --  29*  --   --   CREATININE 0.62  --  0.70  --   --   CALCIUM 8.7*  --  8.7*  --   --   AST 40  --   --   --   --   ALT 58*  --   --   --   --   ALKPHOS 72  --   --   --   --   BILITOT 0.7  --   --   --   --    < > = values in this interval not displayed.   Cardiac Enzymes No results for input(s): TROPONINI in the last 168 hours. RADIOLOGY:  No results found. ASSESSMENT AND PLAN:  Martin Bean a 86  y.o.malewith medical history significant forhistory of CVA, hypertension, chronic diastolic CHF, and depression, now presenting to the emergency department for evaluation of shortness of breath, cough, nausea, and general malaise. Patient is accompanied by his daughter who assists with the history.   1.  Acute hypoxic respiratory failure secondary to CAP  COPD exacerbation -- Does not use any oxygen at home.  -- Patient initially was hypoxic with pulse ox of 89% on 1 L. Now weaned off to room air. Sats are 99% --Procalcitonin unremarkable.   -- Seen by SLP.  They advanced him to dysphagia 2 diet. He seems to be pocketing food--at risk for Aspiration -- remains afebrile  -- leukocytosis improved. -- Patient completed his antibiotic course. -- Switch to oral prednisone taper. Continue nebulizer and inhalers -prednisone  taper  2. Hyponatremia due to SIADH: Presented with sodium of 122 which initially dropped and then now has started to improve.  Continue fluid restriction  -- patient received a dose of Tolvaptan sodium improved to 132 -- good urine output. Nephrology following patient.  3. Hypertensive urgency: Patient presented with BP as high as 207/92 in ED.   -- continue Norvasc  4. Acute on chronic diastolic CHF: Patient was presumed to have acute on chronic diastolic CHF.  - He received 1 dose of Lasix.  He does not seem to have any acute exacerbation anymore.  He is not taking any Lasix at home. --  He looks euvolemic.  Monitor daily. --Echo with normal ejection fraction, no diastolic dysfunction.  5. Depression  - Hold Zoloft in light of hyponatremia    6.  Hyperlipidemia: Continue atorvastatin.  7.  BPH: Continue Flomax.  8.  Generalized weakness: Patient looks significantly weak.  -- Physical therapy recommends rehab.  Palliative care consultation for goals of care, multiple comorbidities overall decline in health. Discussed with daughter at length regarding patient's multiple comorbidities. Patient overall has a poor long-term prognosis given recent decline  Palliative care to meet with family to discuss.   DVT prophylaxis: enoxaparin (LOVENOX)    Code Status: DNR  Family Communication:  spoke with patient's daughter at bedside.   Status is: Inpatient  Remains inpatient appropriate because:Inpatient level of care appropriate due to severity of illness   Dispo: The patient is from: Home  Anticipated d/c is to:TBD after palliative care discussion  Anticipated d/c date is: 1 to 2 days  Patient currently is  medically stable to d/c.  awaitng discussion with palliative care and rehab bed. Per TOC no beds offered so far     TOTAL TIME TAKING CARE OF THIS PATIENT: *20* minutes.  >50% time spent on counselling and coordination of  care  Note: This dictation was prepared with Dragon dictation along with smaller phrase technology. Any transcriptional errors that result from this process are unintentional.  Enedina Finner M.D    [facility]   CC: Primary care physician; Martin Bean, MDPatient ID: Martin Bean, male   DOB: 20-Nov-1929, 86 y.o.   MRN: [id number]

## 2020-07-26 NOTE — TOC Progression Note (Addendum)
Transition of Care [facility]) - Progression Note    Patient Details  Name: Martin Bean MRN: [id number] Date of Birth: 04-27-1930  [facility] CM/SW Contact  Chapman Fitch, RN Phone Number: 07/26/2020, 3:38 PM  Clinical Narrative:     Patient wishes to go to rehab with outpatient palliative at discharge.  Bed Offers presented. Patient and family have accepted bed at [facility].  Verlon Au with [facility] to start auth  Patient to be followed by Outpatient palliative at discharge   Expected Discharge Plan: Skilled Nursing Facility Barriers to Discharge: Continued Medical Work up  Expected Discharge Plan and Services Expected Discharge Plan: Skilled Nursing Facility In-house Referral: Clinical Social Work   Post Acute Care Choice: Skilled Nursing Facility Living arrangements for the past 2 months: Assisted Living Facility ([facility])                                       Social Determinants of Health (SDOH) Interventions    Readmission Risk Interventions Readmission Risk Prevention Plan 01/19/2019  Transportation Screening Complete  PCP or Specialist Appt within 5-7 Days Complete  Home Care Screening Complete  Medication Review (RN CM) Complete  Some recent data might be hidden

## 2020-07-26 NOTE — Progress Notes (Signed)
Mobility Specialist - Progress Note   07/26/20 1524  Mobility  Activity Dangled on edge of bed (S2S x 2, 3 L lateral steps along bedside)  Level of Assistance Minimal assist, patient does 75% or more  Assistive Device Front wheel walker  Mobility Response Tolerated fair  Mobility performed by Mobility specialist  $Mobility charge 1 Mobility    Pre-mobility: 79 HR, 98% SpO2 During mobility: 83 HR, 93% SpO2 Post-mobility: 85 HR, 91% SpO2   Pt laying in bed w/ daughter present in room upon arrival. Pt agreed to session. Pt transitioned supine to sit EOB w/ mod. Assist for LE support. Pt dangled EOB for a couple of minutes. Pt S2S w/ min. Assist for boosting and steadying. VC required for hand placement and sequencing. While standing at bedside, noted pt had a BM and soiled his linens. Pt addressed to sit on EOB. NT notified. NT entered the room. Pt S2S to RW again w/ min. Assist. NT provided toilet hygiene and changed chuck pads, while pt standing at bedside. After getting cleaned, pt able to take ~3 L lateral steps towards HOB w/ min. Assist. Pt needed min. Assist transitioning from sit to supine in bed. Overall, pt tolerated session fair. Pt HOH. Pt unable to follow some commands. Required heavy encouragement t/o session. Pt left laying in bed w/ alarm set. All needs placed in reach. Nurse was notified.     Gearl Baratta Mobility Specialist  07/26/20, 3:39 PM

## 2020-07-27 MED ORDER — ENSURE ENLIVE PO LIQD
237.0000 mL | Freq: Two times a day (BID) | ORAL | Status: DC
  Administered 2020-07-27 – 2020-07-29 (×5): 237 mL via ORAL

## 2020-07-27 MED ORDER — ALBUTEROL SULFATE (2.5 MG/3ML) 0.083% IN NEBU
2.5000 mg | INHALATION_SOLUTION | Freq: Two times a day (BID) | RESPIRATORY_TRACT | Status: DC
  Administered 2020-07-27 – 2020-07-28 (×2): 2.5 mg via RESPIRATORY_TRACT
  Filled 2020-07-27 (×2): qty 3

## 2020-07-27 MED ORDER — ADULT MULTIVITAMIN W/MINERALS CH
1.0000 | ORAL_TABLET | Freq: Every day | ORAL | Status: DC
  Administered 2020-07-28 – 2020-07-29 (×2): 1 via ORAL
  Filled 2020-07-27 (×2): qty 1

## 2020-07-27 MED ORDER — AMLODIPINE BESYLATE 5 MG PO TABS: 2.5000 mg | ORAL_TABLET | Freq: Every day | ORAL | Status: DC

## 2020-07-27 MED ORDER — DICLOFENAC SODIUM 1 % EX GEL
2.0000 g | Freq: Four times a day (QID) | CUTANEOUS | Status: DC
  Administered 2020-07-27 – 2020-07-29 (×7): 2 g via TOPICAL
  Filled 2020-07-27: qty 100

## 2020-07-27 NOTE — Progress Notes (Signed)
Patient has started complaining of left knee pain. NP notified after Tylenol and Oxycodone did not provide any relief. Voltaren gel ordered for pain relief.

## 2020-07-27 NOTE — TOC Progression Note (Signed)
Transition of Care [facility]) - Progression Note    Patient Details  Name: MARKIAN GLOCKNER MRN: [id number] Date of Birth: 1930/01/14  Transition of Care [facility]) CM/SW Contact  Chapman Fitch, RN Phone Number: 07/27/2020, 3:48 PM  Clinical Narrative:     Clinical sent to [facility] Verlon Au has started insurance auth    Expected Discharge Plan: Skilled Nursing Facility Barriers to Discharge: Continued Medical Work up  Expected Discharge Plan and Services Expected Discharge Plan: Skilled Nursing Facility In-house Referral: Clinical Social Work   Post Acute Care Choice: Skilled Nursing Facility Living arrangements for the past 2 months: Assisted Living Facility ([facility])                                       Social Determinants of Health (SDOH) Interventions    Readmission Risk Interventions Readmission Risk Prevention Plan 01/19/2019  Transportation Screening Complete  PCP or Specialist Appt within 5-7 Days Complete  Home Care Screening Complete  Medication Review (RN CM) Complete  Some recent data might be hidden

## 2020-07-27 NOTE — Progress Notes (Signed)
Patient ID: Martin Bean, male   DOB: 03-28-1930, 86 y.o.   MRN: [id number] [facility] PROGRESS NOTE  Martin Bean TDD:[id number] DOB: 08/19/1930 DOA: 07/17/2020 PCP: Gracelyn Nurse, MD  HPI/Subjective: Patient feeling okay.  Still with a little pain in his left leg but he states a cream was helpful for him.  Sodium better.  Still appetite not the greatest.  Breathing better.  Daughter states that patient does get short of breath if he gets anxious or starts to do anything.  Objective: Vitals:   07/27/20 0740 07/27/20 1122  BP: (!) 172/65 108/61  Pulse: 73 72  Resp:  20  Temp:  97.9 F (36.6 C)  SpO2:  93%    Intake/Output Summary (Last 24 hours) at 07/27/2020 1531 Last data filed at 07/27/2020 1413 Gross per 24 hour  Intake 243 ml  Output 500 ml  Net -257 ml   Filed Weights   07/25/20 0339 07/26/20 0500 07/27/20 0500  Weight: 67.7 kg 62.2 kg 67.5 kg    ROS: Review of Systems  Respiratory: Negative for cough and shortness of breath.   Cardiovascular: Negative for chest pain.  Gastrointestinal: Negative for abdominal pain, nausea and vomiting.   Exam: Physical Exam HENT:     Head: Normocephalic.     Mouth/Throat:     Pharynx: No oropharyngeal exudate.  Eyes:     General: Lids are normal.     Conjunctiva/sclera: Conjunctivae normal.     Pupils: Pupils are equal, round, and reactive to light.  Cardiovascular:     Rate and Rhythm: Normal rate and regular rhythm.     Heart sounds: S1 normal and S2 normal. Murmur heard.  Systolic murmur is present with a grade of 2/6.   Pulmonary:     Breath sounds: Normal breath sounds. No decreased breath sounds, wheezing, rhonchi or rales.  Abdominal:     Palpations: Abdomen is soft.     Tenderness: There is no abdominal tenderness.  Musculoskeletal:     Right ankle: No swelling.     Left ankle: No swelling.  Skin:    General: Skin is warm.     Findings: No rash.  Neurological:     Mental Status: He is alert and  oriented to person, place, and time.       Data Reviewed: Basic Metabolic Panel: Recent Labs  Lab 07/21/20 0620 07/21/20 0620 07/22/20 0731 07/22/20 0731 07/23/20 0617 07/23/20 0617 07/23/20 0716 07/23/20 0716 07/23/20 1704 07/24/20 0049 07/24/20 0630 07/24/20 0942 07/25/20 0501  NA 121*   < > 123*   < > 123*   < > 124*   < > 130* 130* 132* 129* 132*  K 3.9  --  4.1  --  3.9  --  4.0  --   --   --  4.1  --   --   CL 88*  --  89*  --  88*  --  89*  --   --   --  96*  --   --   CO2 23  --  24  --  25  --  25  --   --   --  27  --   --   GLUCOSE 156*  --  198*  --  161*  --  156*  --   --   --  139*  --   --   BUN 14  --  12  --  21  --  20  --   --   --  29*  --   --   CREATININE 0.54*  --  0.57*  --  0.60*  --  0.62  --   --   --  0.70  --   --   CALCIUM 8.6*  --  8.6*  --  8.7*  --  8.7*  --   --   --  8.7*  --   --    < > = values in this interval not displayed.   Liver Function Tests: Recent Labs  Lab 07/23/20 0716  AST 40  ALT 58*  ALKPHOS 72  BILITOT 0.7  PROT 6.2*  ALBUMIN 2.9*   CBC: Recent Labs  Lab 07/23/20 0612 07/26/20 0502  WBC 13.1* 14.5*  HGB 10.4* 11.1*  HCT 29.4* 31.4*  MCV 86.0 87.0  PLT 322 339   Cardiac Enzymes: Recent Labs  Lab 07/23/20 0716  CKTOTAL 32*   BNP (last 3 results) Recent Labs    07/17/20 1717  BNP 202.0*      Recent Results (from the past 240 hour(s))  Resp Panel by RT-PCR (Flu A&B, Covid) Nasopharyngeal Swab     Status: None   Collection Time: 07/17/20  6:26 PM   Specimen: Nasopharyngeal Swab; Nasopharyngeal(NP) swabs in vial transport medium  Result Value Ref Range Status   SARS Coronavirus 2 by RT PCR NEGATIVE NEGATIVE Final    Comment: (NOTE) SARS-CoV-2 target nucleic acids are NOT DETECTED.  The SARS-CoV-2 RNA is generally detectable in upper respiratory specimens during the acute phase of infection. The lowest concentration of SARS-CoV-2 viral copies this assay can detect is 138 copies/mL. A  negative result does not preclude SARS-Cov-2 infection and should not be used as the sole basis for treatment or other patient management decisions. A negative result may occur with  improper specimen collection/handling, submission of specimen other than nasopharyngeal swab, presence of viral mutation(s) within the areas targeted by this assay, and inadequate number of viral copies(<138 copies/mL). A negative result must be combined with clinical observations, patient history, and epidemiological information. The expected result is Negative.  Fact Sheet for Patients:  BloggerCourse.com  Fact Sheet for Healthcare Providers:  SeriousBroker.it  This test is no t yet approved or cleared by the [place] FDA and  has been authorized for detection and/or diagnosis of SARS-CoV-2 by FDA under an Emergency Use Authorization (EUA). This EUA will remain  in effect (meaning this test can be used) for the duration of the COVID-19 declaration under Section 564(b)(1) of the Act, 21 U.S.C.section 360bbb-3(b)(1), unless the authorization is terminated  or revoked sooner.       Influenza A by PCR NEGATIVE NEGATIVE Final   Influenza B by PCR NEGATIVE NEGATIVE Final    Comment: (NOTE) The Xpert Xpress SARS-CoV-2/FLU/RSV plus assay is intended as an aid in the diagnosis of influenza from Nasopharyngeal swab specimens and should not be used as a sole basis for treatment. Nasal washings and aspirates are unacceptable for Xpert Xpress SARS-CoV-2/FLU/RSV testing.  Fact Sheet for Patients: BloggerCourse.com  Fact Sheet for Healthcare Providers: SeriousBroker.it  This test is not yet approved or cleared by the [place] FDA and has been authorized for detection and/or diagnosis of SARS-CoV-2 by FDA under an Emergency Use Authorization (EUA). This EUA will remain in effect (meaning this test can  be used) for the duration of the COVID-19 declaration under Section 564(b)(1) of the Act, 21 U.S.C. section 360bbb-3(b)(1), unless the authorization is terminated or revoked.  Performed at [facility], [id number]  [address] [place], [place] [address]   Blood culture (routine x 2)     Status: None   Collection Time: 07/17/20  7:52 PM   Specimen: BLOOD  Result Value Ref Range Status   Specimen Description BLOOD RIGHT ANTECUBITAL  Final   Special Requests   Final    BOTTLES DRAWN AEROBIC AND ANAEROBIC Blood Culture results may not be optimal due to an excessive volume of blood received in culture bottles   Culture   Final    NO GROWTH 5 DAYS Performed at [facility], [address] [place], [place] [address]    Report Status 07/22/2020 FINAL  Final  Blood culture (routine x 2)     Status: None   Collection Time: 07/17/20  7:52 PM   Specimen: BLOOD  Result Value Ref Range Status   Specimen Description BLOOD BLOOD LEFT FOREARM  Final   Special Requests   Final    BOTTLES DRAWN AEROBIC AND ANAEROBIC Blood Culture adequate volume   Culture   Final    NO GROWTH 5 DAYS Performed at [facility], [address] [place], [place] [address]    Report Status 07/22/2020 FINAL  Final      Scheduled Meds: . albuterol  2.5 mg Nebulization BID  . amLODipine  5 mg Oral Daily  . aspirin EC  81 mg Oral Daily  . diclofenac Sodium  2 g Topical QID  . enoxaparin (LOVENOX) injection  40 mg Subcutaneous Q24H  . feeding supplement  237 mL Oral BID BM  . fluticasone furoate-vilanterol  1 puff Inhalation Daily   And  . umeclidinium bromide  1 puff Inhalation Daily  . furosemide  20 mg Oral Daily  . guaiFENesin  600 mg Oral Daily  . Living Better with Heart Failure Book   Does not apply Once  . [START ON 07/28/2020] multivitamin with minerals  1 tablet Oral Daily  . pantoprazole  40 mg Oral Daily  . [START ON 07/28/2020] predniSONE  20 mg Oral Q breakfast   Followed by   . [START ON 07/29/2020] predniSONE  10 mg Oral Q breakfast  . sodium chloride flush  3 mL Intravenous Q12H  . sodium chloride  1 g Oral TID WC  . tamsulosin  0.4 mg Oral Daily   Continuous Infusions: . sodium chloride      Assessment/Plan:  1. COPD with exacerbation.  Lungs sound better today.  On prednisone taper.  Continue nebulizer treatments. 2. Multifocal pneumonia seen on CT scan.  Patient finished up antibiotics during the hospital course. 3. Severe hyponatremia secondary to SIADH improved with tolvaptan and we will continue low-dose Lasix and salt tablets and regular diet.  Last sodium up to 132. 4. Essential hypertension.  Patient had a hypertensive urgency in the emergency room.  Patient on low-dose Norvasc.  Blood pressure variable during the hospital course. 5. Hyperlipidemia unspecified on atorvastatin 6. BPH on Flomax 7. Weakness.  Physical therapy recommends rehab.  Awaiting insurance authorization. 8. Acute hypoxic respiratory failure has resolved and patient is off oxygen 9. Leg pain improved with diclofenac gel.      Code Status:     Code Status Orders  (From admission, onward)         Start     Ordered   07/18/20 1537  Do not attempt resuscitation (DNR)  Continuous       Question Answer Comment  In the event of cardiac or respiratory ARREST Do not call a "  code blue"   In the event of cardiac or respiratory ARREST Do not perform Intubation, CPR, defibrillation or ACLS   In the event of cardiac or respiratory ARREST Use medication by any route, position, wound care, and other measures to relive pain and suffering. May use oxygen, suction and manual treatment of airway obstruction as needed for comfort.      07/18/20 1536        Code Status History    Date Active Date Inactive Code Status Order ID Comments User Context   07/17/2020 1958 07/18/2020 1536 Full Code [id number]  Briscoe Deutscher, MD ED   01/17/2019 1644 01/20/2019 2103 Full Code [id number]  Jama Flavors, MD ED   12/28/2018 1840 12/30/2018 1913 Full Code [id number]  Altamese Dilling, MD ED   11/01/2017 1225 11/06/2017 1702 Full Code [id number]  Ihor Austin, MD Inpatient   10/02/2017 1525 10/03/2017 1756 Full Code [id number]  Annice Needy, MD Inpatient   08/14/2017 0935 08/15/2017 2214 Full Code [id number]  Milagros Loll, MD ED   Advance Care Planning Activity    Advance Directive Documentation     Most Recent Value  Type of Advance Directive Living will  Pre-existing out of facility DNR order (yellow form or pink MOST form) --  "MOST" Form in Place? --     Family Communication: Family at bedside Disposition Plan: Status is: Inpatient  Dispo: The patient is from: Home              Anticipated d/c is to: Rehab              Anticipated d/c date is: Patient will be discharged as soon as insurance authorization for rehab happens              Patient currently medically stable for discharge  Time spent: 28 minutes  Bralyn Espino [employer]

## 2020-07-27 NOTE — Progress Notes (Signed)
Initial Nutrition Assessment  DOCUMENTATION CODES:   Severe malnutrition in context of chronic illness  INTERVENTION:   Ensure Enlive po BID, each supplement provides 350 kcal and 20 grams of protein  Magic cup TID with meals, each supplement provides 290 kcal and 9 grams of protein  MVI daily   NUTRITION DIAGNOSIS:   Severe Malnutrition related to chronic illness (COPD, CHF, advanced age) as evidenced by severe fat depletion, severe muscle depletion.  GOAL:   Patient will meet greater than or equal to 90% of their needs  MONITOR:   PO intake, Supplement acceptance, Labs, Weight trends, Skin, I & O's  REASON FOR ASSESSMENT:   LOS    ASSESSMENT:   86 y.o. male with medical history significant for history of COPD, CVA, hypertension, chronic diastolic CHF and depression who is admitted with CAP   Met with pt and pt's daughter in room today. Pt is hard of hearing so majority of history was obtained from daughter at bedside. Pt with fair appetite and oral intake at baseline. Per daughter's report, pt does enjoy chocolate Ensure at home. Pt documented to be eating 30-100% of meals in hospital; pt ate 100% of his breakfast today. RD will add supplements and MVI to help pt meet his estimated needs. Per chart, pt is weight stable at baseline. Palliative care following.   Medications reviewed and include: aspirin, lovenox, protonix, prednisone, NaCl tabs  Labs reviewed: Na 132(L) BUN 29(H)- 12/5 Wbc- 14.5(H)  NUTRITION - FOCUSED PHYSICAL EXAM:    Most Recent Value  Orbital Region Severe depletion  Upper Arm Region Severe depletion  Thoracic and Lumbar Region Severe depletion  Buccal Region Moderate depletion  Temple Region Severe depletion  Clavicle Bone Region Severe depletion  Clavicle and Acromion Bone Region Severe depletion  Scapular Bone Region Unable to assess  Dorsal Hand Severe depletion  Patellar Region Severe depletion  Anterior Thigh Region Severe depletion   Posterior Calf Region Severe depletion  Edema (RD Assessment) Mild  Hair Reviewed  Eyes Reviewed  Mouth Reviewed  Skin Reviewed  Nails Reviewed     Diet Order:   Diet Order            DIET - DYS 1 Room service appropriate? Yes with Assist; Fluid consistency: Thin  Diet effective now                EDUCATION NEEDS:   Education needs have been addressed  Skin:  Skin Assessment: Reviewed RN Assessment (ecchymosis)  Last BM:  12/6- type 7  Height:   Ht Readings from Last 1 Encounters:  07/18/20 _0  (1.753 m)    Weight:   Wt Readings from Last 1 Encounters:  07/27/20 67.5 kg    Ideal Body Weight:  72.7 kg  BMI:  Body mass index is 21.98 kg/m.  Estimated Nutritional Needs:   Kcal:  1800-2100kcal/day  Protein:  90-100g/day  Fluid:  1.7-2.0L/day  Koleen Distance MS, RD, LDN Please refer to [facility] for RD and/or RD on-call/weekend/after hours pager

## 2020-07-27 NOTE — Progress Notes (Signed)
Mobility Specialist - Progress Note   07/27/20 1122  Mobility  Activity  (seated exercises)  Level of Assistance Standby assist, set-up cues, supervision of patient - no hands on (cues for exercise)  Mobility Response Tolerated well  Mobility performed by Mobility specialist  $Mobility charge 1 Mobility    Pre-mobility: 76 HR, 93% SpO2 Post-mobility: 79 HR, 92% SpO2   Pt sitting on recliner w/ daughter present in room upon arrival. Pt agreed to session. Pt performed seated exercises on recliner: ankle pumps x 10, kicks x 10, and marches x 10. Further mobility deferred d/t fatigue from prior PT session. Overall, pt tolerated session well. Noted LLE is more limited in strength and ROM than RLE. Daughter states he usually c/o pain in LLE. Mobility specialist showed understanding. Pt remained in recliner at the end of session w/ all needs placed in reach. NT entered the room at the end of session. Nurse notified.     Martin Bean Mobility Specialist  07/27/20, 11:29 AM

## 2020-07-27 NOTE — Progress Notes (Signed)
Physical Therapy Treatment Patient Details Name: Martin Bean MRN: [id number] DOB: 03-Jun-1930 Today's Date: 07/27/2020    History of Present Illness 86 y.o. male with medical history significant for history of CVA, hypertension, chronic diastolic CHF, and depression, now presenting to the emergency department for evaluation of shortness of breath, cough, nausea, and general malaise.  Patient is accompanied by his daughter who assists with the history.  Patient has seemed to have some general weakness and fatigue for the past 1 to 2 weeks per report of his daughter.  Patient reports that he has developed some nausea, shortness of breath, and productive cough over the past couple days.  He denies any fevers, chills, chest pain, or palpitations.  He has slept in a recliner for years and is unable to comment on orthopnea.  He reports some bilateral lower leg swelling that he describes as chronic.  He had not eaten much today, but denies any vomiting, diarrhea, or loss of appetite until today.  Patient denies any headache and his daughter has not noticed any confusion. He denies difficulty swallowing or choking or coughing when trying to eat or drink.    PT Comments    Pt was long sitting in bed upon arriving. He agrees to PT session and is cooperative and pleasant throughout. He is alert during session and pleasantly confused. Able to consistently follow commands throughout and is willing to fully participate. Pt's condom cath not proper fitting with bed linens soaked with urine/BM. Pt was able to exit L side of bed, stand , and get to recliner with use of RW. Increased time to perform however pt tolerated well. He will greatly benefit from contniued skilled PT at DC  To improve safe functional mobility while assisting pt to PLOF. Recommend SNF for safe DC. Pt was sitting in recliner with call bell in reach and supportive daughter at bedside.    Follow Up Recommendations  SNF;Supervision/Assistance - 24  hour     Equipment Recommendations  Other (comment) (defer to next level of care)    Recommendations for Other Services       Precautions / Restrictions Precautions Precautions: Fall Restrictions Weight Bearing Restrictions: No    Mobility  Bed Mobility Overal bed mobility: Needs Assistance Bed Mobility: Supine to Sit     Supine to sit: Min assist;Mod assist;HOB elevated     General bed mobility comments: MIn-mod assist of one to exit L side of bed. INcreased time and max vcs for technique and sequencing  Transfers Overall transfer level: Needs assistance Equipment used: Rolling walker (2 wheeled) Transfers: Sit to/from Stand Sit to Stand: Min assist;Mod assist;From elevated surface         General transfer comment: Min assist to stand from elevated surface but mod assist to stand from lower surface height  Ambulation/Gait Ambulation/Gait assistance: Min assist Gait Distance (Feet): 3 Feet Assistive device: Rolling walker (2 wheeled) Gait Pattern/deviations: Step-to pattern Gait velocity: decreased   General Gait Details: Pt was able to take steps form EOB to recliner with Vcs for safety and sequencing       Balance Overall balance assessment: Needs assistance Sitting-balance support: Feet supported Sitting balance-Leahy Scale: Good Sitting balance - Comments: no LOB sitting EOB   Standing balance support: Bilateral upper extremity supported;During functional activity Standing balance-Leahy Scale: Fair Standing balance comment: requires UE support on RW and external support of CGA-MIN A to sustain static stand.       Cognition Arousal/Alertness: Awake/alert Behavior During Therapy: [facility]  for tasks assessed/performed Overall Cognitive Status: Within Functional Limits for tasks assessed    General Comments: Pt is A and pleasantly confused. oriented to self only but able to follow commands consistently and is motivated throughout              Pertinent Vitals/Pain Pain Assessment: 0-10 Pain Score: 4  Faces Pain Scale: Hurts little more Pain Location: L knee Pain Descriptors / Indicators: Tender Pain Intervention(s): Limited activity within patient's tolerance;Monitored during session;Premedicated before session;Repositioned           PT Goals (current goals can now be found in the care plan section) Acute Rehab PT Goals Patient Stated Goal: to get better, eventually return to [facility] Progress towards PT goals: Progressing toward goals    Frequency    Min 2X/week      PT Plan Current plan remains appropriate       AM-PAC PT "6 Clicks" Mobility   Outcome Measure  Help needed turning from your back to your side while in a flat bed without using bedrails?: A Little Help needed moving from lying on your back to sitting on the side of a flat bed without using bedrails?: A Little Help needed moving to and from a bed to a chair (including a wheelchair)?: A Little Help needed standing up from a chair using your arms (e.g., wheelchair or bedside chair)?: A Little Help needed to walk in hospital room?: A Little Help needed climbing 3-5 steps with a railing? : A Lot 6 Click Score: 17    End of Session   Activity Tolerance: Patient tolerated treatment well;Patient limited by fatigue Patient left: in chair;with call bell/phone within reach;with chair alarm set;with family/visitor present Nurse Communication: Mobility status PT Visit Diagnosis: Unsteadiness on feet (R26.81);Other abnormalities of gait and mobility (R26.89);Muscle weakness (generalized) (M62.81);History of falling (Z91.81);Difficulty in walking, not elsewhere classified (R26.2)     Time: 2774-1287 PT Time Calculation (min) (ACUTE ONLY): 33 min  Charges:  $Therapeutic Activity: 23-37 mins                     Jetta Lout PTA 07/27/20, 10:29 AM

## 2020-07-28 DIAGNOSIS — E43 Unspecified severe protein-calorie malnutrition: Secondary | ICD-10-CM | POA: Insufficient documentation

## 2020-07-28 LAB — BASIC METABOLIC PANEL
Anion gap: 7 (ref 5–15)
BUN: 32 mg/dL — ABNORMAL HIGH (ref 8–23)
CO2: 26 mmol/L (ref 22–32)
Calcium: 8.5 mg/dL — ABNORMAL LOW (ref 8.9–10.3)
Chloride: 96 mmol/L — ABNORMAL LOW (ref 98–111)
Creatinine, Ser: 0.68 mg/dL (ref 0.61–1.24)
GFR, Estimated: >60 mL/min (ref >60)
Glucose, Bld: 110 mg/dL — ABNORMAL HIGH (ref 70–99)
Potassium: 4.2 mmol/L (ref 3.5–5.1)
Sodium: 129 mmol/L — ABNORMAL LOW (ref 135–145)

## 2020-07-28 MED ORDER — AMLODIPINE BESYLATE 5 MG PO TABS
5.0000 mg | ORAL_TABLET | Freq: Every day | ORAL | Status: DC
  Administered 2020-07-28 – 2020-07-29 (×2): 5 mg via ORAL
  Filled 2020-07-28 (×2): qty 1

## 2020-07-28 MED ORDER — ALBUTEROL SULFATE (2.5 MG/3ML) 0.083% IN NEBU: 2.5000 mg | INHALATION_SOLUTION | Freq: Four times a day (QID) | RESPIRATORY_TRACT | Status: DC | PRN

## 2020-07-28 NOTE — TOC Progression Note (Addendum)
Transition of Care [facility]) - Progression Note    Patient Details  Name: Martin Bean MRN: [id number] Date of Birth: 04-28-1930  [facility]) CM/SW Contact  Chapman Fitch, RN Phone Number: 07/28/2020, 1:26 PM  Clinical Narrative:    Insurance auth still pending for SNF   Update:   Insurance auth received.  [facility] will not have a bed until tomorrow  Expected Discharge Plan: Skilled Nursing Facility Barriers to Discharge: Continued Medical Work up  Expected Discharge Plan and Services Expected Discharge Plan: Skilled Nursing Facility In-house Referral: Clinical Social Work   Post Acute Care Choice: Skilled Nursing Facility Living arrangements for the past 2 months: Assisted Living Facility ([facility])                                       Social Determinants of Health (SDOH) Interventions    Readmission Risk Interventions Readmission Risk Prevention Plan 01/19/2019  Transportation Screening Complete  PCP or Specialist Appt within 5-7 Days Complete  Home Care Screening Complete  Medication Review (RN CM) Complete  Some recent data might be hidden

## 2020-07-28 NOTE — Progress Notes (Signed)
SLP F/U Note  Patient Details Name: Martin Bean MRN: [id number] DOB: 22-Apr-1930   Cancelled treatment:       Reason Eval/Treat Not Completed:  (chart reviewed; consulted pt and Dtr in room). Per chart notes, labs, and pt/family report, pt has been tolerating the recommended Pureed consistency diet w/ no overt s/s of aspiration noted; no decline in Pulmonary status in recent few days now. When asked about the diet consistency, he stated "it's absolutely fine" and that he did not want foods sent to him any other way.  Pt continues to become easily SOB w/ any exertion including talking. Instructed him(and family) to take Rest Breaks to help resolve, and to not drink liquids or eat while SOB/WOB. Pt agreed. Discussed and gave handouts on general aspiration precautions for pending D/C to SNF tomorrow. As pt appears at his Baseline(was previously on a Pureed diet intermittently before and did not tolerate minced foods), recommend continue a pureed diet w/ thin liquids w/ general aspiration precautions; Pills in Puree Crushed as needed and support at meals as needed. ST services will sign off w/ NSG to reconsult if any new needs arise while admitted. Pt and Dtr agreed.    Jerilynn Som, MS, CCC-SLP Speech Language Pathologist Rehab Services [phone] [facility] 07/28/2020, 3:18 PM

## 2020-07-28 NOTE — Progress Notes (Signed)
Mobility Specialist - Progress Note   07/28/20 1200  Mobility  Activity Transferred:  Bed to chair  Range of Motion/Exercises Right leg;Left leg (straight leg raises, ankle pumps)  Level of Assistance Minimal assist, patient does 75% or more  Assistive Device Front wheel walker  Distance Ambulated (ft) 3 ft  Mobility Response Tolerated well  Mobility performed by Mobility specialist  $Mobility charge 1 Mobility    Pre-mobility: 78 HR, 91% SpO2 During mobility: 81 HR, 88% SpO2 Post-mobility: 84 HR, 93% SpO2   Pt was lying in bed utilizing room air with family present in room. Pt agreed to session. Pt denied pain. Pt was able to get EOB with modA and support for LLE. Pt performed straight leg raises and ankle pumps SBA. Nurse entered mid-session to administer medication. Pt progressed to standing minA from elevated bed height. HOB elevated for bed rail support. Pt ambulated 3' and sat in recliner, demonstrating good carryover with verbal cues. O2 desat to 88%. Pt utilized PLB technique and rest to increase sats to low-mid 90s. Overall, pt tolerated session well. Pt was left in recliner with all needs in reach and alarm set.    Filiberto Pinks Mobility Specialist 07/28/20, 1:04 PM

## 2020-07-28 NOTE — Progress Notes (Signed)
Patient ID: Martin Bean, male   DOB: 05-10-1930, 86 y.o.   MRN: [id number] [facility] PROGRESS NOTE  OZ GAMMEL WUX:[id number] DOB: 31-Jul-1930 DOA: 07/17/2020 PCP: Gracelyn Nurse, MD  HPI/Subjective: Patient urinating when I was in the room.  Patient also thinks he had a bowel movement.  His breathing is better than when he came in.  Still having some pain around the left knee  Objective: Vitals:   07/28/20 0852 07/28/20 1112  BP:  (!) 167/71  Pulse:  78  Resp:  16  Temp:  97.6 F (36.4 C)  SpO2: 93% 91%    Intake/Output Summary (Last 24 hours) at 07/28/2020 1536 Last data filed at 07/28/2020 1300 Gross per 24 hour  Intake 360 ml  Output 750 ml  Net -390 ml   Filed Weights   07/25/20 0339 07/26/20 0500 07/27/20 0500  Weight: 67.7 kg 62.2 kg 67.5 kg    ROS: Review of Systems  Respiratory: Negative for shortness of breath.   Cardiovascular: Negative for chest pain.  Gastrointestinal: Negative for abdominal pain, nausea and vomiting.  Musculoskeletal: Positive for joint pain.   Exam: Physical Exam HENT:     Head: Normocephalic.     Mouth/Throat:     Pharynx: No oropharyngeal exudate.  Eyes:     General: Lids are normal.     Conjunctiva/sclera: Conjunctivae normal.     Pupils: Pupils are equal, round, and reactive to light.  Cardiovascular:     Rate and Rhythm: Normal rate and regular rhythm.     Heart sounds: Normal heart sounds, S1 normal and S2 normal.  Pulmonary:     Breath sounds: Examination of the right-lower field reveals decreased breath sounds. Examination of the left-lower field reveals decreased breath sounds. Decreased breath sounds present. No wheezing, rhonchi or rales.  Abdominal:     Palpations: Abdomen is soft.     Tenderness: There is no abdominal tenderness.  Musculoskeletal:     Right lower leg: No swelling.     Left lower leg: No swelling.  Skin:    General: Skin is warm.     Findings: No rash.  Neurological:     Mental  Status: He is alert and oriented to person, place, and time.       Data Reviewed: Basic Metabolic Panel: Recent Labs  Lab 07/22/20 0731 07/23/20 0617 07/23/20 0716 07/23/20 1704 07/24/20 0049 07/24/20 0630 07/24/20 0942 07/25/20 0501 07/28/20 0528  NA 123* 123* 124*   < > 130* 132* 129* 132* 129*  K 4.1 3.9 4.0  --   --  4.1  --   --  4.2  CL 89* 88* 89*  --   --  96*  --   --  96*  CO2 24 25 25   --   --  27  --   --  26  GLUCOSE 198* 161* 156*  --   --  139*  --   --  110*  BUN 12 21 20   --   --  29*  --   --  32*  CREATININE 0.57* 0.60* 0.62  --   --  0.70  --   --  0.68  CALCIUM 8.6* 8.7* 8.7*  --   --  8.7*  --   --  8.5*   < > = values in this interval not displayed.   Liver Function Tests: Recent Labs  Lab 07/23/20 0716  AST 40  ALT 58*  ALKPHOS 72  BILITOT  0.7  PROT 6.2*  ALBUMIN 2.9*   CBC: Recent Labs  Lab 07/23/20 0612 07/26/20 0502  WBC 13.1* 14.5*  HGB 10.4* 11.1*  HCT 29.4* 31.4*  MCV 86.0 87.0  PLT 322 339   Cardiac Enzymes: Recent Labs  Lab 07/23/20 0716  CKTOTAL 32*   BNP (last 3 results) Recent Labs    07/17/20 1717  BNP 202.0*    Scheduled Meds: . amLODipine  5 mg Oral Daily  . aspirin EC  81 mg Oral Daily  . diclofenac Sodium  2 g Topical QID  . enoxaparin (LOVENOX) injection  40 mg Subcutaneous Q24H  . feeding supplement  237 mL Oral BID BM  . fluticasone furoate-vilanterol  1 puff Inhalation Daily   And  . umeclidinium bromide  1 puff Inhalation Daily  . furosemide  20 mg Oral Daily  . guaiFENesin  600 mg Oral Daily  . multivitamin with minerals  1 tablet Oral Daily  . pantoprazole  40 mg Oral Daily  . [START ON 07/29/2020] predniSONE  10 mg Oral Q breakfast  . sodium chloride flush  3 mL Intravenous Q12H  . sodium chloride  1 g Oral TID WC  . tamsulosin  0.4 mg Oral Daily   Continuous Infusions: . sodium chloride      Assessment/Plan:  1. COPD with exacerbations.  Lungs much better than presentation.  1 more  day of prednisone taper.  Continue nebulizer treatments and inhalers. 2. Multifocal pneumonia seen on CT scan.  Finished antibiotics during the hospital course 3. 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  Patient improved with tolvaptan.  Sodium still on the lower side at 129 but stable.  Continue low-dose Lasix and salt tablet and regular diet. 4. Essential hypertension on Norvasc.  Blood pressure variable during the hospital course 5. Hyperlipidemia unspecified on atorvastatin 6. BPH on Flomax 7. Weakness.  Physical therapy recommends rehab. 8. Acute hypoxic respiratory failure.  This has resolved and the patient is off oxygen 9. Leg pain.  Continue diclofenac gel 10. Severe malnutrition in context of chronic illness.    Code Status:     Code Status Orders  (From admission, onward)         Start     Ordered   07/18/20 1537  Do not attempt resuscitation (DNR)  Continuous       Question Answer Comment  In the event of cardiac or respiratory ARREST Do not call a "code blue"   In the event of cardiac or respiratory ARREST Do not perform Intubation, CPR, defibrillation or ACLS   In the event of cardiac or respiratory ARREST Use medication by any route, position, wound care, and other measures to relive pain and suffering. May use oxygen, suction and manual treatment of airway obstruction as needed for comfort.      07/18/20 1536        Code Status History    Date Active Date Inactive Code Status Order ID Comments User Context   07/17/2020 1958 07/18/2020 1536 Full Code [id number]  Briscoe Deutscher, MD ED   01/17/2019 1644 01/20/2019 2103 Full Code [id number]  Jama Flavors, MD ED   12/28/2018 1840 12/30/2018 1913 Full Code [id number]  Altamese Dilling, MD ED   11/01/2017 1225 11/06/2017 1702 Full Code [id number]  Ihor Austin, MD Inpatient   10/02/2017 1525 10/03/2017 1756 Full Code [id number]  Annice Needy, MD Inpatient   08/14/2017 0935 08/15/2017 2214 Full Code [id number]  Milagros Loll,  MD ED   Advance  Care Planning Activity    Advance Directive Documentation   Flowsheet Row Most Recent Value  Type of Advance Directive Living will  Pre-existing out of facility DNR order (yellow form or pink MOST form) --  "MOST" Form in Place? --     Family Communication: Family at bedside Disposition Plan: Status is: Inpatient  Dispo: The patient is from: Assisted living              Anticipated d/c is to: Rehab              Anticipated d/c date is: 07/29/2020              Patient currently medically stable for discharge.  Received notification this afternoon that we did obtain insurance authorization for rehab but the facility will take tomorrow.  Time spent: 25 minutes  [employer]

## 2020-07-29 ENCOUNTER — Ambulatory Visit: Payer: Medicare HMO | Admitting: Family

## 2020-07-29 DIAGNOSIS — E43 Unspecified severe protein-calorie malnutrition: Secondary | ICD-10-CM

## 2020-07-29 LAB — RESP PANEL BY RT-PCR (FLU A&B, COVID) ARPGX2
Influenza A by PCR: NEGATIVE
Influenza B by PCR: NEGATIVE
SARS Coronavirus 2 by RT PCR: NEGATIVE

## 2020-07-29 MED ORDER — ADULT MULTIVITAMIN W/MINERALS CH
1.0000 | ORAL_TABLET | Freq: Every day | ORAL | Status: DC
Start: 2020-07-30 — End: 2020-09-13

## 2020-07-29 MED ORDER — SODIUM CHLORIDE 1 G PO TABS: 1.0000 g | ORAL_TABLET | Freq: Three times a day (TID) | ORAL | 0 refills | Status: AC

## 2020-07-29 MED ORDER — FUROSEMIDE 20 MG PO TABS
20.0000 mg | ORAL_TABLET | ORAL | 0 refills | Status: DC
Start: 2020-07-29 — End: 2020-11-17

## 2020-07-29 MED ORDER — LOPERAMIDE HCL 2 MG PO CAPS: 2.0000 mg | ORAL_CAPSULE | Freq: Four times a day (QID) | ORAL | 0 refills | Status: AC | PRN

## 2020-07-29 MED ORDER — RISAQUAD PO CAPS
2.0000 | ORAL_CAPSULE | Freq: Every day | ORAL | 0 refills | Status: DC
Start: 2020-07-29 — End: 2020-09-13

## 2020-07-29 MED ORDER — MIRTAZAPINE 15 MG PO TABS: 7.5000 mg | ORAL_TABLET | Freq: Every day | ORAL | Status: DC

## 2020-07-29 MED ORDER — RISAQUAD PO CAPS
2.0000 | ORAL_CAPSULE | Freq: Every day | ORAL | Status: DC
  Administered 2020-07-29: 2 via ORAL
  Filled 2020-07-29: qty 2

## 2020-07-29 MED ORDER — ENSURE ENLIVE PO LIQD: 237.0000 mL | Freq: Two times a day (BID) | ORAL | 0 refills | Status: AC

## 2020-07-29 MED ORDER — OXYCODONE HCL 5 MG PO TABS: 2.5000 mg | ORAL_TABLET | Freq: Four times a day (QID) | ORAL | 0 refills | Status: DC | PRN

## 2020-07-29 MED ORDER — MIRTAZAPINE 7.5 MG PO TABS: 7.5000 mg | ORAL_TABLET | Freq: Every day | ORAL | 0 refills | Status: AC

## 2020-07-29 MED ORDER — PREDNISONE 5 MG PO TABS: ORAL_TABLET | ORAL | 0 refills | Status: AC

## 2020-07-29 MED ORDER — DICLOFENAC SODIUM 1 % EX GEL: CUTANEOUS | 0 refills | Status: AC

## 2020-07-29 MED ORDER — AMLODIPINE BESYLATE 5 MG PO TABS
5.0000 mg | ORAL_TABLET | Freq: Every day | ORAL | 0 refills | Status: DC
Start: 2020-07-30 — End: 2020-09-13

## 2020-07-29 MED ORDER — ACETAMINOPHEN 325 MG PO TABS: 650.0000 mg | ORAL_TABLET | ORAL | Status: AC | PRN

## 2020-07-29 NOTE — Plan of Care (Signed)
Pt Martin Bean, forgetful at times. Follows commands and able to voice his needs. Several incontinence episodes noted early this am. Upon checking BP, it was elevated with SBP 190. NP Ouma notified after BP recheck done at 0615 and giving pt time to settle. MD Wietin recommended adm BP Norvasc earlier.  Safety measures in place. Will continue to monitor.                                                                                                                                       Problem: Education: Goal: Ability to demonstrate management of disease process will improve Outcome: Progressing Goal: Ability to verbalize understanding of medication therapies will improve Outcome: Progressing Goal: Individualized Educational Video(s) Outcome: Progressing   Problem: Activity: Goal: Capacity to carry out activities will improve Outcome: Progressing   Problem: Education: Goal: Knowledge of General Education information will improve Description: Including pain rating scale, medication(s)/side effects and non-pharmacologic comfort measures Outcome: Progressing   Problem: Health Behavior/Discharge Planning: Goal: Ability to manage health-related needs will improve Outcome: Progressing   Problem: Clinical Measurements: Goal: Ability to maintain clinical measurements within normal limits will improve Outcome: Progressing Goal: Will remain free from infection Outcome: Progressing Goal: Diagnostic test results will improve Outcome: Progressing Goal: Respiratory complications will improve Outcome: Progressing Goal: Cardiovascular complication will be avoided Outcome: Progressing   Problem: Nutrition: Goal: Adequate nutrition will be maintained Outcome: Progressing   Problem: Activity: Goal: Risk for activity intolerance will decrease Outcome: Progressing   Problem: Coping: Goal: Level of anxiety will decrease Outcome: Progressing   Problem: Elimination: Goal: Will not experience  complications related to bowel motility Outcome: Progressing Goal: Will not experience complications related to urinary retention Outcome: Progressing   Problem: Pain Managment: Goal: General experience of comfort will improve Outcome: Progressing   Problem: Safety: Goal: Ability to remain free from injury will improve Outcome: Progressing   Problem: Skin Integrity: Goal: Risk for impaired skin integrity will decrease Outcome: Progressing

## 2020-07-29 NOTE — Care Management Important Message (Signed)
Important Message  Patient Details  Name: Martin Bean MRN: [id number] Date of Birth: 06/11/1930   Medicare Important Message Given:  Yes     [name] 07/29/2020, 1:38 PM

## 2020-07-29 NOTE — Discharge Summary (Signed)
[facility] - [facility] at [facility]   PATIENT NAME: Martin Bean    MR#:  [id number]  DATE OF BIRTH:  1930-05-08  DATE OF ADMISSION:  07/17/2020 ADMITTING PHYSICIAN: Briscoe Deutscher, MD  DATE OF DISCHARGE: 07/29/2020  PRIMARY CARE PHYSICIAN: Gracelyn Nurse, MD    ADMISSION DIAGNOSIS:  Shortness of breath [R06.02] Hyponatremia [E87.1] Primary hypertension [I10] Hypoxia [R09.02] Other congestive heart failure (HCC) [I50.9] Acute on chronic diastolic CHF (congestive heart failure) (HCC) [I50.33]  DISCHARGE DIAGNOSIS:  Principal Problem:   Acute on chronic diastolic CHF (congestive heart failure) (HCC) Active Problems:   Depression, prolonged   CAP (community acquired pneumonia)   Multifocal pneumonia   Hyponatremia   Hypertensive urgency   COPD with acute exacerbation (HCC)   Acute respiratory failure with hypoxia (HCC)   Weakness   Pain of left lower extremity   Protein-calorie malnutrition, severe   SECONDARY DIAGNOSIS:   Past Medical History:  Diagnosis Date  . Carotid arterial disease (HCC) 10/02/2017  . CVA (cerebral vascular accident) (HCC) 08/14/2017  . Depression   . Diastolic heart failure (HCC)   . Family history of adverse reaction to anesthesia   . HOH (hard of hearing)   . HTN (hypertension)   . Hydropneumothorax 11/01/2017  . Hyperlipidemia 10/09/2017  . Malnutrition of moderate degree 11/02/2017  . Pneumonia   . Prostate enlargement   . Psoriasis     HOSPITAL COURSE:   1.  COPD with exacerbation.  The patient did have quite a bit of rattling in the chest and has been on a prolonged steroid course.  I will give 5 mg of prednisone for 2 more days and then half tablet for 2 more days after that.  Continue Trelegy inhaler and albuterol inhaler. 2.  Multifocal pneumonia seen on CT scan.  Patient finished up antibiotic course during the hospital stay. 3.  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  The patient had a sodium as low as 119  on presentation.  Did not improve with IV fluid hydration.  Improved after dose of tolvaptan was given.  Continue Lasix every other day and salt tablets 3 times a day and regular diet.  Sodium 129 upon disposition.  I think his sodium will always be on the lower side.  Recommend checking a weekly BMP. 4.  Essential hypertension.  Blood pressure very high during presentation added low-dose Norvasc.  Blood pressure variable depending on his mood. 5.  Hyperlipidemia unspecified on atorvastatin. 6.  BPH on Flomax 7.  Weakness.  Physical therapy recommends rehab. 8.  Acute hypoxic respiratory failure during the hospital course with COPD exacerbation multifocal pneumonia.  This has resolved and patient off oxygen. 9.  Leg pain and back pain.  Continue diclofenac gel and as needed oxycodone 10.  Severe malnutrition in context of chronic illness. 11.  Patient is a DO NOT RESUSCITATE 12.  Patient is on dysphagia 1 diet with thin liquids.  Sent extra gravy on meats and potatoes.  Yogurt 3 times daily with meals.  Magic cup.  Cream soups and a mug.  Patient may have oatmeal.  No straws. 13.  Palliative care to follow at facility.  Can convert over to hospice if declines. 14.  Depression and anxiety.  Zoloft stopped secondary to hyponatremia we will give a trial of Remeron at night which may also help out with appetite. 15 chronic diastolic congestive heart failure.  Likely more COPD exacerbation and pneumonia rather than heart failure.  DISCHARGE CONDITIONS:  Fair  CONSULTS OBTAINED:  Treatment Team:  Mosetta Pigeon, MD Enedina Finner, MD  DRUG ALLERGIES:  No Known Allergies  DISCHARGE MEDICATIONS:   Allergies as of 07/29/2020   No Known Allergies     Medication List    STOP taking these medications   Alka-Seltzer Plus Cold 2-7.8-325 MG Tbef Generic drug: Chlorphen-Phenyleph-ASA   midodrine 10 MG tablet Commonly known as: PROAMATINE   sertraline 50 MG tablet Commonly known as: ZOLOFT      TAKE these medications   acetaminophen 325 MG tablet Commonly known as: TYLENOL Take 2 tablets (650 mg total) by mouth every 4 (four) hours as needed for headache or mild pain.   acidophilus Caps capsule Take 2 capsules by mouth daily.   albuterol 108 (90 Base) MCG/ACT inhaler Commonly known as: VENTOLIN HFA Inhale 2 puffs into the lungs 2 (two) times daily as needed for wheezing or shortness of breath.   amLODipine 5 MG tablet Commonly known as: NORVASC Take 1 tablet (5 mg total) by mouth daily. Start taking on: July 30, 2020   aspirin EC 81 MG tablet Take 81 mg by mouth daily.   atorvastatin 40 MG tablet Commonly known as: LIPITOR Take 1 tablet (40 mg total) by mouth daily at 6 PM.   diclofenac Sodium 1 % Gel Commonly known as: VOLTAREN 2 grams four times a day as needed for pain left leg and back   feeding supplement Liqd Take 237 mLs by mouth 2 (two) times daily between meals.   furosemide 20 MG tablet Commonly known as: LASIX Take 1 tablet (20 mg total) by mouth every other day.   guaiFENesin 600 MG 12 hr tablet Commonly known as: MUCINEX Take 600 mg by mouth daily.   guaiFENesin 600 MG 12 hr tablet Commonly known as: MUCINEX Take 600 mg by mouth every 12 (twelve) hours as needed for cough (for cough or congestion).   loperamide 2 MG capsule Commonly known as: IMODIUM Take 1 capsule (2 mg total) by mouth every 6 (six) hours as needed for diarrhea or loose stools.   mirtazapine 7.5 MG tablet Commonly known as: REMERON Take 1 tablet (7.5 mg total) by mouth at bedtime.   multivitamin with minerals Tabs tablet Take 1 tablet by mouth daily. Start taking on: July 30, 2020   omeprazole 40 MG capsule Commonly known as: PRILOSEC Take 40 mg by mouth daily.   oxyCODONE 5 MG immediate release tablet Commonly known as: Oxy IR/ROXICODONE Take 0.5 tablets (2.5 mg total) by mouth every 6 (six) hours as needed for severe pain.   predniSONE 5 MG  tablet Commonly known as: DELTASONE One tab po daily for two days then 1/2 tab po daily for two days then stop Start taking on: July 30, 2020   sodium chloride 1 g tablet Take 1 tablet (1 g total) by mouth 3 (three) times daily with meals.   tamsulosin 0.4 MG Caps capsule Commonly known as: FLOMAX Take 0.4 mg by mouth daily.   Trelegy Ellipta 100-62.5-25 MCG/INH Aepb Generic drug: Fluticasone-Umeclidin-Vilant Inhale 1 puff into the lungs daily.        DISCHARGE INSTRUCTIONS:   Follow-up with rehab team  If you experience worsening of your admission symptoms, develop shortness of breath, life threatening emergency, suicidal or homicidal thoughts you must seek medical attention immediately by calling 911 or calling your MD immediately  if symptoms less severe.  You Must read complete instructions/literature along with all the possible adverse reactions/side effects for all  the Medicines you take and that have been prescribed to you. Take any new Medicines after you have completely understood and accept all the possible adverse reactions/side effects.   Please note  You were cared for by a hospitalist during your hospital stay. If you have any questions about your discharge medications or the care you received while you were in the hospital after you are discharged, you can call the unit and asked to speak with the hospitalist on call if the hospitalist that took care of you is not available. Once you are discharged, your primary care physician will handle any further medical issues. Please note that NO REFILLS for any discharge medications will be authorized once you are discharged, as it is imperative that you return to your primary care physician (or establish a relationship with a primary care physician if you do not have one) for your aftercare needs so that they can reassess your need for medications and monitor your lab values.    Today   CHIEF COMPLAINT:   Chief  Complaint  Patient presents with  . Shortness of Breath    HISTORY OF PRESENT ILLNESS:  Arlyce DiceJasper Bean  is a 86 y.o. male presented with shortness of breath   VITAL SIGNS:  Blood pressure (!) 185/87, pulse 71, temperature (!) 97.5 F (36.4 C), temperature source Oral, resp. rate 20, height 5\' 9"  (1.753 m), weight 67.5 kg, SpO2 97 %.   PHYSICAL EXAMINATION:  GENERAL:  86 y.o.-year-old patient lying in the bed with no acute distress.  EYES: Pupils equal, round, reactive to light and accommodation. No scleral icterus.  HEENT: Head atraumatic, normocephalic. Oropharynx and nasopharynx clear.   LUNGS: Normal breath sounds bilaterally, no wheezing, rales,rhonchi or crepitation. No use of accessory muscles of respiration.  CARDIOVASCULAR: S1, S2 normal. No murmurs, rubs, or gallops.  ABDOMEN: Soft, non-tender, non-distended. EXTREMITIES: No pedal edema.  NEUROLOGIC: Cranial nerves II through XII are intact. Muscle strength 5/5 in all extremities. Sensation intact. Gait not checked.  PSYCHIATRIC: The patient is alert and answers questions appropriately.  SKIN: No obvious rash, lesion, or ulcer.   DATA REVIEW:   CBC Recent Labs  Lab 07/26/20 0502  WBC 14.5*  HGB 11.1*  HCT 31.4*  PLT 339    Chemistries  Recent Labs  Lab 07/23/20 0716 07/23/20 1704 07/28/20 0528  NA 124*   < > 129*  K 4.0   < > 4.2  CL 89*   < > 96*  CO2 25   < > 26  GLUCOSE 156*   < > 110*  BUN 20   < > 32*  CREATININE 0.62   < > 0.68  CALCIUM 8.7*   < > 8.5*  AST 40  --   --   ALT 58*  --   --   ALKPHOS 72  --   --   BILITOT 0.7  --   --    < > = values in this interval not displayed.     Microbiology Results  Results for orders placed or performed during the hospital encounter of 07/17/20  Resp Panel by RT-PCR (Flu A&B, Covid) Nasopharyngeal Swab     Status: None   Collection Time: 07/17/20  6:26 PM   Specimen: Nasopharyngeal Swab; Nasopharyngeal(NP) swabs in vial transport medium  Result  Value Ref Range Status   SARS Coronavirus 2 by RT PCR NEGATIVE NEGATIVE Final    Comment: (NOTE) SARS-CoV-2 target nucleic acids are NOT DETECTED.  The SARS-CoV-2 RNA  is generally detectable in upper respiratory specimens during the acute phase of infection. The lowest concentration of SARS-CoV-2 viral copies this assay can detect is 138 copies/mL. A negative result does not preclude SARS-Cov-2 infection and should not be used as the sole basis for treatment or other patient management decisions. A negative result may occur with  improper specimen collection/handling, submission of specimen other than nasopharyngeal swab, presence of viral mutation(s) within the areas targeted by this assay, and inadequate number of viral copies(<138 copies/mL). A negative result must be combined with clinical observations, patient history, and epidemiological information. The expected result is Negative.  Fact Sheet for Patients:  BloggerCourse.com  Fact Sheet for Healthcare Providers:  SeriousBroker.it  This test is no t yet approved or cleared by the [place] FDA and  has been authorized for detection and/or diagnosis of SARS-CoV-2 by FDA under an Emergency Use Authorization (EUA). This EUA will remain  in effect (meaning this test can be used) for the duration of the COVID-19 declaration under Section 564(b)(1) of the Act, 21 U.S.C.section 360bbb-3(b)(1), unless the authorization is terminated  or revoked sooner.       Influenza A by PCR NEGATIVE NEGATIVE Final   Influenza B by PCR NEGATIVE NEGATIVE Final    Comment: (NOTE) The Xpert Xpress SARS-CoV-2/FLU/RSV plus assay is intended as an aid in the diagnosis of influenza from Nasopharyngeal swab specimens and should not be used as a sole basis for treatment. Nasal washings and aspirates are unacceptable for Xpert Xpress SARS-CoV-2/FLU/RSV testing.  Fact Sheet for  Patients: BloggerCourse.com  Fact Sheet for Healthcare Providers: SeriousBroker.it  This test is not yet approved or cleared by the [place] FDA and has been authorized for detection and/or diagnosis of SARS-CoV-2 by FDA under an Emergency Use Authorization (EUA). This EUA will remain in effect (meaning this test can be used) for the duration of the COVID-19 declaration under Section 564(b)(1) of the Act, 21 U.S.C. section 360bbb-3(b)(1), unless the authorization is terminated or revoked.  Performed at [facility], [address] [place], [place] [address]   Blood culture (routine x 2)     Status: None   Collection Time: 07/17/20  7:52 PM   Specimen: BLOOD  Result Value Ref Range Status   Specimen Description BLOOD RIGHT ANTECUBITAL  Final   Special Requests   Final    BOTTLES DRAWN AEROBIC AND ANAEROBIC Blood Culture results may not be optimal due to an excessive volume of blood received in culture bottles   Culture   Final    NO GROWTH 5 DAYS Performed at [facility], [address], [place], [place] [address]    Report Status 07/22/2020 FINAL  Final  Blood culture (routine x 2)     Status: None   Collection Time: 07/17/20  7:52 PM   Specimen: BLOOD  Result Value Ref Range Status   Specimen Description BLOOD BLOOD LEFT FOREARM  Final   Special Requests   Final    BOTTLES DRAWN AEROBIC AND ANAEROBIC Blood Culture adequate volume   Culture   Final    NO GROWTH 5 DAYS Performed at [facility], [address], [place], [place] [address]    Report Status 07/22/2020 FINAL  Final      Management plans discussed with the patient, family and they are in agreement.  CODE STATUS:     Code Status Orders  (From admission, onward)         Start  Ordered   07/18/20 1537  Do not attempt resuscitation (DNR)  Continuous       Question Answer Comment  In the event of cardiac or  respiratory ARREST Do not call a "code blue"   In the event of cardiac or respiratory ARREST Do not perform Intubation, CPR, defibrillation or ACLS   In the event of cardiac or respiratory ARREST Use medication by any route, position, wound care, and other measures to relive pain and suffering. May use oxygen, suction and manual treatment of airway obstruction as needed for comfort.      07/18/20 1536        Code Status History    Date Active Date Inactive Code Status Order ID Comments User Context   07/17/2020 1958 07/18/2020 1536 Full Code [id number]  Briscoe Deutscher, MD ED   01/17/2019 1644 01/20/2019 2103 Full Code [id number]  Jama Flavors, MD ED   12/28/2018 1840 12/30/2018 1913 Full Code [id number]  Altamese Dilling, MD ED   11/01/2017 1225 11/06/2017 1702 Full Code [id number]  Ihor Austin, MD Inpatient   10/02/2017 1525 10/03/2017 1756 Full Code [id number]  Annice Needy, MD Inpatient   08/14/2017 0935 08/15/2017 2214 Full Code [id number]  Milagros Loll, MD ED   Advance Care Planning Activity    Advance Directive Documentation   Flowsheet Row Most Recent Value  Type of Advance Directive Living will  Pre-existing out of facility DNR order (yellow form or pink MOST form) --  "MOST" Form in Place? --      TOTAL TIME TAKING CARE OF THIS PATIENT: 35 minutes.    Alford Highland M.D on 07/29/2020 at 9:29 AM  Between 7am to 6pm - Pager - [phone]  After 6pm go to [email or web address] - password [email or web address]  [facility]  CC: Primary care physician; Gracelyn Nurse, MD

## 2020-07-29 NOTE — Plan of Care (Signed)
Report given to nurse earlier in the shift that will be receiving the patient.  Patient is in stable condition.

## 2020-07-29 NOTE — TOC Transition Note (Signed)
Transition of Care [facility]) - CM/SW Discharge Note   Patient Details  Name: Martin Bean MRN: [id number] Date of Birth: 1929/09/30  Transition of Care [employer] [facility]) CM/SW Contact:  Chapman Fitch, RN Phone Number: 07/29/2020, 2:07 PM   Clinical Narrative:    Patient to discharge to [facility] Today Daughter [name] notified EMS transport set for 3pm  Bedside RN to call report  DC info sent in HUB   Final next level of care: Skilled Nursing Facility Barriers to Discharge: No Barriers Identified   Patient Goals and CMS Choice   CMS Medicare.gov Compare Post Acute Care list provided to:: Patient Represenative (must comment) Martin Bean, [name] (Daughter) [phone]) Choice offered to / list presented to : Adult Children Martin Bean, [name] (Daughter) [phone])  Discharge Placement              Patient chooses bed at: [facility] Patient to be transferred to facility by: EMS Name of family member notified: [name] Patient and family notified of of transfer: 07/29/20  Discharge Plan and Services In-house Referral: Clinical Social Work   Post Acute Care Choice: Skilled Nursing Facility                               Social Determinants of Health (SDOH) Interventions     Readmission Risk Interventions Readmission Risk Prevention Plan 01/19/2019  Transportation Screening Complete  PCP or Specialist Appt within 5-7 Days Complete  Home Care Screening Complete  Medication Review (RN CM) Complete  Some recent data might be hidden

## 2020-08-02 NOTE — Progress Notes (Signed)
Patient ID: Martin Bean, male    DOB: 03/22/1930, 86 y.o.   MRN: [id number]  HPI  Martin Bean is a 86 y/o male with a history of hyperlipidemia, HTN, stroke, COPD, HOH, depression, carotid disease, previous tobacco use and chronic heart failure.   Echo report from 07/18/20 reviewed and showed an EF of 60-65% without structural changes.   Admitted 07/17/20 due to COPD exacerbation along with severe hyponatremia with sodium level of 119. Prednisone, antibiotics and inhalers given for COPD. Given IVF for hyponatremia. Improved after tolvaptan and sodium tablets. Nephrology and palliative care consults obtained. Able to be weaned off of oxygen. Discharged after 12 days.   He presents today for his initial visit with a chief complaint of moderate shortness of breath upon minimal exertion. He describes this as having been present for several years. Says that it feels a little worse this morning because he's been "rushed around" at the facility to get ready and get here. He has associated cough, leg swelling, light-headedness and hearing loss along with this. He denies any difficulty sleeping, abdominal distention, palpitations, chest pain, fatigue or cough.   Currently on a pureed diet and says that his appetite is good. Says that he feels like lab work has been drawn at [facility] says he's been there. Finishing out prednisone due to recent COPD exacerbation/  Pneumonia.   Past Medical History:  Diagnosis Date  . Carotid arterial disease (HCC) 10/02/2017  . CHF (congestive heart failure) (HCC)   . COPD (chronic obstructive pulmonary disease) (HCC)   . CVA (cerebral vascular accident) (HCC) 08/14/2017  . Depression   . Diastolic heart failure (HCC)   . Family history of adverse reaction to anesthesia   . HOH (hard of hearing)   . HTN (hypertension)   . Hydropneumothorax 11/01/2017  . Hyperlipidemia 10/09/2017  . Malnutrition of moderate degree 11/02/2017  . Pneumonia   . Prostate  enlargement   . Psoriasis    Past Surgical History:  Procedure Laterality Date  . CATARACT EXTRACTION W/PHACO Right 02/25/2018   Procedure: CATARACT EXTRACTION PHACO AND INTRAOCULAR LENS PLACEMENT (IOC);  Surgeon: Galen Manila, MD;  Location: [facility] ORS;  Service: Ophthalmology;  Laterality: Right;  [place] 01:03.2 AP% 17.1 CDE 10.83 Fluid Pack lot #[id number]  . CATARACT EXTRACTION W/PHACO Left 03/18/2018   Procedure: CATARACT EXTRACTION PHACO AND INTRAOCULAR LENS PLACEMENT (IOC);  Surgeon: Galen Manila, MD;  Location: [facility];  Service: Ophthalmology;  Laterality: Left;  [place] 1.11 AP% 16.7 CDE 11.90 Fluid pack lot # [id number]  . ENDARTERECTOMY Left 10/02/2017   Procedure: ENDARTERECTOMY CAROTID;  Surgeon: Annice Needy, MD;  Location: [facility] OR[facility];  Service: Vascular;  Laterality: Left;  . HERNIA REPAIR     Family History  Problem Relation Age of Onset  . Stroke Mother    Social History   Tobacco Use  . Smoking status: Former Smoker    Types: Cigarettes    Quit date: 09/24/2013    Years since quitting: 6.8  . Smokeless tobacco: Never Used  Substance Use Topics  . Alcohol use: No   No Known Allergies Prior to Admission medications   Medication Sig Start Date End Date Taking? Authorizing Provider  acetaminophen (TYLENOL) 325 MG tablet Take 2 tablets (650 mg total) by mouth every 4 (four) hours as needed for headache or mild pain. 07/29/20  Yes Wieting, Richard, MD  acidophilus (RISAQUAD) CAPS capsule Take 2 capsules by mouth daily. 07/29/20  Yes Alford Highland, MD  albuterol (VENTOLIN  HFA) 108 (90 Base) MCG/ACT inhaler Inhale 2 puffs into the lungs 2 (two) times daily as needed for wheezing or shortness of breath.   Yes [provider]  amLODipine (NORVASC) 5 MG tablet Take 1 tablet (5 mg total) by mouth daily. 07/30/20  Yes Alford Highland, MD  aspirin EC 81 MG tablet Take 81 mg by mouth daily.  08/16/17  Yes [provider]  atorvastatin (LIPITOR) 40 MG tablet  Take 1 tablet (40 mg total) by mouth daily at 6 PM. 08/15/17  Yes Sudini, Wardell Heath, MD  diclofenac Sodium (VOLTAREN) 1 % GEL 2 grams four times a day as needed for pain left leg and back 07/29/20  Yes Wieting, Richard, MD  furosemide (LASIX) 20 MG tablet Take 1 tablet (20 mg total) by mouth every other day. 07/29/20  Yes Wieting, Richard, MD  guaiFENesin (MUCINEX) 600 MG 12 hr tablet Take 600 mg by mouth daily.   Yes [provider]  guaiFENesin (MUCINEX) 600 MG 12 hr tablet Take 600 mg by mouth every 12 (twelve) hours as needed for cough (for cough or congestion).   Yes [provider]  loperamide (IMODIUM) 2 MG capsule Take 1 capsule (2 mg total) by mouth every 6 (six) hours as needed for diarrhea or loose stools. 07/29/20  Yes Wieting, Richard, MD  mirtazapine (REMERON) 7.5 MG tablet Take 1 tablet (7.5 mg total) by mouth at bedtime. 07/29/20  Yes Wieting, Richard, MD  Multiple Vitamin (MULTIVITAMIN WITH MINERALS) TABS tablet Take 1 tablet by mouth daily. 07/30/20  Yes Wieting, Richard, MD  omeprazole (PRILOSEC) 40 MG capsule Take 40 mg by mouth daily. 04/11/20  Yes [provider]  oxyCODONE (OXY IR/ROXICODONE) 5 MG immediate release tablet Take 0.5 tablets (2.5 mg total) by mouth every 6 (six) hours as needed for severe pain. 07/29/20  Yes Wieting, Richard, MD  predniSONE (DELTASONE) 5 MG tablet Take 2.5 mg by mouth daily with breakfast.   Yes [provider]  sodium chloride 1 g tablet Take 1 tablet (1 g total) by mouth 3 (three) times daily with meals. 07/29/20  Yes Wieting, Richard, MD  tamsulosin (FLOMAX) 0.4 MG CAPS capsule Take 0.4 mg by mouth daily.  06/03/17  Yes [provider]  TRELEGY ELLIPTA 100-62.5-25 MCG/INH AEPB Inhale 1 puff into the lungs daily. 06/29/20  Yes [provider]  feeding supplement (ENSURE ENLIVE / ENSURE PLUS) LIQD Take 237 mLs by mouth 2 (two) times daily between meals. 07/29/20   Alford Highland, MD    Review  of Systems  Constitutional: Negative for appetite change and fatigue.  HENT: Positive for hearing loss. Negative for congestion and sore throat.   Eyes: Negative.   Respiratory: Positive for cough and shortness of breath (easily). Negative for chest tightness and wheezing.   Cardiovascular: Positive for leg swelling. Negative for chest pain and palpitations.  Gastrointestinal: Negative for abdominal distention and abdominal pain.  Endocrine: Negative.   Genitourinary: Negative.   Musculoskeletal: Negative for back pain and neck pain.  Skin: Negative.   Allergic/Immunologic: Negative.   Neurological: Positive for light-headedness (with sudden position changes). Negative for dizziness.  Hematological: Negative for adenopathy. Bruises/bleeds easily.  Psychiatric/Behavioral: Negative for dysphoric mood and sleep disturbance. The patient is not nervous/anxious.     Vitals:   08/03/20 0921  BP: 134/66  Pulse: 81  Resp: 16  SpO2: 95%  Weight: 149 lb (67.6 kg)  Height: 5\' 10"  (1.778 m)   Wt Readings from Last 3 Encounters:  08/03/20 149 lb (67.6 kg)  07/27/20 148 lb 13 oz (67.5 kg)  03/17/19 145 lb (65.8 kg)   Lab Results  Component Value Date   CREATININE 0.68 07/28/2020   CREATININE 0.70 07/24/2020   CREATININE 0.62 07/23/2020     Physical Exam Vitals and nursing note reviewed. Exam conducted with a chaperone present (daughter present ).  Constitutional:      Appearance: Normal appearance.  HENT:     Head: Normocephalic and atraumatic.     Right Ear: Decreased hearing noted.     Left Ear: Decreased hearing noted.  Cardiovascular:     Rate and Rhythm: Normal rate and regular rhythm.  Pulmonary:     Effort: Pulmonary effort is normal. No respiratory distress.     Breath sounds: Rhonchi (throughout all lung fields) present. No wheezing or rales.  Abdominal:     General: There is no distension.     Palpations: Abdomen is soft.     Tenderness: There is no abdominal  tenderness.  Musculoskeletal:        General: No tenderness.     Cervical back: Normal range of motion and neck supple.     Right lower leg: Edema (trace pitting) present.     Left lower leg: Edema (trace pitting) present.  Skin:    General: Skin is warm and dry.     Findings: Bruising (on arms) present.  Neurological:     General: No focal deficit present.     Mental Status: He is alert and oriented to person, place, and time.  Psychiatric:        Mood and Affect: Mood normal.        Behavior: Behavior normal.        Thought Content: Thought content normal.    Assessment & Plan:  1: Chronic heart failure with preserved ejection fraction without structural changes- - NYHA class III - euvolemic today - per facility orders, patient being weighed daily with parameters to call for an overnight weight gain of > 2 pounds or a weekly weight gain of >5 pounds - is taking sodium tablets TID due to severe hyponatremia; could also add a little salt to his food if his sodium level remains on the lower end; patient says most recent sodium level is in the low 130's - saw cardiology ([facility]) 07/01/2019 - BNP 07/17/20 was 202.0  2: HTN- - BP looks good today - saw PCP @ [facility] 08/02/20 Dareen Piano); previously had seen Dr. Letitia Libra - BMP 07/28/20 reviewed and showed sodium 129, potassium 4.2, creatinine 0.68 and GFR >60  3: COPD- - has loose, productive cough; encouraged him to cough sputum out  - rhonchi clear somewhat after he coughs - finishing prednisone taper - quit smoking ~ 5-6 years ago   Facility medication list was reviewed.   Will not make a return appointment for patient at this time due to HF stability. Advised patient and his daughter that they could call back at anytime to schedule another appointment and they were comfortable with this plan.

## 2020-08-03 ENCOUNTER — Ambulatory Visit: Payer: Medicare HMO | Attending: Family | Admitting: Family

## 2020-08-03 ENCOUNTER — Other Ambulatory Visit: Payer: Self-pay

## 2020-08-03 ENCOUNTER — Encounter: Payer: Self-pay | Admitting: Family

## 2020-08-03 VITALS — BP 134/66 | HR 81 | Resp 16 | Ht 70.0 in | Wt 149.0 lb

## 2020-08-03 DIAGNOSIS — Z7982 Long term (current) use of aspirin: Secondary | ICD-10-CM | POA: Insufficient documentation

## 2020-08-03 DIAGNOSIS — Z7952 Long term (current) use of systemic steroids: Secondary | ICD-10-CM | POA: Diagnosis not present

## 2020-08-03 DIAGNOSIS — J449 Chronic obstructive pulmonary disease, unspecified: Secondary | ICD-10-CM | POA: Insufficient documentation

## 2020-08-03 DIAGNOSIS — F32A Depression, unspecified: Secondary | ICD-10-CM | POA: Insufficient documentation

## 2020-08-03 DIAGNOSIS — Z79899 Other long term (current) drug therapy: Secondary | ICD-10-CM | POA: Insufficient documentation

## 2020-08-03 DIAGNOSIS — Z87891 Personal history of nicotine dependence: Secondary | ICD-10-CM | POA: Diagnosis not present

## 2020-08-03 DIAGNOSIS — H9190 Unspecified hearing loss, unspecified ear: Secondary | ICD-10-CM | POA: Diagnosis not present

## 2020-08-03 DIAGNOSIS — R0602 Shortness of breath: Secondary | ICD-10-CM | POA: Diagnosis not present

## 2020-08-03 DIAGNOSIS — E871 Hypo-osmolality and hyponatremia: Secondary | ICD-10-CM | POA: Insufficient documentation

## 2020-08-03 DIAGNOSIS — R42 Dizziness and giddiness: Secondary | ICD-10-CM | POA: Insufficient documentation

## 2020-08-03 DIAGNOSIS — Z791 Long term (current) use of non-steroidal anti-inflammatories (NSAID): Secondary | ICD-10-CM | POA: Diagnosis not present

## 2020-08-03 DIAGNOSIS — E785 Hyperlipidemia, unspecified: Secondary | ICD-10-CM | POA: Insufficient documentation

## 2020-08-03 DIAGNOSIS — M7989 Other specified soft tissue disorders: Secondary | ICD-10-CM | POA: Diagnosis not present

## 2020-08-03 DIAGNOSIS — I10 Essential (primary) hypertension: Secondary | ICD-10-CM

## 2020-08-03 DIAGNOSIS — I5032 Chronic diastolic (congestive) heart failure: Secondary | ICD-10-CM | POA: Diagnosis not present

## 2020-08-03 DIAGNOSIS — I110 Hypertensive heart disease with heart failure: Secondary | ICD-10-CM | POA: Insufficient documentation

## 2020-08-03 DIAGNOSIS — R058 Other specified cough: Secondary | ICD-10-CM | POA: Diagnosis not present

## 2020-08-03 DIAGNOSIS — Z8673 Personal history of transient ischemic attack (TIA), and cerebral infarction without residual deficits: Secondary | ICD-10-CM | POA: Diagnosis not present

## 2020-08-03 NOTE — Patient Instructions (Addendum)
Continue weighing daily and call for an overnight weight gain of > 2 pounds or a weekly weight gain of >5 pounds.   Return in the future if you'd like to schedule another appointment

## 2020-09-08 ENCOUNTER — Other Ambulatory Visit: Payer: Self-pay

## 2020-09-08 ENCOUNTER — Encounter: Payer: Self-pay | Admitting: Emergency Medicine

## 2020-09-08 ENCOUNTER — Emergency Department: Payer: Medicare HMO

## 2020-09-08 ENCOUNTER — Inpatient Hospital Stay
Admission: EM | Admit: 2020-09-08 | Discharge: 2020-09-13 | DRG: 177 | Disposition: A | Discharge status: Home Health | Payer: Medicare HMO | Attending: Hospitalist | Admitting: Hospitalist

## 2020-09-08 DIAGNOSIS — Z87891 Personal history of nicotine dependence: Secondary | ICD-10-CM | POA: Diagnosis not present

## 2020-09-08 DIAGNOSIS — I700 Atherosclerosis of aorta: Secondary | ICD-10-CM | POA: Diagnosis present

## 2020-09-08 DIAGNOSIS — H9190 Unspecified hearing loss, unspecified ear: Secondary | ICD-10-CM | POA: Diagnosis present

## 2020-09-08 DIAGNOSIS — I248 Other forms of acute ischemic heart disease: Secondary | ICD-10-CM | POA: Diagnosis present

## 2020-09-08 DIAGNOSIS — Z7951 Long term (current) use of inhaled steroids: Secondary | ICD-10-CM

## 2020-09-08 DIAGNOSIS — L409 Psoriasis, unspecified: Secondary | ICD-10-CM | POA: Diagnosis present

## 2020-09-08 DIAGNOSIS — E785 Hyperlipidemia, unspecified: Secondary | ICD-10-CM | POA: Diagnosis present

## 2020-09-08 DIAGNOSIS — J9601 Acute respiratory failure with hypoxia: Secondary | ICD-10-CM | POA: Diagnosis present

## 2020-09-08 DIAGNOSIS — I4891 Unspecified atrial fibrillation: Secondary | ICD-10-CM | POA: Diagnosis present

## 2020-09-08 DIAGNOSIS — N400 Enlarged prostate without lower urinary tract symptoms: Secondary | ICD-10-CM | POA: Diagnosis present

## 2020-09-08 DIAGNOSIS — K219 Gastro-esophageal reflux disease without esophagitis: Secondary | ICD-10-CM | POA: Diagnosis present

## 2020-09-08 DIAGNOSIS — U071 COVID-19: Secondary | ICD-10-CM | POA: Diagnosis present

## 2020-09-08 DIAGNOSIS — I110 Hypertensive heart disease with heart failure: Secondary | ICD-10-CM | POA: Diagnosis present

## 2020-09-08 DIAGNOSIS — E222 Syndrome of inappropriate secretion of antidiuretic hormone: Secondary | ICD-10-CM | POA: Diagnosis present

## 2020-09-08 DIAGNOSIS — Z8701 Personal history of pneumonia (recurrent): Secondary | ICD-10-CM

## 2020-09-08 DIAGNOSIS — J1282 Pneumonia due to coronavirus disease 2019: Secondary | ICD-10-CM | POA: Diagnosis present

## 2020-09-08 DIAGNOSIS — I2510 Atherosclerotic heart disease of native coronary artery without angina pectoris: Secondary | ICD-10-CM | POA: Diagnosis present

## 2020-09-08 DIAGNOSIS — Z823 Family history of stroke: Secondary | ICD-10-CM

## 2020-09-08 DIAGNOSIS — J439 Emphysema, unspecified: Secondary | ICD-10-CM | POA: Diagnosis present

## 2020-09-08 DIAGNOSIS — D649 Anemia, unspecified: Secondary | ICD-10-CM | POA: Diagnosis not present

## 2020-09-08 DIAGNOSIS — Z7982 Long term (current) use of aspirin: Secondary | ICD-10-CM | POA: Diagnosis not present

## 2020-09-08 DIAGNOSIS — Z8673 Personal history of transient ischemic attack (TIA), and cerebral infarction without residual deficits: Secondary | ICD-10-CM

## 2020-09-08 DIAGNOSIS — F32A Depression, unspecified: Secondary | ICD-10-CM | POA: Diagnosis present

## 2020-09-08 DIAGNOSIS — J441 Chronic obstructive pulmonary disease with (acute) exacerbation: Secondary | ICD-10-CM

## 2020-09-08 DIAGNOSIS — I5032 Chronic diastolic (congestive) heart failure: Secondary | ICD-10-CM | POA: Diagnosis present

## 2020-09-08 DIAGNOSIS — Z79899 Other long term (current) drug therapy: Secondary | ICD-10-CM

## 2020-09-08 DIAGNOSIS — I509 Heart failure, unspecified: Secondary | ICD-10-CM

## 2020-09-08 DIAGNOSIS — R778 Other specified abnormalities of plasma proteins: Secondary | ICD-10-CM

## 2020-09-08 LAB — COMPREHENSIVE METABOLIC PANEL
ALT: 11 U/L (ref 0–44)
AST: 20 U/L (ref 15–41)
Albumin: 2.9 g/dL — ABNORMAL LOW (ref 3.5–5.0)
Alkaline Phosphatase: 91 U/L (ref 38–126)
Anion gap: 12 (ref 5–15)
BUN: 12 mg/dL (ref 8–23)
CO2: 22 mmol/L (ref 22–32)
Calcium: 7.8 mg/dL — ABNORMAL LOW (ref 8.9–10.3)
Chloride: 97 mmol/L — ABNORMAL LOW (ref 98–111)
Creatinine, Ser: 0.82 mg/dL (ref 0.61–1.24)
GFR, Estimated: >60 mL/min (ref >60)
Glucose, Bld: 176 mg/dL — ABNORMAL HIGH (ref 70–99)
Potassium: 3.5 mmol/L (ref 3.5–5.1)
Sodium: 131 mmol/L — ABNORMAL LOW (ref 135–145)
Total Bilirubin: 0.8 mg/dL (ref 0.3–1.2)
Total Protein: 6.3 g/dL — ABNORMAL LOW (ref 6.5–8.1)

## 2020-09-08 LAB — MAGNESIUM: Magnesium: 1.8 mg/dL (ref 1.7–2.4)

## 2020-09-08 LAB — FIBRINOGEN: Fibrinogen: 380 mg/dL (ref 210–475)

## 2020-09-08 LAB — CBC WITH DIFFERENTIAL/PLATELET
Abs Immature Granulocytes: 0.06 10*3/uL (ref 0.00–0.07)
Basophils Absolute: 0 10*3/uL (ref 0.0–0.1)
Basophils Relative: 0 %
Eosinophils Absolute: 0 10*3/uL (ref 0.0–0.5)
Eosinophils Relative: 0 %
HCT: 31.6 % — ABNORMAL LOW (ref 39.0–52.0)
Hemoglobin: 10.8 g/dL — ABNORMAL LOW (ref 13.0–17.0)
Immature Granulocytes: 0 %
Lymphocytes Relative: 4 %
Lymphs Abs: 0.6 10*3/uL — ABNORMAL LOW (ref 0.7–4.0)
MCH: 30.2 pg (ref 26.0–34.0)
MCHC: 34.2 g/dL (ref 30.0–36.0)
MCV: 88.3 fL (ref 80.0–100.0)
Monocytes Absolute: 0.9 10*3/uL (ref 0.1–1.0)
Monocytes Relative: 6 %
Neutro Abs: 13.3 10*3/uL — ABNORMAL HIGH (ref 1.7–7.7)
Neutrophils Relative %: 90 %
Platelets: 176 10*3/uL (ref 150–400)
RBC: 3.58 MIL/uL — ABNORMAL LOW (ref 4.22–5.81)
RDW: 14.9 % (ref 11.5–15.5)
WBC: 15 10*3/uL — ABNORMAL HIGH (ref 4.0–10.5)
nRBC: 0 % (ref 0.0–0.2)

## 2020-09-08 LAB — FERRITIN: Ferritin: 102 ng/mL (ref 24–336)

## 2020-09-08 LAB — TROPONIN I (HIGH SENSITIVITY)
Troponin I (High Sensitivity): 108 ng/L (ref <18)
Troponin I (High Sensitivity): 108 ng/L (ref <18)

## 2020-09-08 LAB — FIBRIN DERIVATIVES D-DIMER (ARMC ONLY): Fibrin derivatives D-dimer (ARMC): 1477.62 ng/mL (FEU) — ABNORMAL HIGH (ref 0.00–499.00)

## 2020-09-08 LAB — LACTIC ACID, PLASMA
Lactic Acid, Venous: 1.6 mmol/L (ref 0.5–1.9)
Lactic Acid, Venous: 1.4 mmol/L (ref 0.5–1.9)

## 2020-09-08 LAB — TRIGLYCERIDES: Triglycerides: 65 mg/dL (ref <150)

## 2020-09-08 LAB — PROTIME-INR
INR: 1.2 (ref 0.8–1.2)
Prothrombin Time: 14.3 seconds (ref 11.4–15.2)

## 2020-09-08 LAB — SARS CORONAVIRUS 2 BY RT PCR (HOSPITAL ORDER, PERFORMED IN ~~LOC~~ HOSPITAL LAB): SARS Coronavirus 2: POSITIVE — AB

## 2020-09-08 LAB — LACTATE DEHYDROGENASE: LDH: 111 U/L (ref 98–192)

## 2020-09-08 LAB — BRAIN NATRIURETIC PEPTIDE: B Natriuretic Peptide: 327.5 pg/mL — ABNORMAL HIGH (ref 0.0–100.0)

## 2020-09-08 LAB — APTT: aPTT: 43 seconds — ABNORMAL HIGH (ref 24–36)

## 2020-09-08 LAB — CBG MONITORING, ED: Glucose-Capillary: 216 mg/dL — ABNORMAL HIGH (ref 70–99)

## 2020-09-08 LAB — PROCALCITONIN: Procalcitonin: <0.1 ng/mL

## 2020-09-08 LAB — C-REACTIVE PROTEIN: CRP: 3.9 mg/dL — ABNORMAL HIGH (ref <1.0)

## 2020-09-08 MED ORDER — SERTRALINE HCL 50 MG PO TABS
50.0000 mg | ORAL_TABLET | Freq: Every day | ORAL | Status: DC
  Administered 2020-09-09 – 2020-09-13 (×5): 50 mg via ORAL
  Filled 2020-09-08 (×5): qty 1

## 2020-09-08 MED ORDER — METHYLPREDNISOLONE SODIUM SUCC 125 MG IJ SOLR
125.0000 mg | Freq: Once | INTRAMUSCULAR | Status: AC
  Administered 2020-09-08: 125 mg via INTRAVENOUS
  Filled 2020-09-08: qty 2

## 2020-09-08 MED ORDER — OXYCODONE HCL 5 MG PO TABS: 2.5000 mg | ORAL_TABLET | Freq: Four times a day (QID) | ORAL | Status: DC | PRN

## 2020-09-08 MED ORDER — ONDANSETRON HCL 4 MG/2ML IJ SOLN: 4.0000 mg | Freq: Four times a day (QID) | INTRAMUSCULAR | Status: DC | PRN

## 2020-09-08 MED ORDER — SODIUM CHLORIDE 0.9 % IV SOLN
2.0000 g | Freq: Once | INTRAVENOUS | Status: AC
  Administered 2020-09-08: 2 g via INTRAVENOUS
  Filled 2020-09-08: qty 2

## 2020-09-08 MED ORDER — INSULIN ASPART 100 UNIT/ML ~~LOC~~ SOLN
0.0000 [IU] | SUBCUTANEOUS | Status: DC
  Administered 2020-09-08: 5 [IU] via SUBCUTANEOUS
  Administered 2020-09-09 (×4): 3 [IU] via SUBCUTANEOUS
  Administered 2020-09-09 – 2020-09-10 (×3): 2 [IU] via SUBCUTANEOUS
  Administered 2020-09-10 – 2020-09-11 (×4): 3 [IU] via SUBCUTANEOUS
  Administered 2020-09-11: 13:00:00 2 [IU] via SUBCUTANEOUS
  Administered 2020-09-11 (×2): 3 [IU] via SUBCUTANEOUS
  Administered 2020-09-11 – 2020-09-12 (×2): 2 [IU] via SUBCUTANEOUS
  Administered 2020-09-12: 17:00:00 8 [IU] via SUBCUTANEOUS
  Administered 2020-09-12 (×2): 3 [IU] via SUBCUTANEOUS
  Administered 2020-09-13 (×2): 2 [IU] via SUBCUTANEOUS
  Filled 2020-09-08 (×23): qty 1

## 2020-09-08 MED ORDER — VANCOMYCIN HCL 1500 MG/300ML IV SOLN
1500.0000 mg | Freq: Once | INTRAVENOUS | Status: AC
  Administered 2020-09-08: 1500 mg via INTRAVENOUS
  Filled 2020-09-08: qty 300

## 2020-09-08 MED ORDER — SODIUM CHLORIDE 1 G PO TABS
1.0000 g | ORAL_TABLET | Freq: Three times a day (TID) | ORAL | Status: DC
  Administered 2020-09-09 – 2020-09-13 (×13): 1 g via ORAL
  Filled 2020-09-08 (×14): qty 1

## 2020-09-08 MED ORDER — METHYLPREDNISOLONE SODIUM SUCC 40 MG IJ SOLR
40.0000 mg | Freq: Two times a day (BID) | INTRAMUSCULAR | Status: DC
  Administered 2020-09-09 – 2020-09-13 (×9): 40 mg via INTRAVENOUS
  Filled 2020-09-08 (×9): qty 1

## 2020-09-08 MED ORDER — SODIUM CHLORIDE 0.9% FLUSH
3.0000 mL | Freq: Two times a day (BID) | INTRAVENOUS | Status: DC
  Administered 2020-09-09 – 2020-09-13 (×10): 3 mL via INTRAVENOUS

## 2020-09-08 MED ORDER — ASPIRIN 81 MG PO CHEW
324.0000 mg | CHEWABLE_TABLET | Freq: Once | ORAL | Status: AC
  Administered 2020-09-08: 324 mg via ORAL
  Filled 2020-09-08: qty 4

## 2020-09-08 MED ORDER — ACETAMINOPHEN 325 MG PO TABS: 650.0000 mg | ORAL_TABLET | ORAL | Status: DC | PRN

## 2020-09-08 MED ORDER — VANCOMYCIN HCL IN DEXTROSE 1-5 GM/200ML-% IV SOLN: 1000.0000 mg | Freq: Once | INTRAVENOUS | Status: DC

## 2020-09-08 MED ORDER — ADULT MULTIVITAMIN W/MINERALS CH
1.0000 | ORAL_TABLET | Freq: Every day | ORAL | Status: DC
  Administered 2020-09-09 – 2020-09-13 (×5): 1 via ORAL
  Filled 2020-09-08 (×5): qty 1

## 2020-09-08 MED ORDER — ATORVASTATIN CALCIUM 20 MG PO TABS
40.0000 mg | ORAL_TABLET | Freq: Every day | ORAL | Status: DC
  Administered 2020-09-09 – 2020-09-12 (×4): 40 mg via ORAL
  Filled 2020-09-08 (×4): qty 2

## 2020-09-08 MED ORDER — GUAIFENESIN ER 600 MG PO TB12
600.0000 mg | ORAL_TABLET | Freq: Two times a day (BID) | ORAL | Status: DC | PRN
  Administered 2020-09-09: 600 mg via ORAL
  Filled 2020-09-08: qty 1

## 2020-09-08 MED ORDER — SODIUM CHLORIDE 0.9 % IV SOLN
100.0000 mg | Freq: Every day | INTRAVENOUS | Status: AC
  Administered 2020-09-09 – 2020-09-12 (×4): 100 mg via INTRAVENOUS
  Filled 2020-09-08: qty 20
  Filled 2020-09-08: qty 100
  Filled 2020-09-08 (×2): qty 20

## 2020-09-08 MED ORDER — FUROSEMIDE 10 MG/ML IJ SOLN
40.0000 mg | Freq: Once | INTRAMUSCULAR | Status: AC
  Administered 2020-09-08: 40 mg via INTRAVENOUS
  Filled 2020-09-08: qty 4

## 2020-09-08 MED ORDER — IOHEXOL 350 MG/ML SOLN
75.0000 mL | Freq: Once | INTRAVENOUS | Status: AC | PRN
  Administered 2020-09-08: 75 mL via INTRAVENOUS

## 2020-09-08 MED ORDER — ALBUTEROL SULFATE HFA 108 (90 BASE) MCG/ACT IN AERS
2.0000 | INHALATION_SPRAY | Freq: Two times a day (BID) | RESPIRATORY_TRACT | Status: DC | PRN
  Filled 2020-09-08: qty 6.7

## 2020-09-08 MED ORDER — PANTOPRAZOLE SODIUM 40 MG PO TBEC
40.0000 mg | DELAYED_RELEASE_TABLET | Freq: Every day | ORAL | Status: DC
  Administered 2020-09-09 – 2020-09-13 (×5): 40 mg via ORAL
  Filled 2020-09-08 (×5): qty 1

## 2020-09-08 MED ORDER — ENOXAPARIN SODIUM 40 MG/0.4ML ~~LOC~~ SOLN
40.0000 mg | SUBCUTANEOUS | Status: DC
  Administered 2020-09-09 – 2020-09-13 (×5): 40 mg via SUBCUTANEOUS
  Filled 2020-09-08 (×5): qty 0.4

## 2020-09-08 MED ORDER — ASPIRIN EC 81 MG PO TBEC
81.0000 mg | DELAYED_RELEASE_TABLET | Freq: Every day | ORAL | Status: DC
Start: 2020-09-09 — End: 2020-09-13
  Administered 2020-09-09 – 2020-09-13 (×5): 81 mg via ORAL
  Filled 2020-09-08 (×5): qty 1

## 2020-09-08 MED ORDER — SODIUM CHLORIDE 0.9 % IV SOLN
200.0000 mg | Freq: Once | INTRAVENOUS | Status: AC
  Administered 2020-09-09: 200 mg via INTRAVENOUS
  Filled 2020-09-08: qty 40

## 2020-09-08 MED ORDER — IPRATROPIUM-ALBUTEROL 0.5-2.5 (3) MG/3ML IN SOLN
9.0000 mL | Freq: Once | RESPIRATORY_TRACT | Status: AC
  Administered 2020-09-08: 9 mL via RESPIRATORY_TRACT
  Filled 2020-09-08: qty 3

## 2020-09-08 MED ORDER — FLUTICASONE-UMECLIDIN-VILANT 100-62.5-25 MCG/INH IN AEPB: 1.0000 | INHALATION_SPRAY | Freq: Every day | RESPIRATORY_TRACT | Status: DC

## 2020-09-08 NOTE — Sepsis Progress Note (Signed)
Notified bedside nurse of need to draw blood cultures.  

## 2020-09-08 NOTE — ED Triage Notes (Addendum)
Pt to ED via [facility]. Pt was diagnosed with Covid on Monday. EMS stated that pt BP 136/68, Temp. 100, CO 20 and initial O2 sat was 90%. Pt has IV in left forearm and pt has received of normal saline. Pt denies chest pain.

## 2020-09-08 NOTE — ED Provider Notes (Signed)
[facility] Emergency Department Provider Note  ____________________________________________   Event Date/Time   First MD Initiated Contact with Patient 09/08/20 1611     (approximate)  I have reviewed the triage vital signs and the nursing notes.   HISTORY  Chief Complaint Shortness of Breath and Covid Positive   HPI Martin Bean is a 87 y.o. 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  Patient was reportedly diagnosed with COVID 3 days ago.  Patient denies any other acute sick symptoms including fevers, chills, headache, earache, sore throat, [place] pain, nausea, vomiting, diarrhea, dysuria, chest pain, back pain, rash or extremity pain.  No recent falls or injuries.  He does not normally wear any oxygen.  He does not currently smoke but has smoked in the past.  Denies EtOH or illicit drug use.         Past Medical History:  Diagnosis Date  . Carotid arterial disease ([place]) 10/02/2017  . CHF (congestive heart failure) ([place])   . COPD (chronic obstructive pulmonary disease) ([place])   . CVA (cerebral vascular accident) ([place]) 08/14/2017  . Depression   . Diastolic heart failure ([place])   . Family history of adverse reaction to anesthesia   . HOH (hard of hearing)   . HTN (hypertension)   . Hydropneumothorax 11/01/2017  . Hyperlipidemia 10/09/2017  . Malnutrition of moderate degree 11/02/2017  . Pneumonia   . Prostate enlargement   . Psoriasis     Patient Active Problem List   Diagnosis Date Noted  . Protein-calorie malnutrition, severe 07/28/2020  . Leg pain   . COPD with acute exacerbation ([place])   . Acute respiratory failure with hypoxia ([place])   . Weakness   . Acute on  chronic diastolic CHF (congestive heart failure) ([place]) 07/17/2020  . Hyponatremia 07/17/2020  . Hypertensive urgency 07/17/2020  . Bilateral pneumonia 01/17/2019  . CAP (community acquired pneumonia) 12/28/2018  . Acute renal failure (ARF) ([place]) 12/28/2018  . Multifocal pneumonia 12/28/2018  . Hemothorax   . Malnutrition of moderate degree 11/02/2017  . Hydropneumothorax 11/01/2017  . Essential hypertension 10/09/2017  . Hyperlipidemia 10/09/2017  . Carotid arterial disease ([place]) 10/02/2017  . Carotid artery stenosis, unilateral, left 09/04/2017  . CVA (cerebral vascular accident) ([place]) 08/14/2017  . Benign prostatic hyperplasia with urinary frequency 06/03/2017  . Depression, prolonged 06/03/2017  . 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 Psoriasis, unspecified 06/03/2017    Past Surgical History:  Procedure Laterality Date  . CATARACT EXTRACTION W/PHACO Right 02/25/2018   Procedure: CATARACT EXTRACTION PHACO AND INTRAOCULAR LENS PLACEMENT (IOC);  Surgeon: Birder Robson, MD;  Location: [facility] ORS;  Service: Ophthalmology;  Laterality: Right;  [place] 01:03.2 AP% 17.1 CDE 10.83 Fluid Pack lot #[id number]  . CATARACT EXTRACTION W/PHACO Left 03/18/2018   Procedure: CATARACT EXTRACTION PHACO AND INTRAOCULAR LENS PLACEMENT (IOC);  Surgeon: Birder Robson, MD;  Location: [facility] ORS;  Service: Ophthalmology;  Laterality: Left;  [place] 1.11 AP% 16.7 CDE 11.90 Fluid pack lot # [id number]  . ENDARTERECTOMY Left 10/02/2017   Procedure: ENDARTERECTOMY CAROTID;  Surgeon: Algernon Huxley, MD;  Location: [facility] ORS;  Service: Vascular;  Laterality: Left;  . HERNIA REPAIR      Prior to Admission medications   Medication Sig Start Date End  Date Taking? Authorizing Provider  amLODipine (NORVASC) 2.5 MG tablet Take 2.5 mg by mouth daily. 08/16/20  Yes [provider]  aspirin EC 81 MG tablet Take 81 mg by mouth daily.  08/16/17  Yes [provider]  atorvastatin (LIPITOR) 40 MG tablet Take 1 tablet  (40 mg total) by mouth daily at 6 PM. 08/15/17  Yes Sudini, Srikar, MD  furosemide (LASIX) 20 MG tablet Take 1 tablet (20 mg total) by mouth every other day. 07/29/20  Yes Loletha Grayer, MD  guaiFENesin (MUCINEX) 600 MG 12 hr tablet Take 600 mg by mouth every 12 (twelve) hours as needed for cough (for cough or congestion).   Yes [provider]  mirtazapine (REMERON) 7.5 MG tablet Take 1 tablet (7.5 mg total) by mouth at bedtime. 07/29/20  Yes Wieting, Richard, MD  omeprazole (PRILOSEC) 40 MG capsule Take 40 mg by mouth daily. 04/11/20  Yes [provider]  sertraline (ZOLOFT) 50 MG tablet Take 50 mg by mouth daily.   Yes [provider]  sodium chloride 1 g tablet Take 1 tablet (1 g total) by mouth 3 (three) times daily with meals. 07/29/20  Yes Wieting, Richard, MD  tamsulosin (FLOMAX) 0.4 MG CAPS capsule Take 0.4 mg by mouth daily.  06/03/17  Yes [provider]  TRELEGY ELLIPTA 100-62.5-25 MCG/INH AEPB Inhale 1 puff into the lungs daily. 06/29/20  Yes [provider]  acetaminophen (TYLENOL) 325 MG tablet Take 2 tablets (650 mg total) by mouth every 4 (four) hours as needed for headache or mild pain. 07/29/20   Loletha Grayer, MD  acidophilus (RISAQUAD) CAPS capsule Take 2 capsules by mouth daily. Patient not taking: Reported on 09/08/2020 07/29/20   Loletha Grayer, MD  albuterol (VENTOLIN HFA) 108 (90 Base) MCG/ACT inhaler Inhale 2 puffs into the lungs 2 (two) times daily as needed for wheezing or shortness of breath.    [provider]  amLODipine (NORVASC) 5 MG tablet Take 1 tablet (5 mg total) by mouth daily. Patient taking differently: Take 2.5 mg by mouth daily. 07/30/20   Loletha Grayer, MD  diclofenac Sodium (VOLTAREN) 1 % GEL 2 grams four times a day as needed for pain left leg and back 07/29/20   Loletha Grayer, MD  feeding supplement (ENSURE ENLIVE / ENSURE PLUS) LIQD Take 237 mLs by mouth 2 (two) times daily between meals.  07/29/20   Loletha Grayer, MD  loperamide (IMODIUM) 2 MG capsule Take 1 capsule (2 mg total) by mouth every 6 (six) hours as needed for diarrhea or loose stools. 07/29/20   Loletha Grayer, MD  Multiple Vitamin (MULTIVITAMIN WITH MINERALS) TABS tablet Take 1 tablet by mouth daily. Patient not taking: Reported on 09/08/2020 07/30/20   Loletha Grayer, MD  oxyCODONE (OXY IR/ROXICODONE) 5 MG immediate release tablet Take 0.5 tablets (2.5 mg total) by mouth every 6 (six) hours as needed for severe pain. 07/29/20   Loletha Grayer, MD    Allergies Patient has no known allergies.  Family History  Problem Relation Age of Onset  . Stroke Mother     Social History Social History   Tobacco Use  . Smoking status: Former Smoker    Types: Cigarettes    Quit date: 09/24/2013    Years since quitting: 6.9  . Smokeless tobacco: Never Used  Vaping Use  . Vaping Use: Never used  Substance Use Topics  . Alcohol use: No  . Drug use: No    Review of Systems  Review of Systems  Constitutional: Positive for malaise/fatigue. Negative for chills and fever.  HENT: Negative for sore throat.   Eyes: Negative for pain.  Respiratory: Positive for cough, sputum production and shortness of breath. Negative for stridor.   Cardiovascular: Negative for chest pain.  Gastrointestinal: Negative for vomiting.  Genitourinary: Negative for dysuria.  Musculoskeletal: Negative for myalgias.  Skin: Negative for rash.  Neurological: Negative for seizures, loss of consciousness and headaches.  Psychiatric/Behavioral: Negative for suicidal ideas.  All other systems reviewed and are negative.     ____________________________________________   PHYSICAL EXAM:  VITAL SIGNS: ED Triage Vitals  Enc Vitals Group     BP 09/08/20 1558 (!) 151/78     Pulse Rate 09/08/20 1558 (!) 107     Resp 09/08/20 1558 20     Temp 09/08/20 1558 99.1 F (37.3 C)     Temp Source 09/08/20 1558 Oral     SpO2 09/08/20 1558 94 %      Weight 09/08/20 1602 150 lb (68 kg)     Height 09/08/20 1602 5' 10"  (1.778 m)     Head Circumference --      Peak Flow --      Pain Score 09/08/20 1601 0     Pain Loc --      Pain Edu? --      Excl. in [place]? --    Vitals:   09/08/20 2200 09/08/20 2230  BP: (!) 103/51 (!) 99/51  Pulse: 80 81  Resp: (!) 30 (!) 23  Temp:    SpO2: 96% 95%   Physical Exam Vitals and nursing note reviewed.  Constitutional:      Appearance: He is well-developed and well-nourished.  HENT:     Head: Normocephalic and atraumatic.     Right Ear: External ear normal.     Left Ear: External ear normal.     Nose: Nose normal.  Eyes:     Conjunctiva/sclera: Conjunctivae normal.  Cardiovascular:     Rate and Rhythm: Regular rhythm. Tachycardia present.     Heart sounds: No murmur heard.   Pulmonary:     Effort: Pulmonary effort is normal. Tachypnea present. No respiratory distress.     Breath sounds: Decreased breath sounds, wheezing and rhonchi present.  Abdominal:     Palpations: Abdomen is soft.     Tenderness: There is no abdominal tenderness.  Musculoskeletal:     Cervical back: Neck supple.     Right lower leg: Edema present.     Left lower leg: Edema present.  Skin:    General: Skin is warm and dry.     Capillary Refill: Capillary refill takes less than 2 seconds.  Neurological:     Mental Status: He is alert and oriented to person, place, and time.  Psychiatric:        Mood and Affect: Mood and affect and mood normal.      ____________________________________________   LABS (all labs ordered are listed, but only abnormal results are displayed)  Labs Reviewed  SARS CORONAVIRUS 2 BY RT PCR ([facility]) - Abnormal; Notable for the following components:      Result Value   SARS Coronavirus 2 POSITIVE (*)    All other components within normal limits  CBC WITH DIFFERENTIAL/PLATELET - Abnormal; Notable for the following components:   WBC  15.0 (*)    RBC 3.58 (*)    Hemoglobin 10.8 (*)    HCT 31.6 (*)    Neutro  Abs 13.3 (*)    Lymphs Abs 0.6 (*)    All other components within normal limits  BRAIN NATRIURETIC PEPTIDE - Abnormal; Notable for the following components:   B Natriuretic Peptide 327.5 (*)    All other components within normal limits  APTT - Abnormal; Notable for the following components:   aPTT 43 (*)    All other components within normal limits  FIBRIN DERIVATIVES D-DIMER ([facility] ONLY) - Abnormal; Notable for the following components:   Fibrin derivatives D-dimer [facility]) 1,477.62 (*)    All other components within normal limits  C-REACTIVE PROTEIN - Abnormal; Notable for the following components:   CRP 3.9 (*)    All other components within normal limits  COMPREHENSIVE METABOLIC PANEL - Abnormal; Notable for the following components:   Sodium 131 (*)    Chloride 97 (*)    Glucose, Bld 176 (*)    Calcium 7.8 (*)    Total Protein 6.3 (*)    Albumin 2.9 (*)    All other components within normal limits  CBG MONITORING, ED - Abnormal; Notable for the following components:   Glucose-Capillary 216 (*)    All other components within normal limits  TROPONIN I (HIGH SENSITIVITY) - Abnormal; Notable for the following components:   Troponin I (High Sensitivity) 108 (*)    All other components within normal limits  TROPONIN I (HIGH SENSITIVITY) - Abnormal; Notable for the following components:   Troponin I (High Sensitivity) 108 (*)    All other components within normal limits  URINE CULTURE  CULTURE, BLOOD (ROUTINE X 2)  CULTURE, BLOOD (ROUTINE X 2)  LACTIC ACID, PLASMA  LACTIC ACID, PLASMA  MAGNESIUM  PROTIME-INR  PROCALCITONIN  FERRITIN  TRIGLYCERIDES  FIBRINOGEN  LACTATE DEHYDROGENASE  URINALYSIS, COMPLETE (UACMP) WITH MICROSCOPIC  HEMOGLOBIN A1C   ____________________________________________  EKG  Significant artifact throughout with a rate of 109.  A.  fib. ____________________________________________  RADIOLOGY  ED MD interpretation: Small bilateral pleural effusions and bilateral opacities consistent with pneumonia.  Official radiology report(s): DG Chest 1 View  Result Date: 09/08/2020 CLINICAL DATA:  Short of breath, COVID EXAM: CHEST  1 VIEW COMPARISON:  07/18/2020, CT chest 07/20/2020 FINDINGS: Small pleural effusions. Patchy basilar airspace opacities. Stable cardiomediastinal silhouette with aortic atherosclerosis. IMPRESSION: Small pleural effusions with patchy basilar airspace opacities, suspect for pneumonia. Electronically Signed   By: Donavan Foil M.D.   On: 09/08/2020 16:52   CT Angio Chest PE W and/or Wo Contrast  Result Date: 09/08/2020 CLINICAL DATA:  History of COVID-19 positivity with shortness of breath and hypoxia EXAM: CT ANGIOGRAPHY CHEST WITH CONTRAST TECHNIQUE: Multidetector CT imaging of the chest was performed using the standard protocol during bolus administration of intravenous contrast. Multiplanar CT image reconstructions and MIPs were obtained to evaluate the vascular anatomy. CONTRAST:  22m OMNIPAQUE IOHEXOL 350 MG/ML SOLN COMPARISON:  Plain film from earlier in the same day, CT from 07/20/2020. FINDINGS: Cardiovascular: Thoracic aorta demonstrates atherosclerotic calcifications. No aneurysmal dilatation or dissection is seen. No cardiac enlargement is seen. Coronary calcifications are noted. The pulmonary artery is well visualized bilaterally within normal branching pattern. No filling defect to suggest pulmonary embolism is noted. Mediastinum/Nodes: Thoracic inlet is within normal limits. No sizable hilar or mediastinal adenopathy is noted. The esophagus is distended with air. Lungs/Pleura: Emphysematous changes are noted bilaterally. Patchy airspace opacity is seen predominately within the lung bases consistent with the given clinical history of COVID-19 positivity. No pneumothorax is seen. No sizable parenchymal  nodule is  noted. Bibasilar scarring is noted stable from a prior CT. Upper Abdomen: Visualized upper abdomen is within normal limits. Musculoskeletal: Degenerative changes of the thoracic spine are noted. Old rib fractures with healing are seen on the right. Review of the MIP images confirms the above findings. IMPRESSION: No evidence of pulmonary emboli. Patchy airspace opacities in the lungs consistent with the given clinical history of COVID-19 positivity. Stable bibasilar scarring unchanged from December of 2021. Aortic Atherosclerosis (ICD10-I70.0) and Emphysema (ICD10-J43.9). Electronically Signed   By: Inez Catalina M.D.   On: 09/08/2020 20:02    ____________________________________________   PROCEDURES  Procedure(s) performed (including Critical Care):  .Critical Care Performed by: Lucrezia Starch, MD Authorized by: Lucrezia Starch, MD   Critical care provider statement:    Critical care time (minutes):  45   Critical care time was exclusive of:  Separately billable procedures and treating other patients   Critical care was necessary to treat or prevent imminent or life-threatening deterioration of the following conditions:  Respiratory failure   Critical care was time spent personally by me on the following activities:  Discussions with consultants, evaluation of patient's response to treatment, examination of patient, ordering and performing treatments and interventions, ordering and review of laboratory studies, ordering and review of radiographic studies, pulse oximetry, re-evaluation of patient's condition, obtaining history from patient or surrogate and review of old charts     ____________________________________________   INITIAL IMPRESSION / [place] / ED COURSE      Patient presents with above to history exam for assessment of cute onset of shortness of breath.  On arrival he is hypertensive with BP of 151/78, afebrile, tachycardic 107 and slightly  tachypneic with SPO2 of 94% room air.  On exam he has bilateral wheezes rhonchi as well as bilateral lower extremity edema.  Patient is requiring approximately 4 L nasal cannula to maintain his sats.  Differential includes but is not limited to ACS, arrhythmia, PE, acute heart failure exacerbation, COPD exacerbation, pneumonia, symptomatic pleural effusion, pneumothorax, acute anemia, and significant severe metabolic derangements.  Chest x-ray has evidence of bilateral small pleural effusions and bilateral coarse opacities consistent with pneumonia versus edema.  CBC is elevated white blood cell count at 15,000 and anemia at baseline with hemoglobin of 10.8 compared to 11.48-monthago.  Low suspicion for acute symptomatic anemia.  Lactic acid is 1.6.  Initial troponin is 108 which I suspect represents some demand ischemia.  Patient was given ASA given his somewhat artifact in his EKG is difficult to interpret.  BNP is elevated from baseline at 327 compared to 202 1 month ago.  Also plan to obtain CTA given D-dimer is elevated.  CTA shows no evidence of PE.  Given findings of volume overload on exam as well as elevated BNP and chest x-ray concerning for volume overload in addition to known COVID patient treated with some Lasix.  Given multiple SIRS criteria met he was also treated with antibiotics in addition to the steroids, duo nebs and remdesivir.  I will plan to admit to medicine service for further evaluation management.        ____________________________________________   FINAL CLINICAL IMPRESSION(S) / ED DIAGNOSES  Final diagnoses:  COVID  Acute respiratory failure with hypoxia (HCC)  COPD exacerbation ([place]  Acute on chronic congestive heart failure, unspecified heart failure type ([place]  Troponin I above reference range    Medications  remdesivir 200 mg in sodium chloride 0.9% 250 mL IVPB (has no administration in  time range)    Followed by  remdesivir 100 mg in sodium chloride  0.9 % 100 mL IVPB (has no administration in time range)  insulin aspart (novoLOG) injection 0-15 Units (5 Units Subcutaneous Given 09/08/20 2149)  methylPREDNISolone sodium succinate (SOLU-MEDROL) 125 mg/2 mL injection 125 mg (125 mg Intravenous Given 09/08/20 1721)  ipratropium-albuterol (DUONEB) 0.5-2.5 (3) MG/3ML nebulizer solution 9 mL (9 mLs Nebulization Given 09/08/20 1751)  ceFEPIme (MAXIPIME) 2 g in sodium chloride 0.9 % 100 mL IVPB (0 g Intravenous Stopped 09/08/20 2114)  vancomycin (VANCOREADY) IVPB 1500 mg/300 mL (0 mg Intravenous Stopped 09/08/20 2027)  aspirin chewable tablet 324 mg (324 mg Oral Given 09/08/20 1739)  furosemide (LASIX) injection 40 mg (40 mg Intravenous Given 09/08/20 1744)  iohexol (OMNIPAQUE) 350 MG/ML injection 75 mL (75 mLs Intravenous Contrast Given 09/08/20 1957)     ED Discharge Orders    None       Note:  This document was prepared using Dragon voice recognition software and may include unintentional dictation errors.   Lucrezia Starch, MD 09/08/20 2245

## 2020-09-08 NOTE — Sepsis Progress Note (Signed)
Sepsis monitoring complete. Did not receive fluid resuscitation d/t diastolic heart failure. Received lasix.

## 2020-09-08 NOTE — ED Notes (Signed)
Vancomycin started before Cefepime as cefepime was not available in the ED, but the vancomycin was. Given after verifying with ED provider that he was okay with the vancomycin going in first

## 2020-09-08 NOTE — Consult Note (Signed)
CODE SEPSIS - PHARMACY COMMUNICATION  **Broad Spectrum Antibiotics should be administered within 1 hour of Sepsis diagnosis**  Time Code Sepsis Called/Page Received: 1637  Antibiotics Ordered: 1627  Time of 1st antibiotic administration: 1739  Additional action taken by pharmacy: N/A  If necessary, Name of Provider/Nurse Contacted: N/A    Derrek Gu ,PharmD Clinical Pharmacist  09/08/2020  4:36 PM

## 2020-09-08 NOTE — H&P (Addendum)
History and Physical    Martin Bean IOE:[id number] DOB: November 30, 1929 DOA: 09/08/2020  PCP: Gracelyn Nurse, MD  Patient coming from: NH  I have personally briefly reviewed patient's old medical records in [facility]  Chief Complaint: sob with hx of recent COVID diagnosis  1/17  HPI: Martin Bean is a 87 y.o. male with medical history significant of  CVA, hypertension, chronic diastolic CHF, and depression, COPD,HLD who from NH due to increase sob in setting of recent covid diagnosis 1/17. Patient noted no associated chest pain,n/v/d/diarrhea /dysuria or f/c/ or sore throat or muscle aches.   ED Course:  Temp 99.1, BP: 151/78,  Hr 107, rr 20, sat 94%on 2L( 855 on ra Wbc: 15, hgb 10.8, plt 176,  pmn's90  Lactic 1.6 Trop:108 BNP327.5 inr 1.2 D-dimer: 1,  477 Crp 3.9 Respiratory panel : + covid Cxr:  IMPRESSION: Small pleural effusions with patchy basilar airspace opacities, suspect for pneumonia. Tx: vanc/cefepime/solumedrol,asa,lasix40mg ,douneb  ctpa No evidence of pulmonary emboli.  Patchy airspace opacities in the lungs consistent with the given clinical history of COVID-19 positivity.  Stable bibasilar scarring unchanged from December of 2021.  Aortic Atherosclerosis (ICD10-I70.0) and Emphysema (ICD10-J43.9).  Review of Systems: As per HPI otherwise 10 point review of systems negative.   Past Medical History:  Diagnosis Date  . Carotid arterial disease (HCC) 10/02/2017  . CHF (congestive heart failure) (HCC)   . COPD (chronic obstructive pulmonary disease) (HCC)   . CVA (cerebral vascular accident) (HCC) 08/14/2017  . Depression   . Diastolic heart failure (HCC)   . Family history of adverse reaction to anesthesia   . HOH (hard of hearing)   . HTN (hypertension)   . Hydropneumothorax 11/01/2017  . Hyperlipidemia 10/09/2017  . Malnutrition of moderate degree 11/02/2017  . Pneumonia   . Prostate enlargement   . Psoriasis     Past Surgical  History:  Procedure Laterality Date  . CATARACT EXTRACTION W/PHACO Right 02/25/2018   Procedure: CATARACT EXTRACTION PHACO AND INTRAOCULAR LENS PLACEMENT (IOC);  Surgeon: Galen Manila, MD;  Location: [facility] ORS;  Service: Ophthalmology;  Laterality: Right;  [place] 01:03.2 AP% 17.1 CDE 10.83 Fluid Pack lot #[id number]  . CATARACT EXTRACTION W/PHACO Left 03/18/2018   Procedure: CATARACT EXTRACTION PHACO AND INTRAOCULAR LENS PLACEMENT (IOC);  Surgeon: Galen Manila, MD;  Location: [facility] ORS;  Service: Ophthalmology;  Laterality: Left;  [place] 1.11 AP% 16.7 CDE 11.90 Fluid pack lot # [id number]  . ENDARTERECTOMY Left 10/02/2017   Procedure: ENDARTERECTOMY CAROTID;  Surgeon: Annice Needy, MD;  Location: [facility] ORS;  Service: Vascular;  Laterality: Left;  . HERNIA REPAIR       reports that he quit smoking about 6 years ago. His smoking use included cigarettes. He has never used smokeless tobacco. He reports that he does not drink alcohol and does not use drugs.  No Known Allergies  Family History  Problem Relation Age of Onset  . Stroke Mother    Prior to Admission medications   Medication Sig Start Date End Date Taking? Authorizing Provider  acetaminophen (TYLENOL) 325 MG tablet Take 2 tablets (650 mg total) by mouth every 4 (four) hours as needed for headache or mild pain. 07/29/20   Alford Highland, MD  acidophilus (RISAQUAD) CAPS capsule Take 2 capsules by mouth daily. 07/29/20   Alford Highland, MD  albuterol (VENTOLIN HFA) 108 (90 Base) MCG/ACT inhaler Inhale 2 puffs into the lungs 2 (two) times daily as needed for wheezing or shortness of breath.  [provider]  amLODipine (NORVASC) 5 MG tablet Take 1 tablet (5 mg total) by mouth daily. 07/30/20   Alford Highland, MD  aspirin EC 81 MG tablet Take 81 mg by mouth daily.  08/16/17   [provider]  atorvastatin (LIPITOR) 40 MG tablet Take 1 tablet (40 mg total) by mouth daily at 6 PM. 08/15/17   Milagros Loll, MD   diclofenac Sodium (VOLTAREN) 1 % GEL 2 grams four times a day as needed for pain left leg and back 07/29/20   Alford Highland, MD  feeding supplement (ENSURE ENLIVE / ENSURE PLUS) LIQD Take 237 mLs by mouth 2 (two) times daily between meals. 07/29/20   Alford Highland, MD  furosemide (LASIX) 20 MG tablet Take 1 tablet (20 mg total) by mouth every other day. 07/29/20   Alford Highland, MD  guaiFENesin (MUCINEX) 600 MG 12 hr tablet Take 600 mg by mouth daily.    [provider]  guaiFENesin (MUCINEX) 600 MG 12 hr tablet Take 600 mg by mouth every 12 (twelve) hours as needed for cough (for cough or congestion).    [provider]  loperamide (IMODIUM) 2 MG capsule Take 1 capsule (2 mg total) by mouth every 6 (six) hours as needed for diarrhea or loose stools. 07/29/20   Alford Highland, MD  mirtazapine (REMERON) 7.5 MG tablet Take 1 tablet (7.5 mg total) by mouth at bedtime. 07/29/20   Alford Highland, MD  Multiple Vitamin (MULTIVITAMIN WITH MINERALS) TABS tablet Take 1 tablet by mouth daily. 07/30/20   Alford Highland, MD  omeprazole (PRILOSEC) 40 MG capsule Take 40 mg by mouth daily. 04/11/20   [provider]  oxyCODONE (OXY IR/ROXICODONE) 5 MG immediate release tablet Take 0.5 tablets (2.5 mg total) by mouth every 6 (six) hours as needed for severe pain. 07/29/20   Alford Highland, MD  predniSONE (DELTASONE) 5 MG tablet Take 2.5 mg by mouth daily with breakfast.    [provider]  sodium chloride 1 g tablet Take 1 tablet (1 g total) by mouth 3 (three) times daily with meals. 07/29/20   Alford Highland, MD  tamsulosin (FLOMAX) 0.4 MG CAPS capsule Take 0.4 mg by mouth daily.  06/03/17   [provider]  TRELEGY ELLIPTA 100-62.5-25 MCG/INH AEPB Inhale 1 puff into the lungs daily. 06/29/20   [provider]    Physical Exam: Vitals:   09/08/20 1913 09/08/20 1915 09/08/20 2031 09/08/20 2041  BP:   (!) 96/40 105/60  Pulse: (!) 111 (!) 113  97 98  Resp: (!) 33 (!) 35 (!) 28 (!) 32  Temp:      TempSrc:      SpO2: 92% 95% 96% 94%  Weight:      Height:         Vitals:   09/08/20 1913 09/08/20 1915 09/08/20 2031 09/08/20 2041  BP:   (!) 96/40 105/60  Pulse: (!) 111 (!) 113 97 98  Resp: (!) 33 (!) 35 (!) 28 (!) 32  Temp:      TempSrc:      SpO2: 92% 95% 96% 94%  Weight:      Height:      Constitutional: NAD, calm, comfortable Eyes: PERRL, lids and conjunctivae normal ENMT: Mucous membranes are dry. Posterior pharynx clear of any exudate or lesions.Normal dentition.  Neck: normal, supple, no masses, no thyromegaly Respiratory: clear to auscultation bilaterally, no wheezing, no crackles. Normal respiratory effort. No accessory muscle use.  Cardiovascular: Regular rate and rhythm,  no murmurs / rubs / gallops. No extremity edema. 2+ pedal pulses. No carotid bruits.  Abdomen: no tenderness, no masses palpated. No hepatosplenomegaly. Bowel sounds positive.  Musculoskeletal: no clubbing / cyanosis. No joint deformity upper and lower extremities. Good ROM, no contractures. Normal muscle tone.  Skin: no rashes, lesions, ulcers. No induration Neurologic: CN 2-12 grossly intact. Sensation intact, DTR normal. Strength 5/5 in all 4.  Psychiatric: Normal judgment and insight. Alert and oriented to self and place. Normal mood.    Labs on Admission: I have personally reviewed following labs and imaging studies  CBC: Recent Labs  Lab 09/08/20 1607  WBC 15.0*  NEUTROABS 13.3*  HGB 10.8*  HCT 31.6*  MCV 88.3  PLT 176   Basic Metabolic Panel: Recent Labs  Lab 09/08/20 1607 09/08/20 1844  NA  --  131*  K  --  3.5  CL  --  97*  CO2  --  22  GLUCOSE  --  176*  BUN  --  12  CREATININE  --  0.82  CALCIUM  --  7.8*  MG 1.8  --    GFR: Estimated Creatinine Clearance: 59.9 mL/min (by C-G formula based on SCr of 0.82 mg/dL). Liver Function Tests: Recent Labs  Lab 09/08/20 1844  AST 20  ALT 11  ALKPHOS 91  BILITOT 0.8   PROT 6.3*  ALBUMIN 2.9*   No results for input(s): LIPASE, AMYLASE in the last 168 hours. No results for input(s): AMMONIA in the last 168 hours. Coagulation Profile: Recent Labs  Lab 09/08/20 1607  INR 1.2   Cardiac Enzymes: No results for input(s): CKTOTAL, CKMB, CKMBINDEX, TROPONINI in the last 168 hours. BNP (last 3 results) No results for input(s): PROBNP in the last 8760 hours. HbA1C: No results for input(s): HGBA1C in the last 72 hours. CBG: No results for input(s): GLUCAP in the last 168 hours. Lipid Profile: Recent Labs    09/08/20 1607  TRIG 65   Thyroid Function Tests: No results for input(s): TSH, T4TOTAL, FREET4, T3FREE, THYROIDAB in the last 72 hours. Anemia Panel: Recent Labs    09/08/20 1607  FERRITIN 102   Urine analysis:    Component Value Date/Time   COLORURINE YELLOW (A) 01/18/2019 1023   APPEARANCEUR CLEAR (A) 01/18/2019 1023   LABSPEC 1.010 01/18/2019 1023   PHURINE 6.0 01/18/2019 1023   GLUCOSEU NEGATIVE 01/18/2019 1023   HGBUR NEGATIVE 01/18/2019 1023   BILIRUBINUR NEGATIVE 01/18/2019 1023   KETONESUR NEGATIVE 01/18/2019 1023   PROTEINUR NEGATIVE 01/18/2019 1023   NITRITE NEGATIVE 01/18/2019 1023   LEUKOCYTESUR NEGATIVE 01/18/2019 1023    Radiological Exams on Admission: DG Chest 1 View  Result Date: 09/08/2020 CLINICAL DATA:  Short of breath, COVID EXAM: CHEST  1 VIEW COMPARISON:  07/18/2020, CT chest 07/20/2020 FINDINGS: Small pleural effusions. Patchy basilar airspace opacities. Stable cardiomediastinal silhouette with aortic atherosclerosis. IMPRESSION: Small pleural effusions with patchy basilar airspace opacities, suspect for pneumonia. Electronically Signed   By: Jasmine Pang M.D.   On: 09/08/2020 16:52   CT Angio Chest PE W and/or Wo Contrast  Result Date: 09/08/2020 CLINICAL DATA:  History of COVID-19 positivity with shortness of breath and hypoxia EXAM: CT ANGIOGRAPHY CHEST WITH CONTRAST TECHNIQUE: Multidetector CT imaging  of the chest was performed using the standard protocol during bolus administration of intravenous contrast. Multiplanar CT image reconstructions and MIPs were obtained to evaluate the vascular anatomy. CONTRAST:  50mL OMNIPAQUE IOHEXOL 350 MG/ML SOLN COMPARISON:  Plain film from earlier in the  same day, CT from 07/20/2020. FINDINGS: Cardiovascular: Thoracic aorta demonstrates atherosclerotic calcifications. No aneurysmal dilatation or dissection is seen. No cardiac enlargement is seen. Coronary calcifications are noted. The pulmonary artery is well visualized bilaterally within normal branching pattern. No filling defect to suggest pulmonary embolism is noted. Mediastinum/Nodes: Thoracic inlet is within normal limits. No sizable hilar or mediastinal adenopathy is noted. The esophagus is distended with air. Lungs/Pleura: Emphysematous changes are noted bilaterally. Patchy airspace opacity is seen predominately within the lung bases consistent with the given clinical history of COVID-19 positivity. No pneumothorax is seen. No sizable parenchymal nodule is noted. Bibasilar scarring is noted stable from a prior CT. Upper Abdomen: Visualized upper abdomen is within normal limits. Musculoskeletal: Degenerative changes of the thoracic spine are noted. Old rib fractures with healing are seen on the right. Review of the MIP images confirms the above findings. IMPRESSION: No evidence of pulmonary emboli. Patchy airspace opacities in the lungs consistent with the given clinical history of COVID-19 positivity. Stable bibasilar scarring unchanged from December of 2021. Aortic Atherosclerosis (ICD10-I70.0) and Emphysema (ICD10-J43.9). Electronically Signed   By: Alcide CleverMark  Lukens M.D.   On: 09/08/2020 20:02    EKG: Independently reviewed. afib with rvr hr 109,pvc  Assessment/Plan COVID viral Pneumonia with associated hypoxic respiratory failure  -solumedrol iv bid -remdesivir per pharmacy protocol  -patient currently with low  O2 requirement currently wean as able -self prone as able  -encourage po fluid intake  -daily monitoring of COVID labs , crp note to be low 3.9 - CTPA negative for pe -dvt ppx per protocol   ?New Onset afib  -ekg poor quality -repeat ekg pending  -patient w/o history of this in the past -further treatment pending repeat ekg  AECOPD -due to viral infection  - solumedrol iv bid  -nebs standing prn   Abn CE  - due to demand -repeat ekg pending , 1st poor quality  -continue cycle ce , current doubt acs  -patient w/o any chest pain   Diastolic CHF -patient w/o acute exacerbation  -CTPA w/o pleural effusion or pulmonary edema  - patient was given lasix in ed x 1, no further iv diuretic at this time -continue to monitor I/o and daily weights  -resume home lasix in am as bp tolerates and if renal function remains stable  CVA -no currently on secondary ppx    hypertension -hold HTN meds currently due to soft BP -resume as needed    depression -resume home regimen    HLD  -continue statin   GERD -ppi  BPH Hold tamsulosin due to soft bp   DVT prophylaxis: lovenox  Code Status:  Family Communication not at beside   Disposition Plan:patient  expected to be admitted greater than 2 midnights Consults called:n/a Admission status: inpatient    Lurline DelSara-Maiz A Cobie Leidner MD [facility]  If 7PM-7AM, please contact night-coverage [email or web address] Password [email or web address]  09/08/2020, 9:09 PM

## 2020-09-08 NOTE — ED Notes (Signed)
Brief changed, pericare preformed.  

## 2020-09-08 NOTE — ED Notes (Signed)
Primary RN and ED provider notified of this RNs note placed at 1754

## 2020-09-08 NOTE — ED Notes (Signed)
Pt placed on 4 L [place]  

## 2020-09-08 NOTE — Consult Note (Signed)
PHARMACY -  BRIEF ANTIBIOTIC NOTE   Pharmacy has received consult(s) for vancomycin and cefepime from an ED provider.  The patient's profile has been reviewed for ht/wt/allergies/indication/available labs.    One time order(s) placed for vancomycin 1500 mg and cefepime 2g  Further antibiotics/pharmacy consults should be ordered by admitting physician if indicated.                       Thank you, Derrek Gu 09/08/2020  4:50 PM

## 2020-09-08 NOTE — ED Notes (Signed)
RN attempted to get pt out of pants and a new depends on. Pt stated it was hard to breath and oxygen dropped to 83% on room air, prior it was 92-93% on room air. Pt placed in high fowlers and breathing treatment started at 1751, please see MAR. RN to notify provider and primary RN.

## 2020-09-08 NOTE — Progress Notes (Signed)
Remdesivir - Pharmacy Brief Note   O:  ALT: 11 CXR:  SpO2: 95 % on 4L   A/P:  Remdesivir 200 mg IVPB once followed by 100 mg IVPB daily x 4 days.   Konrad Hoak D 09/08/2020 7:41 PM

## 2020-09-09 DIAGNOSIS — J9601 Acute respiratory failure with hypoxia: Secondary | ICD-10-CM

## 2020-09-09 DIAGNOSIS — I4891 Unspecified atrial fibrillation: Secondary | ICD-10-CM

## 2020-09-09 LAB — CBG MONITORING, ED
Glucose-Capillary: 136 mg/dL — ABNORMAL HIGH (ref 70–99)
Glucose-Capillary: 141 mg/dL — ABNORMAL HIGH (ref 70–99)
Glucose-Capillary: 171 mg/dL — ABNORMAL HIGH (ref 70–99)
Glucose-Capillary: 176 mg/dL — ABNORMAL HIGH (ref 70–99)

## 2020-09-09 LAB — CBC
HCT: 29.9 % — ABNORMAL LOW (ref 39.0–52.0)
Hemoglobin: 9.9 g/dL — ABNORMAL LOW (ref 13.0–17.0)
MCH: 29.6 pg (ref 26.0–34.0)
MCHC: 33.1 g/dL (ref 30.0–36.0)
MCV: 89.3 fL (ref 80.0–100.0)
Platelets: 165 10*3/uL (ref 150–400)
RBC: 3.35 MIL/uL — ABNORMAL LOW (ref 4.22–5.81)
RDW: 15 % (ref 11.5–15.5)
WBC: 14.3 10*3/uL — ABNORMAL HIGH (ref 4.0–10.5)
nRBC: 0 % (ref 0.0–0.2)

## 2020-09-09 LAB — CREATININE, SERUM
Creatinine, Ser: 0.84 mg/dL (ref 0.61–1.24)
GFR, Estimated: >60 mL/min (ref >60)

## 2020-09-09 LAB — GLUCOSE, CAPILLARY
Glucose-Capillary: 174 mg/dL — ABNORMAL HIGH (ref 70–99)
Glucose-Capillary: 159 mg/dL — ABNORMAL HIGH (ref 70–99)

## 2020-09-09 LAB — TROPONIN I (HIGH SENSITIVITY)
Troponin I (High Sensitivity): 59 ng/L — ABNORMAL HIGH (ref <18)
Troponin I (High Sensitivity): 79 ng/L — ABNORMAL HIGH (ref <18)

## 2020-09-09 LAB — URINALYSIS, COMPLETE (UACMP) WITH MICROSCOPIC
Bacteria, UA: NONE SEEN
Bilirubin Urine: NEGATIVE
Glucose, UA: NEGATIVE mg/dL
Hgb urine dipstick: NEGATIVE
Ketones, ur: NEGATIVE mg/dL
Leukocytes,Ua: NEGATIVE
Nitrite: NEGATIVE
Protein, ur: NEGATIVE mg/dL
Specific Gravity, Urine: 1.032 — ABNORMAL HIGH (ref 1.005–1.030)
Squamous Epithelial / HPF: NONE SEEN (ref 0–5)
pH: 5 (ref 5.0–8.0)

## 2020-09-09 LAB — HEMOGLOBIN A1C
Hgb A1c MFr Bld: 5.7 % — ABNORMAL HIGH (ref 4.8–5.6)
Mean Plasma Glucose: 116.89 mg/dL

## 2020-09-09 LAB — FIBRIN DERIVATIVES D-DIMER (ARMC ONLY): Fibrin derivatives D-dimer (ARMC): 1015.05 ng/mL (FEU) — ABNORMAL HIGH (ref 0.00–499.00)

## 2020-09-09 LAB — C-REACTIVE PROTEIN: CRP: 7.8 mg/dL — ABNORMAL HIGH (ref <1.0)

## 2020-09-09 LAB — FERRITIN: Ferritin: 94 ng/mL (ref 24–336)

## 2020-09-09 MED ORDER — MIRTAZAPINE 15 MG PO TABS
7.5000 mg | ORAL_TABLET | Freq: Every day | ORAL | Status: DC
  Administered 2020-09-09 – 2020-09-12 (×4): 7.5 mg via ORAL
  Filled 2020-09-09 (×4): qty 1

## 2020-09-09 MED ORDER — FUROSEMIDE 20 MG PO TABS
20.0000 mg | ORAL_TABLET | ORAL | Status: DC
Start: 2020-09-10 — End: 2020-09-13
  Administered 2020-09-10 – 2020-09-12 (×2): 20 mg via ORAL
  Filled 2020-09-09 (×4): qty 1

## 2020-09-09 MED ORDER — AMLODIPINE BESYLATE 5 MG PO TABS
2.5000 mg | ORAL_TABLET | Freq: Every day | ORAL | Status: DC
  Administered 2020-09-09 – 2020-09-13 (×5): 2.5 mg via ORAL
  Filled 2020-09-09 (×5): qty 1

## 2020-09-09 MED ORDER — TAMSULOSIN HCL 0.4 MG PO CAPS
0.4000 mg | ORAL_CAPSULE | Freq: Every day | ORAL | Status: DC
  Administered 2020-09-09 – 2020-09-13 (×5): 0.4 mg via ORAL
  Filled 2020-09-09 (×5): qty 1

## 2020-09-09 MED ORDER — UMECLIDINIUM BROMIDE 62.5 MCG/INH IN AEPB
1.0000 | INHALATION_SPRAY | Freq: Every morning | RESPIRATORY_TRACT | Status: DC
  Administered 2020-09-09 – 2020-09-13 (×4): 1 via RESPIRATORY_TRACT
  Filled 2020-09-09 (×2): qty 7

## 2020-09-09 MED ORDER — FLUTICASONE FUROATE-VILANTEROL 100-25 MCG/INH IN AEPB
1.0000 | INHALATION_SPRAY | Freq: Every morning | RESPIRATORY_TRACT | Status: DC
  Administered 2020-09-09 – 2020-09-13 (×4): 1 via RESPIRATORY_TRACT
  Filled 2020-09-09 (×2): qty 28

## 2020-09-09 NOTE — ED Notes (Signed)
Date and time results received: 09/09/20 0518  Test: Troponin Critical Value: 79  Name of Provider Notified: Webb Silversmith, NP  Orders Received? Or Actions Taken?: awaiting response

## 2020-09-09 NOTE — ED Notes (Signed)
Admitting provider at bedside.

## 2020-09-09 NOTE — Progress Notes (Signed)
Daughter [name] given nightly update

## 2020-09-09 NOTE — ED Notes (Signed)
Patient provided with additional chocolate pudding at this time.

## 2020-09-09 NOTE — Progress Notes (Signed)
Spoke to patient in great length regarding isolation. Explained fan in room. All questions answered. Bo Mcclintock, RN

## 2020-09-09 NOTE — ED Notes (Signed)
Patient bed linens changed and peri care performed. 3 warm blankets applied. Monitor in place and call bell in reach

## 2020-09-09 NOTE — Progress Notes (Signed)
PROGRESS NOTE    Martin Bean  [id number]:[id number] DOB: [birth year] DOA: 09/08/2020 PCP: Gracelyn Nurse, MD    Assessment & Plan:   Active Problems:   Pneumonia due to COVID-19 virus   Martin Bean is a 87 y.o. male with medical history significant of  CVA, hypertension, chronic diastolic CHF, and depression, COPD,HLD who from NH due to increase sob in setting of recent covid diagnosis 1/17.   Acute hypoxic respiratory failure --2/2 COVID PNA - CTPA negative for pe --procal neg Plan: --Continue supplemental O2 to keep sats >=90%, wean as tolerated --treat covid  COVID PNA --started on Remdesivir and steroid --CRP 3.9 on presentation, trending up to 7.8 this morning Plan: --cont Remdesivir --cont solumedrol --Ensure CRP down-trending --cont daily bronchodilators  Afib, POA --no prior hx.  Currently rate controlled  Hx of COPD --cont daily bronchodilators  Trop elevation 2/2 demand ischemia --trop peaked at 108.  No chest pain  Diastolic CHF -patient w/o acute exacerbation  -CTPA w/o pleural effusion or pulmonary edema  - patient was given lasix in ed x 1 --resume home Lasix 20 mg every other day  CVA --cont home ASA 81 and statin   hypertension --resume home amlodipine and Lasix   depression --cont home Zoloft and Remeron   HLD  -continue statin   GERD -ppi  BPH --cont home Flomax   DVT prophylaxis: Lovenox SQ Code Status: Full code  Family Communication:  Status is: inpatient Dispo:   The patient is from: home Anticipated d/c is to: home Anticipated d/c date is: 2-3 days Patient currently is not medically ready to d/c due to: covid on tx with new O2 requirement   Subjective and Interval History:  Pt reported coughing improved.  No N/V/D.  No pain.   Objective: Vitals:   09/09/20 1100 09/09/20 1130 09/09/20 1300 09/09/20 1500  BP: 128/64 (!) 141/70 (!) 150/66 (!) 145/79  Pulse: 74 74 84 84  Resp: (!) 28  (!) 26 20   Temp:    97.6 F (36.4 C)  TempSrc:      SpO2: 98% 98% 96% 96%  Weight:      Height:        Intake/Output Summary (Last 24 hours) at 09/09/2020 1833 Last data filed at 09/09/2020 0325 Gross per 24 hour  Intake 250 ml  Output --  Net 250 ml   Filed Weights   09/08/20 1602  Weight: 68 kg    Examination:   Constitutional: NAD, AAOx3 HEENT: conjunctivae and lids normal, EOMI, hard of hearing CV: No cyanosis.   RESP: mild rhonchi, on 2L Extremities: No effusions, edema in BLE SKIN: warm, dry Neuro: II - XII grossly intact.   Psych: Normal mood and affect.  Appropriate judgement and reason   Data Reviewed: I have personally reviewed following labs and imaging studies  CBC: Recent Labs  Lab 09/08/20 1607 09/09/20 0035  WBC 15.0* 14.3*  NEUTROABS 13.3*  --   HGB 10.8* 9.9*  HCT 31.6* 29.9*  MCV 88.3 89.3  PLT 176 165   Basic Metabolic Panel: Recent Labs  Lab 09/08/20 1607 09/08/20 1844 09/09/20 0035  NA  --  131*  --   K  --  3.5  --   CL  --  97*  --   CO2  --  22  --   GLUCOSE  --  176*  --   BUN  --  12  --   CREATININE  --  0.82 0.84  CALCIUM  --  7.8*  --   MG 1.8  --   --    GFR: Estimated Creatinine Clearance: 58.5 mL/min (by C-G formula based on SCr of 0.84 mg/dL). Liver Function Tests: Recent Labs  Lab 09/08/20 1844  AST 20  ALT 11  ALKPHOS 91  BILITOT 0.8  PROT 6.3*  ALBUMIN 2.9*   No results for input(s): LIPASE, AMYLASE in the last 168 hours. No results for input(s): AMMONIA in the last 168 hours. Coagulation Profile: Recent Labs  Lab 09/08/20 1607  INR 1.2   Cardiac Enzymes: No results for input(s): CKTOTAL, CKMB, CKMBINDEX, TROPONINI in the last 168 hours. BNP (last 3 results) No results for input(s): PROBNP in the last 8760 hours. HbA1C: Recent Labs    09/09/20 0035  HGBA1C 5.7*   CBG: Recent Labs  Lab 09/09/20 0041 09/09/20 0405 09/09/20 0751 09/09/20 1216 09/09/20 1551  GLUCAP 176* 171* 141* 136* 174*    Lipid Profile: Recent Labs    09/08/20 1607  TRIG 65   Thyroid Function Tests: No results for input(s): TSH, T4TOTAL, FREET4, T3FREE, THYROIDAB in the last 72 hours. Anemia Panel: Recent Labs    09/08/20 1607 09/09/20 0329  FERRITIN 102 94   Sepsis Labs: Recent Labs  Lab 09/08/20 1607 09/08/20 1706  PROCALCITON <0.10  --   LATICACIDVEN 1.6 1.4    Recent Results (from the past 240 hour(s))  Blood culture (routine x 2)     Status: None (Preliminary result)   Collection Time: 09/08/20  5:06 PM   Specimen: BLOOD  Result Value Ref Range Status   Specimen Description BLOOD BLOOD RIGHT WRIST  Final   Special Requests   Final    BOTTLES DRAWN AEROBIC AND ANAEROBIC Blood Culture adequate volume   Culture   Final    NO GROWTH < 24 HOURS Performed at [facility], [address] [place], [place] [address]    Report Status PENDING  Incomplete  SARS Coronavirus 2 by RT PCR (hospital order, performed in [facility] hospital lab) Nasopharyngeal Nasopharyngeal Swab     Status: Abnormal   Collection Time: 09/08/20  5:06 PM   Specimen: Nasopharyngeal Swab  Result Value Ref Range Status   SARS Coronavirus 2 POSITIVE (A) NEGATIVE Final    Comment: RESULT CALLED TO, READ BACK BY AND VERIFIED WITH: REED RENO AT 1912 ON 09/08/20 BY SS (NOTE) SARS-CoV-2 target nucleic acids are DETECTED  SARS-CoV-2 RNA is generally detectable in upper respiratory specimens  during the acute phase of infection.  Positive results are indicative  of the presence of the identified virus, but do not rule out bacterial infection or co-infection with other pathogens not detected by the test.  Clinical correlation with patient history and  other diagnostic information is necessary to determine patient infection status.  The expected result is negative.  Fact Sheet for Patients:   BoilerBrush.com.cy   Fact Sheet for Healthcare Providers:    https://pope.com/    This test is not yet approved or cleared by the [place] FDA and  has been authorized for detection and/or diagnosis of SARS-CoV-2 by FDA under an Emergency Use Authorization (EUA).  This EUA will remain in effect (meaning this te st can be used) for the duration of  the COVID-19 declaration under Section 564(b)(1) of the Act, 21 U.S.C. section 360-bbb-3(b)(1), unless the authorization is terminated or revoked sooner.  Performed at [facility], [address], [place], [place] [address]   Blood culture (routine  x 2)     Status: None (Preliminary result)   Collection Time: 09/08/20  5:19 PM   Specimen: BLOOD  Result Value Ref Range Status   Specimen Description BLOOD BLOOD LEFT HAND  Final   Special Requests   Final    BOTTLES DRAWN AEROBIC AND ANAEROBIC Blood Culture results may not be optimal due to an inadequate volume of blood received in culture bottles   Culture   Final    NO GROWTH < 24 HOURS Performed at [facility], [address] [place], [place] [address]    Report Status PENDING  Incomplete      Radiology Studies: DG Chest 1 View  Result Date: 09/08/2020 CLINICAL DATA:  Short of breath, COVID EXAM: CHEST  1 VIEW COMPARISON:  07/18/2020, CT chest 07/20/2020 FINDINGS: Small pleural effusions. Patchy basilar airspace opacities. Stable cardiomediastinal silhouette with aortic atherosclerosis. IMPRESSION: Small pleural effusions with patchy basilar airspace opacities, suspect for pneumonia. Electronically Signed   By: Jasmine PangKim  Fujinaga M.D.   On: 09/08/2020 16:52   CT Angio Chest PE W and/or Wo Contrast  Result Date: 09/08/2020 CLINICAL DATA:  History of COVID-19 positivity with shortness of breath and hypoxia EXAM: CT ANGIOGRAPHY CHEST WITH CONTRAST TECHNIQUE: Multidetector CT imaging of the chest was performed using the standard protocol during bolus administration of intravenous contrast. Multiplanar CT  image reconstructions and MIPs were obtained to evaluate the vascular anatomy. CONTRAST:  75mL OMNIPAQUE IOHEXOL 350 MG/ML SOLN COMPARISON:  Plain film from earlier in the same day, CT from 07/20/2020. FINDINGS: Cardiovascular: Thoracic aorta demonstrates atherosclerotic calcifications. No aneurysmal dilatation or dissection is seen. No cardiac enlargement is seen. Coronary calcifications are noted. The pulmonary artery is well visualized bilaterally within normal branching pattern. No filling defect to suggest pulmonary embolism is noted. Mediastinum/Nodes: Thoracic inlet is within normal limits. No sizable hilar or mediastinal adenopathy is noted. The esophagus is distended with air. Lungs/Pleura: Emphysematous changes are noted bilaterally. Patchy airspace opacity is seen predominately within the lung bases consistent with the given clinical history of COVID-19 positivity. No pneumothorax is seen. No sizable parenchymal nodule is noted. Bibasilar scarring is noted stable from a prior CT. Upper Abdomen: Visualized upper abdomen is within normal limits. Musculoskeletal: Degenerative changes of the thoracic spine are noted. Old rib fractures with healing are seen on the right. Review of the MIP images confirms the above findings. IMPRESSION: No evidence of pulmonary emboli. Patchy airspace opacities in the lungs consistent with the given clinical history of COVID-19 positivity. Stable bibasilar scarring unchanged from December of 2021. Aortic Atherosclerosis (ICD10-I70.0) and Emphysema (ICD10-J43.9). Electronically Signed   By: Alcide CleverMark  Lukens M.D.   On: 09/08/2020 20:02     Scheduled Meds: . amLODipine  2.5 mg Oral Daily  . aspirin EC  81 mg Oral Daily  . atorvastatin  40 mg Oral q1800  . enoxaparin (LOVENOX) injection  40 mg Subcutaneous Q24H  . fluticasone furoate-vilanterol  1 puff Inhalation q morning - 10a   And  . umeclidinium bromide  1 puff Inhalation q morning - 10a  . insulin aspart  0-15 Units  Subcutaneous Q4H  . methylPREDNISolone (SOLU-MEDROL) injection  40 mg Intravenous Q12H  . mirtazapine  7.5 mg Oral QHS  . multivitamin with minerals  1 tablet Oral Daily  . pantoprazole  40 mg Oral Daily  . sertraline  50 mg Oral Daily  . sodium chloride flush  3 mL Intravenous Q12H  . sodium chloride  1 g Oral TID WC  .  tamsulosin  0.4 mg Oral Daily   Continuous Infusions: . remdesivir 100 mg in NS 100 mL Stopped (09/09/20 1210)     LOS: 1 day     Darlin Priestly, MD [facility] If 7PM-7AM, please contact night-coverage 09/09/2020, 6:33 PM

## 2020-09-10 LAB — CBC
HCT: 30.5 % — ABNORMAL LOW (ref 39.0–52.0)
Hemoglobin: 10.2 g/dL — ABNORMAL LOW (ref 13.0–17.0)
MCH: 29.7 pg (ref 26.0–34.0)
MCHC: 33.4 g/dL (ref 30.0–36.0)
MCV: 88.7 fL (ref 80.0–100.0)
Platelets: 169 10*3/uL (ref 150–400)
RBC: 3.44 MIL/uL — ABNORMAL LOW (ref 4.22–5.81)
RDW: 15 % (ref 11.5–15.5)
WBC: 14.2 10*3/uL — ABNORMAL HIGH (ref 4.0–10.5)
nRBC: 0 % (ref 0.0–0.2)

## 2020-09-10 LAB — BASIC METABOLIC PANEL
Anion gap: 6 (ref 5–15)
BUN: 21 mg/dL (ref 8–23)
CO2: 27 mmol/L (ref 22–32)
Calcium: 8.3 mg/dL — ABNORMAL LOW (ref 8.9–10.3)
Chloride: 103 mmol/L (ref 98–111)
Creatinine, Ser: 0.73 mg/dL (ref 0.61–1.24)
GFR, Estimated: >60 mL/min (ref >60)
Glucose, Bld: 165 mg/dL — ABNORMAL HIGH (ref 70–99)
Potassium: 3.8 mmol/L (ref 3.5–5.1)
Sodium: 136 mmol/L (ref 135–145)

## 2020-09-10 LAB — GLUCOSE, CAPILLARY
Glucose-Capillary: 112 mg/dL — ABNORMAL HIGH (ref 70–99)
Glucose-Capillary: 125 mg/dL — ABNORMAL HIGH (ref 70–99)
Glucose-Capillary: 143 mg/dL — ABNORMAL HIGH (ref 70–99)
Glucose-Capillary: 148 mg/dL — ABNORMAL HIGH (ref 70–99)
Glucose-Capillary: 168 mg/dL — ABNORMAL HIGH (ref 70–99)
Glucose-Capillary: 180 mg/dL — ABNORMAL HIGH (ref 70–99)
Glucose-Capillary: 199 mg/dL — ABNORMAL HIGH (ref 70–99)

## 2020-09-10 LAB — FIBRIN DERIVATIVES D-DIMER (ARMC ONLY): Fibrin derivatives D-dimer (ARMC): 1084.66 ng/mL (FEU) — ABNORMAL HIGH (ref 0.00–499.00)

## 2020-09-10 LAB — URINE CULTURE: Culture: NO GROWTH

## 2020-09-10 LAB — C-REACTIVE PROTEIN: CRP: 5.9 mg/dL — ABNORMAL HIGH (ref <1.0)

## 2020-09-10 LAB — MAGNESIUM: Magnesium: 2.1 mg/dL (ref 1.7–2.4)

## 2020-09-10 NOTE — Progress Notes (Signed)
PROGRESS NOTE    Martin Bean  ZOX:[id number] DOB: 11/29/1929 DOA: 09/08/2020 PCP: [name], [name], MD    Assessment & Plan:   Active Problems:   Pneumonia due to COVID-19 virus   Martin Bean [name] is a 87 y.o. male with medical history significant of  CVA, hypertension, chronic diastolic CHF, and depression, COPD,HLD who from NH due to increase sob in setting of recent covid diagnosis 1/17.   Acute hypoxic respiratory failure --2/2 COVID PNA - CTPA negative for pe --procal neg Plan: --Continue supplemental O2 to keep sats >=90%, wean as tolerated --treat covid  COVID PNA --started on Remdesivir and steroid --CRP 3.9 on presentation, trending up to 7.8 next morning Plan: --cont Remdesivir --cont solumedrol 40 mg q12h --Ensure CRP down-trending --cont daily bronchodilators  Afib, POA --no prior hx.  Currently rate controlled  Hx of COPD --cont Breo and Incruse  Trop elevation 2/2 demand ischemia --trop peaked at 108.  No chest pain  Diastolic CHF -patient w/o acute exacerbation  -CTPA w/o pleural effusion or pulmonary edema  - patient was given lasix in ed x 1 Plan: --cont home lasix 20 mg every other day  CVA --cont home ASA 81 and statin   hypertension --cont home amlodipine and lasix   depression --cont home Zoloft and Remeron   HLD  -continue statin   GERD --cont home PPI  BPH --cont home Flomax   DVT prophylaxis: Lovenox SQ Code Status: Full code  Family Communication:  Status is: inpatient Dispo:   The patient is from: home Anticipated d/c is to: home Anticipated d/c date is: 1-2 days Patient currently is not medically ready to d/c due to: covid on tx with new O2 requirement   Subjective and Interval History:  Pt denied no dyspnea.  Ate ok.  No N/V/D.   Objective: Vitals:   09/10/20 0450 09/10/20 0508 09/10/20 1003 09/10/20 1403  BP: (!) 147/75  123/64 137/78  Pulse: 73  67 65  Resp: 18  14 15   Temp: (!) 96.4 F  (35.8 C) (!) 97.5 F (36.4 C) 98.9 F (37.2 C) 98.1 F (36.7 C)  TempSrc: Axillary Oral    SpO2: 98%  98% 97%  Weight:      Height:        Intake/Output Summary (Last 24 hours) at 09/10/2020 1543 Last data filed at 09/10/2020 0323 Gross per 24 hour  Intake 104.05 ml  Output --  Net 104.05 ml   Filed Weights   09/08/20 1602  Weight: 68 kg    Examination:   Constitutional: NAD, AAOx3 HEENT: conjunctivae and lids normal, EOMI, hard of hearing CV: No cyanosis.   RESP: normal respiratory effort, on 1L Extremities: No effusions, edema in BLE SKIN: warm, dry Neuro: II - XII grossly intact.   Psych: Normal mood and affect.     Data Reviewed: I have personally reviewed following labs and imaging studies  CBC: Recent Labs  Lab 09/08/20 1607 09/09/20 0035 09/10/20 0545  WBC 15.0* 14.3* 14.2*  NEUTROABS 13.3*  --   --   HGB 10.8* 9.9* 10.2*  HCT 31.6* 29.9* 30.5*  MCV 88.3 89.3 88.7  PLT 176 165 169   Basic Metabolic Panel: Recent Labs  Lab 09/08/20 1607 09/08/20 1844 09/09/20 0035 09/10/20 0545  NA  --  131*  --  136  K  --  3.5  --  3.8  CL  --  97*  --  103  CO2  --  22  --  27  GLUCOSE  --  176*  --  165*  BUN  --  12  --  21  CREATININE  --  0.82 0.84 0.73  CALCIUM  --  7.8*  --  8.3*  MG 1.8  --   --  2.1   GFR: Estimated Creatinine Clearance: 61.4 mL/min (by C-G formula based on SCr of 0.73 mg/dL). Liver Function Tests: Recent Labs  Lab 09/08/20 1844  AST 20  ALT 11  ALKPHOS 91  BILITOT 0.8  PROT 6.3*  ALBUMIN 2.9*   No results for input(s): LIPASE, AMYLASE in the last 168 hours. No results for input(s): AMMONIA in the last 168 hours. Coagulation Profile: Recent Labs  Lab 09/08/20 1607  INR 1.2   Cardiac Enzymes: No results for input(s): CKTOTAL, CKMB, CKMBINDEX, TROPONINI in the last 168 hours. BNP (last 3 results) No results for input(s): PROBNP in the last 8760 hours. HbA1C: Recent Labs    09/09/20 0035  HGBA1C 5.7*    CBG: Recent Labs  Lab 09/09/20 1955 09/10/20 0033 09/10/20 0450 09/10/20 1001 09/10/20 1401  GLUCAP 159* 112* 143* 168* 199*   Lipid Profile: Recent Labs    09/08/20 1607  TRIG 65   Thyroid Function Tests: No results for input(s): TSH, T4TOTAL, FREET4, T3FREE, THYROIDAB in the last 72 hours. Anemia Panel: Recent Labs    09/08/20 1607 09/09/20 0329  FERRITIN 102 94   Sepsis Labs: Recent Labs  Lab 09/08/20 1607 09/08/20 1706  PROCALCITON <0.10  --   LATICACIDVEN 1.6 1.4    Recent Results (from the past 240 hour(s))  Urine culture     Status: None   Collection Time: 09/08/20 12:18 PM   Specimen: Urine, Random  Result Value Ref Range Status   Specimen Description   Final    URINE, RANDOM Performed at [facility], [address] [place], [place] [address]    Special Requests   Final    NONE Performed at [facility], [address], [place], [place] [address]    Culture   Final    NO GROWTH Performed at [facility], [address], [place], [place] [address]    Report Status 09/10/2020 FINAL  Final  Blood culture (routine x 2)     Status: None (Preliminary result)   Collection Time: 09/08/20  5:06 PM   Specimen: BLOOD  Result Value Ref Range Status   Specimen Description BLOOD BLOOD RIGHT WRIST  Final   Special Requests   Final    BOTTLES DRAWN AEROBIC AND ANAEROBIC Blood Culture adequate volume   Culture   Final    NO GROWTH 2 DAYS Performed at [facility], [address], [place], [place] [address]    Report Status PENDING  Incomplete  SARS Coronavirus 2 by RT PCR (hospital order, performed in [facility] hospital lab) Nasopharyngeal Nasopharyngeal Swab     Status: Abnormal   Collection Time: 09/08/20  5:06 PM   Specimen: Nasopharyngeal Swab  Result Value Ref Range Status   SARS Coronavirus 2 POSITIVE (A) NEGATIVE Final    Comment: RESULT CALLED TO, READ BACK BY AND VERIFIED WITH: REED RENO AT 1912 ON  09/08/20 BY SS (NOTE) SARS-CoV-2 target nucleic acids are DETECTED  SARS-CoV-2 RNA is generally detectable in upper respiratory specimens  during the acute phase of infection.  Positive results are indicative  of the presence of the identified virus, but do not rule out bacterial infection or co-infection with other pathogens  not detected by the test.  Clinical correlation with patient history and  other diagnostic information is necessary to determine patient infection status.  The expected result is negative.  Fact Sheet for Patients:   BoilerBrush.com.cy   Fact Sheet for Healthcare Providers:   https://pope.com/    This test is not yet approved or cleared by the [place] FDA and  has been authorized for detection and/or diagnosis of SARS-CoV-2 by FDA under an Emergency Use Authorization (EUA).  This EUA will remain in effect (meaning this te st can be used) for the duration of  the COVID-19 declaration under Section 564(b)(1) of the Act, 21 U.S.C. section 360-bbb-3(b)(1), unless the authorization is terminated or revoked sooner.  Performed at [facility], [address] [place], [place] [address]   Blood culture (routine x 2)     Status: None (Preliminary result)   Collection Time: 09/08/20  5:19 PM   Specimen: BLOOD  Result Value Ref Range Status   Specimen Description BLOOD BLOOD LEFT HAND  Final   Special Requests   Final    BOTTLES DRAWN AEROBIC AND ANAEROBIC Blood Culture results may not be optimal due to an inadequate volume of blood received in culture bottles   Culture   Final    NO GROWTH 2 DAYS Performed at [facility], [address] [place], [place] [address]    Report Status PENDING  Incomplete      Radiology Studies: DG Chest 1 View  Result Date: 09/08/2020 CLINICAL DATA:  Short of breath, COVID EXAM: CHEST  1 VIEW COMPARISON:  07/18/2020, CT chest 07/20/2020 FINDINGS: Small  pleural effusions. Patchy basilar airspace opacities. Stable cardiomediastinal silhouette with aortic atherosclerosis. IMPRESSION: Small pleural effusions with patchy basilar airspace opacities, suspect for pneumonia. Electronically Signed   By: Jasmine Pang M.D.   On: 09/08/2020 16:52   CT Angio Chest PE W and/or Wo Contrast  Result Date: 09/08/2020 CLINICAL DATA:  History of COVID-19 positivity with shortness of breath and hypoxia EXAM: CT ANGIOGRAPHY CHEST WITH CONTRAST TECHNIQUE: Multidetector CT imaging of the chest was performed using the standard protocol during bolus administration of intravenous contrast. Multiplanar CT image reconstructions and MIPs were obtained to evaluate the vascular anatomy. CONTRAST:  20mL OMNIPAQUE IOHEXOL 350 MG/ML SOLN COMPARISON:  Plain film from earlier in the same day, CT from 07/20/2020. FINDINGS: Cardiovascular: Thoracic aorta demonstrates atherosclerotic calcifications. No aneurysmal dilatation or dissection is seen. No cardiac enlargement is seen. Coronary calcifications are noted. The pulmonary artery is well visualized bilaterally within normal branching pattern. No filling defect to suggest pulmonary embolism is noted. Mediastinum/Nodes: Thoracic inlet is within normal limits. No sizable hilar or mediastinal adenopathy is noted. The esophagus is distended with air. Lungs/Pleura: Emphysematous changes are noted bilaterally. Patchy airspace opacity is seen predominately within the lung bases consistent with the given clinical history of COVID-19 positivity. No pneumothorax is seen. No sizable parenchymal nodule is noted. Bibasilar scarring is noted stable from a prior CT. Upper Abdomen: Visualized upper abdomen is within normal limits. Musculoskeletal: Degenerative changes of the thoracic spine are noted. Old rib fractures with healing are seen on the right. Review of the MIP images confirms the above findings. IMPRESSION: No evidence of pulmonary emboli. Patchy  airspace opacities in the lungs consistent with the given clinical history of COVID-19 positivity. Stable bibasilar scarring unchanged from December of 2021. Aortic Atherosclerosis (ICD10-I70.0) and Emphysema (ICD10-J43.9). Electronically Signed   By: Alcide Clever M.D.   On: 09/08/2020 20:02  Scheduled Meds: . amLODipine  2.5 mg Oral Daily  . aspirin EC  81 mg Oral Daily  . atorvastatin  40 mg Oral q1800  . enoxaparin (LOVENOX) injection  40 mg Subcutaneous Q24H  . fluticasone furoate-vilanterol  1 puff Inhalation q morning - 10a   And  . umeclidinium bromide  1 puff Inhalation q morning - 10a  . furosemide  20 mg Oral QODAY  . insulin aspart  0-15 Units Subcutaneous Q4H  . methylPREDNISolone (SOLU-MEDROL) injection  40 mg Intravenous Q12H  . mirtazapine  7.5 mg Oral QHS  . multivitamin with minerals  1 tablet Oral Daily  . pantoprazole  40 mg Oral Daily  . sertraline  50 mg Oral Daily  . sodium chloride flush  3 mL Intravenous Q12H  . sodium chloride  1 g Oral TID WC  . tamsulosin  0.4 mg Oral Daily   Continuous Infusions: . remdesivir 100 mg in NS 100 mL 100 mg (09/10/20 1439)     LOS: 2 days     Darlin Priestly, MD [facility] If 7PM-7AM, please contact night-coverage 09/10/2020, 3:43 PM

## 2020-09-10 NOTE — TOC Initial Note (Signed)
Transition of Care [facility]) - Initial/Assessment Note    Patient Details  Name: Martin Bean MRN: [id number] Date of Birth: 04-02-1930  Transition of Care [facility]) CM/SW Contact:    Delphia Grates, LCSW Phone Number: 09/10/2020, 2:55 PM  Clinical Narrative:                  Patient arrived from [facility] as a resident there for over 2 years to the hospital due to shortness of breath due to COVID. Patient's son/HCPOA, Martin Bean, provided answers for assessment. Patient's son states that the patient was in the hospital a month ago for pneumonia and was doing well. Patient got COVID on 1/17 and had a hard time breathing so [facility] called EMS. Patient will hopefully be going back to [facility]. CSW called [facility] who stated that he is able to come back but to call them once discharged to make sure. [place] stated that he had COVID before going to the hospital so that they will just continue to quarantine him when he is discharged back. Patient's son stated that the patient did have some physical therapy while at [facility].   Patient's PCP: Marcelino Duster, MD Pharmacy: [facility] in [place], [place] off of [address].   No other needs at this time. Trying to wean patient off of oxygen.   Expected Discharge Plan: Assisted Living [facility]) Barriers to Discharge: Continued Medical Work up   Patient Goals and CMS Choice Patient states their goals for this hospitalization and ongoing recovery are:: Patient's son/HCPOA, Martin Bean, stated "get out of hospital and get better" CMS Medicare.gov Compare Post Acute Care list provided to:: Patient Represenative (must comment) (Patient's son/HCPOA) Choice offered to / list presented to : [facility] POA / Guardian  Expected Discharge Plan and Services Expected Discharge Plan: Assisted Living [facility])       Living arrangements for the past 2 months: Assisted Living Facility                                      Prior  Living Arrangements/Services Living arrangements for the past 2 months: Assisted Living Facility Lives with:: Other (Comment) (Other facility residents) Patient language and need for interpreter reviewed:: Yes Do you feel safe going back to the place where you live?: Yes      Need for Family Participation in Patient Care: Yes (Comment) (Patient's son/hcpoa and daughters) Care giver support system in place?: Yes (comment) (patient's son/hcpoa and daughters) Current home services: DME (walkers)    Activities of Daily Living Home Assistive Devices/Equipment: Environmental consultant (specify type),Grab bars around toilet,Eyeglasses ADL Screening (condition at time of admission) Patient's cognitive ability adequate to safely complete daily activities?: Yes Is the patient deaf or have difficulty hearing?: Yes Does the patient have difficulty seeing, even when wearing glasses/contacts?: No Does the patient have difficulty concentrating, remembering, or making decisions?: No Patient able to express need for assistance with ADLs?: Yes Does the patient have difficulty dressing or bathing?: Yes Independently performs ADLs?: Yes (appropriate for developmental age) Does the patient have difficulty walking or climbing stairs?: Yes Weakness of Legs: Both Weakness of Arms/Hands: Both  Permission Sought/Granted                  Emotional Assessment Appearance:: Other (Comment Required (Talked with patient's son/HCPOA) Attitude/Demeanor/Rapport: Unable to Assess Affect (typically observed): Unable to Assess Orientation: : Oriented  to Self,Oriented to Place,Oriented to  Time Alcohol / Substance Use: Not Applicable Psych Involvement: No (comment)  Admission diagnosis:  COPD exacerbation (HCC) [J44.1] Acute respiratory failure with hypoxia (HCC) [J96.01] Troponin I above reference range [R77.8] Acute on chronic congestive heart failure, unspecified heart failure type (HCC) [I50.9] COVID [U07.1] Pneumonia due  to COVID-19 virus [U07.1, J12.82] Patient Active Problem List   Diagnosis Date Noted  . Pneumonia due to COVID-19 virus 09/08/2020  . Protein-calorie malnutrition, severe 07/28/2020  . Leg pain   . COPD with acute exacerbation (HCC)   . Acute respiratory failure with hypoxia (HCC)   . Weakness   . Acute on chronic diastolic CHF (congestive heart failure) (HCC) 07/17/2020  . Hyponatremia 07/17/2020  . Hypertensive urgency 07/17/2020  . Bilateral pneumonia 01/17/2019  . CAP (community acquired pneumonia) 12/28/2018  . Acute renal failure (ARF) (HCC) 12/28/2018  . Multifocal pneumonia 12/28/2018  . Hemothorax   . Malnutrition of moderate degree 11/02/2017  . Hydropneumothorax 11/01/2017  . Essential hypertension 10/09/2017  . Hyperlipidemia 10/09/2017  . Carotid arterial disease (HCC) 10/02/2017  . Carotid artery stenosis, unilateral, left 09/04/2017  . CVA (cerebral vascular accident) (HCC) 08/14/2017  . Benign prostatic hyperplasia with urinary frequency 06/03/2017  . Depression, prolonged 06/03/2017  . 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 Psoriasis, unspecified 06/03/2017   PCP:  Gracelyn Nurse, MD Pharmacy:   [facility] [address] [place] - [address] [place] [address] Phone: [phone] Fax: [phone]     Social Determinants of Health (SDOH) Interventions    Readmission Risk Interventions Readmission Risk Prevention Plan 01/19/2019  Transportation Screening Complete  PCP or Specialist Appt within 5-7 Days Complete  Home Care Screening Complete  Medication Review (RN CM) Complete  Some recent data might be hidden

## 2020-09-11 DIAGNOSIS — D649 Anemia, unspecified: Secondary | ICD-10-CM

## 2020-09-11 LAB — CBC
HCT: 32.2 % — ABNORMAL LOW (ref 39.0–52.0)
Hemoglobin: 10.9 g/dL — ABNORMAL LOW (ref 13.0–17.0)
MCH: 29.6 pg (ref 26.0–34.0)
MCHC: 33.9 g/dL (ref 30.0–36.0)
MCV: 87.5 fL (ref 80.0–100.0)
Platelets: 169 10*3/uL (ref 150–400)
RBC: 3.68 MIL/uL — ABNORMAL LOW (ref 4.22–5.81)
RDW: 15.1 % (ref 11.5–15.5)
WBC: 11.4 10*3/uL — ABNORMAL HIGH (ref 4.0–10.5)
nRBC: 0 % (ref 0.0–0.2)

## 2020-09-11 LAB — BASIC METABOLIC PANEL
Anion gap: 8 (ref 5–15)
BUN: 29 mg/dL — ABNORMAL HIGH (ref 8–23)
CO2: 27 mmol/L (ref 22–32)
Calcium: 8.2 mg/dL — ABNORMAL LOW (ref 8.9–10.3)
Chloride: 101 mmol/L (ref 98–111)
Creatinine, Ser: 0.71 mg/dL (ref 0.61–1.24)
GFR, Estimated: >60 mL/min (ref >60)
Glucose, Bld: 157 mg/dL — ABNORMAL HIGH (ref 70–99)
Potassium: 3.6 mmol/L (ref 3.5–5.1)
Sodium: 136 mmol/L (ref 135–145)

## 2020-09-11 LAB — GLUCOSE, CAPILLARY
Glucose-Capillary: 154 mg/dL — ABNORMAL HIGH (ref 70–99)
Glucose-Capillary: 152 mg/dL — ABNORMAL HIGH (ref 70–99)
Glucose-Capillary: 146 mg/dL — ABNORMAL HIGH (ref 70–99)
Glucose-Capillary: 164 mg/dL — ABNORMAL HIGH (ref 70–99)
Glucose-Capillary: 159 mg/dL — ABNORMAL HIGH (ref 70–99)

## 2020-09-11 LAB — MAGNESIUM: Magnesium: 2.2 mg/dL (ref 1.7–2.4)

## 2020-09-11 LAB — C-REACTIVE PROTEIN: CRP: 2.6 mg/dL — ABNORMAL HIGH (ref <1.0)

## 2020-09-11 NOTE — Progress Notes (Signed)
PROGRESS NOTE    Martin Bean  NLG:[id number] DOB: 1929/12/14 DOA: 09/08/2020 PCP: Gracelyn Nurse, MD    Assessment & Plan:   Active Problems:   Pneumonia due to COVID-19 virus   Martin Bean is a 87 y.o. male with medical history significant of  CVA, hypertension, chronic diastolic CHF, and depression, COPD,HLD who from NH due to increase sob in setting of recent covid diagnosis 1/17.   Acute hypoxic respiratory failure --2/2 COVID PNA - CTPA negative for pe --procal neg Plan: --Continue supplemental O2 to keep sats >=90%, wean as tolerated --treat covid  COVID PNA --started on Remdesivir and steroid --CRP 3.9 on presentation, trending up to 7.8 next morning, now trending down Plan: --cont Remdesivir --cont solumedrol 40 mg q12h --Ensure CRP down-trending --cont daily bronchodilators  Afib, POA --no prior hx.  Currently rate controlled  Hx of COPD --not in exacerbation. --cont Breo and Incruse  Trop elevation 2/2 demand ischemia --trop peaked at 108.  No chest pain  Diastolic CHF -patient w/o acute exacerbation  -CTPA w/o pleural effusion or pulmonary edema  - patient was given lasix in ed x 1 Plan: --cont home lasix 20 mg every other day  CVA --cont home ASA 81 and statin   hypertension --BP 120's-150's --cont home amlodipine and lasix   depression --cont home Zoloft and Remeron   HLD  -continue statin   GERD --cont home PPI  BPH --cont home Flomax  Anemia --Hgb 9-10 --anemia workup  Weakness --PT eval   DVT prophylaxis: Lovenox SQ Code Status: Full code  Family Communication:  Status is: inpatient Dispo:   The patient is from: home Anticipated d/c is to: home Anticipated d/c date is: 1-2 days Patient currently is not medically ready to d/c due to: covid on tx with new O2 requirement, need PT eval   Subjective and Interval History:  Pt reported breathing ok.  Eating ok.  No N/V/D.  Pt said he didn't know if he  could get up and walk.   Objective: Vitals:   09/11/20 0854 09/11/20 1100 09/11/20 1634 09/11/20 1938  BP: (!) 153/94 139/72 137/78 133/65  Pulse: 83 86 71 62  Resp: 16 16 18 18   Temp: (!) 97.3 F (36.3 C) (!) 97.5 F (36.4 C) 98.5 F (36.9 C) 97.6 F (36.4 C)  TempSrc:    Oral  SpO2: 97% 98% 97% 97%  Weight:      Height:        Intake/Output Summary (Last 24 hours) at 09/11/2020 2149 Last data filed at 09/11/2020 09/13/2020 Gross per 24 hour  Intake --  Output 1000 ml  Net -1000 ml   Filed Weights   09/08/20 1602  Weight: 68 kg    Examination:   Constitutional: NAD, AAOx3 HEENT: conjunctivae and lids normal, EOMI, hard of hearing CV: No cyanosis.   RESP: normal respiratory effort, on 1L Extremities: No effusions, edema in BLE SKIN: warm, dry Neuro: II - XII grossly intact.   Psych: Normal mood and affect.     Data Reviewed: I have personally reviewed following labs and imaging studies  CBC: Recent Labs  Lab 09/08/20 1607 09/09/20 0035 09/10/20 0545 09/11/20 0600  WBC 15.0* 14.3* 14.2* 11.4*  NEUTROABS 13.3*  --   --   --   HGB 10.8* 9.9* 10.2* 10.9*  HCT 31.6* 29.9* 30.5* 32.2*  MCV 88.3 89.3 88.7 87.5  PLT 176 165 169 169   Basic Metabolic Panel: Recent Labs  Lab 09/08/20  1607 09/08/20 1844 09/09/20 0035 09/10/20 0545 09/11/20 0600  NA  --  131*  --  136 136  K  --  3.5  --  3.8 3.6  CL  --  97*  --  103 101  CO2  --  22  --  27 27  GLUCOSE  --  176*  --  165* 157*  BUN  --  12  --  21 29*  CREATININE  --  0.82 0.84 0.73 0.71  CALCIUM  --  7.8*  --  8.3* 8.2*  MG 1.8  --   --  2.1 2.2   GFR: Estimated Creatinine Clearance: 61.4 mL/min (by C-G formula based on SCr of 0.71 mg/dL). Liver Function Tests: Recent Labs  Lab 09/08/20 1844  AST 20  ALT 11  ALKPHOS 91  BILITOT 0.8  PROT 6.3*  ALBUMIN 2.9*   No results for input(s): LIPASE, AMYLASE in the last 168 hours. No results for input(s): AMMONIA in the last 168 hours. Coagulation  Profile: Recent Labs  Lab 09/08/20 1607  INR 1.2   Cardiac Enzymes: No results for input(s): CKTOTAL, CKMB, CKMBINDEX, TROPONINI in the last 168 hours. BNP (last 3 results) No results for input(s): PROBNP in the last 8760 hours. HbA1C: Recent Labs    09/09/20 0035  HGBA1C 5.7*   CBG: Recent Labs  Lab 09/11/20 0418 09/11/20 0856 09/11/20 1216 09/11/20 1633 09/11/20 1938  GLUCAP 154* 152* 146* 164* 159*   Lipid Profile: No results for input(s): CHOL, HDL, LDLCALC, TRIG, CHOLHDL, LDLDIRECT in the last 72 hours. Thyroid Function Tests: No results for input(s): TSH, T4TOTAL, FREET4, T3FREE, THYROIDAB in the last 72 hours. Anemia Panel: Recent Labs    09/09/20 0329  FERRITIN 94   Sepsis Labs: Recent Labs  Lab 09/08/20 1607 09/08/20 1706  PROCALCITON <0.10  --   LATICACIDVEN 1.6 1.4    Recent Results (from the past 240 hour(s))  Urine culture     Status: None   Collection Time: 09/08/20 12:18 PM   Specimen: Urine, Random  Result Value Ref Range Status   Specimen Description   Final    URINE, RANDOM Performed at [facility], [address] [place], [place] [address]    Special Requests   Final    NONE Performed at [facility], [address], [place], [place] [address]    Culture   Final    NO GROWTH Performed at [facility], [address], [place], [place] [address]    Report Status 09/10/2020 FINAL  Final  Blood culture (routine x 2)     Status: None (Preliminary result)   Collection Time: 09/08/20  5:06 PM   Specimen: BLOOD  Result Value Ref Range Status   Specimen Description BLOOD BLOOD RIGHT WRIST  Final   Special Requests   Final    BOTTLES DRAWN AEROBIC AND ANAEROBIC Blood Culture adequate volume   Culture   Final    NO GROWTH 3 DAYS Performed at [facility], [address], [place], [place] [address]    Report Status PENDING  Incomplete  SARS Coronavirus 2 by RT PCR (hospital order, performed in [facility] hospital lab) Nasopharyngeal Nasopharyngeal Swab     Status: Abnormal   Collection Time: 09/08/20  5:06 PM   Specimen: Nasopharyngeal Swab  Result Value Ref Range Status   SARS Coronavirus 2 POSITIVE (A) NEGATIVE Final    Comment: RESULT CALLED TO, READ BACK BY AND VERIFIED WITH:  REED RENO AT 1912 ON 09/08/20 BY SS (NOTE) SARS-CoV-2 target nucleic acids are DETECTED  SARS-CoV-2 RNA is generally detectable in upper respiratory specimens  during the acute phase of infection.  Positive results are indicative  of the presence of the identified virus, but do not rule out bacterial infection or co-infection with other pathogens not detected by the test.  Clinical correlation with patient history and  other diagnostic information is necessary to determine patient infection status.  The expected result is negative.  Fact Sheet for Patients:   BoilerBrush.com.cy   Fact Sheet for Healthcare Providers:   https://pope.com/    This test is not yet approved or cleared by the [place] FDA and  has been authorized for detection and/or diagnosis of SARS-CoV-2 by FDA under an Emergency Use Authorization (EUA).  This EUA will remain in effect (meaning this te st can be used) for the duration of  the COVID-19 declaration under Section 564(b)(1) of the Act, 21 U.S.C. section 360-bbb-3(b)(1), unless the authorization is terminated or revoked sooner.  Performed at [facility], [address] [place], [place] [address]   Blood culture (routine x 2)     Status: None (Preliminary result)   Collection Time: 09/08/20  5:19 PM   Specimen: BLOOD  Result Value Ref Range Status   Specimen Description BLOOD BLOOD LEFT HAND  Final   Special Requests   Final    BOTTLES DRAWN AEROBIC AND ANAEROBIC Blood Culture results may not be optimal due to an inadequate volume of blood received in culture bottles   Culture   Final    NO GROWTH 3  DAYS Performed at [facility], [address] [place], [place] [address]    Report Status PENDING  Incomplete      Radiology Studies: No results found.   Scheduled Meds: . amLODipine  2.5 mg Oral Daily  . aspirin EC  81 mg Oral Daily  . atorvastatin  40 mg Oral q1800  . enoxaparin (LOVENOX) injection  40 mg Subcutaneous Q24H  . fluticasone furoate-vilanterol  1 puff Inhalation q morning - 10a   And  . umeclidinium bromide  1 puff Inhalation q morning - 10a  . furosemide  20 mg Oral QODAY  . insulin aspart  0-15 Units Subcutaneous Q4H  . methylPREDNISolone (SOLU-MEDROL) injection  40 mg Intravenous Q12H  . mirtazapine  7.5 mg Oral QHS  . multivitamin with minerals  1 tablet Oral Daily  . pantoprazole  40 mg Oral Daily  . sertraline  50 mg Oral Daily  . sodium chloride flush  3 mL Intravenous Q12H  . sodium chloride  1 g Oral TID WC  . tamsulosin  0.4 mg Oral Daily   Continuous Infusions: . remdesivir 100 mg in NS 100 mL 100 mg (09/11/20 1030)     LOS: 3 days     Darlin Priestly, MD [facility] If 7PM-7AM, please contact night-coverage 09/11/2020, 9:49 PM

## 2020-09-12 LAB — CBC
HCT: 33.6 % — ABNORMAL LOW (ref 39.0–52.0)
Hemoglobin: 11.2 g/dL — ABNORMAL LOW (ref 13.0–17.0)
MCH: 29.4 pg (ref 26.0–34.0)
MCHC: 33.3 g/dL (ref 30.0–36.0)
MCV: 88.2 fL (ref 80.0–100.0)
Platelets: 178 10*3/uL (ref 150–400)
RBC: 3.81 MIL/uL — ABNORMAL LOW (ref 4.22–5.81)
RDW: 14.9 % (ref 11.5–15.5)
WBC: 8.1 10*3/uL (ref 4.0–10.5)
nRBC: 0 % (ref 0.0–0.2)

## 2020-09-12 LAB — IRON AND TIBC
Iron: 46 ug/dL (ref 45–182)
Saturation Ratios: 23 % (ref 17.9–39.5)
TIBC: 203 ug/dL — ABNORMAL LOW (ref 250–450)
UIBC: 157 ug/dL

## 2020-09-12 LAB — BASIC METABOLIC PANEL
Anion gap: 9 (ref 5–15)
BUN: 31 mg/dL — ABNORMAL HIGH (ref 8–23)
CO2: 29 mmol/L (ref 22–32)
Calcium: 8.2 mg/dL — ABNORMAL LOW (ref 8.9–10.3)
Chloride: 99 mmol/L (ref 98–111)
Creatinine, Ser: 0.68 mg/dL (ref 0.61–1.24)
GFR, Estimated: >60 mL/min (ref >60)
Glucose, Bld: 165 mg/dL — ABNORMAL HIGH (ref 70–99)
Potassium: 3.6 mmol/L (ref 3.5–5.1)
Sodium: 137 mmol/L (ref 135–145)

## 2020-09-12 LAB — FOLATE: Folate: 15.8 ng/mL (ref >5.9)

## 2020-09-12 LAB — GLUCOSE, CAPILLARY
Glucose-Capillary: 165 mg/dL — ABNORMAL HIGH (ref 70–99)
Glucose-Capillary: 146 mg/dL — ABNORMAL HIGH (ref 70–99)
Glucose-Capillary: 127 mg/dL — ABNORMAL HIGH (ref 70–99)
Glucose-Capillary: 192 mg/dL — ABNORMAL HIGH (ref 70–99)
Glucose-Capillary: 287 mg/dL — ABNORMAL HIGH (ref 70–99)
Glucose-Capillary: 123 mg/dL — ABNORMAL HIGH (ref 70–99)

## 2020-09-12 LAB — VITAMIN B12: Vitamin B-12: 434 pg/mL (ref 180–914)

## 2020-09-12 LAB — MAGNESIUM: Magnesium: 2.2 mg/dL (ref 1.7–2.4)

## 2020-09-12 LAB — C-REACTIVE PROTEIN: CRP: 1.1 mg/dL — ABNORMAL HIGH (ref <1.0)

## 2020-09-12 NOTE — TOC Progression Note (Signed)
Transition of Care [facility]) - Progression Note    Patient Details  Name: TREK KIMBALL MRN: [id number] Date of Birth: 02/21/1930  Transition of Care [facility]) CM/SW Contact  Allayne Butcher, RN Phone Number: 09/12/2020, 3:19 PM  Clinical Narrative:    RNCM spoke with patient's daughter both [name] and [name].  SNF recommendation by PT was discussed.  Patient was recently at [facility].  Family would prefer that patient return to [facility] and get home health therapy there.  RNCM has spoken with [facility] and they will come out to evaluate patient to see if they can accept him back.  Updated notes faxed to [facility] at [phone].     Expected Discharge Plan: Assisted Living [facility]) Barriers to Discharge: Continued Medical Work up  Expected Discharge Plan and Services Expected Discharge Plan: Assisted Living [facility])       Living arrangements for the past 2 months: Assisted Living Facility                                       Social Determinants of Health (SDOH) Interventions    Readmission Risk Interventions Readmission Risk Prevention Plan 01/19/2019  Transportation Screening Complete  PCP or Specialist Appt within 5-7 Days Complete  Home Care Screening Complete  Medication Review (RN CM) Complete  Some recent data might be hidden

## 2020-09-12 NOTE — Progress Notes (Signed)
0030 blood sugar is 89

## 2020-09-12 NOTE — Progress Notes (Addendum)
PROGRESS NOTE    Martin Bean  SAY:[id number] DOB: [birth year] DOA: 09/08/2020 PCP: Gracelyn Nurse, MD    Assessment & Plan:   Active Problems:   Pneumonia due to COVID-19 virus   Martin Bean is a 87 y.o. male with medical history significant of  CVA, hypertension, chronic diastolic CHF, and depression, COPD,HLD who from NH due to increase sob in setting of recent covid diagnosis 1/17.   Acute hypoxic respiratory failure --2/2 COVID PNA - CTPA negative for pe --procal neg Plan: --Continue supplemental O2 to keep sats >=90%, wean as tolerated --treat covid  COVID PNA --started on Remdesivir and steroid --CRP 3.9 on presentation, trending up to 7.8 next morning, now trending down Plan: --cont Remdesivir --cont solumedrol 40 mg q12h --Ensure CRP down-trending --cont daily bronchodilators  Afib, POA --no prior hx.  Currently rate controlled  Hx of COPD --not in exacerbation. --cont Breo and Incruse  Trop elevation 2/2 demand ischemia --trop peaked at 108.  No chest pain  Diastolic CHF -patient w/o acute exacerbation  -CTPA w/o pleural effusion or pulmonary edema  - patient was given lasix in ed x 1 Plan: --cont home lasix 20 mg every other day  CVA --cont home ASA 81 and statin   hypertension --BP 120's-150's --cont home amlodipine and lasix   depression --cont home Zoloft and Remeron   HLD  -continue statin   GERD --cont home PPI  BPH --cont home Flomax  Anemia --Hgb 9-10 --anemia workup  Weakness --PT eval rec SNF.  [facility] has agreed for patient to return at discharge.   DVT prophylaxis: Lovenox SQ Code Status: Full code  Family Communication:  Daughter updated on the phone today Status is: inpatient Dispo:   The patient is from: assisted living Anticipated d/c is to: assisted living Anticipated d/c date is: tomorrow Patient currently is medically ready to d/c.   Subjective and Interval History:  No N/V/D.   Pt off oxygen.  PT worked with pt and found him to be very weak.   Objective: Vitals:   09/12/20 0415 09/12/20 0806 09/12/20 1118 09/12/20 1519  BP: (!) 156/76 (!) 158/95 128/71 109/68  Pulse: 66 79 86 77  Resp: 18 18 16 14   Temp: (!) 97.4 F (36.3 C) (!) 97.5 F (36.4 C) 97.8 F (36.6 C) 98.1 F (36.7 C)  TempSrc: Oral Oral    SpO2: 99% 98% 95% 95%  Weight:      Height:        Intake/Output Summary (Last 24 hours) at 09/12/2020 1623 Last data filed at 09/12/2020 1423 Gross per 24 hour  Intake --  Output 1770 ml  Net -1770 ml   Filed Weights   09/08/20 1602  Weight: 68 kg    Examination:   Constitutional: NAD, alert, oriented to self, sitting in chair  HEENT: conjunctivae and lids normal, EOMI CV: No cyanosis.   RESP: normal respiratory effort, on RA Extremities: No effusions, edema in BLE SKIN: warm, dry Neuro: II - XII grossly intact.     Data Reviewed: I have personally reviewed following labs and imaging studies  CBC: Recent Labs  Lab 09/08/20 1607 09/09/20 0035 09/10/20 0545 09/11/20 0600 09/12/20 0654  WBC 15.0* 14.3* 14.2* 11.4* 8.1  NEUTROABS 13.3*  --   --   --   --   HGB 10.8* 9.9* 10.2* 10.9* 11.2*  HCT 31.6* 29.9* 30.5* 32.2* 33.6*  MCV 88.3 89.3 88.7 87.5 88.2  PLT 176 165 169 169 178  Basic Metabolic Panel: Recent Labs  Lab 09/08/20 1607 09/08/20 1844 09/09/20 0035 09/10/20 0545 09/11/20 0600 09/12/20 0654  NA  --  131*  --  136 136 137  K  --  3.5  --  3.8 3.6 3.6  CL  --  97*  --  103 101 99  CO2  --  22  --  27 27 29   GLUCOSE  --  176*  --  165* 157* 165*  BUN  --  12  --  21 29* 31*  CREATININE  --  0.82 0.84 0.73 0.71 0.68  CALCIUM  --  7.8*  --  8.3* 8.2* 8.2*  MG 1.8  --   --  2.1 2.2 2.2   GFR: Estimated Creatinine Clearance: 61.4 mL/min (by C-G formula based on SCr of 0.68 mg/dL). Liver Function Tests: Recent Labs  Lab 09/08/20 1844  AST 20  ALT 11  ALKPHOS 91  BILITOT 0.8  PROT 6.3*  ALBUMIN 2.9*   No  results for input(s): LIPASE, AMYLASE in the last 168 hours. No results for input(s): AMMONIA in the last 168 hours. Coagulation Profile: Recent Labs  Lab 09/08/20 1607  INR 1.2   Cardiac Enzymes: No results for input(s): CKTOTAL, CKMB, CKMBINDEX, TROPONINI in the last 168 hours. BNP (last 3 results) No results for input(s): PROBNP in the last 8760 hours. HbA1C: No results for input(s): HGBA1C in the last 72 hours. CBG: Recent Labs  Lab 09/12/20 0552 09/12/20 0808 09/12/20 0846 09/12/20 1124 09/12/20 1521  GLUCAP 165* 146* 127* 192* 287*   Lipid Profile: No results for input(s): CHOL, HDL, LDLCALC, TRIG, CHOLHDL, LDLDIRECT in the last 72 hours. Thyroid Function Tests: No results for input(s): TSH, T4TOTAL, FREET4, T3FREE, THYROIDAB in the last 72 hours. Anemia Panel: Recent Labs    09/12/20 0654  VITAMINB12 434  FOLATE 15.8  TIBC 203*  IRON 46   Sepsis Labs: Recent Labs  Lab 09/08/20 1607 09/08/20 1706  PROCALCITON <0.10  --   LATICACIDVEN 1.6 1.4    Recent Results (from the past 240 hour(s))  Urine culture     Status: None   Collection Time: 09/08/20 12:18 PM   Specimen: Urine, Random  Result Value Ref Range Status   Specimen Description   Final    URINE, RANDOM Performed at [facility], [address], [place], [place]    Special Requests   Final    NONE Performed at [facility], [address] [place], [place]    Culture   Final    NO GROWTH Performed at [facility], [address] [facility]. [address] [place], [place]    Report Status 09/10/2020 FINAL  Final  Blood culture (routine x 2)     Status: None (Preliminary result)   Collection Time: 09/08/20  5:06 PM   Specimen: BLOOD  Result Value Ref Range Status   Specimen Description BLOOD BLOOD RIGHT WRIST  Final   Special Requests   Final    BOTTLES DRAWN AEROBIC AND ANAEROBIC Blood Culture adequate volume   Culture   Final    NO GROWTH 4  DAYS Performed at [facility], [address], [place], [place]    Report Status PENDING  Incomplete  SARS Coronavirus 2 by RT PCR (hospital order, performed in [facility] hospital lab) Nasopharyngeal Nasopharyngeal Swab     Status: Abnormal   Collection Time: 09/08/20  5:06 PM   Specimen: Nasopharyngeal Swab  Result  Value Ref Range Status   SARS Coronavirus 2 POSITIVE (A) NEGATIVE Final    Comment: RESULT CALLED TO, READ BACK BY AND VERIFIED WITH: REED RENO AT 1912 ON 09/08/20 BY SS (NOTE) SARS-CoV-2 target nucleic acids are DETECTED  SARS-CoV-2 RNA is generally detectable in upper respiratory specimens  during the acute phase of infection.  Positive results are indicative  of the presence of the identified virus, but do not rule out bacterial infection or co-infection with other pathogens not detected by the test.  Clinical correlation with patient history and  other diagnostic information is necessary to determine patient infection status.  The expected result is negative.  Fact Sheet for Patients:   BoilerBrush.com.cy   Fact Sheet for Healthcare Providers:   https://pope.com/    This test is not yet approved or cleared by the [place] FDA and  has been authorized for detection and/or diagnosis of SARS-CoV-2 by FDA under an Emergency Use Authorization (EUA).  This EUA will remain in effect (meaning this te st can be used) for the duration of  the COVID-19 declaration under Section 564(b)(1) of the Act, 21 U.S.C. section 360-bbb-3(b)(1), unless the authorization is terminated or revoked sooner.  Performed at [facility], [address] [place], [place] [address]   Blood culture (routine x 2)     Status: None (Preliminary result)   Collection Time: 09/08/20  5:19 PM   Specimen: BLOOD  Result Value Ref Range Status   Specimen Description BLOOD BLOOD LEFT HAND  Final   Special Requests    Final    BOTTLES DRAWN AEROBIC AND ANAEROBIC Blood Culture results may not be optimal due to an inadequate volume of blood received in culture bottles   Culture   Final    NO GROWTH 4 DAYS Performed at [facility], [address] [place], [place] [address]    Report Status PENDING  Incomplete      Radiology Studies: No results found.   Scheduled Meds: . amLODipine  2.5 mg Oral Daily  . aspirin EC  81 mg Oral Daily  . atorvastatin  40 mg Oral q1800  . enoxaparin (LOVENOX) injection  40 mg Subcutaneous Q24H  . fluticasone furoate-vilanterol  1 puff Inhalation q morning - 10a   And  . umeclidinium bromide  1 puff Inhalation q morning - 10a  . furosemide  20 mg Oral QODAY  . insulin aspart  0-15 Units Subcutaneous Q4H  . methylPREDNISolone (SOLU-MEDROL) injection  40 mg Intravenous Q12H  . mirtazapine  7.5 mg Oral QHS  . multivitamin with minerals  1 tablet Oral Daily  . pantoprazole  40 mg Oral Daily  . sertraline  50 mg Oral Daily  . sodium chloride flush  3 mL Intravenous Q12H  . sodium chloride  1 g Oral TID WC  . tamsulosin  0.4 mg Oral Daily   Continuous Infusions:    LOS: 4 days     Darlin Priestly, MD [facility] If 7PM-7AM, please contact night-coverage 09/12/2020, 4:23 PM

## 2020-09-12 NOTE — TOC Progression Note (Signed)
Transition of Care [facility]) - Progression Note    Patient Details  Name: Martin Bean MRN: [id number] Date of Birth: [birth year]  [facility]) CM/SW Contact  Allayne Butcher, RN Phone Number: 09/12/2020, 4:57 PM  Clinical Narrative:    [facility] has agreed for patient to return at discharge.  Plan for DC tomorrow.   Expected Discharge Plan: Assisted Living [facility]) Barriers to Discharge: Continued Medical Work up  Expected Discharge Plan and Services Expected Discharge Plan: Assisted Living [facility])       Living arrangements for the past 2 months: Assisted Living Facility                                       Social Determinants of Health (SDOH) Interventions    Readmission Risk Interventions Readmission Risk Prevention Plan 01/19/2019  Transportation Screening Complete  PCP or Specialist Appt within 5-7 Days Complete  Home Care Screening Complete  Medication Review (RN CM) Complete  Some recent data might be hidden

## 2020-09-12 NOTE — Evaluation (Signed)
Physical Therapy Evaluation Patient Details Name: Martin Bean MRN: [id number] DOB: 1929/09/19 Today's Date: 09/12/2020   History of Present Illness  Presented to ER secondary to SOB; admitted for management of acute hypoxic respiratory failure due to COVID-19 PNA, new-onset afib  Clinical Impression  Patient resting in bed upon arrival to room.  Alert and oriented to basic information; follows commands and agreeable to participation with session as able.  Bilat UE/LE strength and ROM grossly symmetrical and WFL; no focal weakness appreciated.  Able to complete bed mobility with min assist; sit/stand with RW, min/mod assist; static standing balance and short-distance gait (5') with RW, min assist.  Demonstrates very short, shuffling steps; limited activity tolerance, limited dynamic balance.  Sats 90-91% on RA with short-distance gait; will continue to monitor in subsequent sessions.  Patient declined additional distance/activity due to fatigue. Would benefit from skilled PT to address above deficits and promote optimal return to PLOF.; recommend transition to STR upon discharge from acute hospitalization, as patient unsafe/unable to demonstrate ability to safely negotiate household distances/activities at this time.     Follow Up Recommendations SNF    Equipment Recommendations  Rolling walker with 5" wheels    Recommendations for Other Services       Precautions / Restrictions Precautions Precautions: Fall Restrictions Weight Bearing Restrictions: No      Mobility  Bed Mobility Overal bed mobility: Needs Assistance Bed Mobility: Supine to Sit     Supine to sit: Min assist     General bed mobility comments: assist for truncal elevation    Transfers Overall transfer level: Needs assistance Equipment used: Rolling walker (2 wheeled) Transfers: Sit to/from Stand Sit to Stand: Min assist;Mod assist         General transfer comment: assist for lift off, anterior weight  translation and balacne in A/P plane; requires UE support to maintain standing balance at all times  Ambulation/Gait Ambulation/Gait assistance: Min assist Gait Distance (Feet): 5 Feet Assistive device: Rolling walker (2 wheeled)       General Gait Details: very short, shuffling steps; limited activity tolerance, limited dynamic balance.  Sats 90-91% on RA with short-distance gait; will continue to monitor in subsequent sessions.  Patient declined additional distance/activity due to fatigue.  Stairs            Wheelchair Mobility    Modified Rankin (Stroke Patients Only)       Balance Overall balance assessment: Needs assistance Sitting-balance support: No upper extremity supported;Feet supported Sitting balance-Leahy Scale: Good     Standing balance support: Bilateral upper extremity supported Standing balance-Leahy Scale: Fair                               Pertinent Vitals/Pain Pain Assessment: No/denies pain    Home Living Family/patient expects to be discharged to:: Assisted living               Home Equipment: Gilmer Mor - single point;Walker - 2 wheels;Shower seat Additional Comments: Resident of [facility]    Prior Function Level of Independence: Independent with assistive device(s)         Comments: Mod indep for ADLs, household mobilization with RW; ambulates to/from dining hall for meals.  Denies fall history; no home O2.     Hand Dominance        Extremity/Trunk Assessment   Upper Extremity Assessment Upper Extremity Assessment: Generalized weakness    Lower Extremity Assessment Lower  Extremity Assessment: Generalized weakness (grossly 4-/5 throughout)       Communication   Communication: HOH  Cognition Arousal/Alertness: Awake/alert Behavior During Therapy: WFL for tasks assessed/performed Overall Cognitive Status: Within Functional Limits for tasks assessed                                         General Comments      Exercises Other Exercises Other Exercises: Sit/stand x3 with RW, min/mod assist for lift off, balance.  Static stance with RW, min assist; however, very limited ability to move outside of immediate BOS Other Exercises: Dep assist for hygiene, peri-care after incontinent BM (patient unaware)   Assessment/Plan    PT Assessment Patient needs continued PT services  PT Problem List Decreased strength;Decreased activity tolerance;Decreased balance;Decreased mobility;Decreased cognition;Decreased knowledge of use of DME;Decreased safety awareness;Decreased knowledge of precautions;Cardiopulmonary status limiting activity       PT Treatment Interventions DME instruction;Gait training;Functional mobility training;Therapeutic activities;Patient/family education;Balance training;Therapeutic exercise    PT Goals (Current goals can be found in the Care Plan section)  Acute Rehab PT Goals Patient Stated Goal: to get my strength back PT Goal Formulation: With patient Time For Goal Achievement: 09/26/20 Potential to Achieve Goals: Good    Frequency Min 2X/week   Barriers to discharge Decreased caregiver support      Co-evaluation               AM-PAC PT "6 Clicks" Mobility  Outcome Measure Help needed turning from your back to your side while in a flat bed without using bedrails?: None Help needed moving from lying on your back to sitting on the side of a flat bed without using bedrails?: A Little Help needed moving to and from a bed to a chair (including a wheelchair)?: A Little Help needed standing up from a chair using your arms (e.g., wheelchair or bedside chair)?: A Little Help needed to walk in hospital room?: A Little Help needed climbing 3-5 steps with a railing? : A Lot 6 Click Score: 18    End of Session   Activity Tolerance: Patient tolerated treatment well;Patient limited by fatigue Patient left: in chair;with call bell/phone within reach;with  chair alarm set Nurse Communication: Mobility status PT Visit Diagnosis: Muscle weakness (generalized) (M62.81);Difficulty in walking, not elsewhere classified (R26.2)    Time: 0932-3557 PT Time Calculation (min) (ACUTE ONLY): 32 min   Charges:   PT Evaluation $PT Eval Moderate Complexity: 1 Mod PT Treatments $Therapeutic Activity: 8-22 mins       Bodey Frizell H. Manson Passey, PT, DPT, NCS 09/12/20, 4:15 PM [phone]

## 2020-09-13 LAB — BASIC METABOLIC PANEL
Anion gap: 8 (ref 5–15)
BUN: 37 mg/dL — ABNORMAL HIGH (ref 8–23)
CO2: 29 mmol/L (ref 22–32)
Calcium: 8.1 mg/dL — ABNORMAL LOW (ref 8.9–10.3)
Chloride: 99 mmol/L (ref 98–111)
Creatinine, Ser: 0.71 mg/dL (ref 0.61–1.24)
GFR, Estimated: >60 mL/min (ref >60)
Glucose, Bld: 161 mg/dL — ABNORMAL HIGH (ref 70–99)
Potassium: 3.6 mmol/L (ref 3.5–5.1)
Sodium: 136 mmol/L (ref 135–145)

## 2020-09-13 LAB — CBC
HCT: 33.2 % — ABNORMAL LOW (ref 39.0–52.0)
Hemoglobin: 11 g/dL — ABNORMAL LOW (ref 13.0–17.0)
MCH: 29.4 pg (ref 26.0–34.0)
MCHC: 33.1 g/dL (ref 30.0–36.0)
MCV: 88.8 fL (ref 80.0–100.0)
Platelets: 179 10*3/uL (ref 150–400)
RBC: 3.74 MIL/uL — ABNORMAL LOW (ref 4.22–5.81)
RDW: 14.9 % (ref 11.5–15.5)
WBC: 9 10*3/uL (ref 4.0–10.5)
nRBC: 0 % (ref 0.0–0.2)

## 2020-09-13 LAB — GLUCOSE, CAPILLARY
Glucose-Capillary: 134 mg/dL — ABNORMAL HIGH (ref 70–99)
Glucose-Capillary: 138 mg/dL — ABNORMAL HIGH (ref 70–99)
Glucose-Capillary: 147 mg/dL — ABNORMAL HIGH (ref 70–99)
Glucose-Capillary: 95 mg/dL (ref 70–99)

## 2020-09-13 LAB — CULTURE, BLOOD (ROUTINE X 2)
Culture: NO GROWTH
Culture: NO GROWTH
Special Requests: ADEQUATE

## 2020-09-13 LAB — C-REACTIVE PROTEIN: CRP: 0.9 mg/dL (ref <1.0)

## 2020-09-13 LAB — MAGNESIUM: Magnesium: 2.1 mg/dL (ref 1.7–2.4)

## 2020-09-13 MED ORDER — DEXAMETHASONE 6 MG PO TABS: 6.0000 mg | ORAL_TABLET | Freq: Every day | ORAL | 0 refills | Status: AC

## 2020-09-13 MED ORDER — DEXAMETHASONE 6 MG PO TABS: 6.0000 mg | ORAL_TABLET | Freq: Every day | ORAL | 0 refills | Status: DC

## 2020-09-13 NOTE — TOC Transition Note (Signed)
Transition of Care [facility]) - CM/SW Discharge Note   Patient Details  Name: Martin Bean MRN: [id number] Date of Birth: 1930-01-23  Transition of [facility]) CM/SW Contact:  Allayne Butcher, RN Phone Number: 09/13/2020, 12:23 PM   Clinical Narrative:    Patient qualifies for home O2.  Oxygen ordered through [employer].  Oxygen will be delivered to the room and then full set up will be delivered to [facility].    Final next level of care: Assisted Living (with home health PT and OT) Barriers to Discharge: Barriers Resolved   Patient Goals and CMS Choice Patient states their goals for this hospitalization and ongoing recovery are:: Patient's son/HCPOA, [name], stated "get out of hospital and get better" CMS Medicare.gov Compare Post Acute Care list provided to:: Patient Represenative (must comment) (daughter [name]) Choice offered to / list presented to : Adult Children  Discharge Placement              Patient chooses bed at: Other - please specify in the comment section below: [facility]) Patient to be transferred to facility by: Daughter [name] Name of family member notified: [name] Patient and family notified of of transfer: 09/13/20  Discharge Plan and Services                DME Arranged: Oxygen DME Agency: [employer] Date DME Agency Contacted: 09/13/20 Time DME Agency Contacted: 1222   HH Arranged: PT,OT HH Agency: [facility] Date HH Agency Contacted: 09/13/20 Time HH Agency Contacted: [phone] Representative spoke with at [facility] Agency: [place]  Social Determinants of Health (SDOH) Interventions     Readmission Risk Interventions Readmission Risk Prevention Plan 01/19/2019  Transportation Screening Complete  PCP or Specialist Appt within 5-7 Days Complete  Home Care Screening Complete  Medication Review (RN CM) Complete  Some recent data might be hidden

## 2020-09-13 NOTE — Discharge Summary (Addendum)
Physician Discharge Summary   Martin HaverJasper L Bean  male DOB: 04/11/1930  UJW:[id number]  PCP: [name], [name], MD  Admit date: 09/08/2020 Discharge date: 09/13/2020  Admitted From: assisted living Disposition:  assisted living Daughter updated on discharge plans prior to discharge.  Home Health: Yes  PT/OT CODE STATUS: Full code    Hospital Course:  For full details, please see H&P, progress notes, consult notes and ancillary notes.  Briefly,  Martin HarperJasper L Kimreyis a 87 y.o.malewith medical history significant of CVA, hypertension, chronic diastolic CHF, and depression,COPD, HLD who presented from assisted living facility due to increase sob in setting of recent covid diagnosis 1/17.   Acute hypoxic respiratory failure 2/2 COVID PNA.  CTPAnegative for PE.  procal neg.  Pt initially needed 2L O2, however, prior to discharge, pt was sating 90's on room air at rest, but when up and walking, needed 3L supplemental oxygen.  Pt is discharged with 3L oxygen.  COVID PNA Pt finished a full course of Remdesivir.  CRP 3.9 on presentation, trending up to 7.8 next morning, so pt was started on solumedrol 40 mg q12h.  CRP started to trend down.  Pt is discharged with 5 more days of oral decadron 6 mg daily.  Afib, POA No known prior hx.  Remained rate controlled.    Hx of COPD Not in exacerbation.  Continued home Trelegy as Virgel BouquetBreo and Incruse while inpatient.  Trop elevation 2/2 demand ischemia trop peaked at 108.  No chest pain.  Diastolic CHF w/o acute exacerbation.  CTPA w/o pleural effusion or pulmonary edema.  patient was given lasix in ed x 1 and then continued on home lasix 20 mg every other day.  Hx of CVA continued home ASA 81 and statin  hypertension BP 120's-150's.  Continued home amlodipine and lasix.  depression Continued home Zoloft and Remeron  HLD  continued statin   GERD Continued home PPI  BPH Continued home Flomax  Anemia Hgb 9-10,  baseline.  anemia workup showed no deficiency in iron, folate and Vit B12.  Weakness PT eval rec SNF.  [facility]   Discharge Diagnoses:  Active Problems:   Pneumonia due to COVID-19 virus    Discharge Instructions:  Allergies as of 09/13/2020   No Known Allergies     Medication List    STOP taking these medications   acidophilus Caps capsule   multivitamin with minerals Tabs tablet   oxyCODONE 5 MG immediate release tablet Commonly known as: Oxy IR/ROXICODONE     TAKE these medications   acetaminophen 325 MG tablet Commonly known as: TYLENOL Take 2 tablets (650 mg total) by mouth every 4 (four) hours as needed for headache or mild pain.   albuterol 108 (90 Base) MCG/ACT inhaler Commonly known as: VENTOLIN HFA Inhale 2 puffs into the lungs 2 (two) times daily as needed for wheezing or shortness of breath.   amLODipine 2.5 MG tablet Commonly known as: NORVASC Take 2.5 mg by mouth daily. What changed: Another medication with the same name was removed. Continue taking this medication, and follow the directions you see here.   aspirin EC 81 MG tablet Take 81 mg by mouth daily.   atorvastatin 40 MG tablet Commonly known as: LIPITOR Take 1 tablet (40 mg total) by mouth daily at 6 PM.   dexamethasone 6 MG tablet Commonly known as: DECADRON Take 1 tablet (6 mg total) by mouth daily for 5  days.   diclofenac Sodium 1 % Gel Commonly known as: VOLTAREN 2 grams four times a day as needed for pain left leg and back   feeding supplement Liqd Take 237 mLs by mouth 2 (two) times daily between meals.   furosemide 20 MG tablet Commonly known as: LASIX Take 1 tablet (20 mg total) by mouth every other day.   guaiFENesin 600 MG 12 hr tablet Commonly known as: MUCINEX Take 600 mg by mouth every 12 (twelve) hours as needed for cough (for cough or congestion).   loperamide 2 MG capsule Commonly known  as: IMODIUM Take 1 capsule (2 mg total) by mouth every 6 (six) hours as needed for diarrhea or loose stools.   mirtazapine 7.5 MG tablet Commonly known as: REMERON Take 1 tablet (7.5 mg total) by mouth at bedtime.   omeprazole 40 MG capsule Commonly known as: PRILOSEC Take 40 mg by mouth daily.   sertraline 50 MG tablet Commonly known as: ZOLOFT Take 50 mg by mouth daily.   sodium chloride 1 g tablet Take 1 tablet (1 g total) by mouth 3 (three) times daily with meals.   tamsulosin 0.4 MG Caps capsule Commonly known as: FLOMAX Take 0.4 mg by mouth daily.   Trelegy Ellipta 100-62.5-25 MCG/INH Aepb Generic drug: Fluticasone-Umeclidin-Vilant Inhale 1 puff into the lungs daily.        Follow-up Information    Gracelyn Nurse, MD. Schedule an appointment as soon as possible for a visit in 1 week(s).   Specialty: Internal Medicine Contact information: [address] [place] [address] [phone]               No Known Allergies   The results of significant diagnostics from this hospitalization (including imaging, microbiology, ancillary and laboratory) are listed below for reference.   Consultations:   Procedures/Studies: DG Chest 1 View  Result Date: 09/08/2020 CLINICAL DATA:  Short of breath, COVID EXAM: CHEST  1 VIEW COMPARISON:  07/18/2020, CT chest 07/20/2020 FINDINGS: Small pleural effusions. Patchy basilar airspace opacities. Stable cardiomediastinal silhouette with aortic atherosclerosis. IMPRESSION: Small pleural effusions with patchy basilar airspace opacities, suspect for pneumonia. Electronically Signed   By: Jasmine Pang M.D.   On: 09/08/2020 16:52   CT Angio Chest PE W and/or Wo Contrast  Result Date: 09/08/2020 CLINICAL DATA:  History of COVID-19 positivity with shortness of breath and hypoxia EXAM: CT ANGIOGRAPHY CHEST WITH CONTRAST TECHNIQUE: Multidetector CT imaging of the chest was performed using the standard protocol during bolus  administration of intravenous contrast. Multiplanar CT image reconstructions and MIPs were obtained to evaluate the vascular anatomy. CONTRAST:  26mL OMNIPAQUE IOHEXOL 350 MG/ML SOLN COMPARISON:  Plain film from earlier in the same day, CT from 07/20/2020. FINDINGS: Cardiovascular: Thoracic aorta demonstrates atherosclerotic calcifications. No aneurysmal dilatation or dissection is seen. No cardiac enlargement is seen. Coronary calcifications are noted. The pulmonary artery is well visualized bilaterally within normal branching pattern. No filling defect to suggest pulmonary embolism is noted. Mediastinum/Nodes: Thoracic inlet is within normal limits. No sizable hilar or mediastinal adenopathy is noted. The esophagus is distended with air. Lungs/Pleura: Emphysematous changes are noted bilaterally. Patchy airspace opacity is seen predominately within the lung bases consistent with the given clinical history of COVID-19 positivity. No pneumothorax is seen. No sizable parenchymal nodule is noted. Bibasilar scarring is noted stable from a prior CT. Upper Abdomen: Visualized upper abdomen is within normal limits. Musculoskeletal: Degenerative changes of the thoracic spine are noted. Old rib fractures with  healing are seen on the right. Review of the MIP images confirms the above findings. IMPRESSION: No evidence of pulmonary emboli. Patchy airspace opacities in the lungs consistent with the given clinical history of COVID-19 positivity. Stable bibasilar scarring unchanged from December of 2021. Aortic Atherosclerosis (ICD10-I70.0) and Emphysema (ICD10-J43.9). Electronically Signed   By: Alcide Clever M.D.   On: 09/08/2020 20:02      Labs: BNP (last 3 results) Recent Labs    07/17/20 1717 09/08/20 1607  BNP 202.0* 327.5*   Basic Metabolic Panel: Recent Labs  Lab 09/08/20 1607 09/08/20 1844 09/08/20 1844 09/09/20 0035 09/10/20 0545 09/11/20 0600 09/12/20 0654 09/13/20 0635  NA  --  131*  --   --  136  136 137 136  K  --  3.5  --   --  3.8 3.6 3.6 3.6  CL  --  97*  --   --  103 101 99 99  CO2  --  22  --   --  27 27 29 29   GLUCOSE  --  176*  --   --  165* 157* 165* 161*  BUN  --  12  --   --  21 29* 31* 37*  CREATININE  --  0.82   < > 0.84 0.73 0.71 0.68 0.71  CALCIUM  --  7.8*  --   --  8.3* 8.2* 8.2* 8.1*  MG 1.8  --   --   --  2.1 2.2 2.2 2.1   < > = values in this interval not displayed.   Liver Function Tests: Recent Labs  Lab 09/08/20 1844  AST 20  ALT 11  ALKPHOS 91  BILITOT 0.8  PROT 6.3*  ALBUMIN 2.9*   No results for input(s): LIPASE, AMYLASE in the last 168 hours. No results for input(s): AMMONIA in the last 168 hours. CBC: Recent Labs  Lab 09/08/20 1607 09/09/20 0035 09/10/20 0545 09/11/20 0600 09/12/20 0654 09/13/20 0635  WBC 15.0* 14.3* 14.2* 11.4* 8.1 9.0  NEUTROABS 13.3*  --   --   --   --   --   HGB 10.8* 9.9* 10.2* 10.9* 11.2* 11.0*  HCT 31.6* 29.9* 30.5* 32.2* 33.6* 33.2*  MCV 88.3 89.3 88.7 87.5 88.2 88.8  PLT 176 165 169 169 178 179   Cardiac Enzymes: No results for input(s): CKTOTAL, CKMB, CKMBINDEX, TROPONINI in the last 168 hours. BNP: Invalid input(s): POCBNP CBG: Recent Labs  Lab 09/12/20 1521 09/12/20 2125 09/13/20 0005 09/13/20 0634 09/13/20 0821  GLUCAP 287* 123* 95 147* 138*   D-Dimer No results for input(s): DDIMER in the last 72 hours. Hgb A1c No results for input(s): HGBA1C in the last 72 hours. Lipid Profile No results for input(s): CHOL, HDL, LDLCALC, TRIG, CHOLHDL, LDLDIRECT in the last 72 hours. Thyroid function studies No results for input(s): TSH, T4TOTAL, T3FREE, THYROIDAB in the last 72 hours.  Invalid input(s): FREET3 Anemia work up Recent Labs    09/12/20 0654  VITAMINB12 434  FOLATE 15.8  TIBC 203*  IRON 46   Urinalysis    Component Value Date/Time   COLORURINE YELLOW (A) 09/08/2020 1218   APPEARANCEUR CLEAR (A) 09/08/2020 1218   LABSPEC 1.032 (H) 09/08/2020 1218   PHURINE 5.0 09/08/2020 1218    GLUCOSEU NEGATIVE 09/08/2020 1218   HGBUR NEGATIVE 09/08/2020 1218   BILIRUBINUR NEGATIVE 09/08/2020 1218   KETONESUR NEGATIVE 09/08/2020 1218   PROTEINUR NEGATIVE 09/08/2020 1218   NITRITE NEGATIVE 09/08/2020 1218   LEUKOCYTESUR NEGATIVE 09/08/2020 1218  Sepsis Labs Invalid input(s): PROCALCITONIN,  WBC,  LACTICIDVEN Microbiology Recent Results (from the past 240 hour(s))  Urine culture     Status: None   Collection Time: 09/08/20 12:18 PM   Specimen: Urine, Random  Result Value Ref Range Status   Specimen Description   Final    URINE, RANDOM Performed at [facility], [address], [place], [place] [address]    Special Requests   Final    NONE Performed at [facility], [address] [place], [place] [address]    Culture   Final    NO GROWTH Performed at [facility], [address], [place], [place] [address]    Report Status 09/10/2020 FINAL  Final  Blood culture (routine x 2)     Status: None   Collection Time: 09/08/20  5:06 PM   Specimen: BLOOD  Result Value Ref Range Status   Specimen Description BLOOD BLOOD RIGHT WRIST  Final   Special Requests   Final    BOTTLES DRAWN AEROBIC AND ANAEROBIC Blood Culture adequate volume   Culture   Final    NO GROWTH 5 DAYS Performed at [facility], [address] [place], [place] [address]    Report Status 09/13/2020 FINAL  Final  SARS Coronavirus 2 by RT PCR (hospital order, performed in [facility] hospital lab) Nasopharyngeal Nasopharyngeal Swab     Status: Abnormal   Collection Time: 09/08/20  5:06 PM   Specimen: Nasopharyngeal Swab  Result Value Ref Range Status   SARS Coronavirus 2 POSITIVE (A) NEGATIVE Final    Comment: RESULT CALLED TO, READ BACK BY AND VERIFIED WITH: REED RENO AT 1912 ON 09/08/20 BY SS (NOTE) SARS-CoV-2 target nucleic acids are DETECTED  SARS-CoV-2 RNA is generally detectable in upper respiratory specimens  during the acute phase of infection.   Positive results are indicative  of the presence of the identified virus, but do not rule out bacterial infection or co-infection with other pathogens not detected by the test.  Clinical correlation with patient history and  other diagnostic information is necessary to determine patient infection status.  The expected result is negative.  Fact Sheet for Patients:   BoilerBrush.com.cy   Fact Sheet for Healthcare Providers:   https://pope.com/    This test is not yet approved or cleared by the [place] FDA and  has been authorized for detection and/or diagnosis of SARS-CoV-2 by FDA under an Emergency Use Authorization (EUA).  This EUA will remain in effect (meaning this te st can be used) for the duration of  the COVID-19 declaration under Section 564(b)(1) of the Act, 21 U.S.C. section 360-bbb-3(b)(1), unless the authorization is terminated or revoked sooner.  Performed at [facility], [address] [place], [place] [address]   Blood culture (routine x 2)     Status: None   Collection Time: 09/08/20  5:19 PM   Specimen: BLOOD  Result Value Ref Range Status   Specimen Description BLOOD BLOOD LEFT HAND  Final   Special Requests   Final    BOTTLES DRAWN AEROBIC AND ANAEROBIC Blood Culture results may not be optimal due to an inadequate volume of blood received in culture bottles   Culture   Final    NO GROWTH 5 DAYS Performed at [facility], [address] [place], [place] [address]    Report Status 09/13/2020 FINAL  Final     Total time spend on discharging this patient, including the last patient  exam, discussing the hospital stay, instructions for ongoing care as it relates to all pertinent caregivers, as well as preparing the medical discharge records, prescriptions, and/or referrals as applicable, is 30 minutes.    Darlin Priestlyina Jaleia Hanke, MD  [facility] 09/13/2020, 8:59 AM

## 2020-09-13 NOTE — TOC Transition Note (Signed)
Transition of Care [facility]) - CM/SW Discharge Note   Patient Details  Name: Martin Bean MRN: [id number] Date of Birth: 1930/03/05  [facility] CM/SW Contact:  Allayne Butcher, RN Phone Number: 09/13/2020, 9:38 AM   Clinical Narrative:    Patient is medically cleared for discharge back to [facility].  Patient's family would like to use [facility] for PT and OT at [facility] per [place] with [facility] there will be no copay for services through them.   Patient's daughter [name] will pick him up today when discharge had been completed.  DC summary and FL2 will be faxed to [facility].     Final next level of care: Assisted Living (with home health PT and OT) Barriers to Discharge: Barriers Resolved   Patient Goals and CMS Choice Patient states their goals for this hospitalization and ongoing recovery are:: Patient's son/HCPOA, [name], stated "get out of hospital and get better" CMS Medicare.gov Compare Post Acute Care list provided to:: Patient Represenative (must comment) (daughter [name]) Choice offered to / list presented to : Adult Children  Discharge Placement              Patient chooses bed at: Other - please specify in the comment section below: [facility]) Patient to be transferred to facility by: Daughter [name] Name of family member notified: [name] Patient and family notified of of transfer: 09/13/20  Discharge Plan and Services                DME Arranged: N/A DME Agency: NA       HH Arranged: PT,OT HH Agency: [facility] Date HH Agency Contacted: 09/13/20 Time HH Agency Contacted: [phone] Representative spoke with at [facility] Agency: [place]  Social Determinants of Health (SDOH) Interventions     Readmission Risk Interventions Readmission Risk Prevention Plan 01/19/2019  Transportation Screening Complete  PCP or Specialist Appt within 5-7 Days Complete  Home Care Screening Complete  Medication Review (RN CM) Complete  Some recent data  might be hidden

## 2020-09-13 NOTE — Progress Notes (Signed)
Physical Therapy Treatment Patient Details Name: Martin Bean MRN: [id number] DOB: 1930/01/19 Today's Date: 09/13/2020    History of Present Illness Presented to ER secondary to SOB; admitted for management of acute hypoxic respiratory failure due to COVID-19 PNA, new-onset afib    PT Comments    Pt was ambulating with RN tech upon arriving. He was ambulating without O2 and desaturates to 82%. Applied 3 L o2 and pt recovers to 92%. By end of session, pt able to be on 2 L o2 while maintaining > 88%. Pt was able to demonstrate safe ambulation but is very deconditioned. Slightly SOB with only ambulating 75 ft. Overall tolerated session ok and is planning to DC to independent living with HHPT to follow. He was sitting in recliner with chair alarm in place and call bell in reach. RN aware of pt's O2 needs.     Follow Up Recommendations  SNF;Other (comment) (pt wants to return to [facility] with HHPT to follow)     Equipment Recommendations  Rolling walker with 5" wheels       Precautions / Restrictions Precautions Precautions: Fall Restrictions Weight Bearing Restrictions: No    Mobility  Bed Mobility    General bed mobility comments: not tested during this session  Transfers Overall transfer level: Needs assistance Equipment used: Rolling walker (2 wheeled) Transfers: Sit to/from Stand Sit to Stand: Min guard         General transfer comment: CGA for safety  Ambulation/Gait Ambulation/Gait assistance: Min guard Gait Distance (Feet): 75 Feet Assistive device: Rolling walker (2 wheeled) Gait Pattern/deviations: Trunk flexed Gait velocity: decreased   General Gait Details: Pt was able to ambulate 75 ft total but did have desaturation to 82% without O2. needed 3L o2 to recover to 92% and then weaned to 2 L o2.      Balance Overall balance assessment: Needs assistance Sitting-balance support: No upper extremity supported;Feet supported Sitting balance-Leahy Scale:  Good     Standing balance support: Bilateral upper extremity supported Standing balance-Leahy Scale: Fair         Cognition Arousal/Alertness: Awake/alert Behavior During Therapy: WFL for tasks assessed/performed Overall Cognitive Status: No family/caregiver present to determine baseline cognitive functioning    General Comments: Pt is A and able to follow commands. did have some cognition deficits come to light during session but overall cognition did not limit session.             Pertinent Vitals/Pain Pain Assessment: No/denies pain           PT Goals (current goals can now be found in the care plan section) Acute Rehab PT Goals Patient Stated Goal: go home Progress towards PT goals: Progressing toward goals    Frequency    Min 2X/week      PT Plan Current plan remains appropriate       AM-PAC PT "6 Clicks" Mobility   Outcome Measure  Help needed turning from your back to your side while in a flat bed without using bedrails?: None Help needed moving from lying on your back to sitting on the side of a flat bed without using bedrails?: A Little Help needed moving to and from a bed to a chair (including a wheelchair)?: A Little Help needed standing up from a chair using your arms (e.g., wheelchair or bedside chair)?: A Little Help needed to walk in hospital room?: A Little Help needed climbing 3-5 steps with a railing? : A Lot 6 Click Score: 18  End of Session Equipment Utilized During Treatment: Gait belt;Oxygen (needs O2) Activity Tolerance: Patient tolerated treatment well;Patient limited by fatigue Patient left: in chair;with call bell/phone within reach;with chair alarm set Nurse Communication: Mobility status PT Visit Diagnosis: Muscle weakness (generalized) (M62.81);Difficulty in walking, not elsewhere classified (R26.2)     Time: 5750-5183 PT Time Calculation (min) (ACUTE ONLY): 10 min  Charges:  $Gait Training: 8-22 mins                      Jetta Lout PTA 09/13/20, 12:31 PM

## 2020-09-13 NOTE — Care Management Important Message (Signed)
Important Message  Patient Details  Name: Martin Bean MRN: [id number] Date of Birth: Apr 28, 1930   Medicare Important Message Given:  Yes     Allayne Butcher, RN 09/13/2020, 10:53 AM

## 2020-09-13 NOTE — [place] FL2 (Signed)
[place] MEDICAID FL2 LEVEL OF CARE SCREENING TOOL     IDENTIFICATION  Patient Name: Martin Bean Birthdate: 1929/11/13 Sex: male Admission Date (Current Location): 09/08/2020  [place] and [place] Number:  Chiropodist and Address:  [facility], [address] [place], [place] [address]      Provider Number: [id number]  Attending Physician Name and Address:  Darlin Priestly, MD  Relative Name and Phone Number:  [name] [phone]    Current Level of Care: Hospital Recommended Level of Care: Assisted Living Facility Prior Approval Number:    Date Approved/Denied:   PASRR Number:    Discharge Plan: Other (Comment) (Assisted Living)    Current Diagnoses: Patient Active Problem List   Diagnosis Date Noted  . Pneumonia due to COVID-19 virus 09/08/2020  . Protein-calorie malnutrition, severe 07/28/2020  . Leg pain   . COPD with acute exacerbation (HCC)   . Acute respiratory failure with hypoxia (HCC)   . Weakness   . Acute on chronic diastolic CHF (congestive heart failure) (HCC) 07/17/2020  . Hyponatremia 07/17/2020  . Hypertensive urgency 07/17/2020  . Bilateral pneumonia 01/17/2019  . CAP (community acquired pneumonia) 12/28/2018  . Acute renal failure (ARF) (HCC) 12/28/2018  . Multifocal pneumonia 12/28/2018  . Hemothorax   . Malnutrition of moderate degree 11/02/2017  . Hydropneumothorax 11/01/2017  . Essential hypertension 10/09/2017  . Hyperlipidemia 10/09/2017  . Carotid arterial disease (HCC) 10/02/2017  . Carotid artery stenosis, unilateral, left 09/04/2017  . CVA (cerebral vascular accident) (HCC) 08/14/2017  . Benign prostatic hyperplasia with urinary frequency 06/03/2017  . Depression, prolonged 06/03/2017  . 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 Psoriasis, unspecified 06/03/2017    Orientation RESPIRATION BLADDER Height & Weight     Self,Situation,Place  O2 (2L [place]) Continent,External catheter Weight: 68  kg Height:  5\' 10"  (177.8 cm)  BEHAVIORAL SYMPTOMS/MOOD NEUROLOGICAL BOWEL NUTRITION STATUS      Continent Diet (Dysphagia diet 2)  AMBULATORY STATUS COMMUNICATION OF NEEDS Skin   Limited Assist Verbally Normal                       Personal Care Assistance Level of Assistance  Bathing,Feeding,Dressing Bathing Assistance: Limited assistance Feeding assistance: Limited assistance Dressing Assistance: Limited assistance     Functional Limitations Info  Sight,Hearing Sight Info: Impaired Hearing Info: Impaired (hearing aids)      SPECIAL CARE FACTORS FREQUENCY  PT (By licensed PT),OT (By licensed OT)     PT Frequency: HH PT with OT Frequency: HH OT with [facility]            Contractures Contractures Info: Not present    Additional Factors Info  Code Status,Allergies Code Status Info: Full Allergies Info: NKA           Current Medications (09/13/2020):  This is the current hospital active medication list Current Facility-Administered Medications  Medication Dose Route Frequency Provider Last Rate Last Admin  . acetaminophen (TYLENOL) tablet 650 mg  650 mg Oral Q4H PRN 09/15/2020 A, MD      . albuterol (VENTOLIN HFA) 108 (90 Base) MCG/ACT inhaler 2 puff  2 puff Inhalation BID PRN Skip Mayer A, MD      . amLODipine (NORVASC) tablet 2.5 mg  2.5 mg Oral Daily Skip Mayer, MD   2.5 mg at 09/13/20 0942  . aspirin EC tablet 81 mg  81 mg Oral Daily 09/15/20 A, MD   81 mg at 09/13/20 0941  .  atorvastatin (LIPITOR) tablet 40 mg  40 mg Oral q1800 Skip Mayer A, MD   40 mg at 09/12/20 1700  . enoxaparin (LOVENOX) injection 40 mg  40 mg Subcutaneous Q24H Skip Mayer A, MD   40 mg at 09/13/20 0940  . fluticasone furoate-vilanterol (BREO ELLIPTA) 100-25 MCG/INH 1 puff  1 puff Inhalation q morning - 10a Valrie Hart A, RPH   1 puff at 09/13/20 0943   And  . umeclidinium bromide (INCRUSE ELLIPTA) 62.5 MCG/INH 1 puff  1 puff Inhalation q  morning - 10a Valrie Hart A, RPH   1 puff at 09/13/20 0943  . furosemide (LASIX) tablet 20 mg  20 mg Oral Fredrik Rigger, MD   20 mg at 09/12/20 0825  . guaiFENesin (MUCINEX) 12 hr tablet 600 mg  600 mg Oral Q12H PRN Lurline Del, MD   600 mg at 09/09/20 2004  . insulin aspart (novoLOG) injection 0-15 Units  0-15 Units Subcutaneous Q4H Gilles Chiquito, MD   2 Units at 09/13/20 [phone]  . methylPREDNISolone sodium succinate (SOLU-MEDROL) 40 mg/mL injection 40 mg  40 mg Intravenous Q12H Skip Mayer A, MD   40 mg at 09/13/20 0943  . mirtazapine (REMERON) tablet 7.5 mg  7.5 mg Oral QHS Darlin Priestly, MD   7.5 mg at 09/12/20 2117  . multivitamin with minerals tablet 1 tablet  1 tablet Oral Daily Lurline Del, MD   1 tablet at 09/13/20 0940  . ondansetron (ZOFRAN) injection 4 mg  4 mg Intravenous Q6H PRN Skip Mayer A, MD      . oxyCODONE (Oxy IR/ROXICODONE) immediate release tablet 2.5 mg  2.5 mg Oral Q6H PRN Lurline Del, MD      . pantoprazole (PROTONIX) EC tablet 40 mg  40 mg Oral Daily Skip Mayer A, MD   40 mg at 09/13/20 0941  . sertraline (ZOLOFT) tablet 50 mg  50 mg Oral Daily Skip Mayer A, MD   50 mg at 09/13/20 0940  . sodium chloride flush (NS) 0.9 % injection 3 mL  3 mL Intravenous Q12H Skip Mayer A, MD   3 mL at 09/13/20 0942  . sodium chloride tablet 1 g  1 g Oral TID WC Skip Mayer A, MD   1 g at 09/13/20 0940  . tamsulosin (FLOMAX) capsule 0.4 mg  0.4 mg Oral Daily Darlin Priestly, MD   0.4 mg at 09/13/20 0941     Discharge Medications: Medication List    STOP taking these medications   acidophilus Caps capsule   multivitamin with minerals Tabs tablet   oxyCODONE 5 MG immediate release tablet Commonly known as: Oxy IR/ROXICODONE     TAKE these medications   acetaminophen 325 MG tablet Commonly known as: TYLENOL Take 2 tablets (650 mg total) by mouth every 4 (four) hours as needed for headache or mild pain.   albuterol  108 (90 Base) MCG/ACT inhaler Commonly known as: VENTOLIN HFA Inhale 2 puffs into the lungs 2 (two) times daily as needed for wheezing or shortness of breath.   amLODipine 2.5 MG tablet Commonly known as: NORVASC Take 2.5 mg by mouth daily. What changed: Another medication with the same name was removed. Continue taking this medication, and follow the directions you see here.   aspirin EC 81 MG tablet Take 81 mg by mouth daily.   atorvastatin 40 MG tablet Commonly known as: LIPITOR Take 1 tablet (40 mg total) by mouth daily at 6 PM.   dexamethasone 6  MG tablet Commonly known as: DECADRON Take 1 tablet (6 mg total) by mouth daily for 5 days.   diclofenac Sodium 1 % Gel Commonly known as: VOLTAREN 2 grams four times a day as needed for pain left leg and back   feeding supplement Liqd Take 237 mLs by mouth 2 (two) times daily between meals.   furosemide 20 MG tablet Commonly known as: LASIX Take 1 tablet (20 mg total) by mouth every other day.   guaiFENesin 600 MG 12 hr tablet Commonly known as: MUCINEX Take 600 mg by mouth every 12 (twelve) hours as needed for cough (for cough or congestion).   loperamide 2 MG capsule Commonly known as: IMODIUM Take 1 capsule (2 mg total) by mouth every 6 (six) hours as needed for diarrhea or loose stools.   mirtazapine 7.5 MG tablet Commonly known as: REMERON Take 1 tablet (7.5 mg total) by mouth at bedtime.   omeprazole 40 MG capsule Commonly known as: PRILOSEC Take 40 mg by mouth daily.   sertraline 50 MG tablet Commonly known as: ZOLOFT Take 50 mg by mouth daily.   sodium chloride 1 g tablet Take 1 tablet (1 g total) by mouth 3 (three) times daily with meals.   tamsulosin 0.4 MG Caps capsule Commonly known as: FLOMAX Take 0.4 mg by mouth daily.   Trelegy Ellipta 100-62.5-25 MCG/INH Aepb Generic drug: Fluticasone-Umeclidin-Vilant Inhale 1 puff into the lungs daily.         Relevant Imaging  Results:  Relevant Lab Results:   Additional Information    Allayne Butcher, RN

## 2020-09-13 NOTE — Progress Notes (Addendum)
SATURATION QUALIFICATIONS: (This note is used to comply with regulatory documentation for home oxygen)  Patient Saturations on Room Air at Rest = 88%  Patient Saturations on Room Air while Ambulating = 82%  Patient Saturations on 3 Liters of oxygen while Ambulating = 90%  Please briefly explain why patient needs home oxygen:

## 2020-09-13 NOTE — Progress Notes (Signed)
IVs removed before discharge. Went over discharge instructions with daughter on the phone. All questions answered. Patient going back to [facility] POV with daughter.

## 2020-11-17 ENCOUNTER — Inpatient Hospital Stay
Admission: EM | Admit: 2020-11-17 | Discharge: 2020-12-18 | DRG: 291 | Disposition: E | Payer: Medicare HMO | Attending: Internal Medicine | Admitting: Internal Medicine

## 2020-11-17 ENCOUNTER — Other Ambulatory Visit: Payer: Self-pay

## 2020-11-17 ENCOUNTER — Emergency Department: Payer: Medicare HMO

## 2020-11-17 DIAGNOSIS — Z681 Body mass index (BMI) 19 or less, adult: Secondary | ICD-10-CM | POA: Diagnosis not present

## 2020-11-17 DIAGNOSIS — J449 Chronic obstructive pulmonary disease, unspecified: Secondary | ICD-10-CM | POA: Diagnosis present

## 2020-11-17 DIAGNOSIS — I5033 Acute on chronic diastolic (congestive) heart failure: Secondary | ICD-10-CM | POA: Diagnosis not present

## 2020-11-17 DIAGNOSIS — R0989 Other specified symptoms and signs involving the circulatory and respiratory systems: Secondary | ICD-10-CM

## 2020-11-17 DIAGNOSIS — D6489 Other specified anemias: Secondary | ICD-10-CM | POA: Diagnosis present

## 2020-11-17 DIAGNOSIS — F32A Depression, unspecified: Secondary | ICD-10-CM | POA: Diagnosis present

## 2020-11-17 DIAGNOSIS — R54 Age-related physical debility: Secondary | ICD-10-CM | POA: Diagnosis present

## 2020-11-17 DIAGNOSIS — D72828 Other elevated white blood cell count: Secondary | ICD-10-CM | POA: Diagnosis present

## 2020-11-17 DIAGNOSIS — E785 Hyperlipidemia, unspecified: Secondary | ICD-10-CM | POA: Diagnosis present

## 2020-11-17 DIAGNOSIS — I509 Heart failure, unspecified: Secondary | ICD-10-CM

## 2020-11-17 DIAGNOSIS — Z9181 History of falling: Secondary | ICD-10-CM | POA: Diagnosis not present

## 2020-11-17 DIAGNOSIS — I110 Hypertensive heart disease with heart failure: Principal | ICD-10-CM | POA: Diagnosis present

## 2020-11-17 DIAGNOSIS — Z87891 Personal history of nicotine dependence: Secondary | ICD-10-CM | POA: Diagnosis not present

## 2020-11-17 DIAGNOSIS — Z79899 Other long term (current) drug therapy: Secondary | ICD-10-CM

## 2020-11-17 DIAGNOSIS — Z8673 Personal history of transient ischemic attack (TIA), and cerebral infarction without residual deficits: Secondary | ICD-10-CM | POA: Diagnosis not present

## 2020-11-17 DIAGNOSIS — J440 Chronic obstructive pulmonary disease with acute lower respiratory infection: Secondary | ICD-10-CM | POA: Diagnosis present

## 2020-11-17 DIAGNOSIS — I480 Paroxysmal atrial fibrillation: Secondary | ICD-10-CM | POA: Diagnosis present

## 2020-11-17 DIAGNOSIS — Z7982 Long term (current) use of aspirin: Secondary | ICD-10-CM

## 2020-11-17 DIAGNOSIS — Z8616 Personal history of COVID-19: Secondary | ICD-10-CM | POA: Diagnosis not present

## 2020-11-17 DIAGNOSIS — E876 Hypokalemia: Secondary | ICD-10-CM | POA: Diagnosis present

## 2020-11-17 DIAGNOSIS — R0602 Shortness of breath: Secondary | ICD-10-CM | POA: Diagnosis present

## 2020-11-17 DIAGNOSIS — J189 Pneumonia, unspecified organism: Secondary | ICD-10-CM | POA: Diagnosis present

## 2020-11-17 DIAGNOSIS — E871 Hypo-osmolality and hyponatremia: Secondary | ICD-10-CM | POA: Diagnosis present

## 2020-11-17 DIAGNOSIS — R531 Weakness: Secondary | ICD-10-CM | POA: Diagnosis not present

## 2020-11-17 DIAGNOSIS — J181 Lobar pneumonia, unspecified organism: Secondary | ICD-10-CM | POA: Diagnosis not present

## 2020-11-17 DIAGNOSIS — Z66 Do not resuscitate: Secondary | ICD-10-CM | POA: Diagnosis present

## 2020-11-17 DIAGNOSIS — E43 Unspecified severe protein-calorie malnutrition: Secondary | ICD-10-CM | POA: Diagnosis present

## 2020-11-17 DIAGNOSIS — J439 Emphysema, unspecified: Secondary | ICD-10-CM | POA: Diagnosis not present

## 2020-11-17 DIAGNOSIS — N400 Enlarged prostate without lower urinary tract symptoms: Secondary | ICD-10-CM

## 2020-11-17 HISTORY — DX: Orthostatic hypotension: I95.1

## 2020-11-17 LAB — BASIC METABOLIC PANEL
Anion gap: 9 (ref 5–15)
BUN: 15 mg/dL (ref 8–23)
CO2: 26 mmol/L (ref 22–32)
Calcium: 8.9 mg/dL (ref 8.9–10.3)
Chloride: 97 mmol/L — ABNORMAL LOW (ref 98–111)
Creatinine, Ser: 0.82 mg/dL (ref 0.61–1.24)
GFR, Estimated: >60 mL/min (ref >60)
Glucose, Bld: 114 mg/dL — ABNORMAL HIGH (ref 70–99)
Potassium: 5.2 mmol/L — ABNORMAL HIGH (ref 3.5–5.1)
Sodium: 132 mmol/L — ABNORMAL LOW (ref 135–145)

## 2020-11-17 LAB — CBC
HCT: 32.8 % — ABNORMAL LOW (ref 39.0–52.0)
Hemoglobin: 11 g/dL — ABNORMAL LOW (ref 13.0–17.0)
MCH: 28.2 pg (ref 26.0–34.0)
MCHC: 33.5 g/dL (ref 30.0–36.0)
MCV: 84.1 fL (ref 80.0–100.0)
Platelets: 175 10*3/uL (ref 150–400)
RBC: 3.9 MIL/uL — ABNORMAL LOW (ref 4.22–5.81)
RDW: 16.2 % — ABNORMAL HIGH (ref 11.5–15.5)
WBC: 9.9 10*3/uL (ref 4.0–10.5)
nRBC: 0 % (ref 0.0–0.2)

## 2020-11-17 LAB — MAGNESIUM: Magnesium: 1.9 mg/dL (ref 1.7–2.4)

## 2020-11-17 LAB — OSMOLALITY: Osmolality: 279 mOsm/kg (ref 275–295)

## 2020-11-17 LAB — TROPONIN I (HIGH SENSITIVITY): Troponin I (High Sensitivity): 7 ng/L (ref <18)

## 2020-11-17 LAB — BRAIN NATRIURETIC PEPTIDE: B Natriuretic Peptide: 201.9 pg/mL — ABNORMAL HIGH (ref 0.0–100.0)

## 2020-11-17 MED ORDER — ASPIRIN EC 81 MG PO TBEC
81.0000 mg | DELAYED_RELEASE_TABLET | Freq: Every day | ORAL | Status: DC
  Administered 2020-11-18 – 2020-11-21 (×4): 81 mg via ORAL
  Filled 2020-11-17 (×4): qty 1

## 2020-11-17 MED ORDER — ACETAMINOPHEN 325 MG PO TABS: 650.0000 mg | ORAL_TABLET | Freq: Four times a day (QID) | ORAL | Status: DC | PRN

## 2020-11-17 MED ORDER — PANTOPRAZOLE SODIUM 40 MG PO TBEC
40.0000 mg | DELAYED_RELEASE_TABLET | Freq: Every day | ORAL | Status: DC
  Administered 2020-11-18 – 2020-11-21 (×3): 40 mg via ORAL
  Filled 2020-11-17 (×4): qty 1

## 2020-11-17 MED ORDER — GUAIFENESIN ER 600 MG PO TB12
600.0000 mg | ORAL_TABLET | Freq: Every day | ORAL | Status: DC
  Administered 2020-11-18 – 2020-11-20 (×3): 600 mg via ORAL
  Filled 2020-11-17 (×3): qty 1

## 2020-11-17 MED ORDER — TAMSULOSIN HCL 0.4 MG PO CAPS
0.4000 mg | ORAL_CAPSULE | Freq: Every day | ORAL | Status: DC
  Administered 2020-11-17 – 2020-11-20 (×4): 0.4 mg via ORAL
  Filled 2020-11-17 (×4): qty 1

## 2020-11-17 MED ORDER — ALBUTEROL SULFATE HFA 108 (90 BASE) MCG/ACT IN AERS
1.0000 | INHALATION_SPRAY | Freq: Four times a day (QID) | RESPIRATORY_TRACT | Status: DC | PRN
  Administered 2020-11-18: 2 via RESPIRATORY_TRACT
  Filled 2020-11-17 (×2): qty 6.7

## 2020-11-17 MED ORDER — IPRATROPIUM-ALBUTEROL 0.5-2.5 (3) MG/3ML IN SOLN
3.0000 mL | Freq: Four times a day (QID) | RESPIRATORY_TRACT | Status: DC
  Administered 2020-11-17: 3 mL via RESPIRATORY_TRACT
  Filled 2020-11-17: qty 3

## 2020-11-17 MED ORDER — ACETAMINOPHEN 650 MG RE SUPP: 650.0000 mg | Freq: Four times a day (QID) | RECTAL | Status: DC | PRN

## 2020-11-17 MED ORDER — FUROSEMIDE 10 MG/ML IJ SOLN
20.0000 mg | Freq: Two times a day (BID) | INTRAMUSCULAR | Status: DC
  Administered 2020-11-17 – 2020-11-20 (×6): 20 mg via INTRAVENOUS
  Filled 2020-11-17 (×6): qty 2

## 2020-11-17 MED ORDER — FUROSEMIDE 10 MG/ML IJ SOLN
40.0000 mg | Freq: Once | INTRAMUSCULAR | Status: AC
  Administered 2020-11-17: 40 mg via INTRAVENOUS
  Filled 2020-11-17: qty 4

## 2020-11-17 MED ORDER — MIRTAZAPINE 15 MG PO TABS
7.5000 mg | ORAL_TABLET | Freq: Every day | ORAL | Status: DC
  Administered 2020-11-17 – 2020-11-20 (×4): 7.5 mg via ORAL
  Filled 2020-11-17 (×4): qty 1

## 2020-11-17 MED ORDER — SERTRALINE HCL 50 MG PO TABS
25.0000 mg | ORAL_TABLET | Freq: Every day | ORAL | Status: DC
  Administered 2020-11-18 – 2020-11-21 (×4): 25 mg via ORAL
  Filled 2020-11-17 (×4): qty 1

## 2020-11-17 MED ORDER — MIDODRINE HCL 5 MG PO TABS
5.0000 mg | ORAL_TABLET | Freq: Three times a day (TID) | ORAL | Status: DC
  Administered 2020-11-17 – 2020-11-21 (×13): 5 mg via ORAL
  Filled 2020-11-17 (×15): qty 1

## 2020-11-17 MED ORDER — ENSURE ENLIVE PO LIQD
237.0000 mL | Freq: Two times a day (BID) | ORAL | Status: DC
  Administered 2020-11-18 (×2): 237 mL via ORAL

## 2020-11-17 NOTE — ED Provider Notes (Signed)
[facility] Emergency Department Provider Note ____________________________________________   Event Date/Time   First MD Initiated Contact with Patient 11/09/2020 1335     (approximate)  I have reviewed the triage vital signs and the nursing notes.   HISTORY  Chief Complaint Shortness of Breath   HPI Martin Bean is a 87 y.o. male with PMH as noted below including CHF and COPD presents with increased shortness of breath, swelling, and generalized weakness.  The symptoms have been present for months and have been worsening over the last few weeks.  The patient was recently treated for pneumonia and his cough and sputum production improved but has remained shortness of breath with decreased exercise tolerance.  He states he especially becomes short of breath when he talks or tries to ambulate.  The patient was seen by his pulmonologist Dr. Karna Christmas yesterday.  He is on midodrine due to persistent orthostatic hypotension.  He was on Lasix and has been switched to torsemide although has not taken it yet.  However, it was recommended yesterday that he come to the hospital for admission for IV diuresis and an echocardiogram.  He initially declined but then decided today that he was agreeable to this.  Past Medical History:  Diagnosis Date  . Carotid arterial disease (HCC) 10/02/2017  . CHF (congestive heart failure) (HCC)   . COPD (chronic obstructive pulmonary disease) (HCC)   . CVA (cerebral vascular accident) (HCC) 08/14/2017  . Depression   . Diastolic heart failure (HCC)   . Family history of adverse reaction to anesthesia   . HOH (hard of hearing)   . HTN (hypertension)   . Hydropneumothorax 11/01/2017  . Hyperlipidemia 10/09/2017  . Malnutrition of moderate degree 11/02/2017  . Orthostatic hypotension   . Pneumonia   . Prostate enlargement   . Psoriasis     Patient Active Problem List   Diagnosis Date Noted  . Acute on chronic diastolic heart  failure (HCC) 16/05/9603  . Pneumonia due to COVID-19 virus 09/08/2020  . Protein-calorie malnutrition, severe 07/28/2020  . Leg pain   . COPD with acute exacerbation (HCC)   . Acute respiratory failure with hypoxia (HCC)   . Weakness   . Acute on chronic diastolic CHF (congestive heart failure) (HCC) 07/17/2020  . Hyponatremia 07/17/2020  . Hypertensive urgency 07/17/2020  . Bilateral pneumonia 01/17/2019  . CAP (community acquired pneumonia) 12/28/2018  . Acute renal failure (ARF) (HCC) 12/28/2018  . Multifocal pneumonia 12/28/2018  . Hemothorax   . Malnutrition of moderate degree 11/02/2017  . Hydropneumothorax 11/01/2017  . Essential hypertension 10/09/2017  . Hyperlipidemia 10/09/2017  . Carotid arterial disease (HCC) 10/02/2017  . Carotid artery stenosis, unilateral, left 09/04/2017  . CVA (cerebral vascular accident) (HCC) 08/14/2017  . Benign prostatic hyperplasia with urinary frequency 06/03/2017  . Depression, prolonged 06/03/2017  . 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 Psoriasis, unspecified 06/03/2017    Past Surgical History:  Procedure Laterality Date  . CATARACT EXTRACTION W/PHACO Right 02/25/2018   Procedure: CATARACT EXTRACTION PHACO AND INTRAOCULAR LENS PLACEMENT (IOC);  Surgeon: Galen Manila, MD;  Location: [facility] ORS;  Service: Ophthalmology;  Laterality: Right;  [place] 01:03.2 AP% 17.1 CDE 10.83 Fluid Pack lot #[id number]  . CATARACT EXTRACTION W/PHACO Left 03/18/2018   Procedure: CATARACT EXTRACTION PHACO AND INTRAOCULAR LENS PLACEMENT (IOC);  Surgeon: Galen Manila, MD;  Location: [facility] ORS;  Service: Ophthalmology;  Laterality: Left;  [place] 1.11 AP% 16.7 CDE 11.90 Fluid pack lot # [id number]  . ENDARTERECTOMY Left 10/02/2017  Procedure: ENDARTERECTOMY CAROTID;  Surgeon: Annice Needy, MD;  Location: [facility] ORS;  Service: Vascular;  Laterality: Left;  . HERNIA REPAIR      Prior to Admission medications   Medication Sig Start Date End Date Taking? Authorizing  Provider  acetaminophen (TYLENOL) 325 MG tablet Take 2 tablets (650 mg total) by mouth every 4 (four) hours as needed for headache or mild pain. 07/29/20  Yes Wieting, Richard, MD  albuterol (PROVENTIL) (2.5 MG/3ML) 0.083% nebulizer solution Take 2.5 mg by nebulization 3 (three) times daily.   Yes [provider]  albuterol (VENTOLIN HFA) 108 (90 Base) MCG/ACT inhaler Inhale 2 puffs into the lungs 2 (two) times daily. Also if needed   Yes [provider]  aspirin EC 81 MG tablet Take 81 mg by mouth daily.  08/16/17  Yes [provider]  diclofenac Sodium (VOLTAREN) 1 % GEL 2 grams four times a day as needed for pain left leg and back 07/29/20  Yes Wieting, Richard, MD  feeding supplement (ENSURE ENLIVE / ENSURE PLUS) LIQD Take 237 mLs by mouth 2 (two) times daily between meals. 07/29/20  Yes Wieting, Richard, MD  guaiFENesin (MUCINEX) 600 MG 12 hr tablet Take 600 mg by mouth daily.   Yes [provider]  loperamide (IMODIUM) 2 MG capsule Take 1 capsule (2 mg total) by mouth every 6 (six) hours as needed for diarrhea or loose stools. 07/29/20  Yes Wieting, Richard, MD  midodrine (PROAMATINE) 5 MG tablet Take 5 mg by mouth 3 (three) times daily with meals.   Yes [provider]  mirtazapine (REMERON) 7.5 MG tablet Take 1 tablet (7.5 mg total) by mouth at bedtime. 07/29/20  Yes Wieting, Richard, MD  omeprazole (PRILOSEC) 40 MG capsule Take 40 mg by mouth daily. 04/11/20  Yes [provider]  potassium chloride (KLOR-CON) 8 MEQ tablet Take 8 mEq by mouth every other day.   Yes [provider]  sertraline (ZOLOFT) 25 MG tablet Take 25 mg by mouth daily.   Yes [provider]  sodium chloride 1 g tablet Take 1 tablet (1 g total) by mouth 3 (three) times daily with meals. Patient taking differently: Take 1 g by mouth in the morning and at bedtime. 07/29/20  Yes Wieting, Richard, MD  tamsulosin (FLOMAX) 0.4 MG CAPS capsule Take 0.4 mg by  mouth daily.  06/03/17  Yes [provider]  torsemide (DEMADEX) 20 MG tablet Take 20 mg by mouth daily. 11/16/20  Yes [provider]  vitamin B-12 (CYANOCOBALAMIN) 1000 MCG tablet Take 1,000 mcg by mouth daily.   Yes [provider]  Zinc Oxide (DESITIN) 40 % PSTE Apply topically every morning.   Yes [provider]    Allergies Patient has no known allergies.  Family History  Problem Relation Age of Onset  . Stroke Mother     Social History Social History   Tobacco Use  . Smoking status: Former Smoker    Types: Cigarettes    Quit date: 09/24/2013    Years since quitting: 7.1  . Smokeless tobacco: Never Used  Vaping Use  . Vaping Use: Never used  Substance Use Topics  . Alcohol use: No  . Drug use: No    Review of Systems  Constitutional: No fever.  Positive for generalized weakness. Eyes: No redness. ENT: No sore throat. Cardiovascular: Denies chest pain. Respiratory: Positive for shortness of breath. Gastrointestinal: No vomiting or diarrhea.  Genitourinary: Negative for dysuria.  Musculoskeletal: Negative for back  pain. Skin: Negative for rash. Neurological: Negative for headache.   ____________________________________________   PHYSICAL EXAM:  VITAL SIGNS: ED Triage Vitals  Enc Vitals Group     BP 11/09/2020 1132 96/61     Pulse Rate 11/05/2020 1132 88     Resp 11/16/2020 1132 18     Temp 10/28/2020 1132 98.2 F (36.8 C)     Temp Source 10/31/2020 1132 Oral     SpO2 11/16/2020 1132 92 %     Weight 10/19/2020 1137 142 lb (64.4 kg)     Height 11/13/2020 1137 5\' 11"  (1.803 m)     Head Circumference --      Peak Flow --      Pain Score 11/15/2020 1137 0     Pain Loc --      Pain Edu? --      Excl. in GC? --     Constitutional: Alert and oriented.  Somewhat weak and frail appearing but in no acute distress. Eyes: Conjunctivae are normal.  Head: Atraumatic. Nose: No congestion/rhinnorhea. Mouth/Throat: Mucous membranes are moist.    Neck: Normal range of motion.  Cardiovascular: Normal rate, regular rhythm.  Good peripheral circulation. Respiratory: Increased respiratory effort.  No retractions.  Rales to bilateral bases. Gastrointestinal: No distention.  Musculoskeletal: 2+ bilateral lower extremity edema.  Extremities warm and well perfused.  Neurologic:  Normal speech and language. No gross focal neurologic deficits are appreciated.  Skin:  Skin is warm and dry. No rash noted. Psychiatric: Mood and affect are normal. Speech and behavior are normal.  ____________________________________________   LABS (all labs ordered are listed, but only abnormal results are displayed)  Labs Reviewed  BASIC METABOLIC PANEL - Abnormal; Notable for the following components:      Result Value   Sodium 132 (*)    Potassium 5.2 (*)    Chloride 97 (*)    Glucose, Bld 114 (*)    All other components within normal limits  CBC - Abnormal; Notable for the following components:   RBC 3.90 (*)    Hemoglobin 11.0 (*)    HCT 32.8 (*)    RDW 16.2 (*)    All other components within normal limits  BRAIN NATRIURETIC PEPTIDE - Abnormal; Notable for the following components:   B Natriuretic Peptide 201.9 (*)    All other components within normal limits  MRSA PCR SCREENING  MAGNESIUM  BASIC METABOLIC PANEL  MAGNESIUM  PHOSPHORUS  CBC  BLOOD GAS, VENOUS  URINALYSIS, ROUTINE W REFLEX MICROSCOPIC  TSH  SODIUM, URINE, RANDOM  OSMOLALITY  OSMOLALITY, URINE  TROPONIN I (HIGH SENSITIVITY)   ____________________________________________  EKG  ED ECG REPORT I, 11/19/20, the attending physician, personally viewed and interpreted this ECG.  Date: 10/21/2020 EKG Time: 1135 Rate: 104 Rhythm: Atrial fibrillation QRS Axis: normal Intervals: normal ST/T Wave abnormalities: Nonspecific abnormalities Narrative Interpretation: no evidence of acute ischemia, unchanged from  prior  ____________________________________________  RADIOLOGY  Chest x-ray interpreted by me shows bilateral lower lobe opacity and small bilateral pleural effusion  ____________________________________________   PROCEDURES  Procedure(s) performed: No  Procedures  Critical Care performed: No ____________________________________________   INITIAL IMPRESSION / ASSESSMENT AND PLAN / ED COURSE  Pertinent labs & imaging results that were available during my care of the patient were reviewed by me and considered in my medical decision making (see chart for details).  87 year old male with PMH as noted above including CHF and COPD presents with worsening shortness of breath, decreased tolerance for even  minimal activity, and worsening peripheral edema.  I reviewed the past medical records in Epic and confirmed that he has been following with Dr. Karna ChristmasAleskerov from pulmonology.  He was last seen yesterday and was recommended to come to the hospital for admission for IV diuresis as he is on maximal outpatient therapy especially given his low blood pressure requiring midodrine.  On exam, the patient is overall somewhat weak and frail appearing but in no acute distress.  He is tachypneic especially when he speaks or moves around.  O2 saturation is in the 90s on room air.  He has rales to bilateral bases.  There is significant peripheral edema.  Overall presentation is consistent with worsening acute on chronic CHF and fluid overload.  There is no evidence of acute infectious process.  Chest x-ray is generally unchanged from prior.  Currently the blood pressure is normal so I will give a dose of IV Lasix and plan for admission.  ----------------------------------------- 3:36 PM on 11/07/2020 -----------------------------------------  I consulted Dr. Arlean HoppingHowerter from the hospital service for admission.  ____________________________________________   FINAL CLINICAL IMPRESSION(S) / ED  DIAGNOSES  Final diagnoses:  Acute on chronic congestive heart failure, unspecified heart failure type (HCC)      NEW MEDICATIONS STARTED DURING THIS VISIT:  Current Discharge Medication List       Note:  This document was prepared using Dragon voice recognition software and may include unintentional dictation errors.    Dionne BucySiadecki, Lekeya Rollings, MD 11/06/2020 2106

## 2020-11-17 NOTE — Progress Notes (Signed)
Brief note regarding plan, with full H&P to follow:    87 year old male with a history of chronic diastolic heart failure, COPD, orthostatic hypotension, who is admitted with acute on chronic diastolic heart failure at presenting with progressive shortness of breath and progressive bilateral lower extremity edema.  He has been following with outpatient pulmonology, with most recent visit occurring yesterday.  At that time, his pulmonologist recommended that the patient present to the ED for further evaluation of his progressive shortness of breath, which the pulmonologist felt was more on the basis of acute on chronic heart failure as opposed to underlying COPD.  The patient preferred to not present to the ED yesterday, but ultimately reconsidered in the setting of further progression of shortness of breath in the interval, prompting his presentation to the ED today.  He has received Lasix 40 mg IV x1.  In the setting of a history of soft blood pressures in response to IV diuresis as well as a history of orthostatic hypotension for which patient is on midodrine, will proceed with additional gentle IV diuresis.  DNR/DNI per my discussions with the patient and his daughter today.      Newton Pigg, DO Hospitalist

## 2020-11-17 NOTE — H&P (Signed)
History and Physical    PLEASE NOTE THAT DRAGON DICTATION SOFTWARE WAS USED IN THE CONSTRUCTION OF THIS NOTE.   Martin Bean ZOX:[id number] DOB: 09/27/1929 DOA: 10-14-20  PCP: Mortimer Friesurl, [place], [place] Patient coming from: home   I have personally briefly reviewed patient's [facility] medical [facility]  Chief Complaint: Shortness of breath  HPI: Martin Bean is a 87 y.o. male with medical history significant for COPD, chronic diastolic heart failure, paroxysmal atrial fibrillation, BPH, orthostatic hypotension, who is admitted to [facility] on 10-14-20 with acute on chronic diastolic heart failure after presenting from home [facility] ED complaining of shortness of breath.   The following history is provided by the patient as well as my discussions with the patient's daughter, who is present at bedside, in addition to my discussions with the emergency department physician and via chart review.  The patient confirms that he was diagnosed with COVID-19 pneumonia in January 2020, with a positive PCR result on 09/08/2020.  He reports that he subsequently developed a secondary bacterial pneumonia for which she received antibiotic therapy following which is associated shortness of breath and cough improved.  However, over the last 1 to 2 weeks, the patient reports progressive shortness of breath associated with worsening of bilateral lower extremity edema.  He follows closely with Dr. Karna ChristmasAleskerov as his outpatient pulmonologist, with most recent appointment occurring yesterday, 11/16/2020, at which time Dr. Karna ChristmasAleskerov clinically felt that the patient's recent progressive shortness of breath is most consistent with an acute heart failure exacerbation as opposed to representing an exacerbation of his COPD.  [facility] that [facility] that the [facility] ED for further evaluation of suspected acute on chronic heart failure, although the patient refused at  that time.  At that time, Dr.Aleskerov Change the patient's home diuretic regimen from Lasix 20 mg p.o. daily to torsemide 20 mg p.o. daily, although the patient has not yet had the opportunity to start on his torsemide. Over the next day following this outpatient appt in pulmonary clinic, in the setting of further progression of his shortness of breath, the patient ultimately decided to present to [facility] ED for further evaluation of this shortness of breath.  Per chart review, the patient has a documented history of orthostatic hypotension for which she is chronically on midodrine.  There is also documentation of a history of soft blood pressures in response to diuresis efforts.   He reports associated orthopnea, but denies any associated PND.  Denies any associated chest pain, diaphoresis, palpitations, nausea, vomiting.  Not associate with any cough, wheezing, hemoptysis, or calf tenderness.  No recent trauma, travel, surgical procedures, no recent melena or hematochezia.  Denies any recent subjective fever, chills, rigors, or generalized myalgias.  No recent headache, neck stiffness, rhinitis, rhinorrhea, sore throat, abdominal pain, diarrhea, or rash.  He also denies any recent dysuria, gross hematuria, or change in urinary urgency/frequency.  Per chart review, echocardiogram in December 2018 showed grade 1 diastolic dysfunction, with most recent echocardiogram, which was performed in November 2021, showing LVEF 60 to 65%, no focal wall motion abnormalities, normal left ventricular cavity size, no LVH, normal diastolic parameters, and no evidence of significant valvular pathology.  The patient confirms that he is a former smoker, having completely quit smoking in 2015.  His outpatient respiratory regimen consists of scheduled albuterol nebulizer treatments as well as as needed albuterol inhaler.  No baseline supplemental oxygen requirements.    ED Course:  Vital signs in the ED were notable for the  following: Temperature max 98.2; heart rate 89-1 08; blood pressure 122/73 -124/69; respiratory rate 25-26; oxygen saturation 94 to 95% on room air.  Labs were notable for the following: BMP notable for the following: Sodium 132 relative to 136 on 09/13/2020, potassium 5.2, chloride 97, bicarbonate 26, creatinine 0.82.  BNP 202, which is relative to the range of 202 to 327 from November 2000 09 September 2020.  High sensitivity troponin I x1 found to be 7.  CBC notable for white blood cell count of 9900, hemoglobin 11.  EKG, by way of comparison to most recent prior from 09/15/2020, shows atrial fibrillation with ventricular rate 104, nonspecific T wave inversion in V1, which is unchanged relative to most recent prior EKG, and no evidence of ST changes, including no evidence of ST elevation.  Chest x-ray, by way of comparison to imaging from 09/08/2020, showed bilateral pleural effusions as well as bibasilar airspace opacity, with the latter similar to prior imaging.  While in the ED, the following were administered: Lasix 40 mg IV x1.     Review of Systems: As per HPI otherwise 10 point review of systems negative.   Past Medical History:  Diagnosis Date  . Carotid arterial disease (HCC) 10/02/2017  . CHF (congestive heart failure) (HCC)   . COPD (chronic obstructive pulmonary disease) (HCC)   . CVA (cerebral vascular accident) (HCC) 08/14/2017  . Depression   . Diastolic heart failure (HCC)   . Family history of adverse reaction to anesthesia   . HOH (hard of hearing)   . HTN (hypertension)   . Hydropneumothorax 11/01/2017  . Hyperlipidemia 10/09/2017  . Malnutrition of moderate degree 11/02/2017  . Orthostatic hypotension   . Pneumonia   . Prostate enlargement   . Psoriasis     Past Surgical History:  Procedure Laterality Date  . CATARACT EXTRACTION W/PHACO Right 02/25/2018   Procedure: CATARACT EXTRACTION PHACO AND INTRAOCULAR LENS PLACEMENT (IOC);  Surgeon: Galen Manila, MD;   Location: [facility] ORS;  Service: Ophthalmology;  Laterality: Right;  [place] 01:03.2 AP% 17.1 CDE 10.83 Fluid Pack lot #[id number]  . CATARACT EXTRACTION W/PHACO Left 03/18/2018   Procedure: CATARACT EXTRACTION PHACO AND INTRAOCULAR LENS PLACEMENT (IOC);  Surgeon: Galen Manila, MD;  Location: [facility] ORS;  Service: Ophthalmology;  Laterality: Left;  [place] 1.11 AP% 16.7 CDE 11.90 Fluid pack lot # [id number]  . ENDARTERECTOMY Left 10/02/2017   Procedure: ENDARTERECTOMY CAROTID;  Surgeon: Annice Needy, MD;  Location: [facility] ORS;  Service: Vascular;  Laterality: Left;  . HERNIA REPAIR      Social History:  reports that he quit smoking about 7 years ago. His smoking use included cigarettes. He has never used smokeless tobacco. He reports that he does not drink alcohol and does not use drugs.   No Known Allergies  Family History  Problem Relation Age of Onset  . Stroke Mother      Prior to Admission medications   Medication Sig Start Date End Date Taking? Authorizing Provider  acetaminophen (TYLENOL) 325 MG tablet Take 2 tablets (650 mg total) by mouth every 4 (four) hours as needed for headache or mild pain. 07/29/20  Yes Wieting, Richard, MD  albuterol (PROVENTIL) (2.5 MG/3ML) 0.083% nebulizer solution Take 2.5 mg by nebulization 3 (three) times daily.   Yes [provider]  albuterol (VENTOLIN HFA) 108 (90 Base) MCG/ACT inhaler Inhale 2 puffs into the lungs 2 (two) times daily. Also if needed   Yes  [provider]  aspirin EC 81 MG tablet Take 81 mg by mouth daily.  08/16/17  Yes [provider]  diclofenac Sodium (VOLTAREN) 1 % GEL 2 grams four times a day as needed for pain left leg and back 07/29/20  Yes Wieting, Richard, MD  feeding supplement (ENSURE ENLIVE / ENSURE PLUS) LIQD Take 237 mLs by mouth 2 (two) times daily between meals. 07/29/20  Yes Wieting, Richard, MD  guaiFENesin (MUCINEX) 600 MG 12 hr tablet Take 600 mg by mouth daily.   Yes [provider]   loperamide (IMODIUM) 2 MG capsule Take 1 capsule (2 mg total) by mouth every 6 (six) hours as needed for diarrhea or loose stools. 07/29/20  Yes Wieting, Richard, MD  midodrine (PROAMATINE) 5 MG tablet Take 5 mg by mouth 3 (three) times daily with meals.   Yes [provider]  mirtazapine (REMERON) 7.5 MG tablet Take 1 tablet (7.5 mg total) by mouth at bedtime. 07/29/20  Yes Wieting, Richard, MD  omeprazole (PRILOSEC) 40 MG capsule Take 40 mg by mouth daily. 04/11/20  Yes [provider]  potassium chloride (KLOR-CON) 8 MEQ tablet Take 8 mEq by mouth every other day.   Yes [provider]  sertraline (ZOLOFT) 25 MG tablet Take 25 mg by mouth daily.   Yes [provider]  sodium chloride 1 g tablet Take 1 tablet (1 g total) by mouth 3 (three) times daily with meals. Patient taking differently: Take 1 g by mouth in the morning and at bedtime. 07/29/20  Yes Wieting, Richard, MD  tamsulosin (FLOMAX) 0.4 MG CAPS capsule Take 0.4 mg by mouth daily.  06/03/17  Yes [provider]  torsemide (DEMADEX) 20 MG tablet Take 20 mg by mouth daily. 11/16/20  Yes [provider]  vitamin B-12 (CYANOCOBALAMIN) 1000 MCG tablet Take 1,000 mcg by mouth daily.   Yes [provider]  Zinc Oxide (DESITIN) 40 % PSTE Apply topically every morning.   Yes [provider]     Objective    Physical Exam: Vitals:   11-24-20 1137 November 24, 2020 1400 11-24-20 1530 November 24, 2020 1730  BP:  122/73 124/69 98/62  Pulse:  100 (!) 108 (!) 101  Resp:  (!) 26 (!) 25 (!) 36  Temp:      TempSrc:      SpO2:  95% 94% 94%  Weight: 64.4 kg     Height: 5\' 11"  (1.803 m)       General: appears to be stated age; alert, oriented; increased work of breathing noted Skin: warm, dry, no rash Head:  AT/[place] Mouth:  Oral mucosa membranes appear moist, normal dentition Neck: supple; trachea midline Heart:  RRR; did not appreciate any M/R/G Lungs: Bibasilar crackles noted;  otherwise, CTAB, did not appreciate any wheezes or rhonchi Abdomen: + BS; soft, ND, NT Vascular: 2+ pedal pulses b/l; 2+ radial pulses b/l Extremities: 1-2+ edema in the bilateral lower extremities, no muscle wasting Neuro: strength and sensation intact in upper and lower extremities b/l    Labs on Admission: I have personally reviewed following labs and imaging studies  CBC: Recent Labs  Lab 2020/11/24 1143  WBC 9.9  HGB 11.0*  HCT 32.8*  MCV 84.1  PLT 175   Basic Metabolic Panel: Recent Labs  Lab 24-Nov-2020 1143 11-24-20 1439  NA 132*  --   K 5.2*  --   CL 97*  --   CO2 26  --   GLUCOSE 114*  --   BUN  15  --   CREATININE 0.82  --   CALCIUM 8.9  --   MG  --  1.9   GFR: Estimated Creatinine Clearance: 56.7 mL/min (by C-G formula based on SCr of 0.82 mg/dL). Liver Function Tests: No results for input(s): AST, ALT, ALKPHOS, BILITOT, PROT, ALBUMIN in the last 168 hours. No results for input(s): LIPASE, AMYLASE in the last 168 hours. No results for input(s): AMMONIA in the last 168 hours. Coagulation Profile: No results for input(s): INR, PROTIME in the last 168 hours. Cardiac Enzymes: No results for input(s): CKTOTAL, CKMB, CKMBINDEX, TROPONINI in the last 168 hours. BNP (last 3 results) No results for input(s): PROBNP in the last 8760 hours. HbA1C: No results for input(s): HGBA1C in the last 72 hours. CBG: No results for input(s): GLUCAP in the last 168 hours. Lipid Profile: No results for input(s): CHOL, HDL, LDLCALC, TRIG, CHOLHDL, LDLDIRECT in the last 72 hours. Thyroid Function Tests: No results for input(s): TSH, T4TOTAL, FREET4, T3FREE, THYROIDAB in the last 72 hours. Anemia Panel: No results for input(s): VITAMINB12, FOLATE, FERRITIN, TIBC, IRON, RETICCTPCT in the last 72 hours. Urine analysis:    Component Value Date/Time   COLORURINE YELLOW (A) 09/08/2020 1218   APPEARANCEUR CLEAR (A) 09/08/2020 1218   LABSPEC 1.032 (H) 09/08/2020 1218   PHURINE 5.0  09/08/2020 1218   GLUCOSEU NEGATIVE 09/08/2020 1218   HGBUR NEGATIVE 09/08/2020 1218   BILIRUBINUR NEGATIVE 09/08/2020 1218   KETONESUR NEGATIVE 09/08/2020 1218   PROTEINUR NEGATIVE 09/08/2020 1218   NITRITE NEGATIVE 09/08/2020 1218   LEUKOCYTESUR NEGATIVE 09/08/2020 1218    Radiological Exams on Admission: DG Chest 2 View  Result Date: 12/02/20 CLINICAL DATA:  Shortness of breath EXAM: CHEST - 2 VIEW COMPARISON:  09/08/2020 FINDINGS: The heart size and mediastinal contours are within normal limits. Trace bilateral pleural effusions and subtle dependent bibasilar heterogeneous airspace opacity, similar to prior examination. The visualized skeletal structures are unremarkable. IMPRESSION: Trace bilateral pleural effusions and subtle dependent bibasilar heterogeneous airspace opacity, similar to prior examination. No new airspace opacity. Electronically Signed   By: Lauralyn Primes M.D.   On: 2020/12/02 12:32     EKG: Independently reviewed, with result as described above.    Assessment/Plan   Martin Bean is a 87 y.o. male with medical history significant for COPD, chronic diastolic heart failure, BPH, orthostatic hypotension, who is admitted to [facility] on 2020-12-02 with acute on chronic diastolic heart failure after presenting from home to [facility] ED complaining of shortness of breath.    Principal Problem:   Acute on chronic diastolic heart failure (HCC) Active Problems:   Generalized weakness   SOB (shortness of breath)   Acute hyponatremia   Prostate enlargement   COPD (chronic obstructive pulmonary disease) (HCC)    #) Acute on chronic diastolic heart failure: Diagnosis on the basis of 1 to 2 weeks of progressive shortness of breath associated with worsening edema in the bilateral lower extremities, evidence of increased work of breathing, tachypnea, hyponatremia, elevated BNP, presenting chest x-ray showing evidence of bilateral pleural effusions.   This is in the context of a known history of chronic diastolic heart failure, with most recent echocardiogram occurring in February 2021, as further detailed above.  Etiology leading to presenting acutely decompensated heart failure is currently unclear, and the patient reports good compliance with his home diuretic regimen, as detailed above.  Presentation appears less suggestive of ACS in the absence of any associated chest pain, while presenting high-sensitivity  troponin high found to be nonelevated, which is particularly reassuring given the duration of the patient's shortness of breath, while presenting EKG shows no evidence of acute ischemic changes, as further detailed above. Has been following with his outpatient pulmonologist, who reportedly feels that the patient's recent progressive shortness of breath is more consistent with acute on chronic heart failure as opposed to representing underlying acute COPD exacerbation, as further described above, and recommendations IV diuresis as well as repeat echocardiogram. Will closely monitor ensuing HR as an additional means to evaluate volume status and help guide subsequent diuresis decision-making.  Will also closely monitor ensuing blood pressure closely and response to IV diuresis given documented history of soft blood pressures in the setting as well as a history of orthostatic hypotension.  Received Lasix 40 mg IV x1 in the ED today.  Of note, the patient was diagnosed with COVID-19 pneumonia in January 2022, with positive PCR on 09/08/2020.  The shortness of breath that he was experiencing at that time subsequently improved, and there is no indication for repeat COVID-19 testing at this time given the above timeframe and initial improvement in symptoms.   Plan: monitor strict I's & O's and daily weights. Monitor on telemetry, including trend in HR in response to diuresis, as above.  Lasix 20 g IV twice daily.  Monitor continuous pulse oximetry. Repeat BMP  in the morning, including for monitoring of trend of potassium, bicarbonate, and renal function in response to interval diuresis efforts. Check serum magnesium level.  Close monitoring of ensuing blood pressure, as above.  Echocardiogram has been ordered for the morning.      #) Generalized weakness: the patient reports 1 week duration of generalized weakness, in the absence of any evidence of acute focal neurologic deficits, including no evidence of acute focal weakness. Consequently, acute ischemic CVA is felt to be less likely at this time.  Suspect contribution from physiologic stress stemming from presenting acute on chronic diastolic heart failure, as above.  No evidence of underlying infectious process at this time.  No overt evidence of underlying infectious process, but will await by checking urinalysis.  Plan: Work-up and management of presenting acute on chronic diastolic heart failure, as above.  Physical therapy consult has been placed for the morning.  Check urinalysis as well as TSH.      #) Acute hypoosmolar hypervolemic hyponatremia: Presenting serum sodium noted to be 132 relative to most recent prior value of 136 in January 2022.  Suspect that this is hypovolemic in nature on the basis of presenting acute on chronic diastolic heart failure, as above.  Proceeding with gentle IV diuresis as management of acute on chronic diastolic heart failure, as further described above, with plan to closely monitor ensuing trend in serum sodium.  Plan: Work-up and management of presenting acute on chronic diastolic heart failure, including IV diuresis, as above.  Monitor strict I's and O's and daily weights.  Add on random urine sodium as well as urine osmolality.  We will also check serum osmolality to confirm suspected hypoosmolar etiology.  Repeat BMP in the morning.  Check TSH.      #) Paroxysmal atrial fibrillation: Documented history of such. In the setting of a CHA2DS2-VASc score of  6, there is an indication for the patient to be on chronic anticoagulation for thromboembolic prophylaxis.  However, in the setting of recurrent orthostatic hypotension and the associated increased fall risk, the patient reports that he is on a daily baby aspirin as opposed  to formal anticoagulation.  Not on any AV nodal blocking agents at home.  Most recent echocardiogram, as further described above.  Presenting EKG is suggestive of rate controlled atrial fibrillation.  Plan: Monitor strict I's and O's and daily weights.  Repeat BMP in the morning.  Check serum magnesium level.  Continue home daily baby aspirin.  Echocardiogram has been ordered for the morning.      #) COPD: Carries a documented history of such for which the patient closely follows with outpatient pulmonology, as further described above, with outpatient pulmonologist suspecting recent progression in shortness of breath is on the basis of acute on chronic heart failure as opposed to representing an acute COPD exacerbation.  Outpatient respiratory regimen, as further noted above.  Clinically, presentation appears less suggestive of acute COPD exacerbation.  Patient confirms that he is a former smoker.  Plan: Duo nebulizer treatments every 6 hours while awake.  As needed albuterol inhaler.  Monitor continuous pulse oximetry.  Check serum magnesium and phosphorus levels.  Check VBG.      #) BPH: On Flomax as an outpatient.  Plan: Monitor strict I's and O's and daily weights.  Repeat BMP in the morning.  Continue home Flomax.      #) History of orthostatic hypotension: Documented history of such, with risk for further exacerbation of this in the setting of IV diuresis efforts.  Reports good compliance with home midodrine therapy.  Plan: Continue midodrine.  Monitor strict I's and O's and daily weights.     DVT prophylaxis: scd's  Code Status: Per my discussions with the patient and his daughter today, the patient wishes  to be DNR/DNI Family Communication: I discussed the patient's case with his daughter, who is present at bedside. Disposition Plan: Per Rounding Team Consults called: none  Admission status: Inpatient; med telemetry.    Of note, this patient was added by me to the following Admit List/Treatment Team: [facility].      PLEASE NOTE THAT DRAGON DICTATION SOFTWARE WAS USED IN THE CONSTRUCTION OF THIS NOTE.   Angie Fava DO [facility] Pager [phone] From 12PM - 12AM  Otherwise, please contact night-coverage  [email or web address] Password [facility]   10/29/2020, 5:41 PM

## 2020-11-17 NOTE — ED Triage Notes (Addendum)
Pt to ER with daughter via POV. Pt referred via [facility] for CHF exacerbation with confirmed pleural effusions. Pt with swelling to bilateral lower extremities and increased weakness. Pt in NAD at this time with normal work of breathing.

## 2020-11-17 NOTE — ED Notes (Signed)
Sent msg to Dr Arlean Hopping to ask diet order to be modified to pureed food diet with thin liquids. Daughter states that SLP from [facility] ordered pureed food diet. Still thin liquids.

## 2020-11-18 ENCOUNTER — Inpatient Hospital Stay
Admit: 2020-11-18 | Discharge: 2020-11-18 | Disposition: A | Discharge status: Home / Self Care | Payer: Medicare HMO | Attending: Internal Medicine | Admitting: Internal Medicine

## 2020-11-18 DIAGNOSIS — J449 Chronic obstructive pulmonary disease, unspecified: Secondary | ICD-10-CM | POA: Diagnosis present

## 2020-11-18 DIAGNOSIS — N400 Enlarged prostate without lower urinary tract symptoms: Secondary | ICD-10-CM | POA: Diagnosis present

## 2020-11-18 DIAGNOSIS — I5033 Acute on chronic diastolic (congestive) heart failure: Secondary | ICD-10-CM | POA: Diagnosis not present

## 2020-11-18 DIAGNOSIS — R0602 Shortness of breath: Secondary | ICD-10-CM | POA: Diagnosis present

## 2020-11-18 DIAGNOSIS — E871 Hypo-osmolality and hyponatremia: Secondary | ICD-10-CM | POA: Diagnosis present

## 2020-11-18 LAB — ECHOCARDIOGRAM COMPLETE
AR max vel: 2.17 cm2
AV Area VTI: 2.13 cm2
AV Area mean vel: 1.76 cm2
AV Mean grad: 2 mmHg
AV Peak grad: 2.6 mmHg
Ao pk vel: 0.8 m/s
Area-P 1/2: 3.36 cm2
Height: 71 in
S' Lateral: 1.9 cm
Weight: 2272 oz

## 2020-11-18 LAB — BLOOD GAS, VENOUS
Acid-Base Excess: 5.9 mmol/L — ABNORMAL HIGH (ref 0.0–2.0)
Bicarbonate: 32.8 mmol/L — ABNORMAL HIGH (ref 20.0–28.0)
FIO2: 0.21
O2 Saturation: 78.7 %
Patient temperature: 37
pCO2, Ven: 58 mmHg (ref 44.0–60.0)
pH, Ven: 7.36 (ref 7.250–7.430)
pO2, Ven: 45 mmHg (ref 32.0–45.0)

## 2020-11-18 LAB — CBC
HCT: 32.4 % — ABNORMAL LOW (ref 39.0–52.0)
Hemoglobin: 10.9 g/dL — ABNORMAL LOW (ref 13.0–17.0)
MCH: 28.3 pg (ref 26.0–34.0)
MCHC: 33.6 g/dL (ref 30.0–36.0)
MCV: 84.2 fL (ref 80.0–100.0)
Platelets: 176 10*3/uL (ref 150–400)
RBC: 3.85 MIL/uL — ABNORMAL LOW (ref 4.22–5.81)
RDW: 16.1 % — ABNORMAL HIGH (ref 11.5–15.5)
WBC: 11 10*3/uL — ABNORMAL HIGH (ref 4.0–10.5)
nRBC: 0 % (ref 0.0–0.2)

## 2020-11-18 LAB — BASIC METABOLIC PANEL
Anion gap: 5 (ref 5–15)
BUN: 12 mg/dL (ref 8–23)
CO2: 31 mmol/L (ref 22–32)
Calcium: 8.5 mg/dL — ABNORMAL LOW (ref 8.9–10.3)
Chloride: 97 mmol/L — ABNORMAL LOW (ref 98–111)
Creatinine, Ser: 0.68 mg/dL (ref 0.61–1.24)
GFR, Estimated: >60 mL/min (ref >60)
Glucose, Bld: 123 mg/dL — ABNORMAL HIGH (ref 70–99)
Potassium: 3.5 mmol/L (ref 3.5–5.1)
Sodium: 133 mmol/L — ABNORMAL LOW (ref 135–145)

## 2020-11-18 LAB — TSH: TSH: 3.099 u[IU]/mL (ref 0.350–4.500)

## 2020-11-18 LAB — PHOSPHORUS: Phosphorus: 4 mg/dL (ref 2.5–4.6)

## 2020-11-18 LAB — MAGNESIUM: Magnesium: 1.9 mg/dL (ref 1.7–2.4)

## 2020-11-18 MED ORDER — IPRATROPIUM-ALBUTEROL 0.5-2.5 (3) MG/3ML IN SOLN
3.0000 mL | Freq: Four times a day (QID) | RESPIRATORY_TRACT | Status: DC
  Administered 2020-11-19 – 2020-11-20 (×6): 3 mL via RESPIRATORY_TRACT
  Filled 2020-11-18 (×6): qty 3

## 2020-11-18 MED ORDER — ADULT MULTIVITAMIN W/MINERALS CH
1.0000 | ORAL_TABLET | Freq: Every day | ORAL | Status: DC
  Administered 2020-11-18 – 2020-11-21 (×4): 1 via ORAL
  Filled 2020-11-18 (×4): qty 1

## 2020-11-18 MED ORDER — ENOXAPARIN SODIUM 40 MG/0.4ML ~~LOC~~ SOLN
40.0000 mg | SUBCUTANEOUS | Status: DC
  Administered 2020-11-18 – 2020-11-20 (×3): 40 mg via SUBCUTANEOUS
  Filled 2020-11-18 (×3): qty 0.4

## 2020-11-18 MED ORDER — ENSURE ENLIVE PO LIQD
237.0000 mL | Freq: Three times a day (TID) | ORAL | Status: DC
  Administered 2020-11-18 – 2020-11-21 (×7): 237 mL via ORAL

## 2020-11-18 NOTE — Progress Notes (Signed)
PROGRESS NOTE    Martin Bean  OZH:[id number] DOB: May 23, 1930 DOA: 29-Nov-2020 PCP: Mortimer Fries, PA    Brief Narrative:  87 year old male with a history of chronic diastolic heart failure, COPD, orthostatic hypotension, who is admitted with acute on chronic diastolic heart failure at presenting with progressive shortness of breath and progressive bilateral lower extremity edema.  11/16/2020, at which time Dr. Karna Christmas clinically felt that the patient's recent progressive shortness of breath is most consistent with an acute heart failure exacerbation as opposed to representing an exacerbation of his COPD.  At that time, [facility] Recommended that the patient present to [facility] ED for further evaluation of suspected acute on chronic heart failure, although the patient refused at that time.  At that time, Dr.Aleskerov Change the patient's home diuretic regimen from Lasix 20 mg p.o. daily to torsemide 20 mg p.o. daily, although the patient has not yet had the opportunity to start on his torsemide. Over the next day following this outpatient appt in pulmonary clinic, in the setting of further progression of his shortness of breath, the patient ultimately decided to present to [facility] ED for further evaluation of this shortness of breath. Chest x-ray, by way of comparison to imaging from 09/08/2020, showed bilateral pleural effusions as well as bibasilar airspace opacity, with the latter similar to prior imaging.  While in the ED, the following were administered: Lasix 40 mg IV x1.  Consultants:     Procedures: echo  Antimicrobials:       Subjective: Still sob, mildly better than yesterday. No cp. Unable to tell me if orthopnic.  Objective: Vitals:   11/18/20 0504 11/18/20 0751 11/18/20 1123 11/18/20 1506  BP: 129/68 132/62 111/61 128/79  Pulse: 88 (!) 102 94 95  Resp: 17 17    Temp: (!) 97.4 F (36.3 C) (!) 97.5 F (36.4 C)    TempSrc: Oral Oral    SpO2: 100% 98% 93% 94%  Weight:       Height:        Intake/Output Summary (Last 24 hours) at 11/18/2020 1542 Last data filed at 11/18/2020 0950 Gross per 24 hour  Intake 360 ml  Output 2550 ml  Net -2190 ml   Filed Weights   November 29, 2020 1137  Weight: 64.4 kg    Examination:  General exam: Appears calm and comfortable  Respiratory system: scattered rales at bases, no wheezing Cardiovascular system: S1 & S2 heard, RRR. No JVD, murmurs, rubs, gallops or clicks.  Gastrointestinal system: Abdomen is nondistended, soft and nontender. Normal bowel sounds heard. Central nervous system: Alert and oriented. Grossly intact Extremities: +Le b/l edema Skin: warm, dry Psychiatry: Judgement and insight appear normal. Mood & affect appropriate.     Data Reviewed: I have personally reviewed following labs and imaging studies  CBC: Recent Labs  Lab 2020-11-29 1143 11/18/20 0613  WBC 9.9 11.0*  HGB 11.0* 10.9*  HCT 32.8* 32.4*  MCV 84.1 84.2  PLT 175 176   Basic Metabolic Panel: Recent Labs  Lab 2020-11-29 1143 11-29-2020 1439 11/18/20 0613  NA 132*  --  133*  K 5.2*  --  3.5  CL 97*  --  97*  CO2 26  --  31  GLUCOSE 114*  --  123*  BUN 15  --  12  CREATININE 0.82  --  0.68  CALCIUM 8.9  --  8.5*  MG  --  1.9 1.9  PHOS  --   --  4.0   GFR: Estimated Creatinine Clearance: 58.1  mL/min (by C-G formula based on SCr of 0.68 mg/dL). Liver Function Tests: No results for input(s): AST, ALT, ALKPHOS, BILITOT, PROT, ALBUMIN in the last 168 hours. No results for input(s): LIPASE, AMYLASE in the last 168 hours. No results for input(s): AMMONIA in the last 168 hours. Coagulation Profile: No results for input(s): INR, PROTIME in the last 168 hours. Cardiac Enzymes: No results for input(s): CKTOTAL, CKMB, CKMBINDEX, TROPONINI in the last 168 hours. BNP (last 3 results) No results for input(s): PROBNP in the last 8760 hours. HbA1C: No results for input(s): HGBA1C in the last 72 hours. CBG: No results for input(s): GLUCAP in  the last 168 hours. Lipid Profile: No results for input(s): CHOL, HDL, LDLCALC, TRIG, CHOLHDL, LDLDIRECT in the last 72 hours. Thyroid Function Tests: Recent Labs    11/18/20 0613  TSH 3.099   Anemia Panel: No results for input(s): VITAMINB12, FOLATE, FERRITIN, TIBC, IRON, RETICCTPCT in the last 72 hours. Sepsis Labs: No results for input(s): PROCALCITON, LATICACIDVEN in the last 168 hours.  No results found for this or any previous visit (from the past 240 hour(s)).       Radiology Studies: DG Chest 2 View  Result Date: 12/04/2020 CLINICAL DATA:  Shortness of breath EXAM: CHEST - 2 VIEW COMPARISON:  09/08/2020 FINDINGS: The heart size and mediastinal contours are within normal limits. Trace bilateral pleural effusions and subtle dependent bibasilar heterogeneous airspace opacity, similar to prior examination. The visualized skeletal structures are unremarkable. IMPRESSION: Trace bilateral pleural effusions and subtle dependent bibasilar heterogeneous airspace opacity, similar to prior examination. No new airspace opacity. Electronically Signed   By: Lauralyn Primes M.D.   On: 12/13/2020 12:32   ECHOCARDIOGRAM COMPLETE  Result Date: 11/18/2020    ECHOCARDIOGRAM REPORT   Patient Name:   Martin Bean Date of Exam: 11/18/2020 Medical Rec #:  [id number]       Height:       71.0 in Accession #:    [id number]      Weight:       142.0 lb Date of Birth:  [birth year]       BSA:          1.823 m Patient Age:    87 years        BP:           111/61 mmHg Patient Gender: M               HR:           94 bpm. Exam Location:  [facility] Procedure: 2D Echo, Cardiac Doppler and Color Doppler Indications:     CHF I50.9  History:         Patient has prior history of Echocardiogram examinations, most                  recent 07/18/2020. CHF, COPD; Risk Factors:Hypertension.                  Pneumonia.  Sonographer:     Cristela Blue RDCS (AE) Referring Phys:  [id number] Angie Fava Diagnosing Phys: Marcina Millard MD  IMPRESSIONS  1. Left ventricular ejection fraction, by estimation, is 60 to 65%. The left ventricle has normal function. The left ventricle has no regional wall motion abnormalities. Left ventricular diastolic parameters are indeterminate.  2. Right ventricular systolic function is normal. The right ventricular size is normal.  3. The mitral valve is normal in structure. Mild mitral valve regurgitation. No evidence of mitral stenosis.  4. The aortic valve is normal in structure. Aortic valve regurgitation is not visualized. Mild aortic valve sclerosis is present, with no evidence of aortic valve stenosis.  5. The inferior vena cava is normal in size with greater than 50% respiratory variability, suggesting right atrial pressure of 3 mmHg. FINDINGS  Left Ventricle: Left ventricular ejection fraction, by estimation, is 60 to 65%. The left ventricle has normal function. The left ventricle has no regional wall motion abnormalities. The left ventricular internal cavity size was normal in size. There is  no left ventricular hypertrophy. Left ventricular diastolic parameters are indeterminate. Right Ventricle: The right ventricular size is normal. No increase in right ventricular wall thickness. Right ventricular systolic function is normal. Left Atrium: Left atrial size was normal in size. Right Atrium: Right atrial size was normal in size. Pericardium: There is no evidence of pericardial effusion. Mitral Valve: The mitral valve is normal in structure. Mild mitral valve regurgitation. No evidence of mitral valve stenosis. Tricuspid Valve: The tricuspid valve is normal in structure. Tricuspid valve regurgitation is mild . No evidence of tricuspid stenosis. Aortic Valve: The aortic valve is normal in structure. Aortic valve regurgitation is not visualized. Mild aortic valve sclerosis is present, with no evidence of aortic valve stenosis. Aortic valve mean gradient measures 2.0 mmHg. Aortic valve peak gradient measures 2.6  mmHg. Aortic valve area, by VTI measures 2.13 cm. Pulmonic Valve: The pulmonic valve was normal in structure. Pulmonic valve regurgitation is not visualized. No evidence of pulmonic stenosis. Aorta: The aortic root is normal in size and structure. Venous: The inferior vena cava is normal in size with greater than 50% respiratory variability, suggesting right atrial pressure of 3 mmHg. IAS/Shunts: No atrial level shunt detected by color flow Doppler.  LEFT VENTRICLE PLAX 2D LVIDd:         3.66 cm LVIDs:         1.90 cm LV PW:         1.30 cm LV IVS:        0.88 cm LVOT diam:     2.00 cm LV SV:         28 LV SV Index:   15 LVOT Area:     3.14 cm  RIGHT VENTRICLE RV Basal diam:  2.64 cm RV S prime:     9.03 cm/s TAPSE (M-mode): 3.4 cm LEFT ATRIUM             Index       RIGHT ATRIUM           Index LA diam:        3.80 cm 2.08 cm/m  RA Area:     17.60 cm LA Vol (A2C):   43.8 ml 24.03 ml/m RA Volume:   41.60 ml  22.82 ml/m LA Vol (A4C):   16.3 ml 8.94 ml/m LA Biplane Vol: 27.9 ml 15.30 ml/m  AORTIC VALVE                   PULMONIC VALVE AV Area (Vmax):    2.17 cm    PV Vmax:        0.69 m/s AV Area (Vmean):   1.76 cm    PV Peak grad:   1.9 mmHg AV Area (VTI):     2.13 cm    RVOT Peak grad: 2 mmHg AV Vmax:           80.30 cm/s AV Vmean:  63.200 cm/s AV VTI:            0.131 m AV Peak Grad:      2.6 mmHg AV Mean Grad:      2.0 mmHg LVOT Vmax:         55.50 cm/s LVOT Vmean:        35.500 cm/s LVOT VTI:          0.089 m LVOT/AV VTI ratio: 0.68  AORTA Ao Root diam: 3.30 cm MITRAL VALVE               TRICUSPID VALVE MV Area (PHT): 3.36 cm    TR Peak grad:   23.4 mmHg MV Decel Time: 226 msec    TR Vmax:        242.00 cm/s MV E velocity: 78.80 cm/s                            SHUNTS                            Systemic VTI:  0.09 m                            Systemic Diam: 2.00 cm Marcina Millard MD Electronically signed by Marcina Millard MD Signature Date/Time: 11/18/2020/3:10:11 PM    Final          Scheduled Meds: . aspirin EC  81 mg Oral Daily  . feeding supplement  237 mL Oral TID BM  . furosemide  20 mg Intravenous BID  . guaiFENesin  600 mg Oral Daily  . [START ON 11/19/2020] ipratropium-albuterol  3 mL Nebulization Q6H  . midodrine  5 mg Oral TID WC  . mirtazapine  7.5 mg Oral QHS  . multivitamin with minerals  1 tablet Oral Daily  . pantoprazole  40 mg Oral Daily  . sertraline  25 mg Oral Daily  . tamsulosin  0.4 mg Oral QHS   Continuous Infusions:  Assessment & Plan:   Principal Problem:   Acute on chronic diastolic heart failure (HCC) Active Problems:   Generalized weakness   SOB (shortness of breath)   Acute hyponatremia   Prostate enlargement   COPD (chronic obstructive pulmonary disease) (HCC)   #) Acute on chronic diastolic heart failure: Still symptomatic and volume overloaded Echo with EF of 60 to 65%, diastolic parameters are indeterminate due to A. Fib Continue IV Lasix Monitor renal function blood pressure closely I's and O's Daily weight     #) Generalized weakness: TSH normal PT OT consulted   #) Acute hypoosmolar hypervolemic hyponatremia:  Asymptomatic Sodium improving with diuresis  Sodium level 133 today  Continue to monitor      #) Paroxysmal atrial fibrillation: Documented history of such. In the setting of a CHA2DS2-VASc score of 6, there is an indication for the patient to be on chronic anticoagulation for thromboembolic prophylaxis.  However, in the setting of recurrent orthostatic hypotension and the associated increased fall risk, the patient reports that he is on a daily baby aspirin as opposed to formal anticoagulation.  Not on any AV nodal blocking agents at home.  4/1-Echo with normal EF Heart rate control Monitor closely Continue aspirin      #) COPD: Without acute exacerbation  Continue MDIs and duo nebs     #) BPH: On Flomax      #)  History of orthostatic hypotension:   Continue midodrine      DVT prophylaxis: Lovenox Code Status: DNR Family Communication: Updated daughter [name] Status is: Inpatient  Remains inpatient appropriate because:IV treatments appropriate due to intensity of illness or inability to take PO   Dispo: The patient is from: Home              Anticipated d/c is to: Home              Patient currently is not medically stable to d/c.   Difficult to place patient No            LOS: 1 day   Time spent: 35 minutes with more than 50% on COC    Lynn ItoSahar Katanya Schlie, MD [facility] Pager [phone]  If 7PM-7AM, please contact night-coverage 11/18/2020, 3:42 PM

## 2020-11-18 NOTE — Plan of Care (Signed)
Pt is AAOx4. Pt is taking IV lasix. External cath intact. Adequate urine output. VSS. Pt afebrile. Pt denies any pain. Bed alarm is on. All needs attended. Call light is within reach. Safety measures maintained will continue to monitor.    Problem: Education: Goal: Knowledge of General Education information will improve Description: Including pain rating scale, medication(s)/side effects and non-pharmacologic comfort measures Outcome: Progressing   Problem: Health Behavior/Discharge Planning: Goal: Ability to manage health-related needs will improve Outcome: Progressing   Problem: Clinical Measurements: Goal: Ability to maintain clinical measurements within normal limits will improve Outcome: Progressing Goal: Will remain free from infection Outcome: Progressing Goal: Diagnostic test results will improve Outcome: Progressing Goal: Respiratory complications will improve Outcome: Progressing Goal: Cardiovascular complication will be avoided Outcome: Progressing   Problem: Activity: Goal: Risk for activity intolerance will decrease Outcome: Progressing   Problem: Nutrition: Goal: Adequate nutrition will be maintained Outcome: Progressing   Problem: Coping: Goal: Level of anxiety will decrease Outcome: Progressing   Problem: Elimination: Goal: Will not experience complications related to bowel motility Outcome: Progressing Goal: Will not experience complications related to urinary retention Outcome: Progressing   Problem: Pain Managment: Goal: General experience of comfort will improve Outcome: Progressing   Problem: Safety: Goal: Ability to remain free from injury will improve Outcome: Progressing   Problem: Skin Integrity: Goal: Risk for impaired skin integrity will decrease Outcome: Progressing   Problem: Education: Goal: Ability to demonstrate management of disease process will improve Outcome: Progressing Goal: Ability to verbalize understanding of  medication therapies will improve Outcome: Progressing Goal: Individualized Educational Video(s) Outcome: Progressing   Problem: Activity: Goal: Capacity to carry out activities will improve Outcome: Progressing   Problem: Cardiac: Goal: Ability to achieve and maintain adequate cardiopulmonary perfusion will improve Outcome: Progressing

## 2020-11-18 NOTE — Progress Notes (Signed)
OT Cancellation Note  Patient Details Name: Martin Bean MRN: [id number] DOB: 05/14/1930   Cancelled Treatment:    Reason Eval/Treat Not Completed: Patient at procedure or test/ unavailable  When OT attempted first time, heart failure coordinator was educating patient. Now pt currently getting Echo. Will f/u for OT evaluation at later date/time as able/pt available. Thank you.  Rejeana Brock, MS, OTR/L ascom [phone] 11/18/20, 2:06 PM

## 2020-11-18 NOTE — Consult Note (Signed)
   Heart failure nurse navigator note.  HFpEF 60 to 65%.  Patient was seen by pulmonology and felt with the progression in his shortness of breath that he should present to the emergency room at [facility].  Comorbidities:  COPD Paroxysmal atrial fibrillation Orthostatic hypotension   Medications:  Aspirin 81 mg daily  Furosemide 20 mg IV 2 times a day Midodrine 5 mg 3 times a day with meals   Labs:  Sodium 133, potassium 3.5, chloride 97, CO2 31, BUN 12, creatinine 0.68, magnesium 1.9 Intake not documented Output 3050 mL Blood pressure 128/79 Weight 64.4 kg   Assessment:  General he is awake and alert lying in bed in no acute distress, frail in appearance.  HEENT-pupils are equal, normocephalic.  Cardiac-heart tones of regular rate and rhythm.  Chest-fine crackles in the bases  Abdomen-soft nontender  Legs-trace edema, skin is wrinkled.  Psych-is pleasant and appropriate makes good eye contact.  Neurologic-speech is clear moves all extremities without difficulty.     Initial meeting with patient today.  States that he resides in [facility].  States that they do not weigh him on a daily basis he said it might get done once a month.  Does not use salt at the table.  Was given the living with heart failure teaching booklet along with appointment time to be seen in the outpatient heart failure clinic.   We will continue to follow.   Tresa Endo RN CHFN

## 2020-11-18 NOTE — Progress Notes (Signed)
*  PRELIMINARY RESULTS* Echocardiogram 2D Echocardiogram has been performed.  Cristela Blue 11/18/2020, 2:25 PM

## 2020-11-18 NOTE — Evaluation (Signed)
Physical Therapy Evaluation Patient Details Name: Martin Bean MRN: [id number] DOB: 1930/01/11 Today's Date: 11/18/2020   History of Present Illness  Martin Bean is a 87 y.o. male with medical history significant for history of CVA, hypertension, chronic diastolic CHF, COPD, HTN, and depression. Brought to ED from [facility] clinic for CHF exacerbation with pleural effusion and BLE swelling and weakness. Per previous documentation pt has had increased SOB for months and worsening in the last few weeks. Recent diagnosis of PNA from COVID-19.    Clinical Impression  Pt admitted with above diagnosis. Pt received supine with HOB elevated. Agreeable to PT. RN intermittently entering throughout PT session to fix telemetry unit on pt. Monitored vitals via pulse ox and pt response throughout therapy session. Pt required supervision for supine to EOB with HOB elevated with use of bed rails and extra time to complete. Seated exercises performed before standing with RW. With bed elevated and modA+1 pt stood at [facility] for ~1 min before requesting to sit. Returned to supine with same level assist as mentioned above. Prior to hospitalization, pt was ambulating in room at ALF and to meals with intermittent staff assist with ADL's. Pt is below his PLOF. Pt required PT encouragement on abilities due to being self limiting throughout the evaluation. Pt educated to stand and transfer to recliner with nursing staff later this p.m. and importance of continued mobility during his admission. Pt currently with functional limitations due to the deficits listed below (see PT Problem List). Pt will benefit from skilled PT to increase their independence and safety with mobility to allow discharge to the venue listed below.       Follow Up Recommendations SNF    Equipment Recommendations  Other (comment) (defer to next level of care if D/c to SNF)    Recommendations for Other Services       Precautions / Restrictions  Precautions Precautions: Fall Restrictions Weight Bearing Restrictions: No      Mobility  Bed Mobility Overal bed mobility: Needs Assistance Bed Mobility: Rolling;Sidelying to Sit;Supine to Sit;Sit to Supine Rolling: Supervision Sidelying to sit: Supervision;HOB elevated Supine to sit: HOB elevated;Supervision Sit to supine: Supervision;HOB elevated   General bed mobility comments: Increased time to perform.    Transfers Overall transfer level: Needs assistance Equipment used: Rolling walker (2 wheeled) Transfers: Sit to/from Stand Sit to Stand: Mod assist;From elevated surface         General transfer comment: Use of RW  Ambulation/Gait                Stairs            Wheelchair Mobility    Modified Rankin (Stroke Patients Only)       Balance Overall balance assessment: Needs assistance Sitting-balance support: Bilateral upper extremity supported Sitting balance-Leahy Scale: Fair       Standing balance-Leahy Scale: Poor                               Pertinent Vitals/Pain Pain Assessment: No/denies pain    Home Living Family/patient expects to be discharged to:: Assisted living               Home Equipment: Wheelchair - [employer] - 4 wheels Additional Comments: Resident of [facility]    Prior Function Level of Independence: Needs assistance         Comments: Pt reports Mod-I for ambulation in his room at  [facility] and to meals. Requires intemittent assist from staff with dressing and bathing ADL's     Hand Dominance   Dominant Hand: Right    Extremity/Trunk Assessment   Upper Extremity Assessment Upper Extremity Assessment: Generalized weakness    Lower Extremity Assessment Lower Extremity Assessment: Generalized weakness    Cervical / Trunk Assessment Cervical / Trunk Assessment: Normal  Communication   Communication: HOH  Cognition Arousal/Alertness: Awake/alert Behavior During  Therapy: WFL for tasks assessed/performed Overall Cognitive Status: Within Functional Limits for tasks assessed                                        General Comments      Exercises General Exercises - Lower Extremity Ankle Circles/Pumps: AROM;Strengthening;Both;10 reps Hip Flexion/Marching: Seated;AROM;Strengthening;Both;10 reps Toe Raises: AROM;Strengthening;Both;10 reps;Seated Heel Raises: AROM;Strengthening;Both;10 reps;Seated Other Exercises Other Exercises: Supine with HOB elevated <>standing with RW. ModA+1 for Standing   Assessment/Plan    PT Assessment Patient needs continued PT services  PT Problem List Decreased strength;Decreased activity tolerance;Decreased balance;Decreased mobility       PT Treatment Interventions DME instruction;Balance training;Gait training;Neuromuscular re-education;Stair training;Functional mobility training;Patient/family education;Therapeutic activities;Therapeutic exercise    PT Goals (Current goals can be found in the Care Plan section)  Acute Rehab PT Goals Patient Stated Goal: return to ALF PT Goal Formulation: With patient Time For Goal Achievement: 11/20/20 Potential to Achieve Goals: Good    Frequency Min 2X/week   Barriers to discharge        Co-evaluation               AM-PAC PT "6 Clicks" Mobility  Outcome Measure Help needed turning from your back to your side while in a flat bed without using bedrails?: A Little Help needed moving from lying on your back to sitting on the side of a flat bed without using bedrails?: A Little Help needed moving to and from a bed to a chair (including a wheelchair)?: A Lot Help needed standing up from a chair using your arms (e.g., wheelchair or bedside chair)?: A Lot Help needed to walk in hospital room?: A Lot Help needed climbing 3-5 steps with a railing? : A Lot 6 Click Score: 14    End of Session Equipment Utilized During Treatment: Gait  belt;Oxygen Activity Tolerance: Patient tolerated treatment well (self limiting) Patient left: in bed;with call bell/phone within reach;with bed alarm set Nurse Communication: Mobility status PT Visit Diagnosis: Unsteadiness on feet (R26.81);Difficulty in walking, not elsewhere classified (R26.2);Other abnormalities of gait and mobility (R26.89);Muscle weakness (generalized) (M62.81)    Time: 1448-1856 PT Time Calculation (min) (ACUTE ONLY): 29 min   Charges:   PT Evaluation $PT Eval Moderate Complexity: 1 Mod PT Treatments $Therapeutic Exercise: 23-37 mins       Wyolene Weimann M. Fairly IV, PT, DPT Physical Therapist- [facility]  11/18/2020, 3:42 PM

## 2020-11-18 NOTE — Progress Notes (Signed)
Initial Nutrition Assessment  DOCUMENTATION CODES:  Severe malnutrition in context of chronic illness  INTERVENTION:  Increase Ensure Enlive po BID, each supplement provides 350 kcal and 20 grams of protein  Add Magic cup TID with meals, each supplement provides 290 kcal and 9 grams of protein  Add MVI with minerals po daily  NUTRITION DIAGNOSIS:  Severe Malnutrition related to chronic illness (COPD, CHF, dysphagia) as evidenced by severe muscle depletion,severe fat depletion.  GOAL:  Patient will meet greater than or equal to 90% of their needs  MONITOR:  PO intake,Supplement acceptance  REASON FOR ASSESSMENT:  Malnutrition Screening Tool    ASSESSMENT:  Pt admitted with SOB and BLE edema worsening over the last 1-2 weeks. Followed by pulmonology outpatient who recommend pt present to ED. Recently admitted 1/20-1/25 for COVID19 pneumonia. PMH relevant for CHF, CVA, HLD, malnutrition, COPD  Medications reviewed and include: lasix, remeron, receiving Ensure Enlive BID   Labs reviewed: Na 133. Last A1c 5.7 1/21   Pt resting in bed at the time of visit. Pt hard of hearing but awake and alert. Pt reports that he thinks his appetite is good, noted 100% intake x 1 recorded meal this admission. States that he resides at a nursing facility, consumes all three meals there each day. Hx of dysphagia noted and requiring modified texture and thickened liquids. Uses a walker to ambulate. Pt reports that he drinks ensure at his facility and has received them during hospitalization. Likes chocolate flavors best. Pt denies weight loss - states he has actually gained recently (not seen in weight hx).  NUTRITION - FOCUSED PHYSICAL EXAM: Flowsheet Row Most Recent Value  Orbital Region Severe depletion  Upper Arm Region Moderate depletion  Thoracic and Lumbar Region Severe depletion  Buccal Region Severe depletion  Temple Region Severe depletion  Clavicle Bone Region Severe depletion  Clavicle  and Acromion Bone Region Severe depletion  Scapular Bone Region Severe depletion  Dorsal Hand Moderate depletion  Patellar Region Moderate depletion  Anterior Thigh Region Moderate depletion  Posterior Calf Region Moderate depletion  Edema (RD Assessment) None  Hair Reviewed  Eyes Reviewed  Mouth Reviewed  Skin Reviewed  Nails Reviewed     Diet Order:   Diet Order            DIET - DYS 1 Room service appropriate? Yes; Fluid consistency: Nectar Thick  Diet effective now                EDUCATION NEEDS:  No education needs have been identified at this time  Skin:  Skin Assessment: Reviewed RN Assessment (Ecchymosis)  Last BM:  3/31 per RN documentation  Height:  Ht Readings from Last 1 Encounters:  12-04-2020 5\' 11"  (1.803 m)   Weight:  Wt Readings from Last 5 Encounters:  December 04, 2020 64.4 kg  09/08/20 68 kg  08/03/20 67.6 kg  07/27/20 67.5 kg  03/17/19 65.8 kg   Ideal Body Weight:  78.2 kg  BMI:  Body mass index is 19.8 kg/m.  Estimated Nutritional Needs:  Kcal:  1900-2200 kcal  Protein:  95-105g  Fluid:  2L/d  03/19/19, RD, LDN Clinical Dietitian Pager on Amion

## 2020-11-18 DEATH — deceased

## 2020-11-19 DIAGNOSIS — E876 Hypokalemia: Secondary | ICD-10-CM | POA: Diagnosis not present

## 2020-11-19 DIAGNOSIS — I5033 Acute on chronic diastolic (congestive) heart failure: Secondary | ICD-10-CM | POA: Diagnosis not present

## 2020-11-19 DIAGNOSIS — R531 Weakness: Secondary | ICD-10-CM | POA: Diagnosis not present

## 2020-11-19 LAB — BASIC METABOLIC PANEL
Anion gap: 9 (ref 5–15)
BUN: 18 mg/dL (ref 8–23)
CO2: 30 mmol/L (ref 22–32)
Calcium: 8.2 mg/dL — ABNORMAL LOW (ref 8.9–10.3)
Chloride: 95 mmol/L — ABNORMAL LOW (ref 98–111)
Creatinine, Ser: 0.61 mg/dL (ref 0.61–1.24)
GFR, Estimated: >60 mL/min (ref >60)
Glucose, Bld: 133 mg/dL — ABNORMAL HIGH (ref 70–99)
Potassium: 3.1 mmol/L — ABNORMAL LOW (ref 3.5–5.1)
Sodium: 134 mmol/L — ABNORMAL LOW (ref 135–145)

## 2020-11-19 MED ORDER — POTASSIUM CHLORIDE CRYS ER 20 MEQ PO TBCR
40.0000 meq | EXTENDED_RELEASE_TABLET | Freq: Two times a day (BID) | ORAL | Status: AC
  Administered 2020-11-19 (×2): 40 meq via ORAL
  Filled 2020-11-19 (×2): qty 2

## 2020-11-19 MED ORDER — FUROSEMIDE 10 MG/ML IJ SOLN
40.0000 mg | Freq: Once | INTRAMUSCULAR | Status: AC
  Administered 2020-11-19: 40 mg via INTRAVENOUS
  Filled 2020-11-19: qty 4

## 2020-11-19 NOTE — Progress Notes (Addendum)
PROGRESS NOTE    Martin Bean  YPP:[id number] DOB: July 08, 1930 DOA: 11/30/2020 PCP: Mortimer Fries, PA   Assessment & Plan:   Principal Problem:   Acute on chronic diastolic heart failure (HCC) Active Problems:   Generalized weakness   SOB (shortness of breath)   Acute hyponatremia   Prostate enlargement   COPD (chronic obstructive pulmonary disease) (HCC)   Acuteon chronic diastolic heart failure: continue on IV lasix. Monitor I/Os and daily weights. Neg approx 1.7L   Hypokalemia: KCl repleted. Will continue to monitor   Generalized weakness:PT recs SNF   Hypervolemic hyponatremia:continues to trend up, almost WNL   PAF: not on any rate controlling meds. Not on anticoagulation secondary to high fall risk. Continue on aspirin   COPD: w/o exacerbation. Continue on bronchodilators and encourage incentive spirometry   BPH: continue on flomax   Hx of chronic orthostatic hypotension:continue on home dose of midodrine   Normocytic anemia: no need for a transfusion currently   Depression: severity unknown. Continue on home dose of sertraline   DVT prophylaxis: lovenox  Code Status: DNR Family Communication: called pt's son, Kathlene November, no answer so I left a message  Disposition Plan: depends on PT/OT recs   Level of care: Med-Surg   Status is: Inpatient  Remains inpatient appropriate because:Unsafe d/c plan, IV treatments appropriate due to intensity of illness or inability to take PO and Inpatient level of care appropriate due to severity of illness   Dispo: The patient is from: Home              Anticipated d/c is to: Home              Patient currently is not medically stable to d/c.   Difficult to place patient No        Consultants:      Procedures:    Antimicrobials:    Subjective: Pt c/o shortness of breath, unchanged from day prior.  Objective: Vitals:   11/18/20 2006 11/19/20 0408 11/19/20 0725 11/19/20 0746  BP: 98/64 (!) 110/58 (!)  102/58 98/60  Pulse: 79 77  79  Resp: 18 18    Temp: 97.8 F (36.6 C) 98 F (36.7 C)    TempSrc: Oral     SpO2: 98% 96%  95%  Weight:      Height:        Intake/Output Summary (Last 24 hours) at 11/19/2020 0901 Last data filed at 11/19/2020 0500 Gross per 24 hour  Intake 360 ml  Output 2100 ml  Net -1740 ml   Filed Weights   11-30-20 1137  Weight: 64.4 kg    Examination:  General exam: Appears calm and comfortable  Respiratory system: diminished breath sounds b/l  Cardiovascular system: S1 & S2 +. No  rubs, gallops or clicks. Gastrointestinal system: Abdomen is nondistended, soft and nontender. Normal bowel sounds heard. Central nervous system: Alert and oriented. Moves all extremities  Psychiatry: Judgement and insight appear normal. Mood & affect appropriate.     Data Reviewed: I have personally reviewed following labs and imaging studies  CBC: Recent Labs  Lab 11-30-20 1143 11/18/20 0613  WBC 9.9 11.0*  HGB 11.0* 10.9*  HCT 32.8* 32.4*  MCV 84.1 84.2  PLT 175 176   Basic Metabolic Panel: Recent Labs  Lab 11-30-2020 1143 11/30/2020 1439 11/18/20 0613 11/19/20 0348  NA 132*  --  133* 134*  K 5.2*  --  3.5 3.1*  CL 97*  --  97* 95*  CO2  26  --  31 30  GLUCOSE 114*  --  123* 133*  BUN 15  --  12 18  CREATININE 0.82  --  0.68 0.61  CALCIUM 8.9  --  8.5* 8.2*  MG  --  1.9 1.9  --   PHOS  --   --  4.0  --    GFR: Estimated Creatinine Clearance: 58.1 mL/min (by C-G formula based on SCr of 0.61 mg/dL). Liver Function Tests: No results for input(s): AST, ALT, ALKPHOS, BILITOT, PROT, ALBUMIN in the last 168 hours. No results for input(s): LIPASE, AMYLASE in the last 168 hours. No results for input(s): AMMONIA in the last 168 hours. Coagulation Profile: No results for input(s): INR, PROTIME in the last 168 hours. Cardiac Enzymes: No results for input(s): CKTOTAL, CKMB, CKMBINDEX, TROPONINI in the last 168 hours. BNP (last 3 results) No results for input(s):  PROBNP in the last 8760 hours. HbA1C: No results for input(s): HGBA1C in the last 72 hours. CBG: No results for input(s): GLUCAP in the last 168 hours. Lipid Profile: No results for input(s): CHOL, HDL, LDLCALC, TRIG, CHOLHDL, LDLDIRECT in the last 72 hours. Thyroid Function Tests: Recent Labs    11/18/20 0613  TSH 3.099   Anemia Panel: No results for input(s): VITAMINB12, FOLATE, FERRITIN, TIBC, IRON, RETICCTPCT in the last 72 hours. Sepsis Labs: No results for input(s): PROCALCITON, LATICACIDVEN in the last 168 hours.  No results found for this or any previous visit (from the past 240 hour(s)).       Radiology Studies: DG Chest 2 View  Result Date: May 22, 2021 CLINICAL DATA:  Shortness of breath EXAM: CHEST - 2 VIEW COMPARISON:  09/08/2020 FINDINGS: The heart size and mediastinal contours are within normal limits. Trace bilateral pleural effusions and subtle dependent bibasilar heterogeneous airspace opacity, similar to prior examination. The visualized skeletal structures are unremarkable. IMPRESSION: Trace bilateral pleural effusions and subtle dependent bibasilar heterogeneous airspace opacity, similar to prior examination. No new airspace opacity. Electronically Signed   By: Lauralyn PrimesAlex  Bibbey M.D.   On: 0Oct 03, 2022 12:32   ECHOCARDIOGRAM COMPLETE  Result Date: 11/18/2020    ECHOCARDIOGRAM REPORT   Patient Name:   Martin Bean Date of Exam: 11/18/2020 Medical Rec #:  [id number]       Height:       71.0 in Accession #:    [id number]      Weight:       142.0 lb Date of Birth:  02/04/1930       BSA:          1.823 m Patient Age:    87 years        BP:           111/61 mmHg Patient Gender: M               HR:           94 bpm. Exam Location:  [facility] Procedure: 2D Echo, Cardiac Doppler and Color Doppler Indications:     CHF I50.9  History:         Patient has prior history of Echocardiogram examinations, most                  recent 07/18/2020. CHF, COPD; Risk Factors:Hypertension.                   Pneumonia.  Sonographer:     Cristela BlueJerry Hege RDCS (AE) Referring Phys:  [id number] Angie FavaJUSTIN B HOWERTER Diagnosing Phys: Marcina Millardlexander Paraschos  MD IMPRESSIONS  1. Left ventricular ejection fraction, by estimation, is 60 to 65%. The left ventricle has normal function. The left ventricle has no regional wall motion abnormalities. Left ventricular diastolic parameters are indeterminate.  2. Right ventricular systolic function is normal. The right ventricular size is normal.  3. The mitral valve is normal in structure. Mild mitral valve regurgitation. No evidence of mitral stenosis.  4. The aortic valve is normal in structure. Aortic valve regurgitation is not visualized. Mild aortic valve sclerosis is present, with no evidence of aortic valve stenosis.  5. The inferior vena cava is normal in size with greater than 50% respiratory variability, suggesting right atrial pressure of 3 mmHg. FINDINGS  Left Ventricle: Left ventricular ejection fraction, by estimation, is 60 to 65%. The left ventricle has normal function. The left ventricle has no regional wall motion abnormalities. The left ventricular internal cavity size was normal in size. There is  no left ventricular hypertrophy. Left ventricular diastolic parameters are indeterminate. Right Ventricle: The right ventricular size is normal. No increase in right ventricular wall thickness. Right ventricular systolic function is normal. Left Atrium: Left atrial size was normal in size. Right Atrium: Right atrial size was normal in size. Pericardium: There is no evidence of pericardial effusion. Mitral Valve: The mitral valve is normal in structure. Mild mitral valve regurgitation. No evidence of mitral valve stenosis. Tricuspid Valve: The tricuspid valve is normal in structure. Tricuspid valve regurgitation is mild . No evidence of tricuspid stenosis. Aortic Valve: The aortic valve is normal in structure. Aortic valve regurgitation is not visualized. Mild aortic valve sclerosis is  present, with no evidence of aortic valve stenosis. Aortic valve mean gradient measures 2.0 mmHg. Aortic valve peak gradient measures 2.6 mmHg. Aortic valve area, by VTI measures 2.13 cm. Pulmonic Valve: The pulmonic valve was normal in structure. Pulmonic valve regurgitation is not visualized. No evidence of pulmonic stenosis. Aorta: The aortic root is normal in size and structure. Venous: The inferior vena cava is normal in size with greater than 50% respiratory variability, suggesting right atrial pressure of 3 mmHg. IAS/Shunts: No atrial level shunt detected by color flow Doppler.  LEFT VENTRICLE PLAX 2D LVIDd:         3.66 cm LVIDs:         1.90 cm LV PW:         1.30 cm LV IVS:        0.88 cm LVOT diam:     2.00 cm LV SV:         28 LV SV Index:   15 LVOT Area:     3.14 cm  RIGHT VENTRICLE RV Basal diam:  2.64 cm RV S prime:     9.03 cm/s TAPSE (M-mode): 3.4 cm LEFT ATRIUM             Index       RIGHT ATRIUM           Index LA diam:        3.80 cm 2.08 cm/m  RA Area:     17.60 cm LA Vol (A2C):   43.8 ml 24.03 ml/m RA Volume:   41.60 ml  22.82 ml/m LA Vol (A4C):   16.3 ml 8.94 ml/m LA Biplane Vol: 27.9 ml 15.30 ml/m  AORTIC VALVE                   PULMONIC VALVE AV Area (Vmax):    2.17 cm    PV Vmax:  0.69 m/s AV Area (Vmean):   1.76 cm    PV Peak grad:   1.9 mmHg AV Area (VTI):     2.13 cm    RVOT Peak grad: 2 mmHg AV Vmax:           80.30 cm/s AV Vmean:          63.200 cm/s AV VTI:            0.131 m AV Peak Grad:      2.6 mmHg AV Mean Grad:      2.0 mmHg LVOT Vmax:         55.50 cm/s LVOT Vmean:        35.500 cm/s LVOT VTI:          0.089 m LVOT/AV VTI ratio: 0.68  AORTA Ao Root diam: 3.30 cm MITRAL VALVE               TRICUSPID VALVE MV Area (PHT): 3.36 cm    TR Peak grad:   23.4 mmHg MV Decel Time: 226 msec    TR Vmax:        242.00 cm/s MV E velocity: 78.80 cm/s                            SHUNTS                            Systemic VTI:  0.09 m                            Systemic Diam:  2.00 cm Marcina Millard MD Electronically signed by Marcina Millard MD Signature Date/Time: 11/18/2020/3:10:11 PM    Final         Scheduled Meds: . aspirin EC  81 mg Oral Daily  . enoxaparin (LOVENOX) injection  40 mg Subcutaneous Q24H  . feeding supplement  237 mL Oral TID BM  . furosemide  20 mg Intravenous BID  . guaiFENesin  600 mg Oral Daily  . ipratropium-albuterol  3 mL Nebulization Q6H  . midodrine  5 mg Oral TID WC  . mirtazapine  7.5 mg Oral QHS  . multivitamin with minerals  1 tablet Oral Daily  . pantoprazole  40 mg Oral Daily  . sertraline  25 mg Oral Daily  . tamsulosin  0.4 mg Oral QHS   Continuous Infusions:   LOS: 2 days    Time spent: 33 mins     Charise Killian, MD [facility] Pager [phone]  If 7PM-7AM, please contact night-coverage 11/19/2020, 9:01 AM

## 2020-11-19 NOTE — Evaluation (Signed)
Occupational Therapy Evaluation Patient Details Name: Martin Bean MRN: [id number] DOB: 10/19/1929 Today's Date: 11/19/2020    History of Present Illness Martin Bean is a 87 y.o. male with medical history significant for history of CVA, hypertension, chronic diastolic CHF, COPD, HTN, and depression. Brought to ED from [facility] clinic for CHF exacerbation with pleural effusion and BLE swelling and weakness. Per previous documentation pt has had increased SOB for months and worsening in the last few weeks. Recent diagnosis of PNA from COVID-19.   Clinical Impression   Pt seen for OT evaluation this date in setting of acute hospitalization d/t CHF exacerbation. Pt lives in [place] and reports being MOD I with fxl mobility at baseline and needing occasional assist for LB ADLs. Pt presents this date with some edema, decreased fxl activity tolerance and general deconditioning impacting his ability to safely and efficiently perform ADLs/ADL mobility. He currently requires: SETUP for seated UB ADLs, MIN A for LB ADLs and MOD A ADL transfers with RW  (per PT note as pt declines to participate in transfers with OT this date). OT educates pt and dtr re: role of OT in acute setting, importance of OOB activity (pt requires encouragement to participate). Pt's daughter present throughout. Anticipate pt will require f/u occupational therapy services in STR setting as he is currently unable to perform ADLs/ADL transfer and fxl mobility at his reported baseline. Will continue to follow acutely. Pt left in bed with all needs met and nursing student with instructor present to assess pt.     Follow Up Recommendations  SNF    Equipment Recommendations  3 in 1 bedside commode;Tub/shower seat    Recommendations for Other Services       Precautions / Restrictions Precautions Precautions: Fall Restrictions Weight Bearing Restrictions: No      Mobility Bed Mobility Overal bed mobility: Needs Assistance Bed  Mobility: Supine to Sit;Sit to Supine     Supine to sit: HOB elevated;Supervision Sit to supine: Supervision;HOB elevated   General bed mobility comments: Increased time to perform.    Transfers                 General transfer comment: declines to perform today with OT    Balance Overall balance assessment: Needs assistance Sitting-balance support: Bilateral upper extremity supported Sitting balance-Leahy Scale: Fair         Standing balance comment: deferred                           ADL either performed or assessed with clinical judgement   ADL Overall ADL's : Needs assistance/impaired                                       General ADL Comments: SETUP for seated UB ADLs, MIN A for LB ADLs and MOD A ADL transfers  (per PT note as pt declines to participate in transfers with OT this date).     Vision Baseline Vision/History: Wears glasses Wears Glasses: At all times Patient Visual Report: No change from baseline       Perception     Praxis      Pertinent Vitals/Pain Pain Assessment: No/denies pain     Hand Dominance Right   Extremity/Trunk Assessment Upper Extremity Assessment Upper Extremity Assessment: Generalized weakness   Lower Extremity Assessment Lower Extremity Assessment: Generalized weakness  Communication Communication Communication: HOH   Cognition Arousal/Alertness: Awake/alert Behavior During Therapy: WFL for tasks assessed/performed Overall Cognitive Status: Within Functional Limits for tasks assessed                                     General Comments       Exercises Other Exercises Other Exercises: OT educates pt and dtr re: role of OT in acute setting, importance of OOB activity (pt requires encouragement to participate).   Shoulder Instructions      Home Living Family/patient expects to be discharged to:: Assisted living                             Home  Equipment: Wheelchair - manual;Walker - 4 wheels   Additional Comments: Resident of [facility]      Prior Functioning/Environment Level of Independence: Needs assistance        Comments: Pt reports Mod-I for ambulation in his room at [facility] and to meals. Requires intemittent assist from staff with dressing and bathing ADL's        OT Problem List: Decreased strength;Decreased activity tolerance;Impaired balance (sitting and/or standing);Decreased knowledge of use of DME or AE;Cardiopulmonary status limiting activity      OT Treatment/Interventions: Self-care/ADL training;DME and/or AE instruction;Therapeutic activities;Balance training;Therapeutic exercise;Energy conservation;Patient/family education    OT Goals(Current goals can be found in the care plan section) Acute Rehab OT Goals Patient Stated Goal: return to ALF OT Goal Formulation: With patient Time For Goal Achievement: 12/03/20 Potential to Achieve Goals: Good ADL Goals Pt Will Perform Grooming: with min guard assist;with min assist;standing (to complete 1-2 g/h tasks to increase fxl activity tolerance.) Pt Will Perform Lower Body Dressing: with min guard assist;with min assist;sit to/from stand (with AE PRN for EC) Pt Will Transfer to Toilet: with min guard assist;with min assist;ambulating (with LRAD to/from restroom with <2 standing rest breaks) Pt/caregiver will Perform Home Exercise Program: Increased strength;Both right and left upper extremity;With minimal assist  OT Frequency: Min 1X/week   Barriers to D/C:            Co-evaluation              AM-PAC OT "6 Clicks" Daily Activity     Outcome Measure Help from another person eating meals?: None Help from another person taking care of personal grooming?: A Little Help from another person toileting, which includes using toliet, bedpan, or urinal?: A Lot Help from another person bathing (including washing, rinsing, drying)?: A Lot Help from  another person to put on and taking off regular upper body clothing?: A Little Help from another person to put on and taking off regular lower body clothing?: A Little 6 Click Score: 17   End of Session Nurse Communication: Mobility status  Activity Tolerance: Patient tolerated treatment well Patient left: in bed;with call bell/phone within reach;with bed alarm set;with family/visitor present  OT Visit Diagnosis: Unsteadiness on feet (R26.81);Muscle weakness (generalized) (M62.81)                Time: 2035-5974 OT Time Calculation (min): 44 min Charges:  OT General Charges $OT Visit: 1 Visit OT Evaluation $OT Eval Moderate Complexity: 1 Mod OT Treatments $Self Care/Home Management : 23-37 mins $Therapeutic Activity: 8-22 mins  Gerrianne Scale, MS, OTR/L ascom [phone] 11/19/20, 7:57 PM

## 2020-11-19 NOTE — Progress Notes (Signed)
Mobility Specialist - Progress Note   11/19/20 1300  Mobility  Activity Refused mobility  Mobility performed by Mobility specialist    2nd attempt this date. Pt sleeping upon arrival with daughter at bedside. Daughter requests to let pt rest at this time and attempt mobility session later if able. Will re-attempt this date as time permits.    Martin Bean Mobility Specialist 11/19/20, 1:11 PM

## 2020-11-20 ENCOUNTER — Encounter: Payer: Self-pay | Admitting: Internal Medicine

## 2020-11-20 ENCOUNTER — Inpatient Hospital Stay: Payer: Medicare HMO

## 2020-11-20 DIAGNOSIS — E871 Hypo-osmolality and hyponatremia: Secondary | ICD-10-CM | POA: Diagnosis not present

## 2020-11-20 DIAGNOSIS — J181 Lobar pneumonia, unspecified organism: Secondary | ICD-10-CM

## 2020-11-20 DIAGNOSIS — I5033 Acute on chronic diastolic (congestive) heart failure: Secondary | ICD-10-CM | POA: Diagnosis not present

## 2020-11-20 LAB — STREP PNEUMONIAE URINARY ANTIGEN: Strep Pneumo Urinary Antigen: NEGATIVE

## 2020-11-20 LAB — BASIC METABOLIC PANEL
Anion gap: 11 (ref 5–15)
BUN: 24 mg/dL — ABNORMAL HIGH (ref 8–23)
CO2: 29 mmol/L (ref 22–32)
Calcium: 8.4 mg/dL — ABNORMAL LOW (ref 8.9–10.3)
Chloride: 94 mmol/L — ABNORMAL LOW (ref 98–111)
Creatinine, Ser: 0.72 mg/dL (ref 0.61–1.24)
GFR, Estimated: >60 mL/min (ref >60)
Glucose, Bld: 150 mg/dL — ABNORMAL HIGH (ref 70–99)
Potassium: 3.7 mmol/L (ref 3.5–5.1)
Sodium: 134 mmol/L — ABNORMAL LOW (ref 135–145)

## 2020-11-20 LAB — CBC
HCT: 33.2 % — ABNORMAL LOW (ref 39.0–52.0)
Hemoglobin: 11.1 g/dL — ABNORMAL LOW (ref 13.0–17.0)
MCH: 28.4 pg (ref 26.0–34.0)
MCHC: 33.4 g/dL (ref 30.0–36.0)
MCV: 84.9 fL (ref 80.0–100.0)
Platelets: 189 10*3/uL (ref 150–400)
RBC: 3.91 MIL/uL — ABNORMAL LOW (ref 4.22–5.81)
RDW: 16.5 % — ABNORMAL HIGH (ref 11.5–15.5)
WBC: 14.8 10*3/uL — ABNORMAL HIGH (ref 4.0–10.5)
nRBC: 0 % (ref 0.0–0.2)

## 2020-11-20 MED ORDER — FUROSEMIDE 10 MG/ML IJ SOLN
40.0000 mg | Freq: Two times a day (BID) | INTRAMUSCULAR | Status: DC
  Administered 2020-11-20 – 2020-11-21 (×3): 40 mg via INTRAVENOUS
  Filled 2020-11-20 (×3): qty 4

## 2020-11-20 MED ORDER — IPRATROPIUM-ALBUTEROL 0.5-2.5 (3) MG/3ML IN SOLN
3.0000 mL | Freq: Three times a day (TID) | RESPIRATORY_TRACT | Status: DC
  Administered 2020-11-21 (×2): 3 mL via RESPIRATORY_TRACT
  Filled 2020-11-20 (×2): qty 3

## 2020-11-20 MED ORDER — SENNA 8.6 MG PO TABS
1.0000 | ORAL_TABLET | Freq: Every evening | ORAL | Status: DC | PRN
  Filled 2020-11-20: qty 1

## 2020-11-20 MED ORDER — GUAIFENESIN ER 600 MG PO TB12
600.0000 mg | ORAL_TABLET | Freq: Two times a day (BID) | ORAL | Status: DC
  Administered 2020-11-20 – 2020-11-21 (×2): 600 mg via ORAL
  Filled 2020-11-20 (×2): qty 1

## 2020-11-20 MED ORDER — FUROSEMIDE 10 MG/ML IJ SOLN
20.0000 mg | Freq: Once | INTRAMUSCULAR | Status: AC
  Administered 2020-11-20: 20 mg via INTRAVENOUS
  Filled 2020-11-20: qty 2

## 2020-11-20 MED ORDER — SODIUM CHLORIDE 0.9 % IV SOLN
500.0000 mg | INTRAVENOUS | Status: DC
  Administered 2020-11-20 – 2020-11-21 (×2): 500 mg via INTRAVENOUS
  Filled 2020-11-20 (×3): qty 500

## 2020-11-20 NOTE — Progress Notes (Signed)
PROGRESS NOTE    Martin Bean  JSE:[id number] DOB: [birth year] DOA: 12-14-2020 PCP: Mortimer Fries, PA   Assessment & Plan:   Principal Problem:   Acute on chronic diastolic heart failure (HCC) Active Problems:   Generalized weakness   SOB (shortness of breath)   Acute hyponatremia   Prostate enlargement   COPD (chronic obstructive pulmonary disease) (HCC)   Acuteon chronic diastolic heart failure: continue on IV lasix. Monitor I/Os and daily weights. Neg approx 1.8L    Hypokalemia: WNL today   Generalized weakness:PT recs SNF   Hypervolemic hyponatremia:stable from day prior  PAF: not on any rate controlling meds. Not on anticoagulation secondary to high fall risk. Continue on aspirin   COPD: started on azithromycin. Continue on bronchodilators and encourage incentive spirometry   Possible pneumonia: as per repeat CXR. Stared on azithromycin. Continue on bronchodilators and encourage incentive spirometry. Legionella, strep ordered   Leukocytosis: likely secondary to infection. Continue on IV abxs   BPH: continue on flomax   Hx of chronic orthostatic hypotension:continue on home dose of midodrine   Normocytic anemia: H&H are stable. No indication for a transfusion currently   Depression: severity unknown. Continue on home dose of sertraline   DVT prophylaxis: lovenox  Code Status: DNR Family Communication: discussed pt's care w/ pt's daughter, [name] and answered her questions. [name] requests hospice. Will consult hospice tomorrow  Disposition Plan: unclear, possible home w/ hospice   Level of care: Med-Surg   Status is: Inpatient  Remains inpatient appropriate because:Unsafe d/c plan, IV treatments appropriate due to intensity of illness or inability to take PO and Inpatient level of care appropriate due to severity of illness, will consult hospice tomorrow    Dispo: The patient is from: Home              Anticipated d/c is to: Home              Patient  currently is not medically stable to d/c.   Difficult to place patient No        Consultants:      Procedures:    Antimicrobials:    Subjective: Pt c/o increased shortness of breath today   Objective: Vitals:   11/20/20 0033 11/20/20 0038 11/20/20 0048 11/20/20 0328  BP: 122/79   117/65  Pulse: (!) 112   92  Resp: (!) 35 (!) 39  (!) 30  Temp:    98 F (36.7 C)  TempSrc:    Oral  SpO2: 90%  92% 93%  Weight:      Height:        Intake/Output Summary (Last 24 hours) at 11/20/2020 0806 Last data filed at 11/20/2020 0300 Gross per 24 hour  Intake 240 ml  Output 2050 ml  Net -1810 ml   Filed Weights   Dec 14, 2020 1137  Weight: 64.4 kg    Examination:  General exam: Appears uncomfortable. Frail appearing  Respiratory system: course breath sounds b/l, R>L. Rales  Cardiovascular system: S1/S2+. No rubs or gallops  Gastrointestinal system: Abd is soft, NT, ND & hypoactive bowel sounds  Central nervous system: Alert and oriented. Moves all 4 extremities   Psychiatry: Judgement and insight appear normal. Flat mood and affect      Data Reviewed: I have personally reviewed following labs and imaging studies  CBC: Recent Labs  Lab 2020-12-14 1143 11/18/20 0613 11/20/20 0626  WBC 9.9 11.0* 14.8*  HGB 11.0* 10.9* 11.1*  HCT 32.8* 32.4* 33.2*  MCV 84.1 84.2  84.9  PLT 175 176 189   Basic Metabolic Panel: Recent Labs  Lab 11/07/2020 1143 10/19/2020 1439 11/18/20 0613 11/19/20 0348 11/20/20 0626  NA 132*  --  133* 134* 134*  K 5.2*  --  3.5 3.1* 3.7  CL 97*  --  97* 95* 94*  CO2 26  --  31 30 29   GLUCOSE 114*  --  123* 133* 150*  BUN 15  --  12 18 24*  CREATININE 0.82  --  0.68 0.61 0.72  CALCIUM 8.9  --  8.5* 8.2* 8.4*  MG  --  1.9 1.9  --   --   PHOS  --   --  4.0  --   --    GFR: Estimated Creatinine Clearance: 58.1 mL/min (by C-G formula based on SCr of 0.72 mg/dL). Liver Function Tests: No results for input(s): AST, ALT, ALKPHOS, BILITOT, PROT,  ALBUMIN in the last 168 hours. No results for input(s): LIPASE, AMYLASE in the last 168 hours. No results for input(s): AMMONIA in the last 168 hours. Coagulation Profile: No results for input(s): INR, PROTIME in the last 168 hours. Cardiac Enzymes: No results for input(s): CKTOTAL, CKMB, CKMBINDEX, TROPONINI in the last 168 hours. BNP (last 3 results) No results for input(s): PROBNP in the last 8760 hours. HbA1C: No results for input(s): HGBA1C in the last 72 hours. CBG: No results for input(s): GLUCAP in the last 168 hours. Lipid Profile: No results for input(s): CHOL, HDL, LDLCALC, TRIG, CHOLHDL, LDLDIRECT in the last 72 hours. Thyroid Function Tests: Recent Labs    11/18/20 0613  TSH 3.099   Anemia Panel: No results for input(s): VITAMINB12, FOLATE, FERRITIN, TIBC, IRON, RETICCTPCT in the last 72 hours. Sepsis Labs: No results for input(s): PROCALCITON, LATICACIDVEN in the last 168 hours.  No results found for this or any previous visit (from the past 240 hour(s)).       Radiology Studies: DG Chest Port 1 View  Result Date: 11/20/2020 CLINICAL DATA:  Chest crackles EXAM: PORTABLE CHEST 1 VIEW COMPARISON:  11/16/2020 FINDINGS: There is hyperinflation of the lungs compatible with COPD. Heart is normal size. Increased markings within the lungs bilaterally, most pronounced throughout the right lung. No effusions. No acute bony abnormality. IMPRESSION: COPD. Increased markings throughout the lungs, particularly on the right. Cannot exclude infection/atypical infection. Electronically Signed   By: 11/19/2020 M.D.   On: 11/20/2020 01:33   ECHOCARDIOGRAM COMPLETE  Result Date: 11/18/2020    ECHOCARDIOGRAM REPORT   Patient Name:   TAMEL ABEL Date of Exam: 11/18/2020 Medical Rec #:  01/[id number]       Height:       71.0 in Accession #:    [id number]      Weight:       142.0 lb Date of Birth:  02/26/1930       BSA:          1.823 m Patient Age:    87 years        BP:           111/61  mmHg Patient Gender: M               HR:           94 bpm. Exam Location:  [facility] Procedure: 2D Echo, Cardiac Doppler and Color Doppler Indications:     CHF I50.9  History:         Patient has prior history of Echocardiogram examinations, most  recent 07/18/2020. CHF, COPD; Risk Factors:Hypertension.                  Pneumonia.  Sonographer:     Cristela Blue RDCS (AE) Referring Phys:  [id number] Angie Fava Diagnosing Phys: Marcina Millard MD IMPRESSIONS  1. Left ventricular ejection fraction, by estimation, is 60 to 65%. The left ventricle has normal function. The left ventricle has no regional wall motion abnormalities. Left ventricular diastolic parameters are indeterminate.  2. Right ventricular systolic function is normal. The right ventricular size is normal.  3. The mitral valve is normal in structure. Mild mitral valve regurgitation. No evidence of mitral stenosis.  4. The aortic valve is normal in structure. Aortic valve regurgitation is not visualized. Mild aortic valve sclerosis is present, with no evidence of aortic valve stenosis.  5. The inferior vena cava is normal in size with greater than 50% respiratory variability, suggesting right atrial pressure of 3 mmHg. FINDINGS  Left Ventricle: Left ventricular ejection fraction, by estimation, is 60 to 65%. The left ventricle has normal function. The left ventricle has no regional wall motion abnormalities. The left ventricular internal cavity size was normal in size. There is  no left ventricular hypertrophy. Left ventricular diastolic parameters are indeterminate. Right Ventricle: The right ventricular size is normal. No increase in right ventricular wall thickness. Right ventricular systolic function is normal. Left Atrium: Left atrial size was normal in size. Right Atrium: Right atrial size was normal in size. Pericardium: There is no evidence of pericardial effusion. Mitral Valve: The mitral valve is normal in structure. Mild mitral  valve regurgitation. No evidence of mitral valve stenosis. Tricuspid Valve: The tricuspid valve is normal in structure. Tricuspid valve regurgitation is mild . No evidence of tricuspid stenosis. Aortic Valve: The aortic valve is normal in structure. Aortic valve regurgitation is not visualized. Mild aortic valve sclerosis is present, with no evidence of aortic valve stenosis. Aortic valve mean gradient measures 2.0 mmHg. Aortic valve peak gradient measures 2.6 mmHg. Aortic valve area, by VTI measures 2.13 cm. Pulmonic Valve: The pulmonic valve was normal in structure. Pulmonic valve regurgitation is not visualized. No evidence of pulmonic stenosis. Aorta: The aortic root is normal in size and structure. Venous: The inferior vena cava is normal in size with greater than 50% respiratory variability, suggesting right atrial pressure of 3 mmHg. IAS/Shunts: No atrial level shunt detected by color flow Doppler.  LEFT VENTRICLE PLAX 2D LVIDd:         3.66 cm LVIDs:         1.90 cm LV PW:         1.30 cm LV IVS:        0.88 cm LVOT diam:     2.00 cm LV SV:         28 LV SV Index:   15 LVOT Area:     3.14 cm  RIGHT VENTRICLE RV Basal diam:  2.64 cm RV S prime:     9.03 cm/s TAPSE (M-mode): 3.4 cm LEFT ATRIUM             Index       RIGHT ATRIUM           Index LA diam:        3.80 cm 2.08 cm/m  RA Area:     17.60 cm LA Vol (A2C):   43.8 ml 24.03 ml/m RA Volume:   41.60 ml  22.82 ml/m LA Vol (A4C):   16.3 ml  8.94 ml/m LA Biplane Vol: 27.9 ml 15.30 ml/m  AORTIC VALVE                   PULMONIC VALVE AV Area (Vmax):    2.17 cm    PV Vmax:        0.69 m/s AV Area (Vmean):   1.76 cm    PV Peak grad:   1.9 mmHg AV Area (VTI):     2.13 cm    RVOT Peak grad: 2 mmHg AV Vmax:           80.30 cm/s AV Vmean:          63.200 cm/s AV VTI:            0.131 m AV Peak Grad:      2.6 mmHg AV Mean Grad:      2.0 mmHg LVOT Vmax:         55.50 cm/s LVOT Vmean:        35.500 cm/s LVOT VTI:          0.089 m LVOT/AV VTI ratio: 0.68   AORTA Ao Root diam: 3.30 cm MITRAL VALVE               TRICUSPID VALVE MV Area (PHT): 3.36 cm    TR Peak grad:   23.4 mmHg MV Decel Time: 226 msec    TR Vmax:        242.00 cm/s MV E velocity: 78.80 cm/s                            SHUNTS                            Systemic VTI:  0.09 m                            Systemic Diam: 2.00 cm Marcina MillardAlexander Paraschos MD Electronically signed by Marcina MillardAlexander Paraschos MD Signature Date/Time: 11/18/2020/3:10:11 PM    Final         Scheduled Meds: . aspirin EC  81 mg Oral Daily  . enoxaparin (LOVENOX) injection  40 mg Subcutaneous Q24H  . feeding supplement  237 mL Oral TID BM  . furosemide  20 mg Intravenous BID  . guaiFENesin  600 mg Oral Daily  . ipratropium-albuterol  3 mL Nebulization Q6H  . midodrine  5 mg Oral TID WC  . mirtazapine  7.5 mg Oral QHS  . multivitamin with minerals  1 tablet Oral Daily  . pantoprazole  40 mg Oral Daily  . sertraline  25 mg Oral Daily  . tamsulosin  0.4 mg Oral QHS   Continuous Infusions:   LOS: 3 days    Time spent: 35 mins     Charise KillianJamiese M Dolorez Jeffrey, MD [facility] Pager [phone]  If 7PM-7AM, please contact night-coverage 11/20/2020, 8:06 AM

## 2020-11-21 DIAGNOSIS — R531 Weakness: Secondary | ICD-10-CM | POA: Diagnosis not present

## 2020-11-21 DIAGNOSIS — I5033 Acute on chronic diastolic (congestive) heart failure: Secondary | ICD-10-CM | POA: Diagnosis not present

## 2020-11-21 DIAGNOSIS — J439 Emphysema, unspecified: Secondary | ICD-10-CM | POA: Diagnosis not present

## 2020-11-21 LAB — URINALYSIS, ROUTINE W REFLEX MICROSCOPIC
Bilirubin Urine: NEGATIVE
Glucose, UA: NEGATIVE mg/dL
Hgb urine dipstick: NEGATIVE
Ketones, ur: NEGATIVE mg/dL
Leukocytes,Ua: NEGATIVE
Nitrite: NEGATIVE
Protein, ur: NEGATIVE mg/dL
Specific Gravity, Urine: 1.009 (ref 1.005–1.030)
pH: 5 (ref 5.0–8.0)

## 2020-11-21 LAB — CBC
HCT: 33.2 % — ABNORMAL LOW (ref 39.0–52.0)
Hemoglobin: 11 g/dL — ABNORMAL LOW (ref 13.0–17.0)
MCH: 28.5 pg (ref 26.0–34.0)
MCHC: 33.1 g/dL (ref 30.0–36.0)
MCV: 86 fL (ref 80.0–100.0)
Platelets: 182 10*3/uL (ref 150–400)
RBC: 3.86 MIL/uL — ABNORMAL LOW (ref 4.22–5.81)
RDW: 16.6 % — ABNORMAL HIGH (ref 11.5–15.5)
WBC: 12.8 10*3/uL — ABNORMAL HIGH (ref 4.0–10.5)
nRBC: 0 % (ref 0.0–0.2)

## 2020-11-21 LAB — BASIC METABOLIC PANEL
Anion gap: 10 (ref 5–15)
BUN: 27 mg/dL — ABNORMAL HIGH (ref 8–23)
CO2: 31 mmol/L (ref 22–32)
Calcium: 8.4 mg/dL — ABNORMAL LOW (ref 8.9–10.3)
Chloride: 94 mmol/L — ABNORMAL LOW (ref 98–111)
Creatinine, Ser: 0.78 mg/dL (ref 0.61–1.24)
GFR, Estimated: >60 mL/min (ref >60)
Glucose, Bld: 135 mg/dL — ABNORMAL HIGH (ref 70–99)
Potassium: 3.4 mmol/L — ABNORMAL LOW (ref 3.5–5.1)
Sodium: 135 mmol/L (ref 135–145)

## 2020-11-21 LAB — SODIUM, URINE, RANDOM: Sodium, Ur: 104 mmol/L

## 2020-11-21 LAB — OSMOLALITY, URINE: Osmolality, Ur: 389 mOsm/kg (ref 300–900)

## 2020-11-21 MED ORDER — POTASSIUM CHLORIDE CRYS ER 20 MEQ PO TBCR
20.0000 meq | EXTENDED_RELEASE_TABLET | Freq: Once | ORAL | Status: AC
  Administered 2020-11-21: 20 meq via ORAL
  Filled 2020-11-21: qty 1

## 2020-11-22 LAB — LEGIONELLA PNEUMOPHILA SEROGP 1 UR AG: L. pneumophila Serogp 1 Ur Ag: NEGATIVE

## 2020-12-13 ENCOUNTER — Ambulatory Visit: Payer: Medicare HMO | Admitting: Family

## 2020-12-18 NOTE — Progress Notes (Signed)
[facility] Room 254 [facility]) Hospital Liaison RN note:  Received request from Dr. Mayford Knife for family interest in Hospice Home. Chart reviewed and eligibility is pending. Spoke with daughter, [name], to confirm interest and explain services. She verbalized understanding and will complete paperwork if approved and a room becomes available. Hospice Home does not have a room to offer today. Hospital care team is aware.  Please call with any hospice related questions or concerns.  Thank you for the opportunity to participate in this patient's care.  Cyndra Numbers, RN [facility] Liaison [phone]

## 2020-12-18 NOTE — Progress Notes (Signed)
PROGRESS NOTE    HPI was taken from Dr. Arlean HoppingHowerter: Martin HaverJasper L Bean is a 87 y.o. male with medical history significant for COPD, chronic diastolic heart failure, paroxysmal atrial fibrillation, BPH, orthostatic hypotension, who is admitted to [facility] on 10/19/2020 with acute on chronic diastolic heart failure after [facility] complaining of shortness of breath.   The following history is provided by the patient as well as my discussions with the patient's daughter, who is present at bedside, in addition to my discussions with the emergency department physician and via chart review.  The patient confirms that he was diagnosed with COVID-19 pneumonia in January 2020, with a positive PCR result on 09/08/2020.  He reports that he subsequently developed a secondary bacterial pneumonia for which she received antibiotic therapy following which is associated shortness of breath and cough improved.  However, over the last 1 to 2 weeks, the patient reports progressive shortness of breath associated with worsening of bilateral lower extremity edema.  He follows closely with Dr. Karna ChristmasAleskerov as his outpatient pulmonologist, with most recent appointment occurring yesterday, 11/16/2020, at which time Dr. Karna ChristmasAleskerov clinically felt that the patient's recent progressive shortness of breath is most consistent with an acute heart failure exacerbation as opposed to representing an exacerbation of his COPD.  At that time, [facility] that the [facility] ED for further evaluation of suspected acute on chronic heart failure, although the patient refused at that time.  At that time, Dr.Aleskerov Change the patient's home diuretic regimen from Lasix 20 mg p.o. daily to torsemide 20 mg p.o. daily, although the patient has not yet had the opportunity to start on his torsemide. Over the next day following this outpatient appt in pulmonary clinic, in the setting of  further progression of his shortness of breath, the patient ultimately decided to present [facility] ED for further evaluation of this shortness of breath.  Per chart review, the patient has a documented history of orthostatic hypotension for which she is chronically on midodrine.  There is also documentation of a history of soft blood pressures in response to diuresis efforts.   He reports associated orthopnea, but denies any associated PND.  Denies any associated chest pain, diaphoresis, palpitations, nausea, vomiting.  Not associate with any cough, wheezing, hemoptysis, or calf tenderness.  No recent trauma, travel, surgical procedures, no recent melena or hematochezia.  Denies any recent subjective fever, chills, rigors, or generalized myalgias.  No recent headache, neck stiffness, rhinitis, rhinorrhea, sore throat, abdominal pain, diarrhea, or rash.  He also denies any recent dysuria, gross hematuria, or change in urinary urgency/frequency.  Per chart review, echocardiogram in December 2018 showed grade 1 diastolic dysfunction, with most recent echocardiogram, which was performed in November 2021, showing LVEF 60 to 65%, no focal wall motion abnormalities, normal left ventricular cavity size, no LVH, normal diastolic parameters, and no evidence of significant valvular pathology.  The patient confirms that he is a former smoker, having completely quit smoking in 2015.  His outpatient respiratory regimen consists of scheduled albuterol nebulizer treatments as well as as needed albuterol inhaler.  No baseline supplemental oxygen requirements.  Hospital course from Dr. Mayford KnifeWilliams 4/2-04/30/2022: Pt presented w/ shortness of breath likely secondary to CHF exacerbation. Pt has been treated w/ IV lasix. Of note, pt was found to have likely pneumonia as well from repeat CXR and pt was started on abxs. Also, pt's daughter requested that hospice evaluate the pt. Hospice home does  not have a room today to offer,  possibly tomorrow.   Martin Bean  TKZ:[id number] DOB: 1930-04-06 DOA: 10/25/2020 PCP: Martin Fries, PA   Assessment & Plan:   Principal Problem:   Acute on chronic diastolic heart failure (HCC) Active Problems:   Generalized weakness   SOB (shortness of breath)   Acute hyponatremia   Prostate enlargement   COPD (chronic obstructive pulmonary disease) (HCC)   Acuteon chronic diastolic heart failure: continue on IV lasix. Monitor I/Os and daily weights. Neg approx 1.8L    Hypokalemia: WNL today   Generalized weakness:PT recs SNF   Hypervolemic hyponatremia:resolved   PAF: not on any rate controlling meds. Not on anticoagulation secondary to high fall risk. Continue on aspirin   COPD: started on azithromycin. Continue on bronchodilators and encourage incentive spirometry   Possible pneumonia: as per repeat CXR. Stared on azithromycin. Continue on bronchodilators and encourage incentive spirometry. Legionella, strep ordered   Leukocytosis: likely secondary to infection. Continue on IV abxs   BPH: continue on flomax   Hx of chronic orthostatic hypotension:continue on home dose of midodrine   Normocytic anemia: H&H are stable. No indication for a transfusion currently   Depression: severity unknown. Continue on home dose of sertraline   DVT prophylaxis: lovenox  Code Status: DNR Family Communication: discussed pt's care w/ pt's daughter, [name] and answered her questions. [name] requested hospice. Will likely go to hospice home tomorrow  Disposition Plan: hospice home   Level of care: Med-Surg   Status is: Inpatient  Remains inpatient appropriate because:Unsafe d/c plan, IV treatments appropriate due to intensity of illness or inability to take PO and Inpatient level of care appropriate due to severity of illness, waiting on hospice bed   Dispo: The patient is from: Home              Anticipated d/c is to: hospice home               Patient currently medically  stable for d/c   Difficult to place patient No        Consultants:      Procedures:    Antimicrobials:    Subjective: Pt c/o shortness of breath   Objective: Vitals:   11/20/20 1231 11/20/20 2022 11/20/20 2135 11/23/20 0457  BP: (!) 102/58  118/64 107/63  Pulse: 94  (!) 107 85  Resp: 17  18 17   Temp: (!) 97.3 F (36.3 C)  97.7 F (36.5 C) (!) 97.5 F (36.4 C)  TempSrc: Oral  Oral Oral  SpO2: 96% 95% 95% 94%  Weight:      Height:        Intake/Output Summary (Last 24 hours) at 11/23/20 0808 Last data filed at 11/20/2020 2136 Gross per 24 hour  Intake 410 ml  Output 1050 ml  Net -640 ml   Filed Weights   11/07/2020 1137 11/20/20 0900  Weight: 64.4 kg 61.2 kg    Examination:  General exam: Appears comfortable. Frail appearing  Respiratory system: diminished breath sounds b/l Cardiovascular system: S1 & S2+. No rubs or gallops   Gastrointestinal system: Abd is soft, NT, ND & hypoactive bowel sounds  Central nervous system: Alert and oriented. Moves all extremities  Psychiatry: Judgement and insight appear normal. Flat mood and affect      Data Reviewed: I have personally reviewed following labs and imaging studies  CBC: Recent Labs  Lab 11/06/2020 1143 11/18/20 0613 11/20/20 0626 11/23/20 0527  WBC 9.9 11.0* 14.8* 12.8*  HGB 11.0* 10.9* 11.1* 11.0*  HCT 32.8* 32.4* 33.2* 33.2*  MCV 84.1 84.2 84.9 86.0  PLT 175 176 189 182   Basic Metabolic Panel: Recent Labs  Lab 11/04/2020 1143 11/15/2020 1439 11/18/20 0613 11/19/20 0348 11/20/20 0626 2020/12/01 0527  NA 132*  --  133* 134* 134* 135  K 5.2*  --  3.5 3.1* 3.7 3.4*  CL 97*  --  97* 95* 94* 94*  CO2 26  --  31 30 29 31   GLUCOSE 114*  --  123* 133* 150* 135*  BUN 15  --  12 18 24* 27*  CREATININE 0.82  --  0.68 0.61 0.72 0.78  CALCIUM 8.9  --  8.5* 8.2* 8.4* 8.4*  MG  --  1.9 1.9  --   --   --   PHOS  --   --  4.0  --   --   --    GFR: Estimated Creatinine Clearance: 55.3 mL/min (by C-G  formula based on SCr of 0.78 mg/dL). Liver Function Tests: No results for input(s): AST, ALT, ALKPHOS, BILITOT, PROT, ALBUMIN in the last 168 hours. No results for input(s): LIPASE, AMYLASE in the last 168 hours. No results for input(s): AMMONIA in the last 168 hours. Coagulation Profile: No results for input(s): INR, PROTIME in the last 168 hours. Cardiac Enzymes: No results for input(s): CKTOTAL, CKMB, CKMBINDEX, TROPONINI in the last 168 hours. BNP (last 3 results) No results for input(s): PROBNP in the last 8760 hours. HbA1C: No results for input(s): HGBA1C in the last 72 hours. CBG: No results for input(s): GLUCAP in the last 168 hours. Lipid Profile: No results for input(s): CHOL, HDL, LDLCALC, TRIG, CHOLHDL, LDLDIRECT in the last 72 hours. Thyroid Function Tests: No results for input(s): TSH, T4TOTAL, FREET4, T3FREE, THYROIDAB in the last 72 hours. Anemia Panel: No results for input(s): VITAMINB12, FOLATE, FERRITIN, TIBC, IRON, RETICCTPCT in the last 72 hours. Sepsis Labs: No results for input(s): PROCALCITON, LATICACIDVEN in the last 168 hours.  No results found for this or any previous visit (from the past 240 hour(s)).       Radiology Studies: DG Chest Port 1 View  Result Date: 11/20/2020 CLINICAL DATA:  Chest crackles EXAM: PORTABLE CHEST 1 VIEW COMPARISON:  11/07/2020 FINDINGS: There is hyperinflation of the lungs compatible with COPD. Heart is normal size. Increased markings within the lungs bilaterally, most pronounced throughout the right lung. No effusions. No acute bony abnormality. IMPRESSION: COPD. Increased markings throughout the lungs, particularly on the right. Cannot exclude infection/atypical infection. Electronically Signed   By: 11/19/2020 M.D.   On: 11/20/2020 01:33        Scheduled Meds: . aspirin EC  81 mg Oral Daily  . enoxaparin (LOVENOX) injection  40 mg Subcutaneous Q24H  . feeding supplement  237 mL Oral TID BM  . furosemide  40 mg  Intravenous BID  . guaiFENesin  600 mg Oral BID  . ipratropium-albuterol  3 mL Nebulization TID  . midodrine  5 mg Oral TID WC  . mirtazapine  7.5 mg Oral QHS  . multivitamin with minerals  1 tablet Oral Daily  . pantoprazole  40 mg Oral Daily  . sertraline  25 mg Oral Daily  . tamsulosin  0.4 mg Oral QHS   Continuous Infusions: . azithromycin Stopped (11/20/20 1026)     LOS: 4 days    Time spent: 32 mins     01/20/21, MD [facility] Pager [phone]  If  7PM-7AM, please contact night-coverage 16-Dec-2020, 8:08 AM

## 2020-12-18 NOTE — Progress Notes (Signed)
Pt requiring linen change at the beginning of shift.  Family said goodnight and left.  BSmith RN and [employer] in to [employer] and external cath.  As we finished turning pt, pt became apneac and unresponsive.  Assessed pt and attempted to get a set of vital signs and alerted Charge Nurse Ruby.  MD notified.  Time of Death Dec 07, 2009.

## 2020-12-18 NOTE — Progress Notes (Signed)
PT Cancellation Note  Patient Details Name: Martin Bean MRN: [id number] DOB: 10/16/1929   Cancelled Treatment:    Reason Eval/Treat Not Completed: Other (comment). PR for therapy at this time. Will attempt as able at a later time.    Delphia Grates. Fairly IV, PT, DPT Physical Therapist- [place]  [facility]  2020/12/06, 10:15 AM

## 2020-12-18 NOTE — Discharge Summary (Signed)
Death Summary  Martin Bean Bean:[id number] DOB: Jan 12, 1930 DOA: 12/02/2020  PCP: Mortimer Fries, PA   Admit date: Nov 21, 2020 Date of Death: 27-Nov-2020  Final Diagnoses:  Principal Problem:   Acute on Martin Bean Bean (HCC) Active Problems:   Generalized weakness   SOB (shortness of breath)   Acute hyponatremia   Prostate enlargement   COPD (Martin Bean obstructive pulmonary disease) (HCC)  HPI was taken from Dr. Arlean Bean: Martin Bean Bean a 87 Bean medical history significant Bean, Martin Bean Bean,Martin Bean Bean,Martin Bean Bean, Martin Bean Bean,who is admitted to [facility] on2022/04/04with acute on Martin Bean diastolic heart failureafter presenting from home to [facility] ED complaining of shortness of breath.  The following history is provided by the patient as well as my discussions with the patient's daughter, who is present at bedside,in addition to my discussions with the emergency department physician and via chart review.  The patient confirms that he was diagnosed with COVID-19 pneumonia in January 2020, with a positive PCR result on 09/08/2020. He reports that he subsequently developed a secondary bacterial pneumonia for which she received antibiotic therapy following which is associated shortness of breath and cough improved.However, over the last 1 to 2 weeks, the patient reports progressive shortness of breath associated with worsening of bilateral lower extremity edema. He follows closely with Dr. Lawernce Bean his outpatient pulmonologist,with most recent appointment occurring yesterday, 11/16/2020, at which time [facility] clinically felt that the patient's recent progressive shortness of breath is most consistent with an acute heart Bean exacerbation as opposed to representing an exacerbation of his COPD. At that time, MartinAleskerovRecommended that the patient present to [facility] ED for further  evaluation of suspected acute on Martin Bean heart Bean,although the patient refused at that time.At that time, MartinAleskerovChange the patient's home diuretic regimen from Lasix 20 mg p.o. daily to torsemide 20 mg p.o. daily, although the patient has not yet had the opportunity to start on his torsemide.Over the next dayfollowing this outpatient appt inpulmonary clinic, in the setting of further progression of his shortness of breath, the patient ultimately decided to present to [facility] ED for further evaluation of this shortness of breath.  Per chart review, the patient has a documented history of Martin Bean Bean for which she is chronically on midodrine.There is also documentation of a history of soft blood pressures in response to diuresis efforts.  He reports associated orthopnea, but denies any associated PND. Denies any associated chest pain, diaphoresis, palpitations, nausea, vomiting.Not associate with any cough, wheezing, hemoptysis, or calf tenderness. No recent trauma, travel, surgical procedures, no recent melena or hematochezia. Denies any recent subjective fever, chills, rigors, or generalized myalgias. No recent headache, neck stiffness, rhinitis, rhinorrhea, sore throat, abdominal pain, diarrhea, or rash. He also denies any recent dysuria, gross hematuria, or change in urinary urgency/frequency.  Per chart review, echocardiogram in December 2018 showed grade 1 diastolic dysfunction, with most recent echocardiogram, which was performed in November 2021, showing LVEF 60 to 65%, no focal wall motion abnormalities, normal left ventricular cavity size, no LVH, normal diastolic parameters, and no evidence of significant valvular pathology.  The patient confirms that he is a former smoker, having completely quit smoking in 2015. His outpatient respiratory regimen consists of scheduled albuterol nebulizer treatments as well as as needed albuterol inhaler.No baseline  supplemental oxygen requirements.  Hospital course from Dr. Mayford Bean 4/2-Nov 25, 2020: Pt presented w/ shortness of breath likely secondary to CHF exacerbation. Pt has been treated w/ IV lasix. Of note, pt was found to have likely  pneumonia as well from repeat CXR and pt was started on abxs. Also, pt's daughter requested that hospice evaluate the pt. Hospice home does not have a room today to offer, possibly tomorrow. Unfortunately, pt passed away at 12-25-09 on Dec 19, 2020.     Hospital Course:  Acuteon Martin Bean Bean: continue on IV lasix. Monitor I/Os and daily weights. Neg approx 1.8L    Hypokalemia: WNL today   Generalized weakness:PT recs SNF   Hypervolemic hyponatremia:resolved   PAF: not on any rate controlling meds. Not on anticoagulation secondary to high fall risk. Continue on aspirin   COPD: started on azithromycin. Continue on bronchodilators and encourage incentive spirometry   Possible pneumonia: as per repeat CXR. Stared on azithromycin. Continue on bronchodilators and encourage incentive spirometry. Legionella, strep ordered   Leukocytosis: likely secondary to infection. Continue on IV abxs   Martin Bean Bean: continue on flomax   Hx of Martin Bean Martin Bean Bean:continue on home dose of midodrine   Normocytic anemia: H&H are stable. No indication for a transfusion currently   Depression: severity unknown. Continue on home dose of sertraline    Time of death 12/25/2009 Time: approx 31 mins   Signed:  Charise Bean  [facility] 11/23/2020, 3:09 PM

## 2020-12-18 NOTE — Care Management Important Message (Signed)
Important Message  Patient Details  Name: Martin Bean MRN: [id number] Date of Birth: Jan 01, 1930   Medicare Important Message Given:  Other (see comment)  Per chart, pending transfer to Hospice Home once bed available.  Medicare IM withheld at this time.   [name] 11/29/2020, 3:06 PM

## 2020-12-18 NOTE — Progress Notes (Signed)
[place] [facility] [employer] referral number  [phone] (Full release)

## 2020-12-18 NOTE — Plan of Care (Signed)
?  Problem: Clinical Measurements: ?Goal: Cardiovascular complication will be avoided ?Outcome: Progressing ?  ?Problem: Nutrition: ?Goal: Adequate nutrition will be maintained ?Outcome: Progressing ?  ?Problem: Elimination: ?Goal: Will not experience complications related to bowel motility ?Outcome: Progressing ?  ?

## 2020-12-18 NOTE — Progress Notes (Signed)
pts daughter [name] notified of death.

## 2020-12-18 DEATH — deceased

## 2021-06-08 NOTE — Progress Notes (Signed)
Patient referred for right heart cath by Dr. Meredeth Ide Patient thought to have pulm hypertension on echocardiogram Severe dyspnea COPD hypoxemia Exam suggestive of pulmonary fibrosis hypoxemia diffuse crackles Heart regular rhythm Normal exam is benign Overall physical exam was benign except for shortness of breath dyspnea lung crackles  Laboratories are pending  Plan Right heart cath today for further evaluation of pulm hypertension
